# Patient Record
Sex: Male | Born: 1950
Health system: Southern US, Community
[De-identification: ages and names within clinical notes are randomized; demographics above are authoritative.]

## PROBLEM LIST (undated history)

## (undated) DIAGNOSIS — E7800 Pure hypercholesterolemia, unspecified: Secondary | ICD-10-CM

## (undated) DIAGNOSIS — Z8739 Personal history of other diseases of the musculoskeletal system and connective tissue: Secondary | ICD-10-CM

## (undated) DIAGNOSIS — B019 Varicella without complication: Secondary | ICD-10-CM

## (undated) DIAGNOSIS — K219 Gastro-esophageal reflux disease without esophagitis: Secondary | ICD-10-CM

## (undated) DIAGNOSIS — Z872 Personal history of diseases of the skin and subcutaneous tissue: Secondary | ICD-10-CM

## (undated) DIAGNOSIS — M1990 Unspecified osteoarthritis, unspecified site: Secondary | ICD-10-CM

## (undated) DIAGNOSIS — C4490 Unspecified malignant neoplasm of skin, unspecified: Secondary | ICD-10-CM

## (undated) DIAGNOSIS — I2510 Atherosclerotic heart disease of native coronary artery without angina pectoris: Secondary | ICD-10-CM

## (undated) DIAGNOSIS — L4050 Arthropathic psoriasis, unspecified: Secondary | ICD-10-CM

## (undated) DIAGNOSIS — G4733 Obstructive sleep apnea (adult) (pediatric): Secondary | ICD-10-CM

## (undated) DIAGNOSIS — I10 Essential (primary) hypertension: Secondary | ICD-10-CM

## (undated) HISTORY — DX: Gastro-esophageal reflux disease without esophagitis: K21.9

## (undated) HISTORY — DX: Obstructive sleep apnea (adult) (pediatric): G47.33

## (undated) HISTORY — DX: Unspecified malignant neoplasm of skin, unspecified: C44.90

## (undated) HISTORY — PX: OTHER SURGICAL HISTORY: SHX169

## (undated) HISTORY — DX: Unspecified osteoarthritis, unspecified site: M19.90

## (undated) HISTORY — DX: Pure hypercholesterolemia, unspecified: E78.00

## (undated) HISTORY — DX: Varicella without complication: B01.9

## (undated) HISTORY — DX: Personal history of other diseases of the musculoskeletal system and connective tissue: Z87.39

## (undated) HISTORY — DX: Atherosclerotic heart disease of native coronary artery without angina pectoris: I25.10

## (undated) HISTORY — DX: Personal history of diseases of the skin and subcutaneous tissue: Z87.2

## (undated) HISTORY — PX: COLONOSCOPY: SHX174

---
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
## 2003-09-21 HISTORY — PX: CORONARY ARTERY BYPASS GRAFT: SHX141

## 2003-09-21 HISTORY — PX: CARDIAC CATHETERIZATION: SHX172

## 2003-10-05 ENCOUNTER — Ambulatory Visit (HOSPITAL_COMMUNITY): Admission: RE | Admit: 2003-10-05 | Discharge: 2003-10-05 | Payer: Self-pay | Admitting: Cardiology

## 2003-10-12 ENCOUNTER — Ambulatory Visit (HOSPITAL_COMMUNITY): Admission: RE | Admit: 2003-10-12 | Discharge: 2003-10-12 | Payer: Self-pay | Admitting: Surgery

## 2003-10-21 ENCOUNTER — Inpatient Hospital Stay (HOSPITAL_COMMUNITY): Admission: RE | Admit: 2003-10-21 | Discharge: 2003-10-25 | Payer: Self-pay | Admitting: Surgery

## 2003-11-15 ENCOUNTER — Encounter (HOSPITAL_COMMUNITY): Admission: RE | Admit: 2003-11-15 | Discharge: 2004-02-13 | Payer: Self-pay | Admitting: Cardiology

## 2004-02-14 ENCOUNTER — Encounter (HOSPITAL_COMMUNITY): Admission: RE | Admit: 2004-02-14 | Discharge: 2004-05-14 | Payer: Self-pay | Admitting: Cardiology

## 2005-01-25 ENCOUNTER — Encounter: Payer: Self-pay | Admitting: Cardiology

## 2009-04-12 ENCOUNTER — Encounter: Payer: Self-pay | Admitting: Cardiology

## 2009-06-12 ENCOUNTER — Emergency Department (HOSPITAL_COMMUNITY): Admission: EM | Admit: 2009-06-12 | Discharge: 2009-06-12 | Payer: Self-pay | Admitting: Emergency Medicine

## 2010-04-10 ENCOUNTER — Ambulatory Visit: Payer: Self-pay | Admitting: Cardiology

## 2010-10-25 LAB — COMPREHENSIVE METABOLIC PANEL
ALT: 23 U/L (ref 0–53)
Albumin: 3.9 g/dL (ref 3.5–5.2)
Calcium: 9.4 mg/dL (ref 8.4–10.5)
GFR calc Af Amer: >60 mL/min (ref >60)
Glucose, Bld: 114 mg/dL — ABNORMAL HIGH (ref 70–99)
Sodium: 139 mEq/L (ref 135–145)
Total Protein: 6.9 g/dL (ref 6.0–8.3)

## 2010-10-25 LAB — CBC
Hemoglobin: 14.3 g/dL (ref 13.0–17.0)
MCHC: 34.4 g/dL (ref 30.0–36.0)
Platelets: 134 10*3/uL — ABNORMAL LOW (ref 150–400)
RDW: 13.2 % (ref 11.5–15.5)

## 2010-10-25 LAB — POCT CARDIAC MARKERS: Myoglobin, poc: 72.4 ng/mL (ref 12–200)

## 2010-10-25 LAB — APTT: aPTT: 26 seconds (ref 24–37)

## 2010-10-25 LAB — DIFFERENTIAL
Eosinophils Absolute: 0 10*3/uL (ref 0.0–0.7)
Lymphs Abs: 0.4 10*3/uL — ABNORMAL LOW (ref 0.7–4.0)
Monocytes Relative: 6 % (ref 3–12)
Neutrophils Relative %: 88 % — ABNORMAL HIGH (ref 43–77)

## 2010-10-25 LAB — PROTIME-INR: INR: 1.08 (ref 0.00–1.49)

## 2010-12-06 ENCOUNTER — Other Ambulatory Visit: Payer: Self-pay | Admitting: Cardiology

## 2010-12-06 NOTE — Telephone Encounter (Signed)
escribe medication per fax request  

## 2010-12-08 NOTE — Cardiovascular Report (Signed)
NAMEZAVIER, [name] [name] NO.:  [id number]   MEDICAL RECORD NO.:  [id number]                   PATIENT TYPE:  OIB   LOCATION:  2899                                 FACILITY:  [facility]   PHYSICIAN:  Peter M. Swaziland, M.D.               DATE OF BIRTH:  [birth year]   DATE OF PROCEDURE:  10/06/2003  DATE OF DISCHARGE:  10/05/2003                              CARDIAC CATHETERIZATION   PROCEDURE:  Left heart catheterization coronary and left ventricular  angiography.   INDICATION FOR PROCEDURE:  A 60 year old white male with recent onset of  exertional angina.  Stress Cardiolite study is significantly abnormal with  anterior lateral and inferior lateral ischemia.   ACCESS:  Via the right femoral artery using standard Seldinger technique.   EQUIPMENT:  6 French 4 cm right and left Judkins catheter, 6 French pigtail  catheter, 6 French arterial sheath.   MEDICATIONS:  Local anesthesia with 1% Xylocaine.   CONTRAST:  130 mL of Omnipaque.   HEMODYNAMIC DATA:  Aortic pressure 144/86 with mean of 113.  Left  ventricular pressure was 145 with EDP of 12 mmHg.   ANGIOGRAPHIC DATA:  The left coronary artery arises and distributes  normally.  The left main coronary artery is normal.   The left anterior descending artery is a large vessel which has a complex  90% stenosis in the proximal vessel involving the bifurcation with large  first diagonal branch.  The remainder of the LAD is without significant  disease.  The large diagonal branch has 90% ostial stenosis at the  bifurcation of the LAD.   The left circumflex coronary artery gives rise to two marginal vessels.  The  first marginal vessel has a 95% stenosis proximally, also involving the  ostium.  In the ongoing left circumflex, there is a 70% stenosis followed by  an 80-90% stenosis before the last obtuse marginal vessel.   The right coronary artery arises and distributes normally as a dominant  vessel.  The  proximal and mid vessel without significant disease.  The  posterior descending artery has 50% stenosis proximally.  The posterior  lateral branch has segmental 60-70% disease.   LEFT VENTRICULAR ANGIOGRAPHY:  Performed in the RAO view demonstrates normal  left ventricular size and contractility with normal systolic function.  Ejection fraction was estimated at 65%.  There was no mitral regurgitation  or prolapse.  The aortic valve appears normal.   FINAL INTERPRETATION:  1. Severe three-vessel obstructive atherosclerotic coronary artery disease.  2. Normal left ventricular function.   PLAN:  Recommend coronary artery bypass surgery.                                               Peter M. Swaziland, M.D.    PMJ/MEDQ  D:  10/06/2003  T:  10/07/2003  Job:  [id number]   cc:   Gloriajean Dell. Andrey Campanile, M.D.  [address]  [place]  [place] [address]  Fax: [phone]

## 2010-12-08 NOTE — Op Note (Signed)
NAME:  [name] [name], [name] [name]                         ACCOUNT NO.:  [id number]   MEDICAL RECORD NO.:  [id number]                   PATIENT TYPE:  INP   LOCATION:  2304                                 FACILITY:  [facility]   PHYSICIAN:  Evelene Croon, M.D.                  DATE OF BIRTH:  12-21-1950   DATE OF PROCEDURE:  10/21/2003  DATE OF DISCHARGE:                                 OPERATIVE REPORT   PREOPERATIVE DIAGNOSES:  Severe three-vessel coronary artery disease.   POSTOPERATIVE DIAGNOSES:  Severe three-vessel coronary artery disease.   OPERATION PERFORMED:  Median sternotomy, extracorporeal circulation,  coronary artery bypass grafting surgery times five using a left internal  mammary artery graft to the left anterior descending coronary artery, with a  saphenous vein graft to the diagonal branch of the left anterior descending,  a sequential saphenous vein graft to the obtuse marginal and distal left  circumflex coronary arteries and a saphenous vein graft to the posterior  descending coronary artery.  Endoscopic vein harvesting from the right leg.   SURGEON:  Alleen Borne, M.D.   ASSISTANT:  Jerold Coombe, P.A.   ANESTHESIA:  General endotracheal.   INDICATIONS FOR PROCEDURE:  The patient is a 61 year old gentleman who  presented with shortness of breath as well as some exertional chest pain.  A  recent stress Cardiolite exam showed anterolateral ST depression as well as  anterolateral and inferolateral ischemia with ejection fraction of 56%.  Cardiac catheterization on March 17 by Peter M. Swaziland, M.D. showed the LAD  to be a large vessel with a complex 90% stenosis in the proximal portion  involving the bifurcation of the large diagonal branch.  The remainder of  the LAD was without significant disease.  The large diagonal branch had 90%  ostial stenosis.  Left circumflex gave rise to two marginal branches.  The  first one had about 95% proximal stenosis.  The ongoing left  circumflex had  70% stenosis followed by 80 to 90% stenosis before the distal left  circumflex or last marginal branch.  The right coronary artery had a large  posterior descending branch that had about 50% proximal stenosis.  There was  a small posterolateral branch that had 60 to 70% segmental stenosis.  Ejection fraction was about 55% with no mitral regurgitation.  I discussed  the operative procedure of coronary artery bypass surgery with the patient  and his wife in detail in the office.  We discussed alternatives to surgery,  benefits and risks  including bleeding, possible blood transfusion,  infection, stroke, myocardial infarction, graft failure and death.  They  seemed very well informed and had done a lot of research before talking to  me.  They understood and agreed to proceed with surgery.   DESCRIPTION OF PROCEDURE:  The patient was taken to the operating room and  placed on the table in supine position.  After induction of general  endotracheal anesthesia, a Foley catheter was placed in the bladder using  sterile technique.  Then the chest, abdomen and both lower extremities were  prepped and draped in the usual sterile manner.  The chest was entered  through a median sternotomy incision and the pericardium opened in the  midline.  Examination of the heart showed good ventricular contractility.  The ascending aorta had no palpable plaques in it.   Then the left internal mammary artery was harvested from the chest wall as a  pedicle graft.  This was a medium caliber vessel with excellent blood flow  through it.  At the same time, a segment of greater saphenous vein was  harvested from the right leg and this vein was of medium size and good  quality.   Then the patient was heparinized and when an adequate activated clotting  time was achieved, the distal ascending aorta was cannulated using a 20  French aortic cannula for arterial inflow.  Venous outflow was achieved   using a two-stage venous cannula for the right atrial appendage.  An  antegrade cardioplegia and vent cannula was inserted in the aortic root.   The patient was placed on cardiopulmonary bypass and the distal coronary  arteries identified.  The LAD was a large graftable vessel.  The diagonal  branch was a large graftable vessel that was diffusely diseased.  The first  marginal branch was a medium-sized graftable vessel.  The distal left  circumflex was also a medium-sized graftable vessel.  The posterior  descending was  medium-sized graftable vessel.  The posterolateral was tiny.   Then the aorta was crossclamped and 500 mL of cold blood antegrade  cardioplegia was administered in the aortic root with quick arrest of the  heart.  Systemic hypothermia to 20 degrees centigrade and topical  hypothermia with iced saline was used.  A temperature probe was placed on  the septum and insulating pad in the pericardium.   The first distal anastomosis was performed to the posterior descending  coronary artery.  The internal diameter in this vessel was about 1.6 mm.  The conduit used was a segment of greater saphenous vein.  The anastomosis  was performed in a end-to-side manner using continuous 7-0 Prolene suture.  Flow was measured through the graft and was excellent.   The second distal anastomosis was performed to the first marginal branch.  The internal diameter was about 1.6 mm.  The conduit used was a second  segment of greater saphenous vein.  The anastomosis was performed in a  sequential side-to-side manner using continuous 7-0 Prolene suture.  Flow  was measured through the graft and was excellent.   The third distal anastomosis was performed to the distal left circumflex.  The internal diameter was about 1.5 mm.  The conduit used was the same  segment of greater saphenous vein and the anastomosis performed a sequential end-to-side manner using continuous 7-0 Prolene suture.  Flow was  measured  through the graft and was excellent.  Then a dose of cardioplegia was given  down the vein graft and in the aortic root.   The fourth distal anastomosis was performed to the diagonal branch.  The  internal diameter was about 1.6 mm.  The conduit used was a third segment of  the greater saphenous vein.  The anastomosis was performed in a end-to-side  manner using continuous 7-0 Prolene suture.  Flow was measured through the  graft and was excellent.  The fifth distal anastomosis was performed to the midportion of the left  anterior descending coronary artery.  The internal diameter was about 2 mm.  The conduit used was the left internal mammary artery graft and this was  brought through an opening in the left pericardium anterior to the phrenic  nerve.  It was anastomosed to the LAD in end-to-side manner using continuous  8-0 Prolene suture.  The pedicle was tacked to the cardium with 6-0 Prolene  sutures.  The patient was rewarmed to 37 degrees centigrade and the clamp  removed from the mammary pedicle.  There was rapid warming of the  ventricular septum and return of spontaneous ventricular fibrillation.  The  crossclamp was removed with time of 69 minutes and the patient defibrillated  into sinus rhythm.   A partial occlusion clamp was placed on the aortic root and the three  proximal vein graft anastomoses were performed in end-to-side manner using  continuous 6-0 Prolene suture.  The clamp was removed, the vein grafts  deaired and the clamps removed from them.  The proximal and distal  anastomoses appeared hemostatic and the line of the graft satisfactory.  Graft markers were placed around the proximal anastomoses.  Two temporary  right ventricular and right atrial pacing wires placed and brought out  through the skin.   When the patient had rewarmed to 37 degrees centigrade, he was weaned from  cardiopulmonary bypass on no inotropic agents.  Total bypass time was 115   minutes.  Cardiac function appeared excellent with a cardiac output of 5L  per minute.  Protamine was given and the venous and aortic cannulas were  removed without difficulty.  Hemostasis was achieved.  Three chest tubes  were placed with a tube in the posterior pericardium, one in the left  pleural space and one in the anterior mediastinum.  The pericardium was  loosely approximated over the heart.  The sternum was closed with #6  stainless steel wires.  The fascia was closed with continuous #1 Vicryl  suture.  The subcutaneous tissue was closed with continuous 2-0 Vicryl and  the skin with 3-0 Vicryl subcuticular closure.  The lower extremity vein  harvest site was closed in layers in a similar manner.  Sponge, needle and  instrument counts were correct according to the scrub nurse.  Dry sterile  dressings were applied over the incisions, around the chest tubes which were hooked to Pleur-evac suction.  The patient remained hemodynamically stable  and was transported to the SICU in guarded but stable condition.                                               Evelene Croon, M.D.    BB/MEDQ  D:  10/21/2003  T:  10/22/2003  Job:  [id number]   cc:   Peter M. Swaziland, M.D.  [address]  [place], [place] [address]  Fax: [phone]   Cath lab

## 2010-12-08 NOTE — Discharge Summary (Signed)
NAME:  MAYCOL, HOYING                         ACCOUNT NO.:  [id number]   MEDICAL RECORD NO.:  [id number]                   PATIENT TYPE:  INP   LOCATION:  2039                                 FACILITY:  [facility]   PHYSICIAN:  Evelene Croon, M.D.                  DATE OF BIRTH:  [birth year]   DATE OF ADMISSION:  10/21/2003  DATE OF DISCHARGE:  10/25/2003                                 DISCHARGE SUMMARY   Patient's cardiologist is Dr. Peter M. Swaziland.  His primary care physician  is Dr. Gloriajean Dell. Wilson.   ADMISSION DIAGNOSIS:  Severe three-vessel coronary artery disease with  ejection fraction of 65%.   DISCHARGE/SECONDARY DIAGNOSES:  1. Severe three-vessel coronary artery disease with ejection fraction of     65%, status post coronary artery bypass graft times five.  2. Hyperlipidemia, on Lipitor.  3. History of psoriasis.  4. History of gout.  5. Remote history of tonsillectomy.   PROCEDURES:  On October 21, 2003, Mr. Mceachern underwent coronary artery bypass  grafting times five, using the left internal mammary artery graft to the  left anterior descending coronary artery, saphenous vein graft to the  diagonal branch to the left anterior descending, sequential saphenous vein  graft to the obtuse marginal and distal left circumflex coronary artery, and  saphenous vein graft to the posterior descending coronary artery.  Vein  harvesting was from the right leg.  Surgeon was Dr. Evelene Croon.   ALLERGIES:  He has no known drug allergies.   BRIEF HISTORY:  Mr. Crean is a 60 year old Caucasian male who was referred  to Dr. Laneta Simmers by Dr. Peter Swaziland.  Apparently he had presented to Dr.  Swaziland with a history of exertional chest pain, mild fatigue and shortness  of breath since November of 2004.  He initially developed chest pressure  while on a bicycle trip and did not think much of it.  However, he had been  going to the gym and has continued to have substernal chest pressure with  exertion.  A Cardiolite exam was then done, which showed anterior lateral ST  segment depression.  Cardiolite images showed anterior lateral and inferior  lateral ischemia with an ejection fraction of 65%.  He underwent cardiac  catheterization on October 07, 2003.  The LAD was a large vessel that had  complex 90% stenosis in the proximal portion involving the bifurcation of  the large diagonal branch.  The remainder of the LAD was without significant  disease.  The large diagonal branch had 90% ostial stenosis.  The left  circumflex gave rise to two large marginal branches.  The first one had 95%  proximal stenosis.  The on-going left circumflex had 70% stenosis followed  by an 80-90% stenosis before the last obtuse marginal branch.  The right  coronary artery had a posterior descending branch that had about 50%  proximal stenosis.  There was  a small posterior lateral branch that had 60-  70% segmental stenosis.  The left ventricular function on this exam was  normal with an ejection fraction of 65%.  There was no mitral regurgitation.  After examination of the patient and review of his cardiac catheterization  results, Dr. Laneta Simmers did feel that coronary artery bypass grafting surgery  was the best option.  After discussing risks, benefits and alternatives with  the patient, he did agree to proceed and this was tentatively scheduled at  [facility] for October 21, 2003.   HOSPITAL COURSE:  On October 21, 2003, Mr. Radney was electively admitted to  [facility] and did undergo coronary artery bypass grafting as  discussed above.  He tolerated the procedure well and was transferred in  stable condition from the operating room to the surgical intensive care  unit.   On the morning of postoperative day one, Mr. Dowis was hemodynamically  stable.  He was afebrile and in sinus rhythm.  His chest x-ray was within  normal limits.  EKG also showed normal sinus rhythm with no acute  changes.  His postoperative labs were stable.  By this time he had also been extubated  and neurologically intact.  His mediastinal chest tubes had minimal output  and were discontinued later that day without incident.  His left pleural  tube remained until the following morning.  He was also transferred on  postoperative day one out of the intensive care unit to the floor.  Diuretic  therapy was also started for mild fluid volume overload.   Over the next several days Mr. Zeitlin continued to progress.  He maintained  sinus rhythm and was afebrile.  On an ACE inhibitor and beta blocker his  systolic blood pressure ranged between 100 and 130 over 60s to 70s.  His  urine output was also adequate and he was tolerating an oral diet.  His pain  was managed with oral medication.  He was also ambulating independently.   On postoperative day three Mr. Kartes continued to progress.  He did report  a mild episode of shortness of breath during the night and also reported  mild dyspnea on exertion.  He did report that his breathing did feel  improved on supplemental oxygen and had been resumed overnight.  Continued  pulmonary toilet and ambulation was encouraged.  On exam his lungs were  clear, but slightly diminished in the bases.  His chest x-ray from two days  prior showed no pneumothorax with left base atelectasis.  Due to his slight  shortness of breath a follow up chest x-ray was ordered for the following  day.  In addition, the patient also reported problems with constipation.  He  was passing gas.  Colace and Dulcolax were given and milk of magnesia was  written as needed.   Due to Mr. Mondesir's steady progression, it was felt that he would be stable  for discharge on postoperative day four, October 25, 2003.  Final discharge  orders will be written pending the patient is saturating above 90% on room  air, his follow up chest x-ray results remain stable, and his constipation resolves.    RECENT LABORATORY DATA:  On October 23, 2003, his white blood count was 13.9,  hemoglobin 9.9, hematocrit 28.7, platelet count 170, sodium 141, potassium  3.9, BUN 14, creatinine 1.1, blood glucose 134.   DISCHARGE INSTRUCTIONS:   DISCHARGE MEDICATIONS:  1. Enteric-coated aspirin 325 mg one tablet p.o. q.d.  2. Toprol XL 25 mg one p.o. q. d.  3. Altace 2.5 mg one p.o. q.d.  4. Lipitor 10 mg one p.o. q.d.  5. Lasix 40 mg one p.o. q.d. times 4 days.  6. K-Dur 20 mEq one p.o. q.d. times 4 days.  7. Laxative of choice p.r.n. constipation.  8. Tylox one to two tablets p.o. q.4-6h p.r.n. pain.   ACTIVITY:  He is instructed to avoid driving or heavy lifting of more than  ten pounds.  He is encouraged to continue daily walking and breathing  exercises.   DIET:  He is to follow a low-fat, low-salt diet.   WOUND CARE:  Prior to discharge his sternal incision and right lower  extremity incisions were clean and dry without erythema.  After discharge he  was instructed that he may shower.  He may clean his incisions daily with  mild soap and water.  He is to notify the [facility] office if he develops fever  greater than 101 or redness or drainage from his incision sites.   FOLLOW UP:  1. He is to follow up with Dr. Laneta Simmers at the [facility] office in approximately     three weeks.  The office has been contacted and will contact him with a     specific appointment date and time.  He has been instructed to bring his     follow up chest x-ray with him to this appointment.  2. He is to follow up with Dr. Peter Swaziland in approximately two weeks.  He     is to call [phone] to schedule this appointment.  He will have a chest x-     ray done at that time.      Jerold Coombe, P.A.                  Evelene Croon, M.D.    AWZ/MEDQ  D:  10/24/2003  T:  10/25/2003  Job:  [id number]   cc:   Evelene Croon, M.D.  [address]  [place]  [place] [address]  Fax: [phone]   Peter M. Swaziland, M.D.  [address]  [place], [place] [address]  Fax: [phone]   Benedetto Goad, M.D.  [facility]  [place]

## 2010-12-08 NOTE — H&P (Signed)
NAME:  [name] [name], SEPTER NO.:  [id number]   MEDICAL RECORD NO.:  [id number]                   PATIENT TYPE:  OIB   LOCATION:                                       FACILITY:  [facility]   PHYSICIAN:  Peter M. Swaziland, M.D.               DATE OF BIRTH:  January 15, 1951   DATE OF ADMISSION:  10/05/2003  DATE OF DISCHARGE:                                HISTORY & PHYSICAL   HISTORY OF PRESENT ILLNESS:  Mr. Watkinson is a 60 year old white male with a 6-  month history of exertional chest pain, fatigue and some mild shortness of  breath.  His symptoms were relieved with rest.  He does have a history of  hypercholesterolemia.  He underwent a stress Cardiolite study on September 23, 2003.  He walked 8 minutes and 15 seconds on the Bruce protocol with typical  anginal symptoms and 2 mm of inferolateral ST segment depression.  Subsequent Cardiolite images showed anterolateral and inferolateral ischemia  with ejection fraction of 56%.  He is now admitted for coronary angiography.   PAST MEDICAL HISTORY:  1. Significant for gout.  2. Hypercholesterolemia.  3. Previous T&A.   ALLERGIES:  He has no known allergies.   CURRENT MEDICATIONS:  1. Aspirin daily.  2. Lipitor 20 mg daily.  3. Toprol XL 50 mg daily.  4. Plavix 75 mg daily.   SOCIAL HISTORY:  The patient is married, he has one child.  He is self-  employed in [employer].  He quit smoking 30 years ago.  He drinks  3 to 4 alcoholic beverages per week.   FAMILY HISTORY:  Father is age 42, in good health.  Mother is age 9 and has  had previous bypass surgery.  Has one brother who is alive and well.   REVIEW OF SYSTEMS:  He denies any claudication symptoms.  He has had no  history of transient ischemic attack or stroke.  No history of kidney  disease.  No recent bowel or bladder complaints.  Other review of systems  are negative.   PHYSICAL EXAMINATION:  GENERAL:  The patient is a well-developed white male  in no apparent distress.  VITAL SIGNS:  Blood pressure 150/100, pulse 68 and regular, respirations 20.  His weight is 212 pounds.  HEENT:  Normocephalic, atraumatic.  Pupils are equal, round and reactive to  light and accommodation.  Extraocular movements are full.  Oropharynx is  clear.  NECK:  Supple without JVD, adenopathy, thyromegaly or bruits.  LUNGS:  Clear to auscultation and percussion.  CARDIAC:  Exam reveals regular rate and rhythm, normal S1 and S2 without  gallops, murmurs, rubs or clicks.  ABDOMEN:  Soft, nontender, there is no hepatosplenomegaly, masses or bruits.  EXTREMITIES:  Femoral and pedal pulses are 2+ and symmetric.  He has no  lower extremity edema.  SKIN:  Warm and dry.  NEUROLOGICAL EXAM:  Nonfocal.   LABORATORY DATA:  Resting ECG is normal.  Coagulation studies:  CBC, CMET,  thyroid function studies are all normal.  PSA is 0.5.  Cholesterol is 234,  LDL 167, HDL 55, triglycerides 58.   IMPRESSION:  1. Exertional angina with abnormal stress Cardiolite study.  2. Hypercholesterolemia.  3. Gout.   PLAN:  Will admit for cardiac catheterization and possible intervention.                                                Peter M. Swaziland, M.D.    PMJ/MEDQ  D:  09/26/2003  T:  09/27/2003  Job:  [id number]   cc:   Gloriajean Dell. Andrey Campanile, M.D.  [address]  [place]  [place] [address]  Fax: [phone]

## 2011-01-05 ENCOUNTER — Other Ambulatory Visit: Payer: Self-pay | Admitting: Cardiology

## 2011-02-10 ENCOUNTER — Other Ambulatory Visit: Payer: Self-pay | Admitting: Cardiology

## 2011-02-12 NOTE — Telephone Encounter (Signed)
escribe medication per fax request  

## 2011-06-29 ENCOUNTER — Encounter: Payer: Self-pay | Admitting: Cardiology

## 2011-06-29 ENCOUNTER — Ambulatory Visit (INDEPENDENT_AMBULATORY_CARE_PROVIDER_SITE_OTHER): Payer: BC Managed Care – PPO | Admitting: Cardiology

## 2011-06-29 ENCOUNTER — Other Ambulatory Visit (INDEPENDENT_AMBULATORY_CARE_PROVIDER_SITE_OTHER): Payer: BC Managed Care – PPO | Admitting: *Deleted

## 2011-06-29 ENCOUNTER — Other Ambulatory Visit: Payer: Self-pay | Admitting: *Deleted

## 2011-06-29 VITALS — BP 136/82 | HR 56 | Ht 74.0 in | Wt 215.0 lb

## 2011-06-29 DIAGNOSIS — E785 Hyperlipidemia, unspecified: Secondary | ICD-10-CM

## 2011-06-29 DIAGNOSIS — I2510 Atherosclerotic heart disease of native coronary artery without angina pectoris: Secondary | ICD-10-CM

## 2011-06-29 DIAGNOSIS — Z951 Presence of aortocoronary bypass graft: Secondary | ICD-10-CM

## 2011-06-29 DIAGNOSIS — E7800 Pure hypercholesterolemia, unspecified: Secondary | ICD-10-CM

## 2011-06-29 LAB — BASIC METABOLIC PANEL
GFR: 76.7 mL/min (ref >60.00)
Potassium: 4.3 mEq/L (ref 3.5–5.1)
Sodium: 140 mEq/L (ref 135–145)

## 2011-06-29 LAB — LIPID PANEL
Cholesterol: 154 mg/dL (ref 0–200)
HDL: 66.5 mg/dL (ref >39.00)
Triglycerides: 48 mg/dL (ref 0.0–149.0)
VLDL: 9.6 mg/dL (ref 0.0–40.0)

## 2011-06-29 LAB — HEPATIC FUNCTION PANEL
ALT: 31 U/L (ref 0–53)
Total Bilirubin: 1.2 mg/dL (ref 0.3–1.2)
Total Protein: 6.6 g/dL (ref 6.0–8.3)

## 2011-06-29 NOTE — Assessment & Plan Note (Signed)
He had a stress echo in September of 2010 which was normal. We will continue with risk factor modification and followup again in one year.

## 2011-06-29 NOTE — Progress Notes (Signed)
[name] [name] Date of Birth: 1951-07-23 Medical Record #[id number]  History of Present Illness: [name] [name] is seen today for yearly followup. He reports that he has done very well this year. He remains active walking and riding bikes. He denies any chest pain or shortness of breath. He's had no palpitations. He is status post CABG in 2005 and had his last stress test in September of 2010.  Current Outpatient Prescriptions on File Prior to Visit  Medication Sig Dispense Refill  . cholecalciferol (VITAMIN D) 1000 UNITS tablet Take 1,000 Units by mouth daily.        Marland Kitchen LIPITOR 40 MG tablet TAKE 1 TABLET BY MOUTH EVERY DAY  90 tablet  3  . metoprolol succinate (TOPROL-XL) 25 MG 24 hr tablet TAKE 1 TABLET EVERY DAY  90 tablet  3  . multivitamin (THERAGRAN) per tablet Take 1 tablet by mouth daily.        . nitroGLYCERIN (NITROSTAT) 0.4 MG SL tablet PLACE 1 TABLET UNDER THE TONGUE AS NEEDED  25 tablet  6  . Omega-3 Fatty Acids (FISH OIL CONCENTRATE PO) Take 1,700 mg by mouth daily.        . ramipril (ALTACE) 2.5 MG capsule TAKE 1 CAPSULE EVERY DAY  90 capsule  3    No Known Allergies  Past Medical History  Diagnosis Date  . Coronary artery disease     Severe three-vessel coronary artery disease with ejection fraction of 65%  . Hyperlipidemia   . History of psoriasis   . History of gout   . SOB (shortness of breath)     Mild  . Hypercholesterolemia     Past Surgical History  Procedure Date  . Tonsillectomy     Remote history of tonsillectomy.  . Cardiac catheterization 09/2003    Ejection fraction was estimated at 65%.  . Coronary artery bypass graft     Lima-lad,svg-diag,svg-om/distal LCX,svg-pda    History  Smoking status  . Former Smoker  Smokeless tobacco  . Not on file    History  Alcohol Use  . Yes    Family History  Problem Relation Age of Onset  . Heart disease Mother     cabg    Review of Systems: As noted in history of present illness.  All other  systems were reviewed and are negative.  Physical Exam: BP 136/82  Pulse 56  Ht 6\' 2"  (1.88 m)  Wt 215 lb (97.523 kg)  BMI 27.60 kg/m2 The patient is alert and oriented x 3.  The mood and affect are normal.  The skin is warm and dry.  Color is normal.  The HEENT exam reveals that the sclera are nonicteric.  The mucous membranes are moist.  The carotids are 2+ without bruits.  There is no thyromegaly.  There is no JVD.  The lungs are clear.  The chest wall is non tender.  The heart exam reveals a regular rate with a normal S1 and S2.  There are no murmurs, gallops, or rubs.  The PMI is not displaced.   Abdominal exam reveals good bowel sounds.  There is no guarding or rebound.  There is no hepatosplenomegaly or tenderness.  There are no masses.  Exam of the legs reveal no clubbing, cyanosis, or edema.  The legs are without rashes.  The distal pulses are intact.  Cranial nerves II - XII are intact.  Motor and sensory functions are intact.  The gait is normal.   LABORATORY DATA: ECG  today demonstrates sinus bradycardia with a rate of 56 beats per minute. It is otherwise normal.  Assessment / Plan:

## 2011-06-29 NOTE — Patient Instructions (Signed)
Continue your current therapy.   We will call with the results of your lab work today.  I will see you again in 1 year.  

## 2011-06-29 NOTE — Assessment & Plan Note (Signed)
He remains on therapy with Lipitor and fish oil. We will followup fasting lab work today including chemistries and a lipid panel.

## 2011-07-02 ENCOUNTER — Encounter: Payer: Self-pay | Admitting: *Deleted

## 2011-07-04 ENCOUNTER — Telehealth: Payer: Self-pay | Admitting: *Deleted

## 2011-07-04 NOTE — Telephone Encounter (Signed)
Message copied by Lorayne Bender on Wed Jul 04, 2011  5:01 PM ------      Message from: [place], PETER M      Created: Fri Jun 29, 2011  3:40 PM       Chemistries are all normal. Lipids look very good.      Theron Arista [place]

## 2011-07-04 NOTE — Telephone Encounter (Signed)
Notified of lab results. Will send copy to Dr. Benedetto Goad

## 2011-09-14 ENCOUNTER — Telehealth: Payer: Self-pay | Admitting: Cardiology

## 2011-09-14 NOTE — Telephone Encounter (Signed)
New Problem   Patient request call from nurse concerning stress test, he can be reached at [phone] mobile #

## 2011-09-14 NOTE — Telephone Encounter (Signed)
Patient called stating his insurance is going to change 12/22/11 and would like to set up stress test before it changes.

## 2011-09-14 NOTE — Telephone Encounter (Signed)
Patient called no answer.LMTC. 

## 2011-09-18 ENCOUNTER — Other Ambulatory Visit: Payer: Self-pay

## 2011-09-18 DIAGNOSIS — I2510 Atherosclerotic heart disease of native coronary artery without angina pectoris: Secondary | ICD-10-CM

## 2011-09-18 NOTE — Telephone Encounter (Signed)
Patient called was told okay with Dr.Jordan to schedule stress echo first of 5/13.Schedulers will be calling him to schedule.

## 2011-09-19 ENCOUNTER — Telehealth: Payer: Self-pay | Admitting: *Deleted

## 2011-09-19 NOTE — Telephone Encounter (Signed)
Left message for patient to call the office to scheduled an appt.Per Dr. Swaz[place] 09/19/11.

## 2011-10-01 ENCOUNTER — Encounter: Payer: Self-pay | Admitting: *Deleted

## 2011-11-21 ENCOUNTER — Ambulatory Visit (HOSPITAL_COMMUNITY): Payer: BC Managed Care – PPO | Attending: Cardiology

## 2011-11-21 ENCOUNTER — Encounter: Payer: Self-pay | Admitting: Cardiology

## 2011-11-21 ENCOUNTER — Other Ambulatory Visit (HOSPITAL_COMMUNITY): Payer: BC Managed Care – PPO

## 2011-11-21 ENCOUNTER — Ambulatory Visit (HOSPITAL_BASED_OUTPATIENT_CLINIC_OR_DEPARTMENT_OTHER): Payer: BC Managed Care – PPO

## 2011-11-21 DIAGNOSIS — Z87891 Personal history of nicotine dependence: Secondary | ICD-10-CM | POA: Insufficient documentation

## 2011-11-21 DIAGNOSIS — R0989 Other specified symptoms and signs involving the circulatory and respiratory systems: Secondary | ICD-10-CM

## 2011-11-21 DIAGNOSIS — E785 Hyperlipidemia, unspecified: Secondary | ICD-10-CM | POA: Insufficient documentation

## 2011-11-21 DIAGNOSIS — I2510 Atherosclerotic heart disease of native coronary artery without angina pectoris: Secondary | ICD-10-CM

## 2011-11-26 LAB — HM COLONOSCOPY

## 2011-12-31 ENCOUNTER — Telehealth: Payer: Self-pay | Admitting: *Deleted

## 2011-12-31 MED ORDER — ATORVASTATIN CALCIUM 40 MG PO TABS: 40.0000 mg | ORAL_TABLET | Freq: Every day | ORAL | Status: DC

## 2012-01-01 NOTE — Telephone Encounter (Signed)
Filled rx atorvastatin (LIPITOR) 40 MG tablet

## 2012-05-29 ENCOUNTER — Encounter: Payer: Self-pay | Admitting: Cardiology

## 2012-06-10 ENCOUNTER — Other Ambulatory Visit: Payer: Self-pay | Admitting: *Deleted

## 2012-06-10 MED ORDER — NITROGLYCERIN 0.4 MG SL SUBL: 0.4000 mg | SUBLINGUAL_TABLET | SUBLINGUAL | Status: DC | PRN

## 2012-07-08 ENCOUNTER — Other Ambulatory Visit: Payer: Self-pay | Admitting: Cardiology

## 2012-07-08 ENCOUNTER — Other Ambulatory Visit: Payer: Self-pay | Admitting: *Deleted

## 2012-07-08 MED ORDER — METOPROLOL SUCCINATE ER 25 MG PO TB24: 25.0000 mg | ORAL_TABLET | Freq: Every day | ORAL | Status: DC

## 2012-07-08 MED ORDER — RAMIPRIL 2.5 MG PO CAPS: 2.5000 mg | ORAL_CAPSULE | Freq: Every day | ORAL | Status: DC

## 2012-08-19 ENCOUNTER — Telehealth: Payer: Self-pay | Admitting: Cardiology

## 2012-08-19 NOTE — Telephone Encounter (Signed)
Pt  needs auth for lipitor called to [phone],  pls call pt when done [phone]

## 2012-08-21 MED ORDER — ATORVASTATIN CALCIUM 40 MG PO TABS: 40.0000 mg | ORAL_TABLET | Freq: Every day | ORAL | Status: DC

## 2012-08-21 NOTE — Telephone Encounter (Signed)
Returned call to patient was told [facility] called lipitor not covered under your plan.Plan does cover atorvastatin.[facility] [address].

## 2012-09-01 ENCOUNTER — Ambulatory Visit (INDEPENDENT_AMBULATORY_CARE_PROVIDER_SITE_OTHER): Payer: BC Managed Care – PPO | Admitting: Emergency Medicine

## 2012-09-01 VITALS — BP 120/70 | HR 81 | Temp 98.3°F | Resp 18 | Wt 209.0 lb

## 2012-09-01 DIAGNOSIS — J209 Acute bronchitis, unspecified: Secondary | ICD-10-CM

## 2012-09-01 DIAGNOSIS — J018 Other acute sinusitis: Secondary | ICD-10-CM

## 2012-09-01 MED ORDER — ALBUTEROL SULFATE HFA 108 (90 BASE) MCG/ACT IN AERS: 2.0000 | INHALATION_SPRAY | RESPIRATORY_TRACT | Status: DC | PRN

## 2012-09-01 MED ORDER — HYDROCOD POLST-CHLORPHEN POLST 10-8 MG/5ML PO LQCR: 5.0000 mL | Freq: Two times a day (BID) | ORAL | Status: DC | PRN

## 2012-09-01 MED ORDER — AMOXICILLIN-POT CLAVULANATE 875-125 MG PO TABS: 1.0000 | ORAL_TABLET | Freq: Two times a day (BID) | ORAL | Status: DC

## 2012-09-01 NOTE — Progress Notes (Signed)
[facility] [address], [place] [address] [phone]  Date:  09/01/2012   Name:  [name] [name]   DOB:  06/08/1951   MRN:  [id number]  PCP:  Pamelia Hoit, MD    Chief Complaint: URI   History of Present Illness:  [name] [name] is a 62 y.o. very pleasant male patient who presents with the following:  Ill since Friday.  Has a purulent productive cough and nasal congestion and nasal drainage purulent in nature.  Sore throat.  Myalgias and arthralgias and fatigue.  No nausea or vomiting.  No wheezing or shortness of breath.  No improvement with OTC medication.  No flu shot.  Wife ill last week   Patient Active Problem List  Diagnosis  . Coronary artery disease  . Hypercholesterolemia  . S/P CABG (coronary artery bypass graft)    Past Medical History  Diagnosis Date  . Coronary artery disease     Severe three-vessel coronary artery disease with ejection fraction of 65%  . History of psoriasis   . History of gout   . SOB (shortness of breath)     Mild  . Hypercholesterolemia     Past Surgical History  Procedure Laterality Date  . Tonsillectomy      Remote history of tonsillectomy.  . Cardiac catheterization  09/2003    Ejection fraction was estimated at 65%.  . Coronary artery bypass graft      Lima-lad,svg-diag,svg-om/distal LCX,svg-pda    History  Substance Use Topics  . Smoking status: Former Games developer  . Smokeless tobacco: Not on file  . Alcohol Use: Yes    Family History  Problem Relation Age of Onset  . Heart disease Mother     cabg  . Heart disease Father     No Known Allergies  Medication list has been reviewed and updated.  Current Outpatient Prescriptions on File Prior to Visit  Medication Sig Dispense Refill  . atorvastatin (LIPITOR) 40 MG tablet Take 1 tablet (40 mg total) by mouth daily.  90 tablet  3  . cholecalciferol (VITAMIN D) 1000 UNITS tablet Take 1,000 Units by mouth daily.        . metoprolol  succinate (TOPROL-XL) 25 MG 24 hr tablet Take 1 tablet (25 mg total) by mouth daily.  90 tablet  0  . multivitamin (THERAGRAN) per tablet Take 1 tablet by mouth daily.        . nitroGLYCERIN (NITROSTAT) 0.4 MG SL tablet Place 1 tablet (0.4 mg total) under the tongue every 5 (five) minutes as needed for chest pain.  25 tablet  6  . Omega-3 Fatty Acids (FISH OIL CONCENTRATE PO) Take 1,700 mg by mouth daily.        . ramipril (ALTACE) 2.5 MG capsule Take 1 capsule (2.5 mg total) by mouth daily.  30 capsule  1   No current facility-administered medications on file prior to visit.    Review of Systems:  As per HPI, otherwise negative.    Physical Examination: Filed Vitals:   09/01/12 1306  BP: 120/70  Pulse: 81  Temp: 98.3 F (36.8 C)  Resp: 18   Filed Vitals:   09/01/12 1306  Weight: 209 lb (94.802 kg)   Body mass index is 26.82 kg/(m^2). Ideal Body Weight:    GEN: WDWN, NAD, Non-toxic, A & O x 3 HEENT: Atraumatic, Normocephalic. Neck supple. No masses, No LAD. Ears and Nose: No external deformity. CV: RRR, No M/G/R. No JVD. No  thrill. No extra heart sounds. PULM: CTA B, diffuse bilateral wheezes, no crackles, rhonchi. No retractions. No resp. distress. No accessory muscle use. ABD: S, NT, ND, +BS. No rebound. No HSM. EXTR: No c/c/e NEURO Normal gait.  PSYCH: Normally interactive. Conversant. Not depressed or anxious appearing.  Calm demeanor.    Assessment and Plan: Bronchitis with bronchospasm Sinusitis augmentin mucinex d tussionex Albuterol MDI  Carmelina Dane, MD

## 2012-09-01 NOTE — Patient Instructions (Addendum)

## 2012-10-21 ENCOUNTER — Telehealth: Payer: Self-pay | Admitting: Cardiology

## 2012-10-21 NOTE — Telephone Encounter (Signed)
New Prob   Pt has some questions regarding his medications. Would like to speak to nurse.

## 2012-10-21 NOTE — Telephone Encounter (Signed)
Spoke with patient he was wanting to know if his insurance sent a letter in 11/13 or 10/13 saying they would no longer cover lipitor.Patient was told did not receive letter,just received your phone call 08/19/12 saying insurance will only cover atorvastatin.

## 2012-10-21 NOTE — Telephone Encounter (Signed)
Patient called no answer.LMTC. 

## 2012-10-23 ENCOUNTER — Other Ambulatory Visit: Payer: Self-pay

## 2012-10-30 ENCOUNTER — Other Ambulatory Visit: Payer: Self-pay

## 2012-10-30 MED ORDER — RAMIPRIL 2.5 MG PO CAPS: 2.5000 mg | ORAL_CAPSULE | Freq: Every day | ORAL | Status: DC

## 2012-11-06 ENCOUNTER — Telehealth: Payer: Self-pay

## 2012-11-06 MED ORDER — RAMIPRIL 2.5 MG PO CAPS: 2.5000 mg | ORAL_CAPSULE | Freq: Every day | ORAL | Status: DC

## 2012-11-06 NOTE — Telephone Encounter (Signed)
Patient called no answer.Left message to call office to schedule appointment with Dr.Jordan

## 2012-11-06 NOTE — Telephone Encounter (Signed)
Received call from patient appointment scheduled with Dr.Jordan 01/07/13.Patient requested 90 day supply of Ramipril.Prescription sent to pharmacy.

## 2012-11-12 ENCOUNTER — Other Ambulatory Visit: Payer: Self-pay

## 2012-11-12 MED ORDER — RAMIPRIL 2.5 MG PO CAPS: 2.5000 mg | ORAL_CAPSULE | Freq: Every day | ORAL | Status: DC

## 2012-11-12 NOTE — Telephone Encounter (Signed)
Patient Instructions    Continue your current therapy.    We will call with the results of your lab work today.   I will see you again in 1 year.   Chart Reviewed By    Lorayne Bender, RN  on 07/02/2011  5:38 PM        Previous Visit      Provider Department Encounter #    06/26/2011 10:54 AM Peter [place], MD [facility] [id number]

## 2012-12-16 ENCOUNTER — Ambulatory Visit (INDEPENDENT_AMBULATORY_CARE_PROVIDER_SITE_OTHER): Payer: BC Managed Care – PPO | Admitting: Cardiology

## 2012-12-16 ENCOUNTER — Encounter: Payer: Self-pay | Admitting: Cardiology

## 2012-12-16 VITALS — BP 129/69 | HR 60 | Ht 73.0 in | Wt 205.0 lb

## 2012-12-16 DIAGNOSIS — Z951 Presence of aortocoronary bypass graft: Secondary | ICD-10-CM

## 2012-12-16 DIAGNOSIS — I2510 Atherosclerotic heart disease of native coronary artery without angina pectoris: Secondary | ICD-10-CM

## 2012-12-16 DIAGNOSIS — E7800 Pure hypercholesterolemia, unspecified: Secondary | ICD-10-CM

## 2012-12-16 MED ORDER — METOPROLOL SUCCINATE ER 25 MG PO TB24: 25.0000 mg | ORAL_TABLET | Freq: Every day | ORAL | Status: DC

## 2012-12-16 NOTE — Progress Notes (Signed)
Judie Bonus Duchene Date of Birth: 19-Aug-1950 Medical Record #[id number]  History of Present Illness: Mr. Dombkowski is seen today for yearly followup. He is status post CABG in 2005. He had a normal stress echo in May of 2013. He continues to do very well and remains very active. He exercises regularly. He has lost 10 pounds this year. He reports that he had complete lab work at his physical in January. He denies any chest pain, dyspnea, or palpitations.  Current Outpatient Prescriptions on File Prior to Visit  Medication Sig Dispense Refill  . atorvastatin (LIPITOR) 40 MG tablet Take 1 tablet (40 mg total) by mouth daily.  90 tablet  3  . cholecalciferol (VITAMIN D) 1000 UNITS tablet Take 1,000 Units by mouth daily.        . multivitamin (THERAGRAN) per tablet Take 1 tablet by mouth daily.        . nitroGLYCERIN (NITROSTAT) 0.4 MG SL tablet Place 1 tablet (0.4 mg total) under the tongue every 5 (five) minutes as needed for chest pain.  25 tablet  6  . Omega-3 Fatty Acids (FISH OIL CONCENTRATE PO) Take 1,700 mg by mouth daily.        . ramipril (ALTACE) 2.5 MG capsule Take 1 capsule (2.5 mg total) by mouth daily.  90 capsule  0   No current facility-administered medications on file prior to visit.    No Known Allergies  Past Medical History  Diagnosis Date  . Coronary artery disease     Severe three-vessel coronary artery disease with ejection fraction of 65%  . History of psoriasis   . History of gout   . SOB (shortness of breath)     Mild  . Hypercholesterolemia     Past Surgical History  Procedure Laterality Date  . Tonsillectomy      Remote history of tonsillectomy.  . Cardiac catheterization  09/2003    Ejection fraction was estimated at 65%.  . Coronary artery bypass graft      Lima-lad,svg-diag,svg-om/distal LCX,svg-pda    History  Smoking status  . Former Smoker  Smokeless tobacco  . Not on file    History  Alcohol Use  . Yes    Family History  Problem  Relation Age of Onset  . Heart disease Mother     cabg  . Heart disease Father     Review of Systems: As noted in history of present illness.  All other systems were reviewed and are negative.  Physical Exam: BP 129/69  Pulse 60  Ht 6\' 1"  (1.854 m)  Wt 205 lb (92.987 kg)  BMI 27.05 kg/m2 He is a well-developed white male in no acute distress. HEENT exam is unremarkable.  The carotids are 2+ without bruits.  There is no thyromegaly.  There is no JVD.  The lungs are clear.    The heart exam reveals a regular rate with a normal S1 and S2.  There are no murmurs, gallops, or rubs.  The PMI is not displaced.   Abdominal exam reveals good bowel sounds.   There are no masses.  Exam of the legs reveal no clubbing, cyanosis, or edema.  The legs are without rashes.  The distal pulses are intact.  Cranial nerves II - XII are intact.  Motor and sensory functions are intact.  The gait is normal.   LABORATORY DATA: ECG today demonstrates normal sinus rhythm with a rate of 60 beats per minute. It is otherwise normal.  Assessment / Plan:  1. Coronary disease status post CABG in 2005. He is asymptomatic. He had a normal stress echo 1 year ago. We will continue risk factor modification. Continue metoprolol and aspirin therapy.  2. Hyperlipidemia. Have requested a copy of his most recent laboratory data.  3. Hypertension-controlled.

## 2012-12-16 NOTE — Patient Instructions (Addendum)
Continue your current therapy  I will see you in one year   

## 2013-01-07 ENCOUNTER — Ambulatory Visit: Payer: BC Managed Care – PPO | Admitting: Cardiology

## 2013-02-27 ENCOUNTER — Ambulatory Visit (INDEPENDENT_AMBULATORY_CARE_PROVIDER_SITE_OTHER): Payer: BC Managed Care – PPO | Admitting: Family Medicine

## 2013-02-27 ENCOUNTER — Encounter: Payer: Self-pay | Admitting: Family Medicine

## 2013-02-27 VITALS — BP 138/68 | HR 60 | Temp 98.1°F | Ht 73.0 in | Wt 209.0 lb

## 2013-02-27 DIAGNOSIS — E7800 Pure hypercholesterolemia, unspecified: Secondary | ICD-10-CM

## 2013-02-27 DIAGNOSIS — L408 Other psoriasis: Secondary | ICD-10-CM

## 2013-02-27 DIAGNOSIS — L409 Psoriasis, unspecified: Secondary | ICD-10-CM | POA: Insufficient documentation

## 2013-02-27 DIAGNOSIS — Z951 Presence of aortocoronary bypass graft: Secondary | ICD-10-CM

## 2013-02-27 DIAGNOSIS — M109 Gout, unspecified: Secondary | ICD-10-CM

## 2013-02-27 MED ORDER — COLCHICINE 0.6 MG PO TABS: 0.6000 mg | ORAL_TABLET | Freq: Two times a day (BID) | ORAL | Status: DC

## 2013-02-27 NOTE — Progress Notes (Signed)
  Subjective:    Patient ID: [name] [name], male    DOB: [birth year], 62 y.o.   MRN: [id number]  HPI Patient here to establish care Past no history reviewed. History of CAD with bypass 2005. He has hyperlipidemia. History of GERD controlled with over-the-counter medications. History of gout and takes colchicine only infrequently for flareups. He has psoriasis which has been followed by dermatology. Medications reviewed. He continues to see cardiologist regularly.  Exercises with walking with no difficulties. No recent chest pains. No dizziness. Patient is married. Nonsmoker. Only occasional alcohol use. Works in Airline pilot.  History of erectile dysfunction. Has used Viagra in the past. He knows he cannot use this concomitant with nitroglycerin.  Past Medical History  Diagnosis Date  . Coronary artery disease     Severe three-vessel coronary artery disease with ejection fraction of 65%  . History of psoriasis   . History of gout   . SOB (shortness of breath)     Mild  . Hypercholesterolemia   . Arthritis   . Chicken pox   . GERD (gastroesophageal reflux disease)    Past Surgical History  Procedure Laterality Date  . Tonsillectomy      Remote history of tonsillectomy.  . Cardiac catheterization  09/2003    Ejection fraction was estimated at 65%.  . Coronary artery bypass graft      Lima-lad,svg-diag,svg-om/distal LCX,svg-pda    reports that he has quit smoking. He does not have any smokeless tobacco history on file. He reports that  drinks alcohol. He reports that he does not use illicit drugs. family history includes Cancer in his mother; Heart disease in his mother; and Stroke in his mother. No Known Allergies    Review of Systems  Constitutional: Negative for appetite change, fatigue and unexpected weight change.  Eyes: Negative for visual disturbance.  Respiratory: Negative for cough, chest tightness and shortness of breath.   Cardiovascular: Negative for chest pain,  palpitations and leg swelling.  Gastrointestinal: Negative for abdominal pain.  Genitourinary: Negative for dysuria.  Neurological: Negative for dizziness, syncope, weakness, light-headedness and headaches.  Psychiatric/Behavioral: Negative for dysphoric mood.       Objective:   Physical Exam  Constitutional: He appears well-developed and well-nourished. No distress.  Neck: Neck supple. No thyromegaly present.  Cardiovascular: Normal rate and regular rhythm.  Exam reveals no gallop.   Pulmonary/Chest: Effort normal and breath sounds normal. No respiratory distress. He has no wheezes. He has no rales.  Musculoskeletal: He exhibits no edema.  Lymphadenopathy:    He has no cervical adenopathy.          Assessment & Plan:  #1 history of CAD. Patient brings in copy of labs from prior practice and these were reviewed. Lipids been well controlled. #2 gout. Refill colchicine for as needed use #3 history of psoriasis #4 erectile dysfunction. Patient cautioned to avoid concomitant use of nitroglycerin and Viagra

## 2013-02-27 NOTE — Patient Instructions (Addendum)
Schedule complete physical for next January.

## 2013-07-21 ENCOUNTER — Other Ambulatory Visit: Payer: BC Managed Care – PPO

## 2013-07-27 ENCOUNTER — Other Ambulatory Visit: Payer: BC Managed Care – PPO

## 2013-07-28 ENCOUNTER — Encounter: Payer: BC Managed Care – PPO | Admitting: Family Medicine

## 2013-07-30 ENCOUNTER — Encounter: Payer: BC Managed Care – PPO | Admitting: Family Medicine

## 2013-08-11 ENCOUNTER — Other Ambulatory Visit (INDEPENDENT_AMBULATORY_CARE_PROVIDER_SITE_OTHER): Payer: Medicare Other

## 2013-08-11 DIAGNOSIS — Z0000 Encounter for general adult medical examination without abnormal findings: Secondary | ICD-10-CM | POA: Diagnosis not present

## 2013-08-11 LAB — LIPID PANEL
Cholesterol: 124 mg/dL (ref 0–200)
HDL: 47.5 mg/dL (ref >39.00)
LDL Cholesterol: 69 mg/dL (ref 0–99)
TRIGLYCERIDES: 36 mg/dL (ref 0.0–149.0)
Total CHOL/HDL Ratio: 3
VLDL: 7.2 mg/dL (ref 0.0–40.0)

## 2013-08-11 LAB — HEPATIC FUNCTION PANEL
ALT: 19 U/L (ref 0–53)
AST: 18 U/L (ref 0–37)
Albumin: 3.7 g/dL (ref 3.5–5.2)
Alkaline Phosphatase: 60 U/L (ref 39–117)
BILIRUBIN DIRECT: 0.1 mg/dL (ref 0.0–0.3)
BILIRUBIN TOTAL: 0.8 mg/dL (ref 0.3–1.2)
Total Protein: 6.4 g/dL (ref 6.0–8.3)

## 2013-08-11 LAB — BASIC METABOLIC PANEL
BUN: 12 mg/dL (ref 6–23)
CO2: 27 mEq/L (ref 19–32)
Calcium: 9.5 mg/dL (ref 8.4–10.5)
Chloride: 105 mEq/L (ref 96–112)
Creatinine, Ser: 0.9 mg/dL (ref 0.4–1.5)
GFR: 94.86 mL/min (ref >60.00)
GLUCOSE: 91 mg/dL (ref 70–99)
POTASSIUM: 4.4 meq/L (ref 3.5–5.1)
Sodium: 139 mEq/L (ref 135–145)

## 2013-08-11 LAB — TSH: TSH: 0.89 u[IU]/mL (ref 0.35–5.50)

## 2013-08-11 LAB — CBC WITH DIFFERENTIAL/PLATELET
Basophils Absolute: 0 10*3/uL (ref 0.0–0.1)
Basophils Relative: 0.5 % (ref 0.0–3.0)
Eosinophils Absolute: 0.1 10*3/uL (ref 0.0–0.7)
Eosinophils Relative: 1.9 % (ref 0.0–5.0)
HEMATOCRIT: 40.6 % (ref 39.0–52.0)
Hemoglobin: 13.8 g/dL (ref 13.0–17.0)
Lymphocytes Relative: 25.7 % (ref 12.0–46.0)
Lymphs Abs: 1.4 10*3/uL (ref 0.7–4.0)
MCHC: 34 g/dL (ref 30.0–36.0)
MCV: 91.1 fl (ref 78.0–100.0)
MONO ABS: 0.5 10*3/uL (ref 0.1–1.0)
MONOS PCT: 10 % (ref 3.0–12.0)
NEUTROS PCT: 61.9 % (ref 43.0–77.0)
Neutro Abs: 3.3 10*3/uL (ref 1.4–7.7)
Platelets: 212 10*3/uL (ref 150.0–400.0)
RBC: 4.46 Mil/uL (ref 4.22–5.81)
RDW: 12.7 % (ref 11.5–14.6)
WBC: 5.4 10*3/uL (ref 4.5–10.5)

## 2013-08-11 LAB — PSA: PSA: 1.22 ng/mL (ref 0.10–4.00)

## 2013-08-15 ENCOUNTER — Other Ambulatory Visit: Payer: Self-pay | Admitting: Cardiology

## 2013-08-20 ENCOUNTER — Encounter: Payer: Self-pay | Admitting: Family Medicine

## 2013-08-20 ENCOUNTER — Ambulatory Visit (INDEPENDENT_AMBULATORY_CARE_PROVIDER_SITE_OTHER): Payer: Medicare Other | Admitting: Family Medicine

## 2013-08-20 VITALS — BP 134/74 | HR 60 | Temp 97.9°F | Wt 212.0 lb

## 2013-08-20 DIAGNOSIS — Z0000 Encounter for general adult medical examination without abnormal findings: Secondary | ICD-10-CM | POA: Diagnosis not present

## 2013-08-20 DIAGNOSIS — Z23 Encounter for immunization: Secondary | ICD-10-CM

## 2013-08-20 MED ORDER — COLCHICINE 0.6 MG PO TABS: 0.6000 mg | ORAL_TABLET | Freq: Two times a day (BID) | ORAL | Status: DC

## 2013-08-20 MED ORDER — SILDENAFIL CITRATE 100 MG PO TABS: 100.0000 mg | ORAL_TABLET | Freq: Every day | ORAL | Status: DC | PRN

## 2013-08-20 NOTE — Patient Instructions (Signed)

## 2013-08-20 NOTE — Progress Notes (Signed)
Pre visit review using our clinic review tool, if applicable. No additional management support is needed unless otherwise documented below in the visit note. 

## 2013-08-20 NOTE — Progress Notes (Signed)
Subjective:    Patient ID: [name] [name], male    DOB: 09/07/1950, 63 y.o.   MRN: [id number]  HPI Patient here for complete physical. Has chronic problems including history of CAD with bypass several years ago, psoriasis, hyperlipidemia, gout. He has infrequent gout flareups and takes colchicine as needed which seems to work well. Current medications reviewed. His chronic medications include altace, metoprolol, Lipitor, and aspirin. No recent chest pains. No consistent exercise. Colonoscopy 2013. Previous shingles vaccine given. No history of confirmed pneumonia vaccine. No flu vaccine yet.  Past Medical History  Diagnosis Date  . Coronary artery disease     Severe three-vessel coronary artery disease with ejection fraction of 65%  . History of psoriasis   . History of gout   . SOB (shortness of breath)     Mild  . Hypercholesterolemia   . Arthritis   . Chicken pox   . GERD (gastroesophageal reflux disease)    Past Surgical History  Procedure Laterality Date  . Tonsillectomy      Remote history of tonsillectomy.  . Cardiac catheterization  09/2003    Ejection fraction was estimated at 65%.  . Coronary artery bypass graft      Lima-lad,svg-diag,svg-om/distal LCX,svg-pda    reports that he has quit smoking. He does not have any smokeless tobacco history on file. He reports that he drinks alcohol. He reports that he does not use illicit drugs. family history includes Cancer in his mother; Heart disease in his mother; Stroke in his mother. No Known Allergies    Review of Systems  Constitutional: Negative for fever, chills, activity change, appetite change, fatigue and unexpected weight change.  HENT: Negative for congestion, ear pain and trouble swallowing.   Eyes: Negative for pain and visual disturbance.  Respiratory: Negative for cough, shortness of breath and wheezing.   Cardiovascular: Negative for chest pain and palpitations.  Gastrointestinal: Negative for nausea,  vomiting, abdominal pain, diarrhea, constipation, blood in stool, abdominal distention and rectal pain.  Endocrine: Negative for polydipsia and polyuria.  Genitourinary: Negative for dysuria, hematuria and testicular pain.  Musculoskeletal: Negative for arthralgias and joint swelling.  Skin: Negative for rash.  Neurological: Negative for dizziness, syncope and headaches.  Hematological: Negative for adenopathy.  Psychiatric/Behavioral: Negative for confusion and dysphoric mood.       Objective:   Physical Exam  Constitutional: He is oriented to person, place, and time. He appears well-developed and well-nourished. No distress.  HENT:  Head: Normocephalic and atraumatic.  Right Ear: External ear normal.  Left Ear: External ear normal.  Mouth/Throat: Oropharynx is clear and moist.  Eyes: Conjunctivae and EOM are normal. Pupils are equal, round, and reactive to light.  Neck: Normal range of motion. Neck supple. No thyromegaly present.  Cardiovascular: Normal rate, regular rhythm and normal heart sounds.   No murmur heard. Pulmonary/Chest: No respiratory distress. He has no wheezes. He has no rales.  Abdominal: Soft. Bowel sounds are normal. He exhibits no distension and no mass. There is no tenderness. There is no rebound and no guarding.  Musculoskeletal: He exhibits no edema.  Lymphadenopathy:    He has no cervical adenopathy.  Neurological: He is alert and oriented to person, place, and time. He displays normal reflexes. No cranial nerve deficit.  Skin: No rash noted.  Psychiatric: He has a normal mood and affect.          Assessment & Plan:  Complete physical. Labs reviewed. No major concerns. Flu vaccine given. Prevnar 13  given. Refill colchicine. Refill Viagra for as needed use and he knows not to mix this with nitroglycerin.

## 2013-09-29 ENCOUNTER — Other Ambulatory Visit: Payer: Self-pay

## 2013-09-29 MED ORDER — ATORVASTATIN CALCIUM 40 MG PO TABS: 40.0000 mg | ORAL_TABLET | Freq: Every day | ORAL | Status: DC

## 2013-09-29 MED ORDER — RAMIPRIL 2.5 MG PO CAPS: ORAL_CAPSULE | ORAL | Status: DC

## 2013-09-29 MED ORDER — NITROGLYCERIN 0.4 MG SL SUBL: 0.4000 mg | SUBLINGUAL_TABLET | SUBLINGUAL | Status: DC | PRN

## 2013-10-06 DIAGNOSIS — H5210 Myopia, unspecified eye: Secondary | ICD-10-CM | POA: Diagnosis not present

## 2013-10-06 DIAGNOSIS — H524 Presbyopia: Secondary | ICD-10-CM | POA: Diagnosis not present

## 2013-12-11 ENCOUNTER — Ambulatory Visit: Payer: Medicare Other | Admitting: Cardiology

## 2014-01-04 ENCOUNTER — Encounter: Payer: Self-pay | Admitting: Cardiology

## 2014-01-04 ENCOUNTER — Ambulatory Visit (INDEPENDENT_AMBULATORY_CARE_PROVIDER_SITE_OTHER): Payer: Medicare Other | Admitting: Cardiology

## 2014-01-04 VITALS — BP 150/84 | HR 63 | Ht 73.0 in | Wt 210.8 lb

## 2014-01-04 DIAGNOSIS — E7800 Pure hypercholesterolemia, unspecified: Secondary | ICD-10-CM

## 2014-01-04 DIAGNOSIS — I2510 Atherosclerotic heart disease of native coronary artery without angina pectoris: Secondary | ICD-10-CM | POA: Diagnosis not present

## 2014-01-04 DIAGNOSIS — Z951 Presence of aortocoronary bypass graft: Secondary | ICD-10-CM

## 2014-01-04 NOTE — Patient Instructions (Signed)
Continue your current therapy  I will see you in one year   

## 2014-01-04 NOTE — Progress Notes (Signed)
[name] [name] Date of Birth: [birth year] Medical Record #[id number]  History of Present Illness: [name] [name] is seen today for yearly followup. He is status post CABG in 2005. He had a normal stress echo in May of 2013. He continues to do very well and remains very active. He exercises regularly. He denies any chest pain, dyspnea, or palpitations. His father just passed away this week at the age of 42.   Current Outpatient Prescriptions on File Prior to Visit  Medication Sig Dispense Refill  . aspirin 81 MG tablet Take 81 mg by mouth daily.      Marland Kitchen atorvastatin (LIPITOR) 40 MG tablet Take 1 tablet (40 mg total) by mouth daily.  90 tablet  0  . Borage, Borago officinalis, (BORAGE OIL) 1000 MG CAPS Take 1,000 mg by mouth daily.      . cholecalciferol (VITAMIN D) 1000 UNITS tablet Take 1,000 Units by mouth daily.        . Coenzyme Q10 (CO Q 10 PO) Take 100 mg by mouth daily.       . colchicine 0.6 MG tablet Take 1 tablet (0.6 mg total) by mouth 2 (two) times daily.  60 tablet  3  . metoprolol succinate (TOPROL-XL) 25 MG 24 hr tablet Take 1 tablet (25 mg total) by mouth daily.  90 tablet  3  . multivitamin (THERAGRAN) per tablet Take 1 tablet by mouth daily.        . nitroGLYCERIN (NITROSTAT) 0.4 MG SL tablet Place 1 tablet (0.4 mg total) under the tongue every 5 (five) minutes as needed for chest pain.  25 tablet  3  . Omega-3 Fatty Acids (FISH OIL CONCENTRATE PO) Take 1,700 mg by mouth daily.        . ramipril (ALTACE) 2.5 MG capsule TAKE ONE CAPSULE EVERY DAY  90 capsule  0  . sildenafil (VIAGRA) 100 MG tablet Take 1 tablet (100 mg total) by mouth daily as needed for erectile dysfunction.  5 tablet  11   No current facility-administered medications on file prior to visit.    No Known Allergies  Past Medical History  Diagnosis Date  . Coronary artery disease     Severe three-vessel coronary artery disease with ejection fraction of 65%  . History of psoriasis   . History of gout   .  SOB (shortness of breath)     Mild  . Hypercholesterolemia   . Arthritis   . Chicken pox   . GERD (gastroesophageal reflux disease)     Past Surgical History  Procedure Laterality Date  . Tonsillectomy      Remote history of tonsillectomy.  . Cardiac catheterization  09/2003    Ejection fraction was estimated at 65%.  . Coronary artery bypass graft      Lima-lad,svg-diag,svg-om/distal LCX,svg-pda    History  Smoking status  . Former Smoker  Smokeless tobacco  . Not on file    History  Alcohol Use  . Yes    Family History  Problem Relation Age of Onset  . Heart disease Mother     cabg  . Cancer Mother     breast  . Stroke Mother     Review of Systems: As noted in history of present illness.  All other systems were reviewed and are negative.  Physical Exam: BP 150/84  Pulse 63  Ht 6\' 1"  (1.854 m)  Wt 210 lb 12.8 oz (95.618 kg)  BMI 27.82 kg/m2 He is a well-developed white male  in no acute distress. HEENT exam is unremarkable.  The carotids are 2+ without bruits.  There is no thyromegaly.  There is no JVD.  The lungs are clear.    The heart exam reveals a regular rate with a normal S1 and S2.  There are no murmurs, gallops, or rubs.  The PMI is not displaced.   Abdominal exam reveals good bowel sounds.   There are no masses.  Exam of the legs reveal no clubbing, cyanosis, or edema.  The legs are without rashes.  The distal pulses are intact.  Cranial nerves II - XII are intact.  Motor and sensory functions are intact.  The gait is normal.   LABORATORY DATA: ECG today demonstrates normal sinus rhythm with a rate of 63 beats per minute. It is otherwise normal.  Lab Results  Component Value Date   WBC 5.4 08/11/2013   HGB 13.8 08/11/2013   HCT 40.6 08/11/2013   PLT 212.0 08/11/2013   GLUCOSE 91 08/11/2013   CHOL 124 08/11/2013   TRIG 36.0 08/11/2013   HDL 47.50 08/11/2013   LDLCALC 69 08/11/2013   ALT 19 08/11/2013   AST 18 08/11/2013   NA 139 08/11/2013   K 4.4  08/11/2013   CL 105 08/11/2013   CREATININE 0.9 08/11/2013   BUN 12 08/11/2013   CO2 27 08/11/2013   TSH 0.89 08/11/2013   PSA 1.22 08/11/2013   INR 1.08 06/12/2009    Assessment / Plan: 1. Coronary disease status post CABG in 2005. He is asymptomatic. He had a normal stress echo March 2013. We will continue risk factor modification. Continue metoprolol and aspirin therapy.  2. Hyperlipidemia. Excellent control on current therapy.  3. Hypertension-elevated today but he reports normal readings before. Recent stressors with father passing away.

## 2014-02-19 ENCOUNTER — Other Ambulatory Visit: Payer: Self-pay | Admitting: Cardiology

## 2014-03-14 ENCOUNTER — Inpatient Hospital Stay (HOSPITAL_COMMUNITY)
Admission: EM | Admit: 2014-03-14 | Discharge: 2014-03-16 | DRG: 309 | Disposition: A | Discharge status: Home / Self Care | Payer: Medicare Other | Attending: Cardiology | Admitting: Cardiology

## 2014-03-14 ENCOUNTER — Encounter (HOSPITAL_COMMUNITY): Payer: Self-pay | Admitting: Emergency Medicine

## 2014-03-14 ENCOUNTER — Emergency Department (HOSPITAL_COMMUNITY): Payer: Medicare Other

## 2014-03-14 DIAGNOSIS — Z7982 Long term (current) use of aspirin: Secondary | ICD-10-CM

## 2014-03-14 DIAGNOSIS — Q2111 Secundum atrial septal defect: Secondary | ICD-10-CM | POA: Diagnosis not present

## 2014-03-14 DIAGNOSIS — Q211 Atrial septal defect: Secondary | ICD-10-CM

## 2014-03-14 DIAGNOSIS — Z951 Presence of aortocoronary bypass graft: Secondary | ICD-10-CM

## 2014-03-14 DIAGNOSIS — I10 Essential (primary) hypertension: Secondary | ICD-10-CM | POA: Diagnosis present

## 2014-03-14 DIAGNOSIS — I248 Other forms of acute ischemic heart disease: Secondary | ICD-10-CM | POA: Diagnosis not present

## 2014-03-14 DIAGNOSIS — Z79899 Other long term (current) drug therapy: Secondary | ICD-10-CM

## 2014-03-14 DIAGNOSIS — I4892 Unspecified atrial flutter: Principal | ICD-10-CM | POA: Diagnosis present

## 2014-03-14 DIAGNOSIS — M109 Gout, unspecified: Secondary | ICD-10-CM | POA: Diagnosis present

## 2014-03-14 DIAGNOSIS — R0602 Shortness of breath: Secondary | ICD-10-CM | POA: Diagnosis not present

## 2014-03-14 DIAGNOSIS — I200 Unstable angina: Secondary | ICD-10-CM

## 2014-03-14 DIAGNOSIS — E7800 Pure hypercholesterolemia, unspecified: Secondary | ICD-10-CM | POA: Diagnosis present

## 2014-03-14 DIAGNOSIS — R7989 Other specified abnormal findings of blood chemistry: Secondary | ICD-10-CM

## 2014-03-14 DIAGNOSIS — E785 Hyperlipidemia, unspecified: Secondary | ICD-10-CM | POA: Diagnosis present

## 2014-03-14 DIAGNOSIS — R0989 Other specified symptoms and signs involving the circulatory and respiratory systems: Secondary | ICD-10-CM | POA: Diagnosis not present

## 2014-03-14 DIAGNOSIS — I2489 Other forms of acute ischemic heart disease: Secondary | ICD-10-CM | POA: Diagnosis present

## 2014-03-14 DIAGNOSIS — R0609 Other forms of dyspnea: Secondary | ICD-10-CM | POA: Diagnosis not present

## 2014-03-14 DIAGNOSIS — L408 Other psoriasis: Secondary | ICD-10-CM | POA: Diagnosis present

## 2014-03-14 DIAGNOSIS — I214 Non-ST elevation (NSTEMI) myocardial infarction: Secondary | ICD-10-CM | POA: Diagnosis not present

## 2014-03-14 DIAGNOSIS — Z87891 Personal history of nicotine dependence: Secondary | ICD-10-CM | POA: Diagnosis not present

## 2014-03-14 DIAGNOSIS — I2510 Atherosclerotic heart disease of native coronary artery without angina pectoris: Secondary | ICD-10-CM | POA: Diagnosis present

## 2014-03-14 DIAGNOSIS — I484 Atypical atrial flutter: Secondary | ICD-10-CM

## 2014-03-14 DIAGNOSIS — R002 Palpitations: Secondary | ICD-10-CM | POA: Diagnosis not present

## 2014-03-14 HISTORY — DX: Essential (primary) hypertension: I10

## 2014-03-14 LAB — CBC
HCT: 46.8 % (ref 39.0–52.0)
Hemoglobin: 16.7 g/dL (ref 13.0–17.0)
MCH: 32.4 pg (ref 26.0–34.0)
MCHC: 35.7 g/dL (ref 30.0–36.0)
MCV: 90.7 fL (ref 78.0–100.0)
PLATELETS: 192 10*3/uL (ref 150–400)
RBC: 5.16 MIL/uL (ref 4.22–5.81)
RDW: 12.8 % (ref 11.5–15.5)
WBC: 9 10*3/uL (ref 4.0–10.5)

## 2014-03-14 LAB — CBC WITH DIFFERENTIAL/PLATELET
BASOS PCT: 1 % (ref 0–1)
Basophils Absolute: 0 10*3/uL (ref 0.0–0.1)
EOS ABS: 0.4 10*3/uL (ref 0.0–0.7)
EOS PCT: 7 % — AB (ref 0–5)
HCT: 41.9 % (ref 39.0–52.0)
HEMOGLOBIN: 14.7 g/dL (ref 13.0–17.0)
LYMPHS ABS: 1.9 10*3/uL (ref 0.7–4.0)
Lymphocytes Relative: 30 % (ref 12–46)
MCH: 31.4 pg (ref 26.0–34.0)
MCHC: 35.1 g/dL (ref 30.0–36.0)
MCV: 89.5 fL (ref 78.0–100.0)
Monocytes Absolute: 0.6 10*3/uL (ref 0.1–1.0)
Monocytes Relative: 10 % (ref 3–12)
NEUTROS PCT: 52 % (ref 43–77)
Neutro Abs: 3.3 10*3/uL (ref 1.7–7.7)
Platelets: 154 10*3/uL (ref 150–400)
RBC: 4.68 MIL/uL (ref 4.22–5.81)
RDW: 12.9 % (ref 11.5–15.5)
WBC: 6.4 10*3/uL (ref 4.0–10.5)

## 2014-03-14 LAB — COMPREHENSIVE METABOLIC PANEL
ALT: 40 U/L (ref 0–53)
AST: 38 U/L — ABNORMAL HIGH (ref 0–37)
Albumin: 3.4 g/dL — ABNORMAL LOW (ref 3.5–5.2)
Alkaline Phosphatase: 67 U/L (ref 39–117)
Anion gap: 14 (ref 5–15)
BUN: 15 mg/dL (ref 6–23)
CALCIUM: 8.7 mg/dL (ref 8.4–10.5)
CO2: 19 meq/L (ref 19–32)
CREATININE: 0.82 mg/dL (ref 0.50–1.35)
Chloride: 108 mEq/L (ref 96–112)
GFR calc non Af Amer: >90 mL/min (ref >90)
GLUCOSE: 111 mg/dL — AB (ref 70–99)
Potassium: 4 mEq/L (ref 3.7–5.3)
SODIUM: 141 meq/L (ref 137–147)
TOTAL PROTEIN: 6.1 g/dL (ref 6.0–8.3)
Total Bilirubin: 0.4 mg/dL (ref 0.3–1.2)

## 2014-03-14 LAB — I-STAT TROPONIN, ED: Troponin i, poc: 0.35 ng/mL (ref 0.00–0.08)

## 2014-03-14 LAB — BASIC METABOLIC PANEL
ANION GAP: 19 — AB (ref 5–15)
BUN: 18 mg/dL (ref 6–23)
CALCIUM: 9.8 mg/dL (ref 8.4–10.5)
CO2: 20 mEq/L (ref 19–32)
Chloride: 100 mEq/L (ref 96–112)
Creatinine, Ser: 1.02 mg/dL (ref 0.50–1.35)
GFR calc Af Amer: 87 mL/min — ABNORMAL LOW (ref >90)
GFR, EST NON AFRICAN AMERICAN: 75 mL/min — AB (ref >90)
Glucose, Bld: 103 mg/dL — ABNORMAL HIGH (ref 70–99)
Potassium: 4 mEq/L (ref 3.7–5.3)
Sodium: 139 mEq/L (ref 137–147)

## 2014-03-14 LAB — TROPONIN I: Troponin I: 0.74 ng/mL (ref <0.30)

## 2014-03-14 LAB — TSH: TSH: 1.4 u[IU]/mL (ref 0.350–4.500)

## 2014-03-14 LAB — MRSA PCR SCREENING: MRSA by PCR: NEGATIVE

## 2014-03-14 LAB — PRO B NATRIURETIC PEPTIDE: Pro B Natriuretic peptide (BNP): 3523 pg/mL — ABNORMAL HIGH (ref 0–125)

## 2014-03-14 MED ORDER — NITROGLYCERIN 0.4 MG SL SUBL: 0.4000 mg | SUBLINGUAL_TABLET | SUBLINGUAL | Status: DC | PRN

## 2014-03-14 MED ORDER — DILTIAZEM HCL 100 MG IV SOLR
5.0000 mg/h | INTRAVENOUS | Status: DC
  Administered 2014-03-14: 10 mg/h via INTRAVENOUS
  Filled 2014-03-14: qty 100

## 2014-03-14 MED ORDER — MORPHINE SULFATE 2 MG/ML IJ SOLN
INTRAMUSCULAR | Status: AC
  Filled 2014-03-14: qty 1

## 2014-03-14 MED ORDER — ACETAMINOPHEN 325 MG PO TABS: 650.0000 mg | ORAL_TABLET | ORAL | Status: DC | PRN

## 2014-03-14 MED ORDER — METOPROLOL TARTRATE 1 MG/ML IV SOLN
5.0000 mg | Freq: Once | INTRAVENOUS | Status: AC
  Administered 2014-03-14: 5 mg via INTRAVENOUS
  Filled 2014-03-14: qty 5

## 2014-03-14 MED ORDER — DILTIAZEM HCL 100 MG IV SOLR
5.0000 mg/h | INTRAVENOUS | Status: DC
  Administered 2014-03-15: 5 mg/h via INTRAVENOUS
  Filled 2014-03-14 (×2): qty 100

## 2014-03-14 MED ORDER — ASPIRIN EC 81 MG PO TBEC
81.0000 mg | DELAYED_RELEASE_TABLET | Freq: Every day | ORAL | Status: DC
  Administered 2014-03-15 – 2014-03-16 (×2): 81 mg via ORAL
  Filled 2014-03-14 (×2): qty 1

## 2014-03-14 MED ORDER — METOPROLOL TARTRATE 1 MG/ML IV SOLN
INTRAVENOUS | Status: AC
  Filled 2014-03-14: qty 5

## 2014-03-14 MED ORDER — ADENOSINE 6 MG/2ML IV SOLN
6.0000 mg | Freq: Once | INTRAVENOUS | Status: AC
  Administered 2014-03-14: 6 mg via INTRAVENOUS
  Filled 2014-03-14: qty 2

## 2014-03-14 MED ORDER — SODIUM CHLORIDE 0.9 % IV BOLUS (SEPSIS)
1000.0000 mL | Freq: Once | INTRAVENOUS | Status: AC
  Administered 2014-03-14: 1000 mL via INTRAVENOUS

## 2014-03-14 MED ORDER — DILTIAZEM LOAD VIA INFUSION
15.0000 mg | Freq: Once | INTRAVENOUS | Status: AC
  Administered 2014-03-14: 15 mg via INTRAVENOUS
  Filled 2014-03-14: qty 15

## 2014-03-14 MED ORDER — RAMIPRIL 2.5 MG PO CAPS
2.5000 mg | ORAL_CAPSULE | Freq: Every day | ORAL | Status: DC
  Administered 2014-03-15 – 2014-03-16 (×2): 2.5 mg via ORAL
  Filled 2014-03-14 (×3): qty 1

## 2014-03-14 MED ORDER — ASPIRIN 300 MG RE SUPP
300.0000 mg | RECTAL | Status: AC
  Filled 2014-03-14: qty 1

## 2014-03-14 MED ORDER — ASPIRIN 81 MG PO CHEW
324.0000 mg | CHEWABLE_TABLET | Freq: Once | ORAL | Status: AC
  Administered 2014-03-14: 324 mg via ORAL

## 2014-03-14 MED ORDER — HEPARIN BOLUS VIA INFUSION
4000.0000 [IU] | Freq: Once | INTRAVENOUS | Status: AC
  Administered 2014-03-14: 4000 [IU] via INTRAVENOUS
  Filled 2014-03-14: qty 4000

## 2014-03-14 MED ORDER — CETYLPYRIDINIUM CHLORIDE 0.05 % MT LIQD
7.0000 mL | Freq: Two times a day (BID) | OROMUCOSAL | Status: DC
  Administered 2014-03-14 – 2014-03-16 (×2): 7 mL via OROMUCOSAL

## 2014-03-14 MED ORDER — ASPIRIN 81 MG PO CHEW
CHEWABLE_TABLET | ORAL | Status: AC
  Filled 2014-03-14: qty 4

## 2014-03-14 MED ORDER — ONDANSETRON HCL 4 MG/2ML IJ SOLN: 4.0000 mg | Freq: Four times a day (QID) | INTRAMUSCULAR | Status: DC | PRN

## 2014-03-14 MED ORDER — ATORVASTATIN CALCIUM 40 MG PO TABS
40.0000 mg | ORAL_TABLET | Freq: Every day | ORAL | Status: DC
  Administered 2014-03-15: 40 mg via ORAL
  Filled 2014-03-14 (×3): qty 1

## 2014-03-14 MED ORDER — ATORVASTATIN CALCIUM 40 MG PO TABS: 40.0000 mg | ORAL_TABLET | Freq: Every day | ORAL | Status: DC

## 2014-03-14 MED ORDER — ASPIRIN 81 MG PO CHEW: 324.0000 mg | CHEWABLE_TABLET | ORAL | Status: AC

## 2014-03-14 MED ORDER — METOPROLOL SUCCINATE ER 25 MG PO TB24
25.0000 mg | ORAL_TABLET | Freq: Every day | ORAL | Status: DC
  Administered 2014-03-15: 25 mg via ORAL
  Filled 2014-03-14 (×3): qty 1

## 2014-03-14 MED ORDER — HEPARIN (PORCINE) IN NACL 100-0.45 UNIT/ML-% IJ SOLN
1200.0000 [IU]/h | INTRAMUSCULAR | Status: AC
  Administered 2014-03-14: 1200 [IU]/h via INTRAVENOUS
  Filled 2014-03-14 (×4): qty 250

## 2014-03-14 MED ORDER — COLCHICINE 0.6 MG PO TABS
0.6000 mg | ORAL_TABLET | Freq: Two times a day (BID) | ORAL | Status: DC
  Administered 2014-03-16: 0.6 mg via ORAL
  Filled 2014-03-14 (×5): qty 1

## 2014-03-14 NOTE — ED Notes (Signed)
Pt reports intermittent SOB, diaphoresis, weakness, and rapid heart rate/ palpatations onset with activity. Denies pain, N/V, lightheadedness or any other sx at this time. A&O x 4. Talking in complete sentences.

## 2014-03-14 NOTE — ED Notes (Signed)
Report called to [facility]

## 2014-03-14 NOTE — H&P (Signed)
History and Physical   Admit date: 03/14/2014 Name:  [name] [name] Medical record number: [id number] DOB/Age:  [birth year]  63 y.o. male  Referring Physician:   [facility]  Primary Cardiologist:  [name]  Chief complaint/reason for admission: Palpitations and rapid heartbeat  HPI:  This 63 year old male has a history of coronary artery disease with previous bypass grafting in 2005. A stress echo was normal 2 years ago. He has had no recurrence of angina like he did prior to his bypass. He also has significant psoriasis and was recently taken off of methotrexate possibly being considered for treatment with Enbrel.  He was in his usual state of health but developed palpitations earlier today associated with some diaphoresis. He was brought to [facility] emergency room where he was found to have a rapid heart rate. In the emergency room he was given intravenous Lopressor without effect but after I saw the patient I gave him some intravenous adenosine that resulted in bringing out what was considered flutter waves. There was a consideration about whether he was having acute an acute inferior infarction however [name] and myself reviewed the EKG and felt it was more compatible with arrhythmia. He has not had any of the mid sternal symptoms that he had when he had his previous infarction and is currently feeling better. He denies angina, PND, orthopnea or edema. He has felt somewhat depressed since the death of his father recently due to old age.    Past Medical History  Diagnosis Date  . Coronary artery disease     Severe three-vessel coronary artery disease with ejection fraction of 65%  . History of psoriasis   . History of gout   . SOB (shortness of breath)     Mild  . Hypercholesterolemia   . Arthritis   . Chicken pox   . GERD (gastroesophageal reflux disease)   . Hypertension        Past Surgical History  Procedure Laterality Date  . Tonsillectomy     Remote history of tonsillectomy.  . Cardiac catheterization  09/2003    Ejection fraction was estimated at 65%.  . Coronary artery bypass graft      Lima-lad,svg-diag,svg-om/distal LCX,svg-pda  .  Allergies: has No Known Allergies.   Medications: Prior to Admission medications   Medication Sig Start Date End Date Taking? Authorizing Provider  aspirin 81 MG tablet Take 81 mg by mouth daily.    Historical Provider, MD  atorvastatin (LIPITOR) 40 MG tablet TAKE 1 TABLET DAILY. 02/19/14   Peter M Martinique, MD  Borage, Borago officinalis, (BORAGE OIL) 1000 MG CAPS Take 1,000 mg by mouth daily.    Historical Provider, MD  cholecalciferol (VITAMIN D) 1000 UNITS tablet Take 1,000 Units by mouth daily.      Historical Provider, MD  Coenzyme Q10 (CO Q 10 PO) Take 100 mg by mouth daily.     Historical Provider, MD  colchicine 0.6 MG tablet Take 1 tablet (0.6 mg total) by mouth 2 (two) times daily. 08/20/13   [name], MD  metoprolol succinate (TOPROL-XL) 25 MG 24 hr tablet Take 1 tablet (25 mg total) by mouth daily. 12/16/12   Peter M Martinique, MD  multivitamin [facility]) per tablet Take 1 tablet by mouth daily.      Historical Provider, MD  nitroGLYCERIN (NITROSTAT) 0.4 MG SL tablet Place 1 tablet (0.4 mg total) under the tongue every 5 (five) minutes as needed for chest pain. 09/29/13  Thompson Grayer, MD  Omega-3 Fatty Acids (FISH OIL CONCENTRATE PO) Take 1,700 mg by mouth daily.      Historical Provider, MD  ramipril (ALTACE) 2.5 MG capsule TAKE ONE CAPSULE EVERY DAY 09/29/13   Thompson Grayer, MD  sildenafil (VIAGRA) 100 MG tablet Take 1 tablet (100 mg total) by mouth daily as needed for erectile dysfunction. 08/20/13   [name], MD    Family History:  Family Status  Relation Status Death Age  . Mother Deceased 66    cva  . Father  deceased   33  . Brother Alive     Social History:   reports that he has quit smoking. He does not have any smokeless tobacco history on file. He reports  that he drinks alcohol. He reports that he does not use illicit drugs.   History   Social History Narrative  .  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 Mildly overweight. Does have a history of some reflux in the past.  Other than as noted above, the remainder of the review of systems is normal  Physical Exam: BP 124/80  Pulse 110  Temp(Src) 97.7 F (36.5 C) (Oral)  Resp 21  Ht 6\' 1"  (1.854 m)  Wt 98.4 kg (216 lb 14.9 oz)  BMI 28.63 kg/m2  SpO2 99% General appearance: Pleasant white male mildly overweight currently in no acute distress Head: Normocephalic, without obvious abnormality, atraumatic Eyes: conjunctivae/corneas clear. PERRL, EOM's intact. Fundi not examined  Neck: no adenopathy, no carotid bruit, no JVD and supple, symmetrical, trachea midline Lungs: clear to auscultation bilaterally Heart: Rapid heart rate, normal S1-S2, no S3 or murmur Abdomen: soft, non-tender; bowel sounds normal; no masses,  no organomegaly Rectal: deferred Extremities: extremities normal, atraumatic, no cyanosis or edema Pulses: 2+ and symmetric Skin: Diffuse psoriatic rash over trunk arms and legs Neurologic: Grossly normal  Labs: CBC  Recent Labs  03/14/14 1902  WBC 9.0  RBC 5.16  HGB 16.7  HCT 46.8  PLT 192  MCV 90.7  MCH 32.4  MCHC 35.7  RDW 12.8   CMP   Recent Labs  03/14/14 1902  NA 139  K 4.0  CL 100  CO2 20  GLUCOSE 103*  BUN 18  CREATININE 1.02  CALCIUM 9.8  GFRNONAA 75*  GFRAA 87*   BNP (last 3 results)  Recent Labs  03/14/14 1902  PROBNP 3523.0*   Cardiac Panel (last 3 results) Troponin (Point of Care Test)  Recent Labs  03/14/14 1911  TROPIPOC 0.35*   Thyroid  Lab Results  Component Value Date   TSH 0.89 08/11/2013    EKG: Atrial flutter with 2-1 block versus atrial tachycardia  with 21 block. Nonspecific ST-T wave changes  Radiology: Mild cardiomegaly, no edema   IMPRESSIONS: 1. Atrial flutter with rapid response 2. Elevated troponin likely due to demand ischemia but will need to rule out a non-STEMI 3. Coronary artery disease with previous bypass grafting 4. Psoriasis 5. Hyperlipidemia  PLAN: Troponin is mildly elevated. Continue to cycle troponins although he does not clinically have an infarct. Intravenous diltiazem to control rate. N.p.o. after midnight for possible TEE cardioversion.  Signed: Kerry Hough MD [facility]  03/14/2014, 10:07 PM

## 2014-03-14 NOTE — ED Notes (Signed)
Report given to Floor RN 

## 2014-03-14 NOTE — Progress Notes (Signed)
ANTICOAGULATION CONSULT NOTE - Initial Consult  Pharmacy Consult for IV heparin Indication: chest pain/ACS  No Known Allergies  Patient Measurements: Height: 6' 0.83" (185 cm) Weight: 210 lb 12.2 oz (95.6 kg) IBW/kg (Calculated) : 79.52 Heparin Dosing Weight: 95.6 kg  Vital Signs: Temp: 97.7 F (36.5 C) (08/23 1906) Temp src: Oral (08/23 1906) BP: 149/87 mmHg (08/23 1906)  Labs:  Recent Labs  03/14/14 1902  HGB 16.7  HCT 46.8  PLT 192    Estimated Creatinine Clearance: 99.4 ml/min (by C-G formula based on Cr of 0.9).   Medical History: Past Medical History  Diagnosis Date  . Coronary artery disease     Severe three-vessel coronary artery disease with ejection fraction of 65%  . History of psoriasis   . History of gout   . SOB (shortness of breath)     Mild  . Hypercholesterolemia   . Arthritis   . Chicken pox   . GERD (gastroesophageal reflux disease)     Medications:  Scheduled:  . metoprolol  5 mg Intravenous Once   Infusions:    Assessment: 63 yo male presented to ER with SOB with hx HTN, HLP, CAD s/p triple bypass found to have possible STEMI/ACS to start IV heparin per pharmacy dosing. Baseline CBC stable.  Goal of Therapy:  Heparin level 0.3-0.7 units/ml Monitor platelets by anticoagulation protocol: Yes   Plan:  1) 4000 unit IV heparin loading dose then 2) 1200 units/hr IV heparin infusion rate 3) Check a heparin level 6 hours after start of heparin infusion 4) Daily CBC and heparin level while on IV heparin  Adrian Saran, PharmD, BCPS Pager [phone] 03/14/2014 7:34 PM

## 2014-03-14 NOTE — ED Notes (Signed)
We met him at triage and took him directly to ED resus B, where he was met by two of our nurses and EKG performed.  He is awake and alert and oriented x 4 and tells [place] he has had a few episodes of palpitations today and yesterday.  He is mildly diaphoretic and is in no distress.  He tells [place] Dr. [name] is his cardiologist.

## 2014-03-14 NOTE — ED Provider Notes (Signed)
CSN: [id number]     Arrival date & time 03/14/14  1853 History   First MD Initiated Contact with Patient 03/14/14 1857     Chief Complaint  Patient presents with  . Shortness of Breath     (Consider location/radiation/quality/duration/timing/severity/associated sxs/prior Treatment) Patient is a 63 y.o. male presenting with shortness of breath. The history is provided by the patient and medical records.  Shortness of Breath  This is a 63 year old male with past medical history significant for hypertension, hyperlipidemia, coronary artery disease status post triple bypass, presenting to the ED for intermittent shortness of breath, diaphoresis, and palpitations beginning earlier today. Patient states symptoms occuring with exertional activity, none when lying still.  Wife notes some mild swelling of his bilateral ankles.  No recent travel, calf swelling, or leg pain.  No prior hx of DVT or PE.  Patient is followed regularly by [name]-- recent FU visit in June, has been doing well since this time.  Denies any chest pain.  Past Medical History  Diagnosis Date  . Coronary artery disease     Severe three-vessel coronary artery disease with ejection fraction of 65%  . History of psoriasis   . History of gout   . SOB (shortness of breath)     Mild  . Hypercholesterolemia   . Arthritis   . Chicken pox   . GERD (gastroesophageal reflux disease)    Past Surgical History  Procedure Laterality Date  . Tonsillectomy      Remote history of tonsillectomy.  . Cardiac catheterization  09/2003    Ejection fraction was estimated at 65%.  . Coronary artery bypass graft      Lima-lad,svg-diag,svg-om/distal LCX,svg-pda   Family History  Problem Relation Age of Onset  . Heart disease Mother     cabg  . Cancer Mother     breast  . Stroke Mother    History  Substance Use Topics  . Smoking status: Former Research scientist (life sciences)  . Smokeless tobacco: Not on file  . Alcohol Use: Yes    Review of Systems   Respiratory: Positive for shortness of breath.   Neurological: Positive for dizziness.  All other systems reviewed and are negative.     Allergies  Review of patient's allergies indicates no known allergies.  Home Medications   Prior to Admission medications   Medication Sig Start Date End Date Taking? Authorizing Provider  aspirin 81 MG tablet Take 81 mg by mouth daily.    Historical Provider, MD  atorvastatin (LIPITOR) 40 MG tablet TAKE 1 TABLET DAILY. 02/19/14   Peter M Martinique, MD  Borage, Borago officinalis, (BORAGE OIL) 1000 MG CAPS Take 1,000 mg by mouth daily.    Historical Provider, MD  cholecalciferol (VITAMIN D) 1000 UNITS tablet Take 1,000 Units by mouth daily.      Historical Provider, MD  Coenzyme Q10 (CO Q 10 PO) Take 100 mg by mouth daily.     Historical Provider, MD  colchicine 0.6 MG tablet Take 1 tablet (0.6 mg total) by mouth 2 (two) times daily. 08/20/13   [name], MD  metoprolol succinate (TOPROL-XL) 25 MG 24 hr tablet Take 1 tablet (25 mg total) by mouth daily. 12/16/12   Peter M Martinique, MD  multivitamin [facility]) per tablet Take 1 tablet by mouth daily.      Historical Provider, MD  nitroGLYCERIN (NITROSTAT) 0.4 MG SL tablet Place 1 tablet (0.4 mg total) under the tongue every 5 (five) minutes as needed for chest pain. 09/29/13  Thompson Grayer, MD  Omega-3 Fatty Acids (FISH OIL CONCENTRATE PO) Take 1,700 mg by mouth daily.      Historical Provider, MD  ramipril (ALTACE) 2.5 MG capsule TAKE ONE CAPSULE EVERY DAY 09/29/13   Thompson Grayer, MD  sildenafil (VIAGRA) 100 MG tablet Take 1 tablet (100 mg total) by mouth daily as needed for erectile dysfunction. 08/20/13   [name], MD   BP 149/87  Temp(Src) 97.7 F (36.5 C) (Oral)  Resp 32  SpO2 96%  Physical Exam  Nursing note and vitals reviewed. Constitutional: He is oriented to person, place, and time. He appears well-developed and well-nourished. No distress.  HENT:  Head: Normocephalic and  atraumatic.  Mouth/Throat: Oropharynx is clear and moist.  Eyes: Conjunctivae and EOM are normal. Pupils are equal, round, and reactive to light.  Neck: Normal range of motion. Neck supple.  Cardiovascular: Normal rate, regular rhythm and normal heart sounds.   Pulmonary/Chest: Effort normal and breath sounds normal. No respiratory distress. He has no wheezes. He has no rhonchi.  Respirations unlabored, no wheezes or rhonchi noted  Abdominal: Soft. Bowel sounds are normal. There is no tenderness. There is no guarding.  Musculoskeletal: Normal range of motion. He exhibits no edema.  Trace edema bilateral ankles No calf asymmetry, tenderness, or palpable cords; no overlying erythema or induration; DP pulses intact BLE  Neurological: He is alert and oriented to person, place, and time.  Skin: Skin is warm and dry. He is not diaphoretic.  Psoriasis rash present across trunk and BUE and BLE  Psychiatric: He has a normal mood and affect.    ED Course  Procedures (including critical care time)  CRITICAL CARE Performed by: Larene Pickett   Total critical care time: 45  Critical care time was exclusive of separately billable procedures and treating other patients.  Critical care was necessary to treat or prevent imminent or life-threatening deterioration.  Critical care was time spent personally by me on the following activities: development of treatment plan with patient and/or surrogate as well as nursing, discussions with consultants, evaluation of patient's response to treatment, examination of patient, obtaining history from patient or surrogate, ordering and performing treatments and interventions, ordering and review of laboratory studies, ordering and review of radiographic studies, pulse oximetry and re-evaluation of patient's condition.  Labs Review Labs Reviewed  BASIC METABOLIC PANEL - Abnormal; Notable for the following:    Glucose, Bld 103 (*)    GFR calc non Af Amer 75 (*)     GFR calc Af Amer 87 (*)    Anion gap 19 (*)    All other components within normal limits  PRO B NATRIURETIC PEPTIDE - Abnormal; Notable for the following:    Pro B Natriuretic peptide (BNP) 3523.0 (*)    All other components within normal limits  CBC WITH DIFFERENTIAL - Abnormal; Notable for the following:    Eosinophils Relative 7 (*)    All other components within normal limits  I-STAT TROPOININ, ED - Abnormal; Notable for the following:    Troponin i, poc 0.35 (*)    All other components within normal limits  MRSA PCR SCREENING  CBC  HEPARIN LEVEL (UNFRACTIONATED)  CBC  TROPONIN I  TROPONIN I  TROPONIN I  TSH  COMPREHENSIVE METABOLIC PANEL  LIPID PANEL    Imaging Review Dg Chest Port 1 View  03/14/2014   CLINICAL DATA:  Difficulty breathing  EXAM: PORTABLE CHEST - 1 VIEW  COMPARISON:  June 12, 2009  FINDINGS: Lungs are clear. Heart is mildly enlarged with pulmonary vascularity within normal limits. No adenopathy. Patient is status post internal mammary bypass grafting. No adenopathy. No bone lesions.  IMPRESSION: Mild cardiomegaly.  No edema or consolidation.   Electronically Signed   By: Lowella Grip M.D.   On: 03/14/2014 19:24     EKG Interpretation   Date/Time:  Sunday March 14 2014 19:33:56 EDT Ventricular Rate:  122 PR Interval:  102 QRS Duration: 104 QT Interval:  426 QTC Calculation: 607 R Axis:   15 Text Interpretation:  Ectopic atrial tachycardia, unifocal Inferior  infarct, acute (LCx) Prolonged QT interval No significant change since  last tracing Confirmed by YAO  MD, DAVID ([id number]) on 03/14/2014 7:46:00 PM      MDM   Final diagnoses:  NSTEMI (non-ST elevated myocardial infarction)  Unstable angina   63 year old male with exertional shortness of breath and lightheadedness throughout the day stay. Denies any chest pain.  EKG with inferior ST elevation, not meeting STEMI criteria.  Troponin elevated at 0.35, patient started on heparin.   Remainder of lab work is reassuring.  CXR clear.  Cardiology was consulted, has evaluated patient in the ED and feels sx due to atrial flutter, will admit for serial troponins, possible cardioversion tomorrow.  Patient transferred to [facility] via [facility].  EDP, Dr. Vanita Panda notified of transfer.  Larene Pickett, PA-C 03/14/14 2356

## 2014-03-14 NOTE — Progress Notes (Signed)
CRITICAL VALUE ALERT  Critical value received:  Trop 0.78  Date of notification:  03/14/14  Time of notification:  5170  Critical value read back:Yes.    Nurse who received alert:  Reinaldo Berber RN  MD notified (1st page):  Dr. Wynonia Lawman  Time of first page:  03/14/14  MD notified (2nd page):  Time of second page:  Responding MD:  Dr. Wynonia Lawman  Time MD responded: 2300

## 2014-03-15 ENCOUNTER — Encounter (HOSPITAL_COMMUNITY): Payer: Medicare Other | Admitting: Anesthesiology

## 2014-03-15 ENCOUNTER — Inpatient Hospital Stay (HOSPITAL_COMMUNITY): Payer: Medicare Other | Admitting: Certified Registered Nurse Anesthetist

## 2014-03-15 ENCOUNTER — Encounter (HOSPITAL_COMMUNITY)
Admission: EM | Disposition: A | Discharge status: Home / Self Care | Payer: Medicare Other | Source: Home / Self Care | Attending: Cardiology

## 2014-03-15 ENCOUNTER — Encounter (HOSPITAL_COMMUNITY): Payer: Medicare Other | Admitting: Certified Registered Nurse Anesthetist

## 2014-03-15 ENCOUNTER — Encounter (HOSPITAL_COMMUNITY)
Admission: EM | Disposition: A | Discharge status: Home / Self Care | Payer: Self-pay | Source: Home / Self Care | Attending: Cardiology

## 2014-03-15 ENCOUNTER — Inpatient Hospital Stay (HOSPITAL_COMMUNITY): Payer: Medicare Other | Admitting: Anesthesiology

## 2014-03-15 ENCOUNTER — Encounter (HOSPITAL_COMMUNITY): Payer: Self-pay | Admitting: Certified Registered Nurse Anesthetist

## 2014-03-15 DIAGNOSIS — I214 Non-ST elevation (NSTEMI) myocardial infarction: Secondary | ICD-10-CM

## 2014-03-15 DIAGNOSIS — I10 Essential (primary) hypertension: Secondary | ICD-10-CM | POA: Diagnosis present

## 2014-03-15 DIAGNOSIS — I4892 Unspecified atrial flutter: Principal | ICD-10-CM

## 2014-03-15 HISTORY — PX: TEE WITHOUT CARDIOVERSION: SHX5443

## 2014-03-15 HISTORY — PX: CARDIOVERSION: SHX1299

## 2014-03-15 LAB — LIPID PANEL
CHOLESTEROL: 156 mg/dL (ref 0–200)
HDL: 79 mg/dL (ref >39)
LDL Cholesterol: 63 mg/dL (ref 0–99)
TRIGLYCERIDES: 69 mg/dL (ref <150)
Total CHOL/HDL Ratio: 2 RATIO
VLDL: 14 mg/dL (ref 0–40)

## 2014-03-15 LAB — CBC
HCT: 42.7 % (ref 39.0–52.0)
Hemoglobin: 15.1 g/dL (ref 13.0–17.0)
MCH: 31.7 pg (ref 26.0–34.0)
MCHC: 35.4 g/dL (ref 30.0–36.0)
MCV: 89.7 fL (ref 78.0–100.0)
Platelets: 161 10*3/uL (ref 150–400)
RBC: 4.76 MIL/uL (ref 4.22–5.81)
RDW: 12.9 % (ref 11.5–15.5)
WBC: 6 10*3/uL (ref 4.0–10.5)

## 2014-03-15 LAB — HEPARIN LEVEL (UNFRACTIONATED)
HEPARIN UNFRACTIONATED: 0.43 [IU]/mL (ref 0.30–0.70)
Heparin Unfractionated: 0.44 IU/mL (ref 0.30–0.70)

## 2014-03-15 LAB — TROPONIN I
TROPONIN I: 0.59 ng/mL — AB (ref <0.30)
Troponin I: 0.34 ng/mL (ref <0.30)

## 2014-03-15 SURGERY — ECHOCARDIOGRAM, TRANSESOPHAGEAL
Anesthesia: Monitor Anesthesia Care

## 2014-03-15 SURGERY — ECHOCARDIOGRAM, TRANSESOPHAGEAL
Anesthesia: General

## 2014-03-15 MED ORDER — FENTANYL CITRATE 0.05 MG/ML IJ SOLN
INTRAMUSCULAR | Status: DC | PRN
  Administered 2014-03-15: 50 ug via INTRAVENOUS
  Administered 2014-03-15: 25 ug via INTRAVENOUS

## 2014-03-15 MED ORDER — PHENYLEPHRINE HCL 10 MG/ML IJ SOLN
INTRAMUSCULAR | Status: DC | PRN
  Administered 2014-03-15 (×4): 80 ug via INTRAVENOUS

## 2014-03-15 MED ORDER — SODIUM CHLORIDE 0.9 % IV SOLN: INTRAVENOUS | Status: DC

## 2014-03-15 MED ORDER — SODIUM CHLORIDE 0.9 % IV SOLN
INTRAVENOUS | Status: DC | PRN
  Administered 2014-03-15: 13:00:00 via INTRAVENOUS

## 2014-03-15 MED ORDER — BUTAMBEN-TETRACAINE-BENZOCAINE 2-2-14 % EX AERO
INHALATION_SPRAY | CUTANEOUS | Status: DC | PRN
  Administered 2014-03-15: 2 via TOPICAL

## 2014-03-15 MED ORDER — FENTANYL CITRATE 0.05 MG/ML IJ SOLN
INTRAMUSCULAR | Status: AC
  Filled 2014-03-15: qty 2

## 2014-03-15 MED ORDER — MIDAZOLAM HCL 5 MG/ML IJ SOLN
INTRAMUSCULAR | Status: AC
  Filled 2014-03-15: qty 1

## 2014-03-15 MED ORDER — PROPOFOL INFUSION 10 MG/ML OPTIME
INTRAVENOUS | Status: DC | PRN
  Administered 2014-03-15: 100 ug/kg/min via INTRAVENOUS

## 2014-03-15 MED ORDER — MIDAZOLAM HCL 5 MG/5ML IJ SOLN
INTRAMUSCULAR | Status: DC | PRN
  Administered 2014-03-15 (×2): 1 mg via INTRAVENOUS

## 2014-03-15 MED ORDER — SODIUM CHLORIDE 0.9 % IV SOLN
INTRAVENOUS | Status: DC
  Administered 2014-03-14: 22:00:00 via INTRAVENOUS

## 2014-03-15 NOTE — Anesthesia Postprocedure Evaluation (Signed)
Anesthesia Post Note  Patient: [name] [name]  Procedure(s) Performed: Procedure(s) (LRB): TRANSESOPHAGEAL ECHOCARDIOGRAM (TEE) (N/A) CARDIOVERSION (N/A)  Anesthesia type: MAC  Patient location: PACU  Post pain: Pain level controlled  Post assessment: Post-op Vital signs reviewed  Last Vitals: BP 110/78  Pulse 57  Temp(Src) 36.3 C (Oral)  Resp 17  Ht 6\' 1"  (1.854 m)  Wt 216 lb 14.9 oz (98.4 kg)  BMI 28.63 kg/m2  SpO2 96%  Post vital signs: Reviewed  Level of consciousness: awake  Complications: No apparent anesthesia complications

## 2014-03-15 NOTE — Consult Note (Signed)
ELECTROPHYSIOLOGY CONSULT NOTE  Patient ID: [name] [name], MRN: [id number], DOB/AGE: June 24, 1951 63 y.o. Admit date: 03/14/2014 Date of Consult: 03/15/2014  Primary Physician: [name], MD Primary Cardiologist: [name] [name]  Chief Complaint: ATrial flutter   HPI [name] [name] is a 62 y.o. male  Admitted yesterday with +  TN and Atrial flutter -atypical with 2:1 conduction  Associated with tachypalpitations and dyspnea on exertion.  He has repeated brief episodes over the last few months.  Those spells as like yesterday were perceived as irregular and rapid  TERF +HTN, AGE CAD  He is s/p CABG 2005 with normal stress echo 2 yrs ago  He has had no recurrent angina  + Tn  Past Medical History  Diagnosis Date  . Coronary artery disease     Severe three-vessel coronary artery disease with ejection fraction of 65%  . History of psoriasis   . History of gout   . SOB (shortness of breath)     Mild  . Hypercholesterolemia   . Arthritis   . Chicken pox   . GERD (gastroesophageal reflux disease)   . Hypertension       Surgical History:  Past Surgical History  Procedure Laterality Date  . Tonsillectomy      Remote history of tonsillectomy.  . Cardiac catheterization  09/2003    Ejection fraction was estimated at 65%.  . Coronary artery bypass graft      Lima-lad,svg-diag,svg-om/distal LCX,svg-pda     Home Meds: Prior to Admission medications   Medication Sig Start Date End Date Taking? Authorizing Provider  aspirin 325 MG EC tablet Take 325 mg by mouth daily.   Yes Historical Provider, MD  atorvastatin (LIPITOR) 40 MG tablet Take 40 mg by mouth daily.   Yes Historical Provider, MD  Borage, Borago officinalis, (BORAGE OIL) 1000 MG CAPS Take 1,000 mg by mouth daily.   Yes Historical Provider, MD  cholecalciferol (VITAMIN D) 1000 UNITS tablet Take 1,000 Units by mouth daily.     Yes Historical Provider, MD  Coenzyme Q10 (CO Q 10 PO) Take 100 mg by mouth daily.    Yes  Historical Provider, MD  colchicine 0.6 MG tablet Take 0.6 mg by mouth as needed (for gout).  08/20/13  Yes [name], MD  methotrexate (RHEUMATREX) 2.5 MG tablet Take 2.5 mg by mouth once a week. Caution:Chemotherapy. Protect from light.   Yes Historical Provider, MD  metoprolol succinate (TOPROL-XL) 25 MG 24 hr tablet Take 1 tablet (25 mg total) by mouth daily. 12/16/12  Yes Peter M Martinique, MD  multivitamin [facility]) per tablet Take 1 tablet by mouth daily.     Yes Historical Provider, MD  Omega-3 Fatty Acids (FISH OIL CONCENTRATE PO) Take 1,700 mg by mouth daily.     Yes Historical Provider, MD  ramipril (ALTACE) 2.5 MG capsule Take 2.5 mg by mouth daily.   Yes Historical Provider, MD  sildenafil (VIAGRA) 100 MG tablet Take 1 tablet (100 mg total) by mouth daily as needed for erectile dysfunction. 08/20/13  Yes [name], MD  nitroGLYCERIN (NITROSTAT) 0.4 MG SL tablet Place 1 tablet (0.4 mg total) under the tongue every 5 (five) minutes as needed for chest pain. 09/29/13   Thompson Grayer, MD    Inpatient Medications:  . antiseptic oral rinse  7 mL Mouth Rinse BID  . aspirin EC  81 mg Oral Daily  . atorvastatin  40 mg Oral q1800  . colchicine  0.6 mg Oral BID  .  metoprolol succinate  25 mg Oral Daily  . ramipril  2.5 mg Oral Daily     Allergies: No Known Allergies  History   Social History  . Marital Status: Married    Spouse Name: N/A    Number of Children: 1  . Years of Education: N/A   Occupational History  . financial     Social History Main Topics  . Smoking status: Former Research scientist (life sciences)  . Smokeless tobacco: Not on file  . Alcohol Use: Yes  . Drug Use: No  . Sexual Activity: Yes   Other Topics Concern  . Not on file   Social History Narrative  . No narrative on file     Family History  Problem Relation Age of Onset  . Heart disease Mother     cabg  . Cancer Mother     breast  . Stroke Mother      ROS:  Please see the history of present illness.      All other systems reviewed and negative.    Physical Exam: Blood pressure 98/50, pulse 58, temperature 97.8 F (36.6 C), temperature source Oral, resp. rate 85, height 6\' 1"  (1.854 m), weight 216 lb 14.9 oz (98.4 kg), SpO2 100.00%. General: Well developed, well nourished male in no acute distress. Head: Normocephalic, atraumatic, sclera non-icteric, no xanthomas, nares are without discharge. EENT: normal Lymph Nodes:  none Back: without scoliosis/kyphosis *, no CVA tendersness Neck: Negative for carotid bruits. JVD not elevated. Lungs: Clear bilaterally to auscultation without wheezes, rales, or rhonchi. Breathing is unlabored. Heart: RRR with S1 S2. No  murmur , rubs, or gallops appreciated. Abdomen: Soft, non-tender, non-distended with normoactive bowel sounds. No hepatomegaly. No rebound/guarding. No obvious abdominal masses. Msk:  Strength and tone appear normal for age. Extremities: No clubbing or cyanosis. No  edema.  Distal pedal pulses are 2+ and equal bilaterally. Skin: Warm and Dry Neuro: Alert and oriented X 3. CN III-XII intact Grossly normal sensory and motor function . Psych:  Responds to questions appropriately with a normal affect.      Labs: Cardiac Enzymes  Recent Labs  03/14/14 2212 03/15/14 0237 03/15/14 0859  TROPONINI 0.74* 0.59* 0.34*   CBC Lab Results  Component Value Date   WBC 6.0 03/15/2014   HGB 15.1 03/15/2014   HCT 42.7 03/15/2014   MCV 89.7 03/15/2014   PLT 161 03/15/2014   PROTIME: No results found for this basename: LABPROT, INR,  in the last 72 hours Chemistry  Recent Labs Lab 03/14/14 2212  NA 141  K 4.0  CL 108  CO2 19  BUN 15  CREATININE 0.82  CALCIUM 8.7  PROT 6.1  BILITOT 0.4  ALKPHOS 67  ALT 40  AST 38*  GLUCOSE 111*   Lipids Lab Results  Component Value Date   CHOL 156 03/15/2014   HDL 79 03/15/2014   LDLCALC 63 03/15/2014   TRIG 69 03/15/2014   BNP Pro B Natriuretic peptide (BNP)  Date/Time Value Ref Range Status   03/14/2014  7:02 PM 3523.0* 0 - 125 pg/mL Final   Miscellaneous No results found for this basename: DDIMER    Radiology/Studies:  Dg Chest Port 1 View  03/14/2014   CLINICAL DATA:  Difficulty breathing  EXAM: PORTABLE CHEST - 1 VIEW  COMPARISON:  June 12, 2009  FINDINGS: Lungs are clear. Heart is mildly enlarged with pulmonary vascularity within normal limits. No adenopathy. Patient is status post internal mammary bypass grafting. No adenopathy. No bone lesions.  IMPRESSION: Mild cardiomegaly.  No edema or consolidation.   Electronically Signed   By: Lowella Grip M.D.   On: 03/14/2014 19:24    EKG: atiral flutter with atypical pattern, upright inferior leads and neg in V1   Assessment and Plan:   Atrial flutter-atypical ? Reverse typical CHADSVASc 3 (age,HTN CAD)  Pt underwent TEEDCCV today   I would recommend EPS RFCA for Flutter substrate, and this may be reverse typical, but possibly not.  Suggest then that [facility] see him as he can use CARTO if necessary   We have discussed the possibility of atrial fibrillation the risks and benefits of the procedure and anticoagulation  I would favor a NOAC for 4 weeks and then to undergo EPS as outlined above  Spoke with Dr Shirlee More and thought outpt stress testing made sense as 2 yrs since last stress test, 63 yr old grafts and + TN  I have spoken with Dr Greggory Brandy as well  Virl Axe

## 2014-03-15 NOTE — Transfer of Care (Signed)
Immediate Anesthesia Transfer of Care Note  Patient: [name] [name]  Procedure(s) Performed: Procedure(s): TRANSESOPHAGEAL ECHOCARDIOGRAM (TEE) (N/A) CARDIOVERSION (N/A)  Patient Location: PACU  Anesthesia Type:MAC  Level of Consciousness: awake and alert   Airway & Oxygen Therapy: Patient Spontanous Breathing and Patient connected to nasal cannula oxygen  Post-op Assessment: Report given to PACU RN and Post -op Vital signs reviewed and stable  Post vital signs: Reviewed and stable  Complications: No apparent anesthesia complications

## 2014-03-15 NOTE — Progress Notes (Signed)
ANTICOAGULATION CONSULT NOTE - Follow Up Consult  Pharmacy Consult:  HEparin Indication: chest pain/ACS + Aflutter  No Known Allergies  Patient Measurements: Height: 6\' 1"  (185.4 cm) Weight: 216 lb 14.9 oz (98.4 kg) IBW/kg (Calculated) : 79.9 Heparin Dosing Weight: 96 kg  Vital Signs: Temp: 98 F (36.7 C) (08/24 0719) Temp src: Oral (08/24 0719) BP: 139/95 mmHg (08/24 0900) Pulse Rate: 119 (08/24 0930)  Labs:  Recent Labs  03/14/14 1902 03/14/14 2212 03/15/14 0237 03/15/14 0900  HGB 16.7 14.7 15.1  --   HCT 46.8 41.9 42.7  --   PLT 192 154 161  --   HEPARINUNFRC  --   --  0.43 0.44  CREATININE 1.02 0.82  --   --   TROPONINI  --  0.74* 0.59*  --     Estimated Creatinine Clearance: 110.9 ml/min (by C-G formula based on Cr of 0.82).      Assessment: 42 YOM with possible ACS and Aflutter to continue on IV heparin.  Confirmatory heparin level is therapeutic; no bleeding reported. Possible DCCV tomorrow.   Goal of Therapy:  Heparin level 0.3-0.7 units/ml Monitor platelets by anticoagulation protocol: Yes    Plan:  - Continue heparin gtt at 1200 units/hr - Daily HL / CBC    Kirsten Spearing D. [name], PharmD, BCPS Pager:  [phone] 03/15/2014, 10:27 AM

## 2014-03-15 NOTE — Anesthesia Preprocedure Evaluation (Addendum)
Anesthesia Evaluation  Patient identified by MRN, date of birth, ID band Patient awake    Reviewed: Allergy & Precautions, H&P , NPO status , Patient's Chart, lab work & pertinent test results  History of Anesthesia Complications Negative for: history of anesthetic complications  Airway Mallampati: II TM Distance: >3 FB Neck ROM: Full    Dental no notable dental hx.    Pulmonary shortness of breath and with exertion, former smoker,  breath sounds clear to auscultation  Pulmonary exam normal       Cardiovascular hypertension, Pt. on medications + CAD + dysrhythmias Atrial Fibrillation Rhythm:Regular Rate:Normal     Neuro/Psych    GI/Hepatic Neg liver ROS, GERD-  Medicated,  Endo/Other  negative endocrine ROS  Renal/GU negative Renal ROS     Musculoskeletal   Abdominal   Peds  Hematology negative hematology ROS (+)   Anesthesia Other Findings   Reproductive/Obstetrics                         Anesthesia Physical Anesthesia Plan  ASA: III  Anesthesia Plan: General   Post-op Pain Management:    Induction: Intravenous  Airway Management Planned: Nasal Cannula  Additional Equipment:   Intra-op Plan:   Post-operative Plan:   Informed Consent: I have reviewed the patients History and Physical, chart, labs and discussed the procedure including the risks, benefits and alternatives for the proposed anesthesia with the patient or authorized representative who has indicated his/her understanding and acceptance.   Dental advisory given  Plan Discussed with: CRNA  Anesthesia Plan Comments:        Anesthesia Quick Evaluation

## 2014-03-15 NOTE — Progress Notes (Signed)
Cardizem drip discontinue per protocol heart 54 SB

## 2014-03-15 NOTE — CV Procedure (Signed)
See full TEE report in [employer]; normal LV function; mild biatrial enlargement; no LAA thrombus. Patient subsequently sedated by anesthesia with versed 2 mg, fentanyl 75 micrograms and diprovan 450 mg IV. Synchronized DCCV with 120 J resulted in sinus rhythm. No immediate complications. Continue heparin. Anticoagulation per primary team [name] [name]

## 2014-03-15 NOTE — Progress Notes (Addendum)
Patient Name: [name] [name] Date of Encounter: 03/15/2014  Principal Problem:   Atrial flutter Active Problems:   Coronary artery disease   Hypercholesterolemia   Hypertension    Patient Profile: 63 yo male w/ hx CABG 2005, no cath since, last stress 2013 (OK), HLD, HTN, GERD, OA, psoriasis, who was admitted 08/23 with palpitations, elev ez.  SUBJECTIVE: No chest pain, no SOB, feels better when HR controlled.   OBJECTIVE Filed Vitals:   03/15/14 0500 03/15/14 0600 03/15/14 0700 03/15/14 0719  BP: 136/92 129/64 103/91   Pulse:  116 117   Temp:    98 F (36.7 C)  TempSrc:    Oral  Resp: 22 19 19    Height:      Weight:      SpO2: 96% 97% 97%     Intake/Output Summary (Last 24 hours) at 03/15/14 9024 Last data filed at 03/15/14 0700  Gross per 24 hour  Intake    537 ml  Output   1700 ml  Net  -1163 ml   Filed Weights   03/14/14 1907 03/14/14 2100  Weight: 210 lb 12.2 oz (95.6 kg) 216 lb 14.9 oz (98.4 kg)    PHYSICAL EXAM General: Well developed, well nourished, male in no acute distress. Head: Normocephalic, atraumatic.  Neck: Supple without bruits, JVD not elevated. Lungs:  Resp regular and unlabored, CTA. Heart: Rapid and irregular, S1, S2, no S3, S4, or murmur; no rub. Abdomen: Soft, non-tender, non-distended, BS + x 4.  Extremities: No clubbing, cyanosis, no edema.  Neuro: Alert and oriented X 3. Moves all extremities spontaneously. Psych: Normal affect.  LABS: CBC:  Recent Labs  03/14/14 2212 03/15/14 0237  WBC 6.4 6.0  NEUTROABS 3.3  --   HGB 14.7 15.1  HCT 41.9 42.7  MCV 89.5 89.7  PLT 154 161   INR:No results found for this basename: INR,  in the last 72 hours Basic Metabolic Panel:  Recent Labs  03/14/14 1902 03/14/14 2212  NA 139 141  K 4.0 4.0  CL 100 108  CO2 20 19  GLUCOSE 103* 111*  BUN 18 15  CREATININE 1.02 0.82  CALCIUM 9.8 8.7   Liver Function Tests:  Recent Labs  03/14/14 2212  AST 38*  ALT 40  ALKPHOS  67  BILITOT 0.4  PROT 6.1  ALBUMIN 3.4*   Cardiac Enzymes:  Recent Labs  03/14/14 2212 03/15/14 0237  TROPONINI 0.74* 0.59*    Recent Labs  03/14/14 1911  TROPIPOC 0.35*   BNP: Pro B Natriuretic peptide (BNP)  Date/Time Value Ref Range Status  03/14/2014  7:02 PM 3523.0* 0 - 125 pg/mL Final   Hemoglobin A1C:No results found for this basename: HGBA1C,  in the last 72 hours Fasting Lipid Panel:  Recent Labs  03/15/14 0237  CHOL 156  HDL 79  LDLCALC 63  TRIG 69  CHOLHDL 2.0   Thyroid Function Tests:  Recent Labs  03/14/14 2212  TSH 1.400    TELE:   Atrial flutter, generally RVR  Radiology/Studies: Dg Chest Port 1 View 03/14/2014   CLINICAL DATA:  Difficulty breathing  EXAM: PORTABLE CHEST - 1 VIEW  COMPARISON:  June 12, 2009  FINDINGS: Lungs are clear. Heart is mildly enlarged with pulmonary vascularity within normal limits. No adenopathy. Patient is status post internal mammary bypass grafting. No adenopathy. No bone lesions.  IMPRESSION: Mild cardiomegaly.  No edema or consolidation.   Electronically Signed   By: Lowella Grip M.D.  On: 03/14/2014 19:24     Current Medications:  . antiseptic oral rinse  7 mL Mouth Rinse BID  . aspirin EC  81 mg Oral Daily  . atorvastatin  40 mg Oral q1800  . colchicine  0.6 mg Oral BID  . metoprolol succinate  25 mg Oral Daily  . ramipril  2.5 mg Oral Daily   . sodium chloride 10 mL/hr at 03/14/14 2200  . diltiazem (CARDIZEM) infusion 10 mg/hr (03/14/14 2019)  . diltiazem (CARDIZEM) infusion 10 mg/hr (03/15/14 0600)  . heparin 1,200 Units/hr (03/14/14 2100)    ASSESSMENT AND PLAN: Principal Problem:   Atrial flutter - TEE/DCCV when schedule permits, may be tomorrow, discuss EP consult with MD.   Active Problems:   Coronary artery disease - NSTEMI by ez, no ischemic symtpoms, ?type 2; on ASA, BB, ACE, statin. MD advise on ischemic eval and its timing.    Hypercholesterolemia - see profile above, continue  statin    Hypertension - OK on current rx, follow.   Signed, Rosaria Ferries , PA-C 8:12 AM 03/15/2014  Patient seen, examined. Available data reviewed. Agree with findings, assessment, and plan as outlined by Rosaria Ferries, PA-C. Reviewed records and patient history carefully with the patient and his wife. He has been doing well until a few days ago and he denies any anginal symptoms. Exam reveals alert, oriented male in NAD. Lungs CTA, heart irregularly irregular without murmur or gallop, no peripheral edema. I agree with plans for TEE/cardioversion today. I have reviewed risks and rationale for this procedure with the patient who understands and agrees to proceed. Regarding his minimal troponin elevation, I suspect this is related to demand ischemia in the setting of AF with RVR. I recommend that we arrange and outpatient stress Myoview after his rhythm issues are stabilized. We briefly discussed anticoagulation options, and will consider transitioning him to a NOAC from IV heparin tomorrow.  Sherren Mocha, M.D. 03/15/2014 12:20 PM

## 2014-03-15 NOTE — Progress Notes (Signed)
EKG CRITICAL VALUE     12 lead EKG performed.  Long QTc noted.  Candyce Churn, RN notified.   Evalee Mutton, CCT 03/15/2014 6:50 AM

## 2014-03-15 NOTE — Progress Notes (Signed)
ANTICOAGULATION CONSULT NOTE - Follow Up Consult  Pharmacy Consult for heparin Indication: chest pain/ACS and Aflutter  Labs:  Recent Labs  03/14/14 1902 03/14/14 2212 03/15/14 0237  HGB 16.7 14.7 15.1  HCT 46.8 41.9 42.7  PLT 192 154 161  HEPARINUNFRC  --   --  0.43  CREATININE 1.02 0.82  --   TROPONINI  --  0.74*  --     Assessment/Plan:  63yo male therapeutic on heparin with initial dosing for CP w/ Aflutter. Will continue gtt at current rate and confirm stable with additional level.   Wynona Neat, PharmD, BCPS  03/15/2014,3:22 AM

## 2014-03-15 NOTE — Progress Notes (Signed)
  Echocardiogram Echocardiogram Transesophageal has been performed.  [name] [name] 03/15/2014, 2:07 PM

## 2014-03-15 NOTE — Progress Notes (Signed)
Utilization review completed. Morgen Ritacco, RN, BSN. 

## 2014-03-16 ENCOUNTER — Encounter (HOSPITAL_COMMUNITY): Payer: Self-pay | Admitting: Cardiology

## 2014-03-16 LAB — CBC
HEMATOCRIT: 42.3 % (ref 39.0–52.0)
Hemoglobin: 14.8 g/dL (ref 13.0–17.0)
MCH: 31.6 pg (ref 26.0–34.0)
MCHC: 35 g/dL (ref 30.0–36.0)
MCV: 90.2 fL (ref 78.0–100.0)
Platelets: 136 10*3/uL — ABNORMAL LOW (ref 150–400)
RBC: 4.69 MIL/uL (ref 4.22–5.81)
RDW: 12.7 % (ref 11.5–15.5)
WBC: 6.6 10*3/uL (ref 4.0–10.5)

## 2014-03-16 LAB — HEPARIN LEVEL (UNFRACTIONATED): Heparin Unfractionated: 0.33 IU/mL (ref 0.30–0.70)

## 2014-03-16 MED ORDER — APIXABAN 5 MG PO TABS: 5.0000 mg | ORAL_TABLET | Freq: Two times a day (BID) | ORAL | Status: DC

## 2014-03-16 MED ORDER — ASPIRIN 81 MG PO TBEC
81.0000 mg | DELAYED_RELEASE_TABLET | Freq: Every day | ORAL | Status: DC
Start: 2014-03-16 — End: 2017-07-03

## 2014-03-16 MED ORDER — METOPROLOL SUCCINATE ER 50 MG PO TB24
50.0000 mg | ORAL_TABLET | Freq: Every day | ORAL | Status: DC
  Administered 2014-03-16: 50 mg via ORAL
  Filled 2014-03-16: qty 1

## 2014-03-16 MED ORDER — METOPROLOL SUCCINATE ER 50 MG PO TB24: 50.0000 mg | ORAL_TABLET | Freq: Every day | ORAL | Status: DC

## 2014-03-16 MED ORDER — APIXABAN 5 MG PO TABS
5.0000 mg | ORAL_TABLET | Freq: Two times a day (BID) | ORAL | Status: DC
  Administered 2014-03-16: 5 mg via ORAL
  Filled 2014-03-16 (×2): qty 1

## 2014-03-16 NOTE — Progress Notes (Signed)
    Subjective:  No CP or dyspnea. Feels well this am. Walked yesterday without symptoms.  Objective:  Vital Signs in the last 24 hours: Temp:  [97.4 F (36.3 C)-98.1 F (36.7 C)] 98 F (36.7 C) (08/25 0710) Pulse Rate:  [49-120] 69 (08/25 0700) Resp:  [7-85] 18 (08/25 0700) BP: (92-158)/(44-95) 147/62 mmHg (08/25 0700) SpO2:  [95 %-100 %] 97 % (08/25 0700)  Intake/Output from previous day: 08/24 0701 - 08/25 0700 In: 828 [P.O.:240; I.V.:588] Out: 2200 [Urine:2200]  Physical Exam: Pt is alert and oriented, NAD HEENT: normal Neck: JVP - normal, carotids 2+= without bruits Lungs: CTA bilaterally CV: RRR without murmur or gallop Abd: soft, NT, Positive BS, no hepatomegaly Ext: no C/C/E, distal pulses intact and equal Skin: warm/dry no rash   Lab Results:  Recent Labs  03/15/14 0237 03/16/14 0423  WBC 6.0 6.6  HGB 15.1 14.8  PLT 161 136*    Recent Labs  03/14/14 1902 03/14/14 2212  NA 139 141  K 4.0 4.0  CL 100 108  CO2 20 19  GLUCOSE 103* 111*  BUN 18 15  CREATININE 1.02 0.82    Recent Labs  03/15/14 0237 03/15/14 0859  TROPONINI 0.59* 0.34*    Cardiac Studies: TEE: no atrial thrombus  Tele: Sinus brady, now sinus rhythm  Assessment/Plan:  1. Atrial flutter, atypical with RVR, now in NSR after DCCV 2. Known CAD with elevated troponin in setting AF with RVR. No anginal symptoms 3. Hypercholesterolemia 4. HTN, controlled  Appreciate EP consult per Dr Renaldo Reel. Spoke with Dr [name] this am who will see the patient in his office for consultation in about 2 weeks. Anticipate d/c home today on Eliquis. Will increase Toprol XL to 50 mg. Will schedule exercise Myoview as outpatient to eval mild troponin elevation. Anticipate flutter ablation in about 4 weeks.  Appointment scheduled with Dr [name] 9/2 at 12:00  Sherren Mocha, M.D. 03/16/2014, 8:13 AM

## 2014-03-16 NOTE — Discharge Summary (Signed)
CARDIOLOGY DISCHARGE SUMMARY   Patient ID: [name] [name] MRN: [id number] DOB/AGE: 1951/06/17 63 y.o.  Admit date: 03/14/2014 Discharge date: 03/16/2014  PCP: [name], MD Primary Cardiologist: [name]  Primary Discharge Diagnosis:   Atrial flutter - TEE/DCCV this admit  Secondary Discharge Diagnosis:    Coronary artery disease   Hypercholesterolemia   Hypertension  Consults: Electrophysiology  Procedures: TEE, DCCV  Hospital Course: CHERRY WITTWER is a 63 y.o. male with a history of CAD who came to the hospital with palpitations and was admitted with atrial flutter, rapid ventricular response.  The atrial fibrillation was of unknown duration, so he was rate-controlled with IV Cardizem and a TEE-cardioversion was scheduled.   His cardiac enzymes were cycled and were elevated. This was felt to be most likely a type II non-STEMI from the rapid atrial fibrillation. He did not have chest pain or shortness of breath. His ECG had no acute ischemic changes.   An EP consult was called to help guide the sequence of therapies. Because of his history of coronary artery disease and remote bypass surgery, he will get an ischemic evaluation, but this can be performed as an outpatient. He will be systemically anticoagulated. He will be evaluated by EP after discharge and scheduled for ablation after being anticoagulated for 4 weeks.  The TEE cardioversion was performed on 08/24. He tolerated the procedure well and was successfully converted to sinus rhythm. TEE results are below. He has a probable patent PFO, and his EF is preserved.  On 08/25, Mr. Borawski was seen by Dr. Burt Knack and all data were reviewed. He was maintaining sinus rhythm and otherwise had no concerns. He was transitioned from heparin to Eliquis for anticoagulation. No further inpatient workup is indicated and he is considered stable for discharge, to follow up as an outpatient.  Labs:   Lab Results  Component  Value Date   WBC 6.6 03/16/2014   HGB 14.8 03/16/2014   HCT 42.3 03/16/2014   MCV 90.2 03/16/2014   PLT 136* 03/16/2014     Recent Labs Lab 03/14/14 2212  NA 141  K 4.0  CL 108  CO2 19  BUN 15  CREATININE 0.82  CALCIUM 8.7  PROT 6.1  BILITOT 0.4  ALKPHOS 67  ALT 40  AST 38*  GLUCOSE 111*    Recent Labs  03/14/14 2212 03/15/14 0237 03/15/14 0859  TROPONINI 0.74* 0.59* 0.34*   Lipid Panel     Component Value Date/Time   CHOL 156 03/15/2014 0237   TRIG 69 03/15/2014 0237   HDL 79 03/15/2014 0237   CHOLHDL 2.0 03/15/2014 0237   VLDL 14 03/15/2014 0237   LDLCALC 63 03/15/2014 0237    Pro B Natriuretic peptide (BNP)  Date/Time Value Ref Range Status  03/14/2014  7:02 PM 3523.0* 0 - 125 pg/mL Final     Radiology: Dg Chest Port 1 View 03/14/2014   CLINICAL DATA:  Difficulty breathing  EXAM: PORTABLE CHEST - 1 VIEW  COMPARISON:  June 12, 2009  FINDINGS: Lungs are clear. Heart is mildly enlarged with pulmonary vascularity within normal limits. No adenopathy. Patient is status post internal mammary bypass grafting. No adenopathy. No bone lesions.  IMPRESSION: Mild cardiomegaly.  No edema or consolidation.   Electronically Signed   By: Lowella Grip M.D.   On: 03/14/2014 19:24   EKG: A 24 2015 Sinus bradycardia, no acute ischemic changes Vent. rate 51 BPM PR interval 126 ms QRS duration 96 ms QT/QTc  480/442 ms P-R-T axes 48 1 72  Echo: 03/15/2014 Conclusions - Left ventricle: Systolic function was normal. The estimated ejection fraction was in the range of 50% to 55%. Wall motion was normal; there were no regional wall motion abnormalities. No evidence of thrombus. - Aortic valve: No evidence of vegetation. There was mild regurgitation. - Mitral valve: No evidence of vegetation. There was mild regurgitation. - Left atrium: The atrium was mildly dilated. No evidence of thrombus in the atrial cavity or appendage. - Right atrium: The atrium was mildly dilated. -  Atrial septum: There was a patent foramen ovale. There was an atrial septal aneurysm. - Tricuspid valve: No evidence of vegetation. - Pulmonic valve: No evidence of vegetation. Impressions: - Low normal LV function; mild biatrial enlargement; mild MR, AI and TR; probable patent foramen ovale by color doppler. Patient subsequently underwent successful DCCV of atrial flutter.   FOLLOW UP PLANS AND APPOINTMENTS No Known Allergies   Medication List         apixaban 5 MG Tabs tablet  Commonly known as:  ELIQUIS  Take 1 tablet (5 mg total) by mouth 2 (two) times daily.     aspirin 81 MG EC tablet  Take 1 tablet (81 mg total) by mouth daily.     atorvastatin 40 MG tablet  Commonly known as:  LIPITOR  Take 40 mg by mouth daily.     Borage Oil 1000 MG Caps  Take 1,000 mg by mouth daily.     cholecalciferol 1000 UNITS tablet  Commonly known as:  VITAMIN D  Take 1,000 Units by mouth daily.     CO Q 10 PO  Take 100 mg by mouth daily.     colchicine 0.6 MG tablet  Take 0.6 mg by mouth as needed (for gout).     FISH OIL CONCENTRATE PO  Take 1,700 mg by mouth daily.     methotrexate 2.5 MG tablet  Commonly known as:  RHEUMATREX  Take 2.5 mg by mouth once a week. Caution:Chemotherapy. Protect from light.     metoprolol succinate 50 MG 24 hr tablet  Commonly known as:  TOPROL-XL  Take 1 tablet (50 mg total) by mouth daily.     multivitamin per tablet  Take 1 tablet by mouth daily.     nitroGLYCERIN 0.4 MG SL tablet  Commonly known as:  NITROSTAT  Place 1 tablet (0.4 mg total) under the tongue every 5 (five) minutes as needed for chest pain.     ramipril 2.5 MG capsule  Commonly known as:  ALTACE  Take 2.5 mg by mouth daily.     sildenafil 100 MG tablet  Commonly known as:  VIAGRA  Take 1 tablet (100 mg total) by mouth daily as needed for erectile dysfunction.        Discharge Instructions   Diet - low sodium heart healthy    Complete by:  As directed       Increase activity slowly    Complete by:  As directed           Follow-up Information   Follow up with [facility]. (Stress test and M.D. appointment, the office will call.)    Contact information:   [place] [phone]       BRING ALL MEDICATIONS WITH YOU TO FOLLOW UP APPOINTMENTS  Time spent with patient to include physician time: 42 min Signed: Rosaria Ferries, PA-C 03/16/2014, 11:43 AM Co-Sign MD

## 2014-03-16 NOTE — Progress Notes (Signed)
ANTICOAGULATION CONSULT NOTE - Follow Up Consult  Pharmacy Consult:  Heparin >> Eliquis Indication:  Non-valvular Afib  No Known Allergies  Patient Measurements: Height: 6\' 1"  (185.4 cm) Weight: 216 lb 14.9 oz (98.4 kg) IBW/kg (Calculated) : 79.9  Vital Signs: Temp: 98 F (36.7 C) (08/25 0710) Temp src: Oral (08/25 0710) BP: 147/62 mmHg (08/25 0700) Pulse Rate: 69 (08/25 0700)  Labs:  Recent Labs  03/14/14 1902 03/14/14 2212 03/15/14 0237 03/15/14 0859 03/15/14 0900 03/16/14 0423  HGB 16.7 14.7 15.1  --   --  14.8  HCT 46.8 41.9 42.7  --   --  42.3  PLT 192 154 161  --   --  136*  HEPARINUNFRC  --   --  0.43  --  0.44 0.33  CREATININE 1.02 0.82  --   --   --   --   TROPONINI  --  0.74* 0.59* 0.34*  --   --     Estimated Creatinine Clearance: 110.9 ml/min (by C-G formula based on Cr of 0.82).      Assessment: 42 YOM with non-valvular AFib to transition from IV heparin to Eliquis.  S/p DCCV yesterday and currently in NSR.  Patient's specific parameter warrants full dosing of Eliquis.  No bleeding reported.   Goal of Therapy:  Full anticoagulation    Plan:  - Eliquis 5mg  PO BID, d/c IV heparin when first dose is administered (RN aware) - Consider checking PRN CBC - Pharmacy will sign off and monitor patient peripherally.  Thank you for the consult! - Consider resuming home meds    Moua Rasmusson D. [name], PharmD, BCPS Pager:  [phone] 03/16/2014, 8:50 AM

## 2014-03-16 NOTE — Care Management Note (Signed)
    Page 1 of 1   03/16/2014     2:42:59 PM CARE MANAGEMENT NOTE 03/16/2014  Patient:  [name] [name], [name] [name]   Account Number:  [id number]  Date Initiated:  03/16/2014  Documentation initiated by:  Coreen Shippee  Subjective/Objective Assessment:   dx AFlutter w/RVR; lives with spouse    PCP  [place]  CM consult  Medication Assistance      Status of service:  Completed, signed off  Medicare Important Message given?  NA - LOS <3 / Initial given by admissions  Discharge Disposition:  HOME/SELF CARE  Per UR Regulation:  Reviewed for med. necessity/level of care/duration of stay  Comments:  03/16/14 [place] Received referral to determine copay for Eliquis 5 mg BID. Obtained information for pt's medicare D plan, CMA will call. 1400 Per [employer], pt's medicare D plan was termed 2/2 to non-payment of premium.  Pt states he had mailed form with voided check so his premium could be deducted from his bank account - per [employer] rep, form was never received and pt will need to call medicare and explain situation to be reinstated.  Discussed alternatives to Eliquis but pt adamant that he prefers Eliquis and is not interested in changing even though a call to his pharmacy reveals a 30-day supply will be over $400.  Provided card for free 30-day supply and advised pt to f/u immediately in getting reinstated in part D plan and determining copay so that he can discuss with cardiologist on his hospital f/u visit - pt agrees.

## 2014-03-17 NOTE — ED Provider Notes (Signed)
Medical screening examination/treatment/procedure(s) were conducted as a shared visit with non-physician practitioner(s) and myself.  I personally evaluated the patient during the encounter.   EKG Interpretation   Date/Time:  Sunday March 14 2014 19:33:56 EDT Ventricular Rate:  122 PR Interval:  102 QRS Duration: 104 QT Interval:  426 QTC Calculation: 607 R Axis:   15 Text Interpretation:  Ectopic atrial tachycardia, unifocal Inferior  infarct, acute (LCx) Prolonged QT interval No significant change since  last tracing Confirmed by Berkley Cronkright  MD, Cagney Degrace ([id number]) on 03/14/2014 7:46:00 PM      [name] [name] is a 63 y.o. male hx of CAD s/p CABg here with shortness of breath, palpitations started earlier in the day. Denies chest pain. He is tachycardic on exam. Lungs clear, no extremity edema. Abdomen nontender. I was called into the room for abnormal EKG. EKG showed undetermined rhythm in 120s with minimal ST elevation inferior leads. I called [name], interventional cardiologist on call, who reviewed the EKGs and felt that he doesn't qualify for STEMI. Patient's troponin came back positive. I started him on heparin drip for presumed NSTEMI. He was also given aspirin. I attempted to slow him down with lopressor with no success. Dr. Wynonia Lawman, cardiologist, came and saw patient. He attempted to slow down his HR with adenosine and saw aflutter. Patient was on cardizem drip and admitted to stepdown.   CRITICAL CARE Performed by: Darl Householder, Iyad Deroo   Total critical care time: 30 min   Critical care time was exclusive of separately billable procedures and treating other patients.  Critical care was necessary to treat or prevent imminent or life-threatening deterioration.  Critical care was time spent personally by me on the following activities: development of treatment plan with patient and/or surrogate as well as nursing, discussions with consultants, evaluation of patient's response to treatment,  examination of patient, obtaining history from patient or surrogate, ordering and performing treatments and interventions, ordering and review of laboratory studies, ordering and review of radiographic studies, pulse oximetry and re-evaluation of patient's condition.    Wandra Arthurs, MD 03/17/14 [phone]

## 2014-03-19 ENCOUNTER — Ambulatory Visit (INDEPENDENT_AMBULATORY_CARE_PROVIDER_SITE_OTHER): Payer: Medicare Other | Admitting: Family Medicine

## 2014-03-19 ENCOUNTER — Encounter: Payer: Self-pay | Admitting: Family Medicine

## 2014-03-19 VITALS — BP 124/80 | HR 78 | Temp 97.9°F | Wt 208.0 lb

## 2014-03-19 DIAGNOSIS — R059 Cough, unspecified: Secondary | ICD-10-CM

## 2014-03-19 DIAGNOSIS — J309 Allergic rhinitis, unspecified: Secondary | ICD-10-CM

## 2014-03-19 DIAGNOSIS — I200 Unstable angina: Secondary | ICD-10-CM

## 2014-03-19 DIAGNOSIS — I4892 Unspecified atrial flutter: Secondary | ICD-10-CM

## 2014-03-19 DIAGNOSIS — R0982 Postnasal drip: Secondary | ICD-10-CM | POA: Diagnosis not present

## 2014-03-19 DIAGNOSIS — R05 Cough: Secondary | ICD-10-CM | POA: Diagnosis not present

## 2014-03-19 DIAGNOSIS — R053 Chronic cough: Secondary | ICD-10-CM

## 2014-03-19 NOTE — Patient Instructions (Signed)
Consider over the counter anti-histamine such as Claritan or Allegra and consider nasal steroid such as Flonase or Nasacort. If cough persists, discuss possible change from Ramipril to another medication.

## 2014-03-19 NOTE — Progress Notes (Signed)
Subjective:    Patient ID: [name] [name], male    DOB: 1950-10-25, 63 y.o.   MRN: [id number]  Cough Associated symptoms include postnasal drip. Pertinent negatives include no chest pain, chills, fever, sore throat, shortness of breath or wheezing.   Patient seen with cough off and on for several months. He describes a dry cough. No hay fever. No dyspnea. No hemoptysis. He has been on ACE inhibitor (Altace) for bowel 10 years now. He smoked only briefly during his college years. He does have frequent postnasal drip symptoms. No GERD symptoms. No appetite or weight changes. Cough is more of a nuisance. He has not taken any antihistamines or nasal steroids recently.  Patient had recent admission for atrial flutter with rapid ventricular response. He underwent TEE cardioversion on 8/24. He was started on Eliquis and metoprolol increased from 25-50 mg daily. He had chest x-ray portable which did not show any acute findings.  Past Medical History  Diagnosis Date  . Coronary artery disease     Severe three-vessel coronary artery disease with ejection fraction of 65%  . History of psoriasis   . History of gout   . SOB (shortness of breath)     Mild  . Hypercholesterolemia   . Arthritis   . Chicken pox   . GERD (gastroesophageal reflux disease)   . Hypertension    Past Surgical History  Procedure Laterality Date  . Tonsillectomy      Remote history of tonsillectomy.  . Cardiac catheterization  09/2003    Ejection fraction was estimated at 65%.  . Coronary artery bypass graft      Lima-lad,svg-diag,svg-om/distal LCX,svg-pda  . Tee without cardioversion N/A 03/15/2014    Procedure: TRANSESOPHAGEAL ECHOCARDIOGRAM (TEE);  Surgeon: Lelon Perla, MD;  Location: [facility] ENDOSCOPY;  Service: Cardiovascular;  Laterality: N/A;  . Cardioversion N/A 03/15/2014    Procedure: CARDIOVERSION;  Surgeon: Lelon Perla, MD;  Location: [facility] ENDOSCOPY;  Service: Cardiovascular;  Laterality: N/A;    reports  that he has quit smoking. He does not have any smokeless tobacco history on file. He reports that he drinks alcohol. He reports that he does not use illicit drugs. family history includes Cancer in his mother; Heart disease in his mother; Stroke in his mother. No Known Allergies    Review of Systems  Constitutional: Negative for fever, chills, appetite change, fatigue and unexpected weight change.  HENT: Positive for congestion and postnasal drip. Negative for sinus pressure, sore throat, trouble swallowing and voice change.   Respiratory: Positive for cough. Negative for shortness of breath and wheezing.   Cardiovascular: Negative for chest pain, palpitations and leg swelling.  Neurological: Negative for dizziness.       Objective:   Physical Exam  Constitutional: He appears well-developed and well-nourished.  HENT:  Right Ear: External ear normal.  Left Ear: External ear normal.  Mouth/Throat: Oropharynx is clear and moist.  Neck: Neck supple. No thyromegaly present.  Cardiovascular: Normal rate and regular rhythm.  Exam reveals no gallop.   Pulmonary/Chest: Effort normal and breath sounds normal. No respiratory distress. He has no wheezes. He has no rales.  Musculoskeletal: He exhibits no edema.  Lymphadenopathy:    He has no cervical adenopathy.          Assessment & Plan:  Chronic mild cough. Suspect probably related to ACE inhibitor use. He does have some postnasal drip symptoms suggestive of seasonal and perennial allergies. Try over-the-counter Claritin. Recommend over-the-counter Flonase or Nasacort.  He  will discuss with cardiologist possible change off ACE inhibitor if cough persist  Recent atrial flutter with rapid ventricular response. Clinically, in sinus rhythm today. Followup next 2 weeks with cardiology

## 2014-03-19 NOTE — Progress Notes (Signed)
Pre visit review using our clinic review tool, if applicable. No additional management support is needed unless otherwise documented below in the visit note. 

## 2014-03-24 ENCOUNTER — Encounter: Payer: Self-pay | Admitting: Internal Medicine

## 2014-03-24 ENCOUNTER — Ambulatory Visit (INDEPENDENT_AMBULATORY_CARE_PROVIDER_SITE_OTHER): Payer: Medicare Other | Admitting: Internal Medicine

## 2014-03-24 VITALS — BP 120/80 | HR 59 | Ht 73.0 in | Wt 208.1 lb

## 2014-03-24 DIAGNOSIS — I200 Unstable angina: Secondary | ICD-10-CM

## 2014-03-24 DIAGNOSIS — I4892 Unspecified atrial flutter: Secondary | ICD-10-CM | POA: Diagnosis not present

## 2014-03-24 DIAGNOSIS — Z951 Presence of aortocoronary bypass graft: Secondary | ICD-10-CM

## 2014-03-24 DIAGNOSIS — I483 Typical atrial flutter: Secondary | ICD-10-CM

## 2014-03-24 DIAGNOSIS — I10 Essential (primary) hypertension: Secondary | ICD-10-CM

## 2014-03-24 NOTE — Progress Notes (Signed)
Primary Care Physician: [name] Post, MD Referring Physician: Dr. Caryl [name] PCP: [name]   [name] [name] is a 63 y.o. male with a h/o CAD s/p bypass in 2005, with hospitalization 8/23 for aflutter with RVR,, with 2:1 conduction. Day of admission, he noted increased heart beat with shortness of breath. He underwent  TEE DCCV with return of sinus rhythm which he has maintained. No further chest pain or dyspnea. He is on NOAC since 8/23 and is here today for possible  aflutter ablation. Chadsvasc score is 2( AGE and VASC).  He was seen by Dr [name] [name] (his consult note is reviewed) and is referred to follow-up with me today.  Today, he denies symptoms of palpitations, chest pain, shortness of breath, orthopnea, PND, lower extremity edema, dizziness, presyncope, syncope, or neurologic sequela. The patient is tolerating medications without difficulties and is otherwise without complaint today.   Past Medical History  Diagnosis Date  . Coronary artery disease     Severe three-vessel coronary artery disease with ejection fraction of 65%  . History of psoriasis   . History of gout   . SOB (shortness of breath)     Mild  . Hypercholesterolemia   . Arthritis   . Chicken pox   . GERD (gastroesophageal reflux disease)   . Hypertension    Past Surgical History  Procedure Laterality Date  . Tonsillectomy      Remote history of tonsillectomy.  . Cardiac catheterization  09/2003    Ejection fraction was estimated at 65%.  . Coronary artery bypass graft      Lima-lad,svg-diag,svg-om/distal LCX,svg-pda  . Tee without cardioversion N/A 03/15/2014    Procedure: TRANSESOPHAGEAL ECHOCARDIOGRAM (TEE);  Surgeon: [name] Perla, MD;  Location: [facility] ENDOSCOPY;  Service: Cardiovascular;  Laterality: N/A;  . Cardioversion N/A 03/15/2014    Procedure: CARDIOVERSION;  Surgeon: [name] Perla, MD;  Location: [facility] ENDOSCOPY;  Service: Cardiovascular;  Laterality: N/A;    Current Outpatient Prescriptions    Medication Sig Dispense Refill  . apixaban (ELIQUIS) 5 MG TABS tablet Take 1 tablet (5 mg total) by mouth 2 (two) times daily.  60 tablet  11  . aspirin 81 MG EC tablet Take 1 tablet (81 mg total) by mouth daily.      Marland Kitchen atorvastatin (LIPITOR) 40 MG tablet Take 40 mg by mouth daily.      . cholecalciferol (VITAMIN D) 1000 UNITS tablet Take 1,000 Units by mouth daily.        . Coenzyme Q10 (CO Q 10 PO) Take 100 mg by mouth daily.       . colchicine 0.6 MG tablet Take 0.6 mg by mouth as needed (for gout).       . metoprolol succinate (TOPROL-XL) 50 MG 24 hr tablet Take 1 tablet (50 mg total) by mouth daily.  90 tablet  3  . multivitamin (THERAGRAN) per tablet Take 1 tablet by mouth daily.        . nitroGLYCERIN (NITROSTAT) 0.4 MG SL tablet Place 1 tablet (0.4 mg total) under the tongue every 5 (five) minutes as needed for chest pain.  25 tablet  3  . Omega-3 Fatty Acids (FISH OIL CONCENTRATE PO) Take 1,700 mg by mouth daily.        . ramipril (ALTACE) 2.5 MG capsule Take 2.5 mg by mouth daily.      . sildenafil (VIAGRA) 100 MG tablet Take 1 tablet (100 mg total) by mouth daily as needed for erectile dysfunction.  5 tablet  11   No current facility-administered medications for this visit.    No Known Allergies  History   Social History  . Marital Status: Married    Spouse Name: N/A    Number of Children: 1  . Years of Education: N/A   Occupational History  . financial     Social History Main Topics  . Smoking status: Former Research scientist (life sciences)  . Smokeless tobacco: Not on file  . Alcohol Use: Yes  . Drug Use: No  . Sexual Activity: Yes   Other Topics Concern  . Not on file   Social History Narrative  . No narrative on file    Family History  Problem Relation Age of Onset  . Heart disease Mother     cabg  . Cancer Mother     breast  . Stroke Mother     ROS- All systems are reviewed and negative except as per the HPI above  Physical Exam: Filed Vitals:   03/24/14 1215  BP:  120/80  Pulse: 59  Height: 6\' 1"  (1.854 m)  Weight: 94.403 kg (208 lb 1.9 oz)    GEN- The patient is well appearing, alert and oriented x 3 today.   Head- normocephalic, atraumatic Eyes-  Sclera clear, conjunctiva pink Ears- hearing intact Oropharynx- clear Neck- supple, no JVP Lymph- no cervical lymphadenopathy Lungs- Clear to ausculation bilaterally, normal work of breathing Heart- Regular rate and rhythm, no murmurs, rubs or gallops, PMI not laterally displaced GI- soft, NT, ND, + BS Extremities- no clubbing, cyanosis, or edema MS- no significant deformity or atrophy Skin- no rash or lesion Psych- euthymic mood, full affect Neuro- strength and sensation are intact  EKG- Sinus brady at 57 bpm. QT int 426 ms.  Echo- Low normal LV function; mild biatrial enlargement; mild MR, AI and TR; probable patent foramen ovale by color doppler. Patient subsequently underwent successful DCCV of atrial flutter.   Assessment and Plan:  1. Atrial flutter  Therapeutic strategies for aflutter including medicine and ablation were discussed in detail with the patient today. Risk, benefits, and alternatives to EP study and radiofrequency ablation were also discussed in detail today. These risks include but are not limited to stroke, bleeding, vascular damage, tamponade, perforation, damage to the heart and other structures, AV block requiring pacemaker, worsening renal function, and death. The patient understands these risk and wishes to proceed.  We will therefore proceed with catheter ablation at the next available time.    I have seen, examined the patient, and reviewed the above assessment and plan with [name] Palau NP.  Changes to above are made where necessary.  The patient has documented atria flutter.  Though not typical appearing by EKG, I suspect that his atrial flutter is isthmus dependant.  He is appropriately anticoagulated with eliquis.  He would like to proceed with ablation.  I will  therefore schedule atrial flutter ablation with [facility]o and Anesthesia for the next available time.  Co Sign: [name] Grayer, MD 03/24/2014 8:03 PM

## 2014-03-24 NOTE — Patient Instructions (Signed)
Your physician has recommended that you have an ablation. Catheter ablation is a medical procedure used to treat some cardiac arrhythmias (irregular heartbeats). During catheter ablation, a long, thin, flexible tube is put into a blood vessel in your groin (upper thigh), or neck. This tube is called an ablation catheter. It is then guided to your heart through the blood vessel. Radio frequency waves destroy small areas of heart tissue where abnormal heartbeats may cause an arrhythmia to start. Please see the instruction sheet given to you today. Janan Halter, RN will call you with schedule and instructions)

## 2014-03-25 ENCOUNTER — Telehealth: Payer: Self-pay | Admitting: Cardiology

## 2014-03-25 DIAGNOSIS — I483 Typical atrial flutter: Secondary | ICD-10-CM

## 2014-03-25 DIAGNOSIS — I10 Essential (primary) hypertension: Secondary | ICD-10-CM

## 2014-03-25 NOTE — Telephone Encounter (Signed)
Spoke to  Patient Informed patient  Dr Martinique, not in office today. It will be okay to have procedure , prior to appointment. Patient states when be being discharge from hospital - Dr Burt Knack mention having stress test. RN reviewed patient's discharge summary -  Stress test and follow up with doctor. Will defer Dr Martinique Whether to do stress test prior to ablation 03/30/14 and office appointment 04/02/14

## 2014-03-25 NOTE — Telephone Encounter (Signed)
RN informed patient of what Dr [name] comments. Patient is aware RN is still awaiting from Dr Martinique comment

## 2014-03-25 NOTE — Telephone Encounter (Signed)
ORDER ENTRY DONE FOR EXERCISE MYOVIEW POSSIBILITY TEST CAN BE DONE 03/26/14

## 2014-03-25 NOTE — Telephone Encounter (Signed)
Ok to go ahead and do stress test. It can be done before ablation.  Alroy Portela Martinique MD, [facility]

## 2014-03-25 NOTE — Telephone Encounter (Signed)
[name] [name] is calling because he was in the hospital for AFib and Dr. [name] is recommending that he do an Ablation and wanted know should he wait until he sees [name] to have the Ablation done .Marland Kitchen  Please call

## 2014-03-25 NOTE — Telephone Encounter (Signed)
Per dr [name], he questioned if pt could have a stress test tomorrow. Will call and discuss with the patient

## 2014-03-25 NOTE — Telephone Encounter (Signed)
Per Dr [name]  HE WAS AWARE PATIENT AN ORDER FOR STRESS TEST. STATES PATIENT COULD HAVE STRESS TEST AFTER ABLATION.

## 2014-03-26 ENCOUNTER — Telehealth: Payer: Self-pay | Admitting: *Deleted

## 2014-03-26 ENCOUNTER — Encounter (HOSPITAL_COMMUNITY): Payer: Self-pay | Admitting: Pharmacy Technician

## 2014-03-26 NOTE — Telephone Encounter (Signed)
Patient called back,  RN informed patient had left message there was opening this morning for MYOVIEW. But now not available. RN informed patient Dr Martinique was on board with ablation.  PATIENT states he will go had with ablation and schedule myoview and office appointment with Dr Martinique after results Patient is aware CHeryl will have to give him a call about reschedule appointment

## 2014-03-26 NOTE — Telephone Encounter (Signed)
Spoke with patient he is aware to be at the hospital at 8:30am 9/8  Check in at the [facility], NPO after midnight.  Aware to take his Eliquis the pm prior and no medications the morning of procedure.

## 2014-03-30 ENCOUNTER — Encounter (HOSPITAL_COMMUNITY)
Admission: RE | Disposition: A | Discharge status: Home / Self Care | Payer: Self-pay | Source: Ambulatory Visit | Attending: Internal Medicine

## 2014-03-30 ENCOUNTER — Encounter (HOSPITAL_COMMUNITY): Payer: Medicare Other | Admitting: Anesthesiology

## 2014-03-30 ENCOUNTER — Encounter (HOSPITAL_COMMUNITY): Payer: Self-pay | Admitting: Anesthesiology

## 2014-03-30 ENCOUNTER — Ambulatory Visit (HOSPITAL_COMMUNITY): Payer: Medicare Other | Admitting: Anesthesiology

## 2014-03-30 ENCOUNTER — Ambulatory Visit (HOSPITAL_COMMUNITY)
Admission: RE | Admit: 2014-03-30 | Discharge: 2014-03-31 | Disposition: A | Discharge status: Home / Self Care | Payer: Medicare Other | Source: Ambulatory Visit | Attending: Internal Medicine | Admitting: Internal Medicine

## 2014-03-30 DIAGNOSIS — M129 Arthropathy, unspecified: Secondary | ICD-10-CM | POA: Diagnosis not present

## 2014-03-30 DIAGNOSIS — E7800 Pure hypercholesterolemia, unspecified: Secondary | ICD-10-CM | POA: Diagnosis not present

## 2014-03-30 DIAGNOSIS — K219 Gastro-esophageal reflux disease without esophagitis: Secondary | ICD-10-CM | POA: Diagnosis not present

## 2014-03-30 DIAGNOSIS — Z951 Presence of aortocoronary bypass graft: Secondary | ICD-10-CM | POA: Diagnosis not present

## 2014-03-30 DIAGNOSIS — Z7982 Long term (current) use of aspirin: Secondary | ICD-10-CM | POA: Insufficient documentation

## 2014-03-30 DIAGNOSIS — I498 Other specified cardiac arrhythmias: Secondary | ICD-10-CM | POA: Diagnosis not present

## 2014-03-30 DIAGNOSIS — I2510 Atherosclerotic heart disease of native coronary artery without angina pectoris: Secondary | ICD-10-CM | POA: Diagnosis not present

## 2014-03-30 DIAGNOSIS — Z87891 Personal history of nicotine dependence: Secondary | ICD-10-CM | POA: Insufficient documentation

## 2014-03-30 DIAGNOSIS — Z23 Encounter for immunization: Secondary | ICD-10-CM | POA: Insufficient documentation

## 2014-03-30 DIAGNOSIS — I10 Essential (primary) hypertension: Secondary | ICD-10-CM | POA: Insufficient documentation

## 2014-03-30 DIAGNOSIS — I4892 Unspecified atrial flutter: Secondary | ICD-10-CM | POA: Diagnosis not present

## 2014-03-30 DIAGNOSIS — E785 Hyperlipidemia, unspecified: Secondary | ICD-10-CM | POA: Diagnosis not present

## 2014-03-30 DIAGNOSIS — I471 Supraventricular tachycardia, unspecified: Secondary | ICD-10-CM

## 2014-03-30 DIAGNOSIS — Z7901 Long term (current) use of anticoagulants: Secondary | ICD-10-CM | POA: Insufficient documentation

## 2014-03-30 DIAGNOSIS — I483 Typical atrial flutter: Secondary | ICD-10-CM

## 2014-03-30 HISTORY — DX: Arthropathic psoriasis, unspecified: L40.50

## 2014-03-30 HISTORY — PX: ATRIAL FLUTTER ABLATION: SHX5733

## 2014-03-30 LAB — BASIC METABOLIC PANEL
ANION GAP: 10 (ref 5–15)
BUN: 9 mg/dL (ref 6–23)
CO2: 27 mEq/L (ref 19–32)
Calcium: 9.4 mg/dL (ref 8.4–10.5)
Chloride: 104 mEq/L (ref 96–112)
Creatinine, Ser: 0.87 mg/dL (ref 0.50–1.35)
GFR calc Af Amer: >90 mL/min (ref >90)
GFR, EST NON AFRICAN AMERICAN: 89 mL/min — AB (ref >90)
Glucose, Bld: 101 mg/dL — ABNORMAL HIGH (ref 70–99)
Potassium: 4.5 mEq/L (ref 3.7–5.3)
Sodium: 141 mEq/L (ref 137–147)

## 2014-03-30 LAB — CBC
HEMATOCRIT: 41.3 % (ref 39.0–52.0)
Hemoglobin: 14.7 g/dL (ref 13.0–17.0)
MCH: 31.9 pg (ref 26.0–34.0)
MCHC: 35.6 g/dL (ref 30.0–36.0)
MCV: 89.6 fL (ref 78.0–100.0)
Platelets: 171 10*3/uL (ref 150–400)
RBC: 4.61 MIL/uL (ref 4.22–5.81)
RDW: 12.2 % (ref 11.5–15.5)
WBC: 5.2 10*3/uL (ref 4.0–10.5)

## 2014-03-30 SURGERY — ATRIAL FLUTTER ABLATION
Anesthesia: Monitor Anesthesia Care

## 2014-03-30 MED ORDER — SODIUM CHLORIDE 0.9 % IJ SOLN
3.0000 mL | Freq: Two times a day (BID) | INTRAMUSCULAR | Status: DC
  Administered 2014-03-30: 16:00:00 3 mL via INTRAVENOUS

## 2014-03-30 MED ORDER — OFF THE BEAT BOOK
Freq: Once | Status: AC
  Administered 2014-03-30: 21:00:00
  Filled 2014-03-30: qty 1

## 2014-03-30 MED ORDER — SODIUM CHLORIDE 0.9 % IV SOLN: 250.0000 mL | INTRAVENOUS | Status: DC | PRN

## 2014-03-30 MED ORDER — HYDROCODONE-ACETAMINOPHEN 5-325 MG PO TABS: 1.0000 | ORAL_TABLET | ORAL | Status: DC | PRN

## 2014-03-30 MED ORDER — HEPARIN SODIUM (PORCINE) 1000 UNIT/ML IJ SOLN
INTRAMUSCULAR | Status: AC
  Filled 2014-03-30: qty 1

## 2014-03-30 MED ORDER — ACETAMINOPHEN 325 MG PO TABS: 650.0000 mg | ORAL_TABLET | ORAL | Status: DC | PRN

## 2014-03-30 MED ORDER — SODIUM CHLORIDE 0.9 % IV SOLN: INTRAVENOUS | Status: DC

## 2014-03-30 MED ORDER — INFLUENZA VAC SPLIT QUAD 0.5 ML IM SUSY
0.5000 mL | PREFILLED_SYRINGE | INTRAMUSCULAR | Status: AC
Start: 2014-03-31 — End: 2014-03-31
  Administered 2014-03-31: 0.5 mL via INTRAMUSCULAR
  Filled 2014-03-30: qty 0.5

## 2014-03-30 MED ORDER — BUPIVACAINE HCL (PF) 0.25 % IJ SOLN
INTRAMUSCULAR | Status: AC
  Filled 2014-03-30: qty 30

## 2014-03-30 MED ORDER — FENTANYL CITRATE 0.05 MG/ML IJ SOLN
INTRAMUSCULAR | Status: DC | PRN
  Administered 2014-03-30: 25 ug via INTRAVENOUS
  Administered 2014-03-30: 50 ug via INTRAVENOUS
  Administered 2014-03-30: 25 ug via INTRAVENOUS
  Administered 2014-03-30: 50 ug via INTRAVENOUS

## 2014-03-30 MED ORDER — SODIUM CHLORIDE 0.9 % IJ SOLN: 3.0000 mL | INTRAMUSCULAR | Status: DC | PRN

## 2014-03-30 MED ORDER — LIDOCAINE HCL (CARDIAC) 20 MG/ML IV SOLN
INTRAVENOUS | Status: DC | PRN
  Administered 2014-03-30: 20 mg via INTRAVENOUS

## 2014-03-30 MED ORDER — ONDANSETRON HCL 4 MG/2ML IJ SOLN
INTRAMUSCULAR | Status: DC | PRN
  Administered 2014-03-30: 4 mg via INTRAVENOUS

## 2014-03-30 MED ORDER — ISOPROTERENOL HCL 0.2 MG/ML IJ SOLN
INTRAVENOUS | Status: AC
  Administered 2014-03-30: 30 mL/h via INTRAVENOUS
  Filled 2014-03-30: qty 125

## 2014-03-30 MED ORDER — SODIUM CHLORIDE 0.9 % IV SOLN
INTRAVENOUS | Status: DC | PRN
  Administered 2014-03-30: 11:00:00 via INTRAVENOUS

## 2014-03-30 MED ORDER — PROPOFOL 10 MG/ML IV BOLUS
INTRAVENOUS | Status: DC | PRN
  Administered 2014-03-30: 160 mg via INTRAVENOUS

## 2014-03-30 MED ORDER — ONDANSETRON HCL 4 MG/2ML IJ SOLN: 4.0000 mg | Freq: Four times a day (QID) | INTRAMUSCULAR | Status: DC | PRN

## 2014-03-30 MED ORDER — APIXABAN 5 MG PO TABS
5.0000 mg | ORAL_TABLET | Freq: Two times a day (BID) | ORAL | Status: DC
  Administered 2014-03-30 – 2014-03-31 (×2): 5 mg via ORAL
  Filled 2014-03-30 (×3): qty 1

## 2014-03-30 MED ORDER — MIDAZOLAM HCL 5 MG/5ML IJ SOLN
INTRAMUSCULAR | Status: DC | PRN
  Administered 2014-03-30: 2 mg via INTRAVENOUS

## 2014-03-30 NOTE — Anesthesia Procedure Notes (Signed)
Procedure Name: LMA Insertion Date/Time: 03/30/2014 11:52 AM Performed by: Rush Farmer E Pre-anesthesia Checklist: Patient identified, Emergency Drugs available, Suction available, Patient being monitored and Timeout performed Patient Re-evaluated:Patient Re-evaluated prior to inductionOxygen Delivery Method: Circle system utilized Preoxygenation: Pre-oxygenation with 100% oxygen Intubation Type: IV induction LMA: LMA inserted LMA Size: 4.0 Number of attempts: 1 Placement Confirmation: positive ETCO2 and breath sounds checked- equal and bilateral Tube secured with: Tape Dental Injury: Teeth and Oropharynx as per pre-operative assessment

## 2014-03-30 NOTE — Anesthesia Preprocedure Evaluation (Addendum)
Anesthesia Evaluation  Patient identified by MRN, date of birth, ID band Patient awake    Reviewed: Allergy & Precautions, H&P , NPO status , Patient's Chart, lab work & pertinent test results  Airway Mallampati: II      Dental  (+) Teeth Intact   Pulmonary former smoker,          Cardiovascular hypertension, Pt. on home beta blockers + CAD and + CABG + dysrhythmias Atrial Fibrillation     Neuro/Psych    GI/Hepatic GERD-  ,  Endo/Other    Renal/GU      Musculoskeletal  (+) Arthritis -,   Abdominal   Peds  Hematology   Anesthesia Other Findings   Reproductive/Obstetrics                          Anesthesia Physical Anesthesia Plan  ASA: III  Anesthesia Plan: MAC   Post-op Pain Management:    Induction: Intravenous  Airway Management Planned: LMA and Mask  Additional Equipment:   Intra-op Plan:   Post-operative Plan:   Informed Consent: I have reviewed the patients History and Physical, chart, labs and discussed the procedure including the risks, benefits and alternatives for the proposed anesthesia with the patient or authorized representative who has indicated his/her understanding and acceptance.     Plan Discussed with: CRNA, Anesthesiologist and Surgeon  Anesthesia Plan Comments:         Anesthesia Quick Evaluation

## 2014-03-30 NOTE — Transfer of Care (Signed)
Immediate Anesthesia Transfer of Care Note  Patient: [name] [name]  Procedure(s) Performed: Procedure(s): ATRIAL FLUTTER ABLATION (N/A)  Patient Location: Cath Lab  Anesthesia Type:General  Level of Consciousness: awake, alert  and oriented  Airway & Oxygen Therapy: Patient Spontanous Breathing  Post-op Assessment: Report given to PACU RN and Post -op Vital signs reviewed and stable  Post vital signs: Reviewed and stable  Complications: No apparent anesthesia complications

## 2014-03-30 NOTE — Anesthesia Postprocedure Evaluation (Signed)
  Anesthesia Post-op Note  Patient: [name] [name]  Procedure(s) Performed: Procedure(s): ATRIAL FLUTTER ABLATION (N/A)  Patient Location: PACU  Anesthesia Type:MAC  Level of Consciousness: awake, alert , oriented and patient cooperative  Airway and Oxygen Therapy: Patient Spontanous Breathing  Post-op Pain: none  Post-op Assessment: Post-op Vital signs reviewed, Patient's Cardiovascular Status Stable, Respiratory Function Stable, Patent Airway and No signs of Nausea or vomiting  Post-op Vital Signs: stable  Last Vitals:  Filed Vitals:   03/30/14 1355  BP: 130/55  Pulse:   Temp:   Resp:     Complications: No apparent anesthesia complications

## 2014-03-30 NOTE — Brief Op Note (Signed)
03/30/2014  1:31 PM  PATIENT:  [name] [name]  63 y.o. male  PRE-OPERATIVE DIAGNOSIS:  aflutter  POST-OPERATIVE DIAGNOSIS:  Atrial flutter, atrial tachycardia  PROCEDURE:  Procedure(s): ATRIAL FLUTTER ABLATION (N/A)  SURGEON:  Surgeon(s) and Role:    * Thompson Grayer, MD - Primary  PHYSICIAN ASSISTANT:   ASSISTANTS: none   ANESTHESIA:   IV sedation  EBL:     BLOOD ADMINISTERED:none  DRAINS: none   LOCAL MEDICATIONS USED:  LIDOCAINE   SPECIMEN:  No Specimen  DISPOSITION OF SPECIMEN:  N/A  COUNTS:  YES  TOURNIQUET:  * No tourniquets in log *  DICTATION: .Other Dictation: Dictation Number see dictation  PLAN OF CARE: Admit for overnight observation  PATIENT DISPOSITION:  PACU - hemodynamically stable.   Delay start of Pharmacological VTE agent (>24hrs) due to surgical blood loss or risk of bleeding: yes

## 2014-03-30 NOTE — Discharge Summary (Signed)
ELECTROPHYSIOLOGY PROCEDURE DISCHARGE SUMMARY    Patient ID: [name] [name],  MRN: [id number], DOB/AGE: [birth year] 63 y.o.  Admit date: 03/30/2014 Discharge date: 03/31/2014  Primary Care Physician: [name], MD Primary Cardiologist: Martinique Electrophysiologist: Upham  Primary Discharge Diagnosis:  Atrial flutter and atrial tachycardia status post ablation this admission  Secondary Discharge Diagnosis:  1.  CAD s/p CABG 2005 2.  Hyperlipidemia 3.  Arthritis 4.  Hypertension  No Known Allergies   Procedures This Admission:  1.  Electrophysiology study and radiofrequency catheter ablation of atrial flutter and atrial tachycardia this admission.  This study demonstrated typical atrial flutter that was successfully ablated along the cavotricuspid isthmus.  There was also an atrial tachycardia arising from the right atrial free wall that was successfully ablated.  There were no early apparent complications.   Brief HPI: [name] [name] is a 63 y.o. male with a past medical history as outlined above.  He was admitted in August of this year with 2:1 atrial flutter and underwent cardioversion after being initiated on Eliquis. He was referred to Dr [name] for consideration of catheter ablation.  Risks, benefits, and alternatives to ablation were reviewed with the patient who wished to proceed.   Hospital Course:  The patient was admitted and underwent EPS and ablation with details as outlined above.  He was monitored on telemetry overnight which demonstrated sinus rhythm.  His groin was without complication.  He was evaluated by Dr [name] who considered him stable for discharge to home.  He will be maintained on Eliquis for 2 more weeks.  He will followup with Dr [name] in 4 weeks.   Discharge Vitals: Blood pressure 145/68, pulse 64, temperature 98.6 F (37 C), temperature source Oral, resp. rate 18, height 6\' 1"  (1.854 m), weight 206 lb 9.1 oz (93.7 kg), SpO2 96.00%.  Physical  Exam: Filed Vitals:   03/30/14 2045 03/31/14 0031 03/31/14 0537 03/31/14 0757  BP: 115/53 127/97 123/60 145/68  Pulse: 57 58 66 64  Temp: 98.6 F (37 C) 98.2 F (36.8 C) 98.6 F (37 C) 98.6 F (37 C)  TempSrc: Oral Oral Oral Oral  Resp: 18 18 18 18   Height:      Weight:   206 lb 9.1 oz (93.7 kg)   SpO2: 97% 96% 96% 96%    GEN- The patient is well appearing, alert and oriented x 3 today.   Head- normocephalic, atraumatic Eyes-  Sclera clear, conjunctiva pink Ears- hearing intact Oropharynx- clear Neck- supple, Lungs- Clear to ausculation bilaterally, normal work of breathing Heart- Regular rate and rhythm, no murmurs, rubs or gallops, PMI not laterally displaced GI- soft, NT, ND, + BS Extremities- no clubbing, cyanosis, or edema, groin is without hematoma/ bruit he did have moderate ecchymosis of the R groin MS- no significant deformity or atrophy Skin- no rash or lesion Psych- euthymic mood, full affect Neuro- strength and sensation are intact   Labs:   Lab Results  Component Value Date   WBC 5.2 03/30/2014   HGB 14.7 03/30/2014   HCT 41.3 03/30/2014   MCV 89.6 03/30/2014   PLT 171 03/30/2014     Recent Labs Lab 03/30/14 0956  NA 141  K 4.5  CL 104  CO2 27  BUN 9  CREATININE 0.87  CALCIUM 9.4  GLUCOSE 101*    Discharge Medications:    Medication List         apixaban 5 MG Tabs tablet  Commonly known as:  ELIQUIS  Take 1 tablet (5 mg total) by mouth 2 (two) times daily.     aspirin 81 MG EC tablet  Take 1 tablet (81 mg total) by mouth daily.     atorvastatin 40 MG tablet  Commonly known as:  LIPITOR  Take 40 mg by mouth daily.     cholecalciferol 1000 UNITS tablet  Commonly known as:  VITAMIN D  Take 1,000 Units by mouth daily.     CO Q 10 PO  Take 100 mg by mouth daily.     colchicine 0.6 MG tablet  Take 0.6 mg by mouth as needed (for gout).     FISH OIL CONCENTRATE PO  Take 1,700 mg by mouth daily.     metoprolol succinate 25 MG 24 hr  tablet  Commonly known as:  TOPROL-XL  Take 1 tablet (25 mg total) by mouth daily.     multivitamin per tablet  Take 1 tablet by mouth daily.     nitroGLYCERIN 0.4 MG SL tablet  Commonly known as:  NITROSTAT  Place 1 tablet (0.4 mg total) under the tongue every 5 (five) minutes as needed for chest pain.     ramipril 2.5 MG capsule  Commonly known as:  ALTACE  Take 2.5 mg by mouth daily.     sildenafil 100 MG tablet  Commonly known as:  VIAGRA  Take 1 tablet (100 mg total) by mouth daily as needed for erectile dysfunction.        Disposition:   Follow-up Information   Follow up with [name], MD On 04/02/2014. (10:30 am)    Specialty:  Cardiology   Contact information:   [address] [place] [address] [phone]       Follow up with Thompson Grayer, MD On 05/03/2014. (10:45)    Specialty:  Cardiology   Contact information:   [address] [place] [address] [phone]       Duration of Discharge Encounter: Greater than 30 minutes including physician time.  Signed, Thompson Grayer MD

## 2014-03-30 NOTE — Progress Notes (Signed)
Patient arrived from cath holding approx. 1500.  Right groin cath site with 6 cm hematoma.  Margins marked. Pressure held from 1520 -1530.  Hematoma soft post hold.  Frequent checks throughout shift.  Pressure held again from 1650 - 1700.  Hematoma remains stable.  Dr. [name] and Benjaman Pott, RN saw patient approx. 1800.  Patient to stay overnight for observation, for DC in AM.

## 2014-03-30 NOTE — H&P (View-Only) (Signed)
Primary Care Physician: [name] Post, MD Referring Physician: Dr. Caryl [name] PCP: [name]   [name] [name] is a 63 y.o. male with a h/o CAD s/p bypass in 2005, with hospitalization 8/23 for aflutter with RVR,, with 2:1 conduction. Day of admission, he noted increased heart beat with shortness of breath. He underwent  TEE DCCV with return of sinus rhythm which he has maintained. No further chest pain or dyspnea. He is on NOAC since 8/23 and is here today for possible  aflutter ablation. Chadsvasc score is 2( AGE and VASC).  He was seen by Dr [name] [name] (his consult note is reviewed) and is referred to follow-up with me today.  Today, he denies symptoms of palpitations, chest pain, shortness of breath, orthopnea, PND, lower extremity edema, dizziness, presyncope, syncope, or neurologic sequela. The patient is tolerating medications without difficulties and is otherwise without complaint today.   Past Medical History  Diagnosis Date  . Coronary artery disease     Severe three-vessel coronary artery disease with ejection fraction of 65%  . History of psoriasis   . History of gout   . SOB (shortness of breath)     Mild  . Hypercholesterolemia   . Arthritis   . Chicken pox   . GERD (gastroesophageal reflux disease)   . Hypertension    Past Surgical History  Procedure Laterality Date  . Tonsillectomy      Remote history of tonsillectomy.  . Cardiac catheterization  09/2003    Ejection fraction was estimated at 65%.  . Coronary artery bypass graft      Lima-lad,svg-diag,svg-om/distal LCX,svg-pda  . Tee without cardioversion N/A 03/15/2014    Procedure: TRANSESOPHAGEAL ECHOCARDIOGRAM (TEE);  Surgeon: Lelon Perla, MD;  Location: [facility] ENDOSCOPY;  Service: Cardiovascular;  Laterality: N/A;  . Cardioversion N/A 03/15/2014    Procedure: CARDIOVERSION;  Surgeon: Lelon Perla, MD;  Location[facility] ENDOSCOPY;  Service: Cardiovascular;  Laterality: N/A;    Current Outpatient Prescriptions    Medication Sig Dispense Refill  . apixaban (ELIQUIS) 5 MG TABS tablet Take 1 tablet (5 mg total) by mouth 2 (two) times daily.  60 tablet  11  . aspirin 81 MG EC tablet Take 1 tablet (81 mg total) by mouth daily.      Marland Kitchen atorvastatin (LIPITOR) 40 MG tablet Take 40 mg by mouth daily.      . cholecalciferol (VITAMIN D) 1000 UNITS tablet Take 1,000 Units by mouth daily.        . Coenzyme Q10 (CO Q 10 PO) Take 100 mg by mouth daily.       . colchicine 0.6 MG tablet Take 0.6 mg by mouth as needed (for gout).       . metoprolol succinate (TOPROL-XL) 50 MG 24 hr tablet Take 1 tablet (50 mg total) by mouth daily.  90 tablet  3  . multivitamin (THERAGRAN) per tablet Take 1 tablet by mouth daily.        . nitroGLYCERIN (NITROSTAT) 0.4 MG SL tablet Place 1 tablet (0.4 mg total) under the tongue every 5 (five) minutes as needed for chest pain.  25 tablet  3  . Omega-3 Fatty Acids (FISH OIL CONCENTRATE PO) Take 1,700 mg by mouth daily.        . ramipril (ALTACE) 2.5 MG capsule Take 2.5 mg by mouth daily.      . sildenafil (VIAGRA) 100 MG tablet Take 1 tablet (100 mg total) by mouth daily as needed for erectile dysfunction.  5 tablet  11   No current facility-administered medications for this visit.    No Known Allergies  History   Social History  . Marital Status: Married    Spouse Name: N/A    Number of Children: 1  . Years of Education: N/A   Occupational History  . financial     Social History Main Topics  . Smoking status: Former Research scientist (life sciences)  . Smokeless tobacco: Not on file  . Alcohol Use: Yes  . Drug Use: No  . Sexual Activity: Yes   Other Topics Concern  . Not on file   Social History Narrative  . No narrative on file    Family History  Problem Relation Age of Onset  . Heart disease Mother     cabg  . Cancer Mother     breast  . Stroke Mother     ROS- All systems are reviewed and negative except as per the HPI above  Physical Exam: Filed Vitals:   03/24/14 1215  BP:  120/80  Pulse: 59  Height: 6\' 1"  (1.854 m)  Weight: 94.403 kg (208 lb 1.9 oz)    GEN- The patient is well appearing, alert and oriented x 3 today.   Head- normocephalic, atraumatic Eyes-  Sclera clear, conjunctiva pink Ears- hearing intact Oropharynx- clear Neck- supple, no JVP Lymph- no cervical lymphadenopathy Lungs- Clear to ausculation bilaterally, normal work of breathing Heart- Regular rate and rhythm, no murmurs, rubs or gallops, PMI not laterally displaced GI- soft, NT, ND, + BS Extremities- no clubbing, cyanosis, or edema MS- no significant deformity or atrophy Skin- no rash or lesion Psych- euthymic mood, full affect Neuro- strength and sensation are intact  EKG- Sinus brady at 57 bpm. QT int 426 ms.  Echo- Low normal LV function; mild biatrial enlargement; mild MR, AI and TR; probable patent foramen ovale by color doppler. Patient subsequently underwent successful DCCV of atrial flutter.   Assessment and Plan:  1. Atrial flutter  Therapeutic strategies for aflutter including medicine and ablation were discussed in detail with the patient today. Risk, benefits, and alternatives to EP study and radiofrequency ablation were also discussed in detail today. These risks include but are not limited to stroke, bleeding, vascular damage, tamponade, perforation, damage to the heart and other structures, AV block requiring pacemaker, worsening renal function, and death. The patient understands these risk and wishes to proceed.  We will therefore proceed with catheter ablation at the next available time.    I have seen, examined the patient, and reviewed the above assessment and plan with [name] NP.  Changes to above are made where necessary.  The patient has documented atria flutter.  Though not typical appearing by EKG, I suspect that his atrial flutter is isthmus dependant.  He is appropriately anticoagulated with eliquis.  He would like to proceed with ablation.  I will  therefore schedule atrial flutter ablation with Carto and Anesthesia for the next available time.  Co Sign: Thompson Grayer, MD 03/24/2014 8:03 PM

## 2014-03-30 NOTE — Progress Notes (Signed)
Site area: right groin Site Prior to Removal:  Level 0 Pressure Applied For:45 minutes Manual:   yes Patient Status During Pull:  stable Post Pull Site:  Level 1 Post Pull Instructions Given:  yes Post Pull Pulses Present: yes and remained present with sheath pull Dressing Applied:  Tegaderm Bedrest begins @ 7741 Comments: Dr. [name] aware of site status.

## 2014-03-30 NOTE — Progress Notes (Signed)
Dr. [name] notified of the following:  Continues in Sinus Rhythm with frequent PAC's.  Prolonged groin hold with <2cm hematoma noted proximal to stick site.

## 2014-03-30 NOTE — Interval H&P Note (Signed)
History and Physical Interval Note:  03/30/2014 11:10 AM  [name]  has presented today for surgery, with the diagnosis of aflutter  The various methods of treatment have been discussed with the patient and family. After consideration of risks, benefits and other options for treatment, the patient has consented to  Procedure(s): ATRIAL FLUTTER ABLATION (N/A) as a surgical intervention .  The patient's history has been reviewed, patient examined, no change in status, stable for surgery.  I have reviewed the patient's chart and labs.  Questions were answered to the patient's satisfaction.    Therapeutic strategies for atrial flutter including medicine and ablation were discussed in detail with the patient today. Risk, benefits, and alternatives to EP study and radiofrequency ablation were also discussed in detail today. These risks include but are not limited to stroke, bleeding, vascular damage, tamponade, perforation, damage to the heart and other structures, AV block requiring pacemaker, worsening renal function, and death. The patient understands these risk and wishes to proceed.   He did have mildly elevated cardiac markers during his atrial flutter with RVR in August.  He has no ischemic symptoms however.  He likely had "demand" ischemia in setting of Aflutter with RVR.   I think that it is reasonable to proceed with ablation prior to any further CV risk stratification.  He will follow-up with Dr Martinique as scheduled later this week. He reports compliance with eliquis twice daily without interruption.    [name] [name]

## 2014-03-31 ENCOUNTER — Other Ambulatory Visit: Payer: Self-pay | Admitting: Physician Assistant

## 2014-03-31 DIAGNOSIS — Z951 Presence of aortocoronary bypass graft: Secondary | ICD-10-CM | POA: Diagnosis not present

## 2014-03-31 DIAGNOSIS — I498 Other specified cardiac arrhythmias: Secondary | ICD-10-CM | POA: Diagnosis not present

## 2014-03-31 DIAGNOSIS — I4892 Unspecified atrial flutter: Secondary | ICD-10-CM | POA: Diagnosis not present

## 2014-03-31 DIAGNOSIS — I2510 Atherosclerotic heart disease of native coronary artery without angina pectoris: Secondary | ICD-10-CM | POA: Diagnosis not present

## 2014-03-31 MED ORDER — METOPROLOL SUCCINATE ER 25 MG PO TB24: 25.0000 mg | ORAL_TABLET | Freq: Every day | ORAL | Status: DC

## 2014-03-31 NOTE — Discharge Instructions (Signed)
No driving for 5 days. No lifting over 5 lbs for 1 week. No sexual activity for 1 week. You may return to work in 1 week. Keep procedure site clean & dry. If you notice increased pain, swelling, bleeding or pus, call/return!  You may shower, but no soaking baths/hot tubs/pools for 1 week.   Stop eliquis in 2 weeks

## 2014-03-31 NOTE — Op Note (Signed)
NAMEMarland Kitchen  KHYLE, GOODELL NO.:  [id number]  MEDICAL RECORD NO.:  [id number]  LOCATION:  6C08C                        FACILITY:  [facility]  PHYSICIAN:  Thompson Grayer, MD       DATE OF BIRTH:  [birth year]  DATE OF PROCEDURE: DATE OF DISCHARGE:                              OPERATIVE REPORT   SURGEON:  Thompson Grayer, MD  PREPROCEDURE DIAGNOSIS:  Supraventricular tachycardia.  POSTPROCEDURE DIAGNOSIS: 1. Counterclockwise isthmus-dependent right atrial flutter. 2. Ectopic atrial tachycardia arising from the free wall of the right     atrium.  PROCEDURES: 1. Comprehensive EP study. 2. Coronary sinus pacing and recording. 3. Mapping of supraventricular tachycardia. 4. Ablation of supraventricular tachycardia. 5. Arrhythmia induction with isoproterenol infused. 6. Cardioversion.  INTRODUCTION:  Mr. Odonohue is a pleasant 63 year old gentleman who recently presented with symptomatic atrial flutter.  His EKG was felt to not be completely consistent with typical atrial flutter.  He was cardioverted.  He now presents for EP study and radiofrequency ablation.  DESCRIPTION OF PROCEDURE:  Informed written consent was obtained and the patient was brought to the Electrophysiology Lab in the fasting state. He was adequately sedated with intravenous medications as outlined in the anesthesia report.  The patient's right groin was prepped and draped in usual sterile fashion by the EP lab staff.  Using a percutaneous Seldinger technique, one 6, one 7, and one 8-French hemostasis sheaths were placed in the right common femoral vein.  A 7-French Biosense Webster decapolar catheter was introduced through the right common femoral vein and advanced into the coronary sinus for recording and pacing from this location.  A 6-French quadripolar Josephson catheter was introduced through the right common femoral vein and advanced into the right ventricle for recording and pacing.  This catheter was  then pulled back to the His bundle location.  The patient presented to the Electrophysiology Lab in normal sinus rhythm.  His PR interval measured 179 milliseconds with a QRS duration of 115 milliseconds and a QT interval of 495 milliseconds.  His AH interval measured 95 milliseconds with an HV interval of 49 milliseconds.  Ventricular pacing was performed, which revealed VA dissociation at baseline.  Rapid atrial pacing was performed, which revealed an AV Wenckebach cycle length of 380 milliseconds.  With rapid atrial pacing down to a cycle length of 370 milliseconds.  The patient was observed to have atrial flutter.  The atrial flutter cycle length measured 320 milliseconds.  Proximal to distal coronary sinus activation was observed suggesting right atrial flutter.  Entrainment was performed from the left atrium, which revealed a long post pacing interval when compared to the tachycardia cycle length.  Pacing was then performed from the cavotricuspid isthmus which revealed a post pacing interval equal to the tachycardia cycle length. A 7-French dual decapolar Halo catheter was introduced through the right common femoral vein and advanced into the right atrium.  This catheter was positioned around the tricuspid valve anulus.  This demonstrated counter clockwise annular rotation around the tricuspid valve.  The patient was therefore felt to have counterclockwise isthmus-dependent right atrial flutter.  I, therefore elected to perform cavotricuspid isthmus ablation today.  A 7-French BlueLinx 8  mm ablation catheter was introduced through the right common femoral vein and advanced into the right atrium.  With catheter manipulation, the patient's atrial flutter terminated.  So, with catheter manipulation, the patient's atrial flutter degenerated into atrial fibrillation.  The patient was successfully cardioverted to sinus rhythm with a single synchronized 200 joule shock delivered  with cardioversion electrodes in the anterior-posterior thoracic configuration.  He remained in sinus rhythm thereafter.  Additional atrial pacing was performed, however atrial flutter was nonsustained.  I elected to perform cavotricuspid isthmus ablation today.  Mapping of the cavotricuspid isthmus revealed a rather standard isthmus.  A series of radiofrequency applications were delivered between the tricuspid valve anulus and the inferior vena cava along the usual cavotricuspid isthmus.  Each lesion was given with a target temperature of 60 degrees at 70 watts each.  A series of 3 radiofrequency applications were required to achieve complete bidirectional isthmus block.  Cavotricuspid isthmus block was achieved as evident by differential atrial pacing from the low lateral right atrium with a stimulus earliest to activation recorded bidirectional across the isthmus that measured 170 milliseconds.  The Halo catheter was repositioned along the tricuspid valve anulus and complete bidirectional isthmus block was again confirmed.  Following ablation, isoproterenol was infused at 2 mcg/minute with an adequate acceleration in heart rate.  In nursing home, the patient was observed to have ectopic junctional beat (proceeded by a local His electrogram) in a trigeminal fashion.  During isoproterenol infusion, ventricular pacing was performed, which confirms a VA dissociation.  Rapid atrial pacing was continued down to a cycle length of 250 milliseconds and SVT was observed.  This was initially a one-to-one tachycardia with a cycle length of 300 milliseconds.  Occasionally, 2:1 AV conduction was observed.  At times, a right bundle-branch aberrancy was also observed with ventricular conduction.  Ventricular pacing was performed during tachycardia, which revealed a VAAV response suggesting atrial tachycardia.  The Halo catheter was repositioned within the right atrium.  Mapping using the Halo catheter  was performed and this revealed that the tachycardia appeared to arise from the lateral wall of the right atrium along the 7 o'clock position.  The ablation catheter was therefore positioned in this location.  A series of radiofrequency applications were delivered with a target temperature of 60 degrees at 50 watts each.  A linear lesion was performed from the posterolateral portion of the right atrium and directed linearly across the free wall towards the tricuspid valve anulus.  The tachycardia slowed and then terminated during ablation.  Following ablation, isoproterenol was again infused at 2 mcg/minute.  Additional rapid atrial pacing was performed down to a cycle length of 220 milliseconds.  Tachycardia was no longer inducible.  After ablation, the AH interval measured 83 milliseconds with an HV interval of 47 milliseconds.  Following ablation, the AV Wenckebach cycle length was 350 milliseconds.  The patient was again confirmed to have complete bidirectional cavotricuspid isthmus block. The procedure was therefore considered completed.  All catheters were removed and the sheaths were aspirated and flushed.  The sheaths were removed and hemostasis was assured.  There were no early apparent complications.  CONCLUSIONS: 1. Sinus rhythm upon presentation. 2. No evidence of accessory pathways or dual AV nodal physiology. 3. Inducible counterclockwise isthmus-dependent right atrial flutter     successfully ablated along the usual cavotricuspid isthmus. 4. Ectopic atrial tachycardia arising from the free wall of the right     atrium, successfully ablated in this location. 5. No inducible arrhythmias  following ablation both on and off of     isoproterenol. 6. No early apparent complications.     Thompson Grayer, MD     JA/MEDQ  D:  03/30/2014  T:  03/30/2014  Job:  [id number]  cc:   Peter M. Martinique, M.D. Deboraha Sprang, MD, [facility]

## 2014-04-01 ENCOUNTER — Telehealth: Payer: Self-pay | Admitting: Physician Assistant

## 2014-04-01 ENCOUNTER — Telehealth: Payer: Self-pay | Admitting: Internal Medicine

## 2014-04-01 NOTE — Telephone Encounter (Signed)
New problem:   Pt called into report his hematoma has gotten bigger and was told to call when this happens.  Please give pt a call.

## 2014-04-01 NOTE — Telephone Encounter (Signed)
Patient called spoke to Dr.Jordan he advised ok to cancel appointment 04/02/14 since just discharged from hospital 04/01/14.Advised reschedule appointment after 04/21/14 myoview.Appointment rescheduled with Dr.Jordan 05/05/14 at 11:45 am.

## 2014-04-01 NOTE — Telephone Encounter (Signed)
    The patient paged the after hours provider about a hematoma in his groin s/p catheter ablation on 03/30/14. He is not in pain but the spot is tender. It appears to be growing larger and becoming harder. He called the office about this today but received no call back. He denies lightheadedness, weakness or syncope. He feels well otherwise. He just wanted to make sure he should not be alarmed. I advised him to place ice on the area and rest. He will call the office tomorrow if it worsens.    Angelena Form PA-C  [facility]

## 2014-04-02 ENCOUNTER — Encounter: Payer: Self-pay | Admitting: Physician Assistant

## 2014-04-02 ENCOUNTER — Ambulatory Visit: Payer: Medicare Other | Admitting: Cardiology

## 2014-04-02 ENCOUNTER — Ambulatory Visit (HOSPITAL_COMMUNITY)
Admission: RE | Admit: 2014-04-02 | Discharge: 2014-04-02 | Disposition: A | Discharge status: Home / Self Care | Payer: Medicare Other | Source: Ambulatory Visit | Attending: Cardiology | Admitting: Cardiology

## 2014-04-02 ENCOUNTER — Encounter: Payer: Self-pay | Admitting: Internal Medicine

## 2014-04-02 ENCOUNTER — Telehealth: Payer: Self-pay | Admitting: Internal Medicine

## 2014-04-02 DIAGNOSIS — M79609 Pain in unspecified limb: Secondary | ICD-10-CM

## 2014-04-02 DIAGNOSIS — R609 Edema, unspecified: Secondary | ICD-10-CM | POA: Insufficient documentation

## 2014-04-02 DIAGNOSIS — R52 Pain, unspecified: Secondary | ICD-10-CM

## 2014-04-02 NOTE — Telephone Encounter (Signed)
New message      Pt had to call the oncall doctor last night regarding his hematoma.  Please call

## 2014-04-02 NOTE — Progress Notes (Signed)
Patient ID: [name] [name], male   DOB: Aug 21, 1950, 63 y.o.   MRN: [id number] The patient was seen briefly to evaluate his right groin after having pseudoaneurysm Doppler. Doppler was negative she aneurysm. The patient's right groin a large area of ecchymosis and large hematoma. There is no pulsatile mass or bruit noted.  Was mildly tender.  I reassured the patient that this was absorbed slowly over time if he has worsening pain to call back.  Macaria Bias, PAC

## 2014-04-02 NOTE — Telephone Encounter (Signed)
This encounter was created in error - please disregard.

## 2014-04-02 NOTE — Telephone Encounter (Signed)
Spoke with patient yesterday afternoon and asked him to call me this morning if no better.  He called the on call to discuss his groin hematoma last night as was advised to ice and rest.  He is calling back this morning stating swelling is better but knot in groin is the size of an egg.    I have scheduled patient for a groin ultrasound and Byran Hager,PA will look at the site as

## 2014-04-02 NOTE — Progress Notes (Signed)
Right Lower Ext Limited Duplex Completed. Negative for pseudoaneurysm. There is a small hematoma in the right groin measuring 2.5x3.3x1.7 cm. Oda Cogan, BS, RDMS, RVT

## 2014-04-02 NOTE — Telephone Encounter (Signed)
Patient has questions regarding the ablation. Please call and advise.

## 2014-04-06 ENCOUNTER — Other Ambulatory Visit: Payer: Self-pay | Admitting: Internal Medicine

## 2014-04-06 NOTE — Telephone Encounter (Signed)
Returned call to patient.Patient is aware of Myoview scheduled 04/21/14 at 7:30 am.Follow up appointment scheduled with Dr.Jordan 05/05/14 at 11:45 am.

## 2014-04-21 ENCOUNTER — Ambulatory Visit (HOSPITAL_COMMUNITY)
Admission: RE | Admit: 2014-04-21 | Discharge: 2014-04-21 | Disposition: A | Discharge status: Home / Self Care | Payer: Medicare Other | Source: Ambulatory Visit | Attending: Cardiovascular Disease | Admitting: Cardiovascular Disease

## 2014-04-21 DIAGNOSIS — E669 Obesity, unspecified: Secondary | ICD-10-CM | POA: Insufficient documentation

## 2014-04-21 DIAGNOSIS — I10 Essential (primary) hypertension: Secondary | ICD-10-CM | POA: Diagnosis not present

## 2014-04-21 DIAGNOSIS — E785 Hyperlipidemia, unspecified: Secondary | ICD-10-CM | POA: Insufficient documentation

## 2014-04-21 DIAGNOSIS — I4892 Unspecified atrial flutter: Secondary | ICD-10-CM | POA: Diagnosis not present

## 2014-04-21 DIAGNOSIS — I483 Typical atrial flutter: Secondary | ICD-10-CM

## 2014-04-21 DIAGNOSIS — Z8249 Family history of ischemic heart disease and other diseases of the circulatory system: Secondary | ICD-10-CM | POA: Diagnosis not present

## 2014-04-21 DIAGNOSIS — R0602 Shortness of breath: Secondary | ICD-10-CM | POA: Insufficient documentation

## 2014-04-21 DIAGNOSIS — R002 Palpitations: Secondary | ICD-10-CM | POA: Insufficient documentation

## 2014-04-21 DIAGNOSIS — Z87891 Personal history of nicotine dependence: Secondary | ICD-10-CM | POA: Insufficient documentation

## 2014-04-21 MED ORDER — TECHNETIUM TC 99M SESTAMIBI GENERIC - CARDIOLITE
30.0000 | Freq: Once | INTRAVENOUS | Status: AC | PRN
  Administered 2014-04-21: 30 via INTRAVENOUS

## 2014-04-21 MED ORDER — TECHNETIUM TC 99M SESTAMIBI GENERIC - CARDIOLITE
10.0000 | Freq: Once | INTRAVENOUS | Status: AC | PRN
  Administered 2014-04-21: 10 via INTRAVENOUS

## 2014-04-21 NOTE — Procedures (Addendum)
[place] [address] [place] [address] [phone]  Cardiology Nuclear Med Study  [name] [name] is a 63 y.o. male     MRN : [id number]     DOB: April 18, 1951  Procedure Date: 04/21/2014  Nuclear Med Background Indication for Stress Test:  Evaluation for Ischemia, Graft Patency and [facility] History:  CAD;CABG X3-2005;TEE cardioversion-03/15/2014;Last NUC MPI on 01/25/2005-nonischemic;EF=60%. Cardiac Risk Factors: Family History - CAD, History of Smoking, Hypertension, Lipids, Obesity and Atrial Flutter  Symptoms:  Palpitations and SOB   Nuclear Pre-Procedure Caffeine/Decaff Intake:  7:00pm NPO After: 5:00am   IV Site: R Forearm  IV 0.9% NS with Angio Cath:  22g  Chest Size (in):  44"  IV Started by: Rolene Course, RN  Height: 6\' 1"  (1.854 m)  Cup Size: n/a  BMI:  Body mass index is 27.45 kg/(m^2). Weight:  208 lb (94.348 kg)   Tech Comments:  n/a    Nuclear Med Study 1 or 2 day study: 1 day  Stress Test Type:  Stress  Order Authorizing Provider:  [name], MD   Resting Radionuclide: Technetium 75m Sestamibi  Resting Radionuclide Dose: 10.1 mCi   Stress Radionuclide:  Technetium 67m Sestamibi  Stress Radionuclide Dose: 30.9 mCi           Stress Protocol Rest HR: 69 Stress HR: 150  Rest BP: 153/82 Stress BP: 210/86  Exercise Time (min): 10 METS: 11.7   Predicted Max HR: 155 bpm % Max HR: 96.77 bpm Rate Pressure Product: 31500  Dose of Adenosine (mg):  n/a Dose of Lexiscan:  n/a mg  Dose of Atropine (mg): n/a Dose of Dobutamine: n/a mcg/kg/min (at max HR)  Stress Test Technologist: Leane Para, CCT Nuclear Technologist: Otho Perl, CNMT   Rest Procedure:  Myocardial perfusion imaging was performed at rest 45 minutes following the intravenous administration of Technetium 44m Sestamibi. Stress Procedure:  The patient performed treadmill exercise using a Bruce  Protocol for 10 minutes . The  patient stopped due to SOB and denied any chest pain.  There were no significant ST-T wave changes.  Technetium 102m Sestamibi was injected IV at peak exercise and myocardial perfusion imaging was performed after a brief delay.  Transient Ischemic Dilatation (Normal <1.22):  1.13  LV Ejection Fraction: Study not gated     Rest ECG: NSR - Normal EKG  Stress ECG: No significant change from baseline ECG  QPS Raw Data Images:  Normal; no motion artifact; normal heart/lung ratio. Stress Images:  Normal homogeneous uptake in all areas of the myocardium. Rest Images:  Normal homogeneous uptake in all areas of the myocardium. Subtraction (SDS):  No evidence of ischemia.  Impression Exercise Capacity:  Good exercise capacity. BP Response:  Normal blood pressure response. Clinical Symptoms:  No significant symptoms noted. ECG Impression:  No significant ST segment change suggestive of ischemia. Comparison with Prior Nuclear Study: No significant change from previous study  Overall Impression:  Normal stress nuclear study.  LV Wall Motion:  Would not gate secondary to sinus arrythmia   Lorretta Harp, MD  04/21/2014 12:25 PM

## 2014-05-03 ENCOUNTER — Encounter: Payer: Medicare Other | Admitting: Internal Medicine

## 2014-05-05 ENCOUNTER — Ambulatory Visit (INDEPENDENT_AMBULATORY_CARE_PROVIDER_SITE_OTHER): Payer: Medicare Other | Admitting: Cardiology

## 2014-05-05 ENCOUNTER — Encounter: Payer: Self-pay | Admitting: Cardiology

## 2014-05-05 VITALS — BP 128/62 | HR 64 | Ht 73.0 in | Wt 208.3 lb

## 2014-05-05 DIAGNOSIS — E7800 Pure hypercholesterolemia, unspecified: Secondary | ICD-10-CM

## 2014-05-05 DIAGNOSIS — I483 Typical atrial flutter: Secondary | ICD-10-CM | POA: Diagnosis not present

## 2014-05-05 DIAGNOSIS — I200 Unstable angina: Secondary | ICD-10-CM | POA: Diagnosis not present

## 2014-05-05 DIAGNOSIS — I25810 Atherosclerosis of coronary artery bypass graft(s) without angina pectoris: Secondary | ICD-10-CM

## 2014-05-05 DIAGNOSIS — Z951 Presence of aortocoronary bypass graft: Secondary | ICD-10-CM | POA: Diagnosis not present

## 2014-05-05 DIAGNOSIS — I10 Essential (primary) hypertension: Secondary | ICD-10-CM

## 2014-05-05 NOTE — Progress Notes (Signed)
Trinna Balloon Molinelli Date of Birth: 1950-10-27 Medical Record #[id number]  History of Present Illness: [name] is seen today for  followup. He is status post CABG in 2005. He had a normal stress echo in May of 2013. In August he presented with atrial flutter with RVR. He had mildly elevated troponins. He had successful TEE guided DCCV. He then underwent ablation of atrial flutter and ectopic atrial tachycardia on 03/30/14 by Dr. [name].  Since then he denies any tachycardia. Pulse is a little irregular. No chest pain or SOB. Stress Myoview 2 weeks ago demonstrated excellent exercise tolerance and normal perfusion.   Current Outpatient Prescriptions on File Prior to Visit  Medication Sig Dispense Refill  . aspirin 81 MG EC tablet Take 1 tablet (81 mg total) by mouth daily.      Marland Kitchen atorvastatin (LIPITOR) 40 MG tablet Take 40 mg by mouth daily.      . cholecalciferol (VITAMIN D) 1000 UNITS tablet Take 1,000 Units by mouth daily.        . Coenzyme Q10 (CO Q 10 PO) Take 100 mg by mouth daily.       . colchicine 0.6 MG tablet Take 0.6 mg by mouth as needed (for gout).       . metoprolol succinate (TOPROL-XL) 25 MG 24 hr tablet Take 1 tablet (25 mg total) by mouth daily.  90 tablet  3  . multivitamin (THERAGRAN) per tablet Take 1 tablet by mouth daily.        . nitroGLYCERIN (NITROSTAT) 0.4 MG SL tablet Place 1 tablet (0.4 mg total) under the tongue every 5 (five) minutes as needed for chest pain.  25 tablet  3  . Omega-3 Fatty Acids (FISH OIL CONCENTRATE PO) Take 1,700 mg by mouth daily.        . ramipril (ALTACE) 2.5 MG capsule TAKE 1 CAPSULE DAILY.  30 capsule  1  . sildenafil (VIAGRA) 100 MG tablet Take 1 tablet (100 mg total) by mouth daily as needed for erectile dysfunction.  5 tablet  11   No current facility-administered medications on file prior to visit.    No Known Allergies  Past Medical History  Diagnosis Date  . Coronary artery disease     Severe three-vessel coronary artery disease  with ejection fraction of 65%  . History of psoriasis   . History of gout   . Hypercholesterolemia   . Chicken pox   . GERD (gastroesophageal reflux disease)   . Hypertension   . Psoriatic arthritis     Past Surgical History  Procedure Laterality Date  . Tee without cardioversion N/A 03/15/2014    Procedure: TRANSESOPHAGEAL ECHOCARDIOGRAM (TEE);  Surgeon: Lelon Perla, MD;  Location: [facility] ENDOSCOPY;  Service: Cardiovascular;  Laterality: N/A;  . Cardioversion N/A 03/15/2014    Procedure: CARDIOVERSION;  Surgeon: Lelon Perla, MD;  Location: [facility] ENDOSCOPY;  Service: Cardiovascular;  Laterality: N/A;  . Atrial flutter ablation  03/30/2014  . Tonsillectomy  1950's  . Cardiac catheterization  09/2003    Ejection fraction was estimated at 65%.  . Coronary artery bypass graft  09/2003    Lima-lad,svg-diag,svg-om/distal LCX,svg-pda    History  Smoking status  . Former Smoker -- 0.50 packs/day for 5 years  . Types: Cigarettes  Smokeless tobacco  . Never Used    Comment: "quit smoking cigarettes in the late 1970's"    History  Alcohol Use  . 2.4 oz/week  . 2 Cans of beer, 2 Shots of liquor  per week    Family History  Problem Relation Age of Onset  . Heart disease Mother     cabg  . Cancer Mother     breast  . Stroke Mother     Review of Systems: As noted in history of present illness.  All other systems were reviewed and are negative.  Physical Exam: BP 128/62  Pulse 64  Ht 6\' 1"  (1.854 m)  Wt 208 lb 4.8 oz (94.484 kg)  BMI 27.49 kg/m2 He is a well-developed white male in no acute distress. HEENT exam is unremarkable.  The carotids are 2+ without bruits.  There is no thyromegaly.  There is no JVD.  The lungs are clear.    The heart exam reveals an irregular rate with a normal S1 and S2.  There are no murmurs, gallops, or rubs.  The PMI is not displaced.   Abdominal exam reveals good bowel sounds.   There are no masses.  Exam of the legs reveal no clubbing, cyanosis, or  edema.  The legs are without rashes.  The distal pulses are intact.  Cranial nerves II - XII are intact.  Motor and sensory functions are intact.  The gait is normal.   LABORATORY DATA:  Lab Results  Component Value Date   WBC 5.2 03/30/2014   HGB 14.7 03/30/2014   HCT 41.3 03/30/2014   PLT 171 03/30/2014   GLUCOSE 101* 03/30/2014   CHOL 156 03/15/2014   TRIG 69 03/15/2014   HDL 79 03/15/2014   LDLCALC 63 03/15/2014   ALT 40 03/14/2014   AST 38* 03/14/2014   NA 141 03/30/2014   K 4.5 03/30/2014   CL 104 03/30/2014   CREATININE 0.87 03/30/2014   BUN 9 03/30/2014   CO2 27 03/30/2014   TSH 1.400 03/14/2014   PSA 1.22 08/11/2013   INR 1.08 06/12/2009  Cardiology Nuclear Med Study  JAMERIUS BOECKMAN is a 63 y.o. male MRN : [id number] DOB: 1950-08-03  Procedure Date: 04/21/2014  Nuclear Med Background  Indication for Stress Test: Evaluation for Ischemia, Graft Patency and [facility]  History: CAD;CABG X3-2005;TEE cardioversion-03/15/2014;Last NUC MPI on 01/25/2005-nonischemic;EF=60%.  Cardiac Risk Factors: Family History - CAD, History of Smoking, Hypertension, Lipids, Obesity and Atrial Flutter  Symptoms: Palpitations and SOB  Nuclear Pre-Procedure  Caffeine/Decaff Intake: 7:00pm  NPO After: 5:00am   IV Site: R Forearm  IV 0.9% NS with Angio Cath: 22g   Chest Size (in): 44"  IV Started by: Rolene Course, RN   Height: 6\' 1"  (1.854 m)  Cup Size: n/a   BMI: Body mass index is 27.45 kg/(m^2).  Weight: 208 lb (94.348 kg)    Tech Comments: n/a   Nuclear Med Study  1 or 2 day study: 1 day  Stress Test Type: Stress   Order Authorizing Provider: Bronda Alfred Martinique, MD    Resting Radionuclide: Technetium 33m Sestamibi  Resting Radionuclide Dose: 10.1 mCi   Stress Radionuclide: Technetium 26m Sestamibi  Stress Radionuclide Dose: 30.9 mCi   Stress Protocol  Rest HR: 69  Stress HR: 150   Rest BP: 153/82  Stress BP: 210/86   Exercise Time (min): 10  METS: 11.7   Predicted Max HR: 155 bpm  % Max HR: 96.77 bpm  Rate  Pressure Product: 31500  Dose of Adenosine (mg): n/a  Dose of Lexiscan: n/a mg   Dose of Atropine (mg): n/a  Dose of Dobutamine: n/a mcg/kg/min (at max HR)   Stress Test Technologist: Leane Para, CCT  Nuclear Technologist: Otho Perl, CNMT   Rest Procedure: Myocardial perfusion imaging was performed at rest 45 minutes following the intravenous administration of Technetium 39m Sestamibi.  Stress Procedure: The patient performed treadmill exercise using a Bruce Protocol for 10 minutes . The patient stopped due to SOB and denied any chest pain. There were no significant ST-T wave changes. Technetium 80m Sestamibi was injected IV at peak exercise and myocardial perfusion imaging was performed after a brief delay.  Transient Ischemic Dilatation (Normal <1.22): 1.13  LV Ejection Fraction: Study not gated  Rest ECG: NSR - Normal EKG  Stress ECG: No significant change from baseline ECG  QPS  Raw Data Images: Normal; no motion artifact; normal heart/lung ratio.  Stress Images: Normal homogeneous uptake in all areas of the myocardium.  Rest Images: Normal homogeneous uptake in all areas of the myocardium.  Subtraction (SDS): No evidence of ischemia.  Impression  Exercise Capacity: Good exercise capacity.  BP Response: Normal blood pressure response.  Clinical Symptoms: No significant symptoms noted.  ECG Impression: No significant ST segment change suggestive of ischemia.  Comparison with Prior Nuclear Study: No significant change from previous study  Overall Impression: Normal stress nuclear study.  LV Wall Motion: Would not gate secondary to sinus arrythmia  Lorretta Harp, MD  04/21/2014 12:25 PM      Assessment / Plan: 1. Coronary disease status post CABG in 2005. He is asymptomatic. He had a normal Myoview study 2 weeks ago. We will continue risk factor modification. Continue metoprolol and aspirin therapy.  2. Hyperlipidemia. Excellent control on current therapy.  3.  Hypertension-well controlled.  4. Atrial flutter s/p ablation. Maintaining NSR. Follow up with Dr. [name] on 05/21/14.  I will follow up in 6 months.

## 2014-05-05 NOTE — Patient Instructions (Signed)
Continue your current therapy  I will see you in 6 months.   

## 2014-05-18 ENCOUNTER — Telehealth: Payer: Self-pay | Admitting: Family Medicine

## 2014-05-18 NOTE — Telephone Encounter (Signed)
Pt called to ask for a referral to see physical therapist.   Reason; pt said he has had a few falls,stiffness in knees and legs. He looking to see if they have some exercises he can do

## 2014-05-19 ENCOUNTER — Other Ambulatory Visit: Payer: Self-pay | Admitting: Family Medicine

## 2014-05-19 DIAGNOSIS — M25661 Stiffness of right knee, not elsewhere classified: Secondary | ICD-10-CM

## 2014-05-19 DIAGNOSIS — M25662 Stiffness of left knee, not elsewhere classified: Principal | ICD-10-CM

## 2014-05-19 NOTE — Telephone Encounter (Signed)
Referral is ordered

## 2014-05-19 NOTE — Telephone Encounter (Signed)
Okay to setup

## 2014-05-21 ENCOUNTER — Ambulatory Visit (INDEPENDENT_AMBULATORY_CARE_PROVIDER_SITE_OTHER): Payer: Medicare Other | Admitting: Internal Medicine

## 2014-05-21 ENCOUNTER — Encounter: Payer: Self-pay | Admitting: Internal Medicine

## 2014-05-21 VITALS — BP 136/82 | HR 72 | Ht 73.0 in | Wt 210.1 lb

## 2014-05-21 DIAGNOSIS — I200 Unstable angina: Secondary | ICD-10-CM

## 2014-05-21 DIAGNOSIS — G4710 Hypersomnia, unspecified: Secondary | ICD-10-CM

## 2014-05-21 DIAGNOSIS — I483 Typical atrial flutter: Secondary | ICD-10-CM

## 2014-05-21 DIAGNOSIS — R400 Somnolence: Secondary | ICD-10-CM

## 2014-05-21 DIAGNOSIS — R0683 Snoring: Secondary | ICD-10-CM

## 2014-05-21 NOTE — Patient Instructions (Addendum)
Your physician wants you to follow-up in: 6 months with Dr Martinique You will receive a reminder letter in the mail two months in advance. If you don't receive a letter, please call our office to schedule the follow-up appointment.  Your physician has recommended that you have a sleep study. This test records several body functions during sleep, including: brain activity, eye movement, oxygen and carbon dioxide blood levels, heart rate and rhythm, breathing rate and rhythm, the flow of air through your mouth and nose, snoring, body muscle movements, and chest and belly movement.

## 2014-05-22 DIAGNOSIS — R0683 Snoring: Secondary | ICD-10-CM | POA: Insufficient documentation

## 2014-05-22 NOTE — Progress Notes (Signed)
PCP: [name], MD Primary Cardiologist:  Dr Martinique  [name] [name] is a 63 y.o. male who presents today for routine electrophysiology followup.  Since his recent atach/ atrial flutter ablation, the patient reports doing very well. He has had no further arrhythmia.  He denies procedure related complications. Today, he denies symptoms of palpitations, chest pain, shortness of breath,  lower extremity edema, dizziness, presyncope, or syncope.  The patient is otherwise without complaint today.   Past Medical History  Diagnosis Date  . Coronary artery disease     Severe three-vessel coronary artery disease with ejection fraction of 65%  . History of psoriasis   . History of gout   . Hypercholesterolemia   . Chicken pox   . GERD (gastroesophageal reflux disease)   . Hypertension   . Psoriatic arthritis    Past Surgical History  Procedure Laterality Date  . Tee without cardioversion N/A 03/15/2014    Procedure: TRANSESOPHAGEAL ECHOCARDIOGRAM (TEE);  Surgeon: Lelon Perla, MD;  Location: [facility] ENDOSCOPY;  Service: Cardiovascular;  Laterality: N/A;  . Cardioversion N/A 03/15/2014    Procedure: CARDIOVERSION;  Surgeon: Lelon Perla, MD;  Location: [facility] ENDOSCOPY;  Service: Cardiovascular;  Laterality: N/A;  . Atrial flutter ablation  03/30/2014  . Tonsillectomy  1950's  . Cardiac catheterization  09/2003    Ejection fraction was estimated at 65%.  . Coronary artery bypass graft  09/2003    Lima-lad,svg-diag,svg-om/distal LCX,svg-pda    ROS- all systems are reviewed and negatives except as per HPI above  Current Outpatient Prescriptions  Medication Sig Dispense Refill  . aspirin 81 MG EC tablet Take 1 tablet (81 mg total) by mouth daily.      Marland Kitchen atorvastatin (LIPITOR) 40 MG tablet Take 40 mg by mouth daily.      . cholecalciferol (VITAMIN D) 1000 UNITS tablet Take 1,000 Units by mouth daily.        . Coenzyme Q10 (CO Q 10 PO) Take 100 mg by mouth daily.       . colchicine 0.6 MG  tablet Take 0.6 mg by mouth daily as needed (for gout).       . metoprolol succinate (TOPROL-XL) 25 MG 24 hr tablet Take 1 tablet (25 mg total) by mouth daily.  90 tablet  3  . multivitamin (THERAGRAN) per tablet Take 1 tablet by mouth daily.        . nitroGLYCERIN (NITROSTAT) 0.4 MG SL tablet Place 0.4 mg under the tongue every 5 (five) minutes as needed for chest pain (MAX 3 TABLETS).      . NON FORMULARY Borage Oil as directed      . Omega-3 Fatty Acids (FISH OIL CONCENTRATE PO) Take 1,700 mg by mouth daily.        . ramipril (ALTACE) 2.5 MG capsule TAKE 1 CAPSULE BY MOUTH DAILY.      . sildenafil (VIAGRA) 100 MG tablet Take 1 tablet (100 mg total) by mouth daily as needed for erectile dysfunction.  5 tablet  11   No current facility-administered medications for this visit.    Physical Exam: Filed Vitals:   05/21/14 1609  BP: 136/82  Pulse: 72  Height: 6\' 1"  (1.854 m)  Weight: 210 lb 1.9 oz (95.31 kg)    GEN- The patient is well appearing, alert and oriented x 3 today.   Head- normocephalic, atraumatic Eyes-  Sclera clear, conjunctiva pink Ears- hearing intact Oropharynx- clear Lungs- Clear to ausculation bilaterally, normal work of breathing Heart- Regular rate  and rhythm, no murmurs, rubs or gallops, PMI not laterally displaced GI- soft, NT, ND, + BS Extremities- no clubbing, cyanosis, or edema  ekg today reveals sinus rhythm with PACs  Assessment and Plan:  1. Atrial flutter/ atach Doing well post ablation without recurrence No further EP workup planned at this time.  2. Snoring/ fatigue Given prior atrial arrhythmias and symptoms suggestive of sleep apnea, will order a sleep study  He will follow-up with Dr Martinique and I will see as needed going forward.

## 2014-05-24 ENCOUNTER — Ambulatory Visit: Payer: Medicare Other | Attending: Family Medicine

## 2014-05-24 DIAGNOSIS — M25662 Stiffness of left knee, not elsewhere classified: Secondary | ICD-10-CM | POA: Insufficient documentation

## 2014-05-24 DIAGNOSIS — M109 Gout, unspecified: Secondary | ICD-10-CM | POA: Insufficient documentation

## 2014-05-24 DIAGNOSIS — I10 Essential (primary) hypertension: Secondary | ICD-10-CM | POA: Insufficient documentation

## 2014-05-24 DIAGNOSIS — R262 Difficulty in walking, not elsewhere classified: Secondary | ICD-10-CM | POA: Diagnosis not present

## 2014-05-24 DIAGNOSIS — Z5189 Encounter for other specified aftercare: Secondary | ICD-10-CM | POA: Insufficient documentation

## 2014-05-24 DIAGNOSIS — Z951 Presence of aortocoronary bypass graft: Secondary | ICD-10-CM | POA: Diagnosis not present

## 2014-05-24 DIAGNOSIS — M25661 Stiffness of right knee, not elsewhere classified: Secondary | ICD-10-CM | POA: Diagnosis not present

## 2014-05-31 ENCOUNTER — Ambulatory Visit: Payer: Medicare Other

## 2014-05-31 DIAGNOSIS — Z5189 Encounter for other specified aftercare: Secondary | ICD-10-CM | POA: Diagnosis not present

## 2014-06-14 ENCOUNTER — Ambulatory Visit: Payer: Medicare Other

## 2014-06-14 DIAGNOSIS — Z5189 Encounter for other specified aftercare: Secondary | ICD-10-CM | POA: Diagnosis not present

## 2014-06-21 ENCOUNTER — Ambulatory Visit: Payer: Medicare Other

## 2014-06-21 DIAGNOSIS — Z5189 Encounter for other specified aftercare: Secondary | ICD-10-CM | POA: Diagnosis not present

## 2014-07-01 ENCOUNTER — Encounter (HOSPITAL_COMMUNITY): Payer: Self-pay | Admitting: Internal Medicine

## 2014-08-05 ENCOUNTER — Encounter (HOSPITAL_COMMUNITY): Payer: Self-pay | Admitting: Cardiology

## 2014-08-05 ENCOUNTER — Ambulatory Visit (HOSPITAL_BASED_OUTPATIENT_CLINIC_OR_DEPARTMENT_OTHER): Payer: PPO | Attending: Internal Medicine

## 2014-08-05 DIAGNOSIS — G4733 Obstructive sleep apnea (adult) (pediatric): Secondary | ICD-10-CM | POA: Insufficient documentation

## 2014-08-05 DIAGNOSIS — G4710 Hypersomnia, unspecified: Secondary | ICD-10-CM | POA: Diagnosis not present

## 2014-08-05 DIAGNOSIS — R0609 Other forms of dyspnea: Secondary | ICD-10-CM

## 2014-08-05 DIAGNOSIS — R0683 Snoring: Secondary | ICD-10-CM | POA: Diagnosis present

## 2014-08-05 DIAGNOSIS — R400 Somnolence: Secondary | ICD-10-CM

## 2014-08-05 DIAGNOSIS — R0989 Other specified symptoms and signs involving the circulatory and respiratory systems: Secondary | ICD-10-CM

## 2014-08-06 ENCOUNTER — Telehealth: Payer: Self-pay | Admitting: Cardiology

## 2014-08-06 DIAGNOSIS — G4733 Obstructive sleep apnea (adult) (pediatric): Secondary | ICD-10-CM | POA: Insufficient documentation

## 2014-08-06 NOTE — Addendum Note (Signed)
Addended by: Sueanne Margarita on: 08/06/2014 09:41 PM   Modules accepted: Orders

## 2014-08-06 NOTE — Sleep Study (Addendum)
   NAME: [name] [name] DATE OF BIRTH:  [birth year] MEDICAL RECORD NUMBER [id number]  LOCATION: [facility]  PHYSICIAN: Flavia Bruss R  DATE OF STUDY: 08/05/2014  SLEEP STUDY TYPE: Nocturnal Polysomnogram with CPAP titraion               REFERRING PHYSICIAN: Thompson Grayer, MD  INDICATION FOR STUDY: Excessive daytime sleepiness and snoring  EPWORTH SLEEPINESS SCORE: 2 HEIGHT: 6'1" WEIGHT: 205lbs    NECK SIZE: 17 in.  MEDICATIONS: Reviewed in the chart  SLEEP ARCHITECTURE: During the diagnostic portion the patient slept for a total of 127 minutes with no slow wave or REM sleep.  The onset to sleep latency was 23 minutes and sleep efficiency was reduced at 69%.  During the CPAP titration the patient slept for a total of 134 minutes with no slow wave sleep and 42 minutes of REM sleep.  The onset to sleep latency was 0 and the onset to REM sleep latency 113 minutes.  The sleep efficiency was 63%.    RESPIRATORY DATA: The patient had a total of 21 apneas , all of which were obstructive apneas.  There were 15 hypopneas.  Most events occurred in the nonsupine position.  The overall AHI was 16.9 events per hour consistent with moderate obstructive sleep apnea/hypopnea syndrome.  There was moderate snoring.  The patient was started on CPAP at 4cm H20 and titrated for respiratory events and snoring to 5cm H20.  The AHI was 0 at 5cm H20.  The patient was able to achieve REM sleep and maintain for a prolonged period of time without any further respiratory events but unable to maintain the supine position.    OXYGEN DATA: The lowest O2 saturation during the diagnostic study was 87% and during CPAP titration was 88%.  There were no O2 desaturations <88% during the CPAP titration.    CARDIAC DATA: The patient maintained NSR with PVC's and 2 episodes of nonsustained atrial tachycardia up to 14 beats.  MOVEMENT/PARASOMNIA: There were increased periodic limbe movements during the  diagnostic portion with an index of 72 movements per hour which resolved with CPAP titration.  There were no REM sleep behavior disorders.  IMPRESSION/ RECOMMENDATION:   1.  Moderate Obstructive Sleep Apnea/Hypopnea syndrome with an AHI of 16.9 events per hour.  Most events occurred in the nonsupine position and all occurred during NREM sleep.   2.  Moderate snoring was noted during the diagnostic study. 3.  Reduced sleep efficiency with increased frequency of arousals due to respiratory events.  4.  Reduced REM and slow wave sleep. 5.  Significant periodic limb movements during diagnostic study which resolved with CPAP therapy. 6.  Occasional PVC's with 2 episodes of nonsustained atrial tachycardia up to 14 beats were noted during sleep. 7.  Recommend ResMed CPAP with heated humidifier, C flex of 3 and medium FIsher&Paykel Eson Nasal mask with chin strap set at 5cm H20.  Signed: Sueanne Margarita Diplomate, American Board of Sleep Medicine  ELECTRONICALLY SIGNED ON:  08/06/2014, 9:16 PM [place] PH: [phone]   FX: [phone] [place]

## 2014-08-06 NOTE — Addendum Note (Signed)
Addended by: Sueanne Margarita on: 08/06/2014 09:33 PM   Modules accepted: Orders

## 2014-08-06 NOTE — Telephone Encounter (Signed)
Please let patient know that he has moderate OSA and had successful CPAP titration and will be set up with CPAP.  Please let [facility] know that order for CPAP is in EPIC>  Please set up patient to see me in 10 weeks

## 2014-08-10 ENCOUNTER — Encounter: Payer: Self-pay | Admitting: Internal Medicine

## 2014-08-10 NOTE — Telephone Encounter (Signed)
Patient informed of CPAP titration results and verbal understanding expressed.  F/U OV scheduled for April 6. [place] notified orders are in EPIC.

## 2014-08-10 NOTE — Telephone Encounter (Signed)
New Msg          Pt returning call from today.    Please return call.

## 2014-08-10 NOTE — Telephone Encounter (Signed)
Left message to call back  

## 2014-08-10 NOTE — Telephone Encounter (Signed)
This encounter was created in error - please disregard.

## 2014-09-10 ENCOUNTER — Telehealth: Payer: Self-pay | Admitting: Internal Medicine

## 2014-09-10 NOTE — Telephone Encounter (Signed)
New message     Patient calling wants to know can he take mucinex  D for a cold.

## 2014-09-10 NOTE — Telephone Encounter (Signed)
Pt made aware okay to take Mucinex but not the Mucinex D.  Pt also advised that Coricidin medications are okay to take that should not affect blood pressures.  Pt agreed and no other questions at this time.

## 2014-09-29 ENCOUNTER — Ambulatory Visit (INDEPENDENT_AMBULATORY_CARE_PROVIDER_SITE_OTHER): Payer: PPO | Admitting: Cardiology

## 2014-09-29 ENCOUNTER — Encounter: Payer: Self-pay | Admitting: Cardiology

## 2014-09-29 VITALS — BP 140/70 | HR 64 | Ht 73.0 in | Wt 211.8 lb

## 2014-09-29 DIAGNOSIS — I25810 Atherosclerosis of coronary artery bypass graft(s) without angina pectoris: Secondary | ICD-10-CM

## 2014-09-29 DIAGNOSIS — E7800 Pure hypercholesterolemia, unspecified: Secondary | ICD-10-CM

## 2014-09-29 DIAGNOSIS — Z951 Presence of aortocoronary bypass graft: Secondary | ICD-10-CM

## 2014-09-29 DIAGNOSIS — I483 Typical atrial flutter: Secondary | ICD-10-CM

## 2014-09-29 NOTE — Patient Instructions (Signed)
Continue your current therapy  I will see you in 6 months.   

## 2014-09-29 NOTE — Progress Notes (Signed)
Cardiology Office Note   Date:  09/29/2014   ID:  [name], [name] [birth year], MRN [id number]  PCP:  [name] Post, MD  Cardiologist:   [name] Kawecki Martinique, MD   Chief Complaint  Patient presents with  . Coronary Artery Disease    no complaints.  . Atrial Flutter      History of Present Illness: [name] [name] is a 64 y.o. male who presents for follow up of atrial flutter and CAD. He is status [name] CABG in 2005. He had a normal stress echo in May of 2013. In August 2015 he presented with atrial flutter with RVR. He had mildly elevated troponins. He had successful TEE guided DCCV. He then underwent ablation of atrial flutter and ectopic atrial tachycardia on 03/30/14 by Dr. Rayann [name]. Since then he denies any tachycardia.  No chest pain or SOB. Stress Myoview September 2015 demonstrated excellent exercise tolerance and normal perfusion. TEE showed normal LV function, mild biatrial enlargement, mild MR,TR,AI.  Today he reports he is doing well. No chest pain or SOB. He has been diagnosed with OSA by split night sleep study. He is scheduled to see Dr. Radford [name] for management. He is now on CPAP.      Past Medical History  Diagnosis Date  . Coronary artery disease     Severe three-vessel coronary artery disease with ejection fraction of 65%  . History of psoriasis   . History of gout   . Hypercholesterolemia   . Chicken pox   . GERD (gastroesophageal reflux disease)   . Hypertension   . Psoriatic arthritis     Past Surgical History  Procedure Laterality Date  . Tee without cardioversion N/A 03/15/2014    Procedure: TRANSESOPHAGEAL ECHOCARDIOGRAM (TEE);  Surgeon: [name] Perla, MD;  Location: [facility] ENDOSCOPY;  Service: Cardiovascular;  Laterality: N/A;  . Cardioversion N/A 03/15/2014    Procedure: CARDIOVERSION;  Surgeon: [name] Perla, MD;  Location: [facility] ENDOSCOPY;  Service: Cardiovascular;  Laterality: N/A;  . Atrial flutter ablation  03/30/2014  . Tonsillectomy  1950's  .  Cardiac catheterization  09/2003    Ejection fraction was estimated at 65%.  . Coronary artery bypass graft  09/2003    Lima-lad,svg-diag,svg-om/distal LCX,svg-pda  . Atrial flutter ablation N/A 03/30/2014    Procedure: ATRIAL FLUTTER ABLATION;  Surgeon: [name] Mark, MD;  Location: [place] CATH LAB;  Service: Cardiovascular;  Laterality: N/A;     Current Outpatient Prescriptions  Medication Sig Dispense Refill  . aspirin 81 MG EC tablet Take 1 tablet (81 mg total) by mouth daily.    Marland Kitchen atorvastatin (LIPITOR) 40 MG tablet Take 40 mg by mouth daily.    . cholecalciferol (VITAMIN D) 1000 UNITS tablet Take 1,000 Units by mouth daily.      . Coenzyme Q10 (CO Q 10 PO) Take 100 mg by mouth daily.     . colchicine 0.6 MG tablet Take 0.6 mg by mouth daily as needed (for gout).     . metoprolol succinate (TOPROL-XL) 25 MG 24 hr tablet Take 1 tablet (25 mg total) by mouth daily. 90 tablet 3  . multivitamin (THERAGRAN) per tablet Take 1 tablet by mouth daily.      . nitroGLYCERIN (NITROSTAT) 0.4 MG SL tablet Place 0.4 mg under the tongue every 5 (five) minutes as needed for chest pain (MAX 3 TABLETS).    . NON FORMULARY Borage Oil as directed    . Omega-3 Fatty Acids (FISH OIL CONCENTRATE PO) Take 1,700 mg  by mouth daily.      . ramipril (ALTACE) 2.5 MG capsule TAKE 1 CAPSULE BY MOUTH DAILY.    . sildenafil (VIAGRA) 100 MG tablet Take 1 tablet (100 mg total) by mouth daily as needed for erectile dysfunction. 5 tablet 11   No current facility-administered medications for this visit.    Allergies:   Review of patient's allergies indicates no known allergies.    Social History:  The patient  reports that he has quit smoking. His smoking use included Cigarettes. He has a 2.5 pack-year smoking history. He has never used smokeless tobacco. He reports that he drinks about 2.4 oz of alcohol per week. He reports that he does not use illicit drugs.   Family History:  The patient's family history includes Cancer  in his mother; Heart disease in his mother; Stroke in his mother.    ROS:  Please see the history of present illness.   Otherwise, review of systems are positive for none.   All other systems are reviewed and negative.    PHYSICAL EXAM: VS:  BP 140/70 mmHg  Pulse 64  Ht 6\' 1"  (1.854 m)  Wt 211 lb 12.8 oz (96.072 kg)  BMI 27.95 kg/m2 , BMI Body mass index is 27.95 kg/(m^2). GEN: Well nourished, well developed, in no acute distress HEENT: normal Neck: no JVD, carotid bruits, or masses Cardiac: RRR; no murmurs, rubs, or gallops,no edema  Respiratory:  clear to auscultation bilaterally, normal work of breathing GI: soft, nontender, nondistended, + BS MS: no deformity or atrophy Skin: warm and dry, no rash Neuro:  Strength and sensation are intact Psych: euthymic mood, full affect   EKG:  EKG is not ordered today.    Recent Labs: 03/14/2014: ALT 40; Pro B Natriuretic peptide (BNP) 3523.0*; TSH 1.400 03/30/2014: BUN 9; Creatinine 0.87; Hemoglobin 14.7; Platelets 171; Potassium 4.5; Sodium 141    Lipid Panel    Component Value Date/Time   CHOL 156 03/15/2014 0237   TRIG 69 03/15/2014 0237   HDL 79 03/15/2014 0237   CHOLHDL 2.0 03/15/2014 0237   VLDL 14 03/15/2014 0237   LDLCALC 63 03/15/2014 0237      Wt Readings from Last 3 Encounters:  09/29/14 211 lb 12.8 oz (96.072 kg)  05/21/14 210 lb 1.9 oz (95.31 kg)  05/05/14 208 lb 4.8 oz (94.484 kg)      Other studies Reviewed: Additional studies/ records that were reviewed today include: none.    ASSESSMENT AND PLAN:  1. Coronary disease status [name] CABG in 2005. He is asymptomatic. He had a normal Myoview study September 2015. We will continue risk factor modification. Continue metoprolol and aspirin therapy.  2. Hyperlipidemia. Excellent control on current therapy.  3. Hypertension-well controlled.  4. Atrial flutter s/p ablation. Maintaining NSR.   5. OSA- now on CPAP. Keep appt. With Dr. Radford [name].    Current  medicines are reviewed at length with the patient today.  The patient does not have concerns regarding medicines.  The following changes have been made:  no change  Labs/ tests ordered today include: none  No orders of the defined types were placed in this encounter.     Disposition:   FU with me in 6 months with fasting lab work including CMET and lipid panel   Signed, Alison Kubicki Martinique, MD  09/29/2014 5:05 PM    [facility] [address], [place], [place]  [address] Phone: [phone]; Fax: [phone]

## 2014-10-03 ENCOUNTER — Other Ambulatory Visit: Payer: Self-pay | Admitting: Internal Medicine

## 2014-10-12 ENCOUNTER — Encounter: Payer: Self-pay | Admitting: Family Medicine

## 2014-10-12 ENCOUNTER — Ambulatory Visit (INDEPENDENT_AMBULATORY_CARE_PROVIDER_SITE_OTHER): Payer: PPO | Admitting: Family Medicine

## 2014-10-12 VITALS — BP 140/80 | HR 61 | Temp 98.0°F | Wt 215.0 lb

## 2014-10-12 DIAGNOSIS — R05 Cough: Secondary | ICD-10-CM

## 2014-10-12 DIAGNOSIS — R059 Cough, unspecified: Secondary | ICD-10-CM

## 2014-10-12 MED ORDER — HYDROCODONE-HOMATROPINE 5-1.5 MG/5ML PO SYRP: 5.0000 mL | ORAL_SOLUTION | Freq: Four times a day (QID) | ORAL | Status: AC | PRN

## 2014-10-12 NOTE — Progress Notes (Signed)
Subjective:    Patient ID: [name] [name], male    DOB: [birth year], 64 y.o.   MRN: [id number]  HPI Acute visit for cough. Patient had onset of cold about one month ago. His cough is mostly dry but occasionally productive of clear sputum. No fever. Cough worse at night and severe at times. Not relieved with over-the-counter medications. Some mild postnasal drainage symptoms. No hemoptysis. No fevers or chills. No GERD symptoms. No wheezing. Nonsmoker.  Chronic problems include history of CAD, obstructive sleep apnea, hypertension, atrial flutter. He does take ramipril 2.5 mg daily. He does still and has background he has some low-grade coughing which is not 3 bothersome but especially worse over the past month.  Past Medical History  Diagnosis Date  . Coronary artery disease     Severe three-vessel coronary artery disease with ejection fraction of 65%  . History of psoriasis   . History of gout   . Hypercholesterolemia   . Chicken pox   . GERD (gastroesophageal reflux disease)   . Hypertension   . Psoriatic arthritis    Past Surgical History  Procedure Laterality Date  . Tee without cardioversion N/A 03/15/2014    Procedure: TRANSESOPHAGEAL ECHOCARDIOGRAM (TEE);  Surgeon: Lelon Perla, MD;  Location: [facility] ENDOSCOPY;  Service: Cardiovascular;  Laterality: N/A;  . Cardioversion N/A 03/15/2014    Procedure: CARDIOVERSION;  Surgeon: Lelon Perla, MD;  Location: [facility] ENDOSCOPY;  Service: Cardiovascular;  Laterality: N/A;  . Atrial flutter ablation  03/30/2014  . Tonsillectomy  1950's  . Cardiac catheterization  09/2003    Ejection fraction was estimated at 65%.  . Coronary artery bypass graft  09/2003    Lima-lad,svg-diag,svg-om/distal LCX,svg-pda  . Atrial flutter ablation N/A 03/30/2014    Procedure: ATRIAL FLUTTER ABLATION;  Surgeon: Coralyn Mark, MD;  Location: [facility] CATH LAB;  Service: Cardiovascular;  Laterality: N/A;    reports that he has quit smoking. His smoking use included  Cigarettes. He has a 2.5 pack-year smoking history. He has never used smokeless tobacco. He reports that he drinks about 2.4 oz of alcohol per week. He reports that he does not use illicit drugs. family history includes Cancer in his mother; Heart disease in his mother; Stroke in his mother. No Known Allergies    Review of Systems  Constitutional: Negative for fever, chills, appetite change and unexpected weight change.  HENT: Positive for postnasal drip.   Respiratory: Positive for cough. Negative for shortness of breath and wheezing.   Cardiovascular: Negative for chest pain.       Objective:   Physical Exam  Constitutional: He appears well-developed and well-nourished.  HENT:  Mouth/Throat: Oropharynx is clear and moist.  Left ear drum normal. Right eardrum partially obscured with cerumen  Neck: Neck supple. No thyromegaly present.  Cardiovascular: Normal rate and regular rhythm.   Pulmonary/Chest: Effort normal and breath sounds normal. No respiratory distress. He has no wheezes. He has no rales.          Assessment & Plan:  Cough. Suspect post viral. Nonfocal exam. He does take ACE inhibitor and may have ACE inhibitor related background cough but current cough started about a month ago worsening after cold. Hycodan cough syrup 1 teaspoon daily at bedtime for severe cough. Consider chest x-ray in 2 weeks if not resolving and would also consider possible change from Ace to angiotensin receptor blocker in 2 weeks if cough not improving. He does take methotrexate but does not have any rales, hypoxemia or  other indicators of fibrosis and had chest x-ray last August which did not show any interstitial changes

## 2014-10-12 NOTE — Progress Notes (Signed)
Pre visit review using our clinic review tool, if applicable. No additional management support is needed unless otherwise documented below in the visit note. 

## 2014-10-12 NOTE — Patient Instructions (Signed)
Cough, Adult  A cough is a reflex that helps clear your throat and airways. It can help heal the body or may be a reaction to an irritated airway. A cough may only last 2 or 3 weeks (acute) or may last more than 8 weeks (chronic).  CAUSES Acute cough:  Viral or bacterial infections. Chronic cough:  Infections.  Allergies.  Asthma.  Post-nasal drip.  Smoking.  Heartburn or acid reflux.  Some medicines.  Chronic lung problems (COPD).  Cancer. SYMPTOMS   Cough.  Fever.  Chest pain.  Increased breathing rate.  High-pitched whistling sound when breathing (wheezing).  Colored mucus that you cough up (sputum). TREATMENT   A bacterial cough may be treated with antibiotic medicine.  A viral cough must run its course and will not respond to antibiotics.  Your caregiver may recommend other treatments if you have a chronic cough. HOME CARE INSTRUCTIONS   Only take over-the-counter or prescription medicines for pain, discomfort, or fever as directed by your caregiver. Use cough suppressants only as directed by your caregiver.  Use a cold steam vaporizer or humidifier in your bedroom or home to help loosen secretions.  Sleep in a semi-upright position if your cough is worse at night.  Rest as needed.  Stop smoking if you smoke. SEEK IMMEDIATE MEDICAL CARE IF:   You have pus in your sputum.  Your cough starts to worsen.  You cannot control your cough with suppressants and are losing sleep.  You begin coughing up blood.  You have difficulty breathing.  You develop pain which is getting worse or is uncontrolled with medicine.  You have a fever. MAKE SURE YOU:   Understand these instructions.  Will watch your condition.  Will get help right away if you are not doing well or get worse. Document Released: 01/05/2011 Document Revised: 10/01/2011 Document Reviewed: 01/05/2011 Exit[employer] Patient Information 2015 [employer]. This information is not intended  to replace advice given to you by your health care provider. Make sure you discuss any questions you have with your health care provider.  

## 2014-10-20 ENCOUNTER — Encounter: Payer: Self-pay | Admitting: Cardiology

## 2014-10-22 ENCOUNTER — Telehealth: Payer: Self-pay | Admitting: *Deleted

## 2014-10-22 DIAGNOSIS — G4733 Obstructive sleep apnea (adult) (pediatric): Secondary | ICD-10-CM

## 2014-10-22 DIAGNOSIS — Z9989 Dependence on other enabling machines and devices: Principal | ICD-10-CM

## 2014-10-22 NOTE — Telephone Encounter (Signed)
Message sent to [facility] for auto titration

## 2014-10-27 ENCOUNTER — Ambulatory Visit: Payer: Medicare Other | Admitting: Cardiology

## 2014-11-17 ENCOUNTER — Encounter: Payer: Self-pay | Admitting: Cardiology

## 2014-11-23 ENCOUNTER — Ambulatory Visit: Payer: PPO | Admitting: Nurse Practitioner

## 2014-11-26 ENCOUNTER — Telehealth: Payer: Self-pay

## 2014-11-26 DIAGNOSIS — G4733 Obstructive sleep apnea (adult) (pediatric): Secondary | ICD-10-CM

## 2014-11-26 NOTE — Telephone Encounter (Signed)
-----   Message from Sueanne Margarita, MD sent at 11/25/2014  7:24 PM EDT ----- Please set CPAP at 10cm H2O and get a d/l in 2 weeks

## 2014-11-26 NOTE — Telephone Encounter (Signed)
Left message for patient that settings are being changed.  Message sent to [facility]. Instructed patient to call if he has any questions or concerns.

## 2014-12-06 ENCOUNTER — Other Ambulatory Visit: Payer: Self-pay | Admitting: Family Medicine

## 2014-12-08 ENCOUNTER — Other Ambulatory Visit: Payer: Self-pay | Admitting: Cardiology

## 2015-01-19 ENCOUNTER — Encounter: Payer: Self-pay | Admitting: Cardiology

## 2015-01-19 ENCOUNTER — Ambulatory Visit (INDEPENDENT_AMBULATORY_CARE_PROVIDER_SITE_OTHER): Payer: PPO | Admitting: Cardiology

## 2015-01-19 VITALS — BP 122/64 | HR 56 | Ht 73.0 in | Wt 213.0 lb

## 2015-01-19 DIAGNOSIS — E669 Obesity, unspecified: Secondary | ICD-10-CM

## 2015-01-19 DIAGNOSIS — G4733 Obstructive sleep apnea (adult) (pediatric): Secondary | ICD-10-CM

## 2015-01-19 DIAGNOSIS — I10 Essential (primary) hypertension: Secondary | ICD-10-CM | POA: Diagnosis not present

## 2015-01-19 NOTE — Progress Notes (Signed)
Cardiology Office Note   Date:  01/19/2015   ID:  [name] [name], [name] [name] [birth year], MRN [id number]  PCP:  [name], MD    Chief Complaint  Patient presents with  . Follow-up    OSA      History of Present Illness: [name] [name] is a 64 y.o. male who presents for evaluation of OSA.  He recently complained to Dr. [name] that he was having problems with snoring per his wife and atrial flutter although his Epworth sleepiness scale was only 2.  He underwent PSG showing moderate OSA with an AHI of 16.9 events per hour mainly occurring in the non supine position and during NREM sleep.  He had oxygen desaturations as low as 87%.  He underwent CPAP titration to 5cm H2O.  He now presents back for followup.  He is doing well with his CPAP device.  He tolerates the nasasl mask but is not using a chin strap and feels the pressure is adequate.  He has no mouth dryness or head congestion on the CPAP.  Since starting the CPAP his wife has not complained that he snores.  he feels more rested in the am and has no daytime sleepiness.    Past Medical History  Diagnosis Date  . Coronary artery disease     Severe three-vessel coronary artery disease with ejection fraction of 65%  . History of psoriasis   . History of gout   . Hypercholesterolemia   . Chicken pox   . GERD (gastroesophageal reflux disease)   . Hypertension   . Psoriatic arthritis     Past Surgical History  Procedure Laterality Date  . Tee without cardioversion N/A 03/15/2014    Procedure: TRANSESOPHAGEAL ECHOCARDIOGRAM (TEE);  Surgeon: Lelon Perla, MD;  Location: [facility] ENDOSCOPY;  Service: Cardiovascular;  Laterality: N/A;  . Cardioversion N/A 03/15/2014    Procedure: CARDIOVERSION;  Surgeon: Lelon Perla, MD;  Location: [facility] ENDOSCOPY;  Service: Cardiovascular;  Laterality: N/A;  . Atrial flutter ablation  03/30/2014  . Tonsillectomy  1950's  . Cardiac catheterization  09/2003    Ejection fraction  was estimated at 65%.  . Coronary artery bypass graft  09/2003    Lima-lad,svg-diag,svg-om/distal LCX,svg-pda  . Atrial flutter ablation N/A 03/30/2014    Procedure: ATRIAL FLUTTER ABLATION;  Surgeon: Coralyn Mark, MD;  Location: [facility] CATH LAB;  Service: Cardiovascular;  Laterality: N/A;     Current Outpatient Prescriptions  Medication Sig Dispense Refill  . aspirin 81 MG EC tablet Take 1 tablet (81 mg total) by mouth daily.    Marland Kitchen atorvastatin (LIPITOR) 40 MG tablet Take 40 mg by mouth daily.    . cholecalciferol (VITAMIN D) 1000 UNITS tablet Take 1,000 Units by mouth daily.      . Coenzyme Q10 (CO Q 10 PO) Take 100 mg by mouth daily.     . colchicine 0.6 MG tablet Take 0.6 mg by mouth daily as needed (for gout).     . metoprolol succinate (TOPROL-XL) 25 MG 24 hr tablet Take 1 tablet (25 mg total) by mouth daily. 90 tablet 3  . multivitamin (THERAGRAN) per tablet Take 1 tablet by mouth daily.      Marland Kitchen NITROSTAT 0.4 MG SL tablet PLACE 1 TABLET UNDER THE TONGUE EVERY 5 MINUTES AS NEEDED FOR CHEST PAIN. 25 tablet 4  . NON FORMULARY Borage Oil as directed    .  Omega-3 Fatty Acids (FISH OIL CONCENTRATE PO) Take 1,700 mg by mouth daily.      . ramipril (ALTACE) 2.5 MG capsule TAKE 1 CAPSULE DAILY. 30 capsule 6  . VIAGRA 100 MG tablet TAKE 1 TABLET EVERY DAY AS NEEDED FOR ERECTILE DYSFUNCTION. 3 tablet 3   No current facility-administered medications for this visit.    Allergies:   Review of patient's allergies indicates no known allergies.    Social History:  The patient  reports that he has quit smoking. His smoking use included Cigarettes. He has a 2.5 pack-year smoking history. He has never used smokeless tobacco. He reports that he drinks about 2.4 oz of alcohol per week. He reports that he does not use illicit drugs.   Family History:  The patient's family history includes Cancer in his mother; Heart disease in his mother; Stroke in his mother.    ROS:  Please see the history of present  illness.   Otherwise, review of systems are positive for none.   All other systems are reviewed and negative.    PHYSICAL EXAM: VS:  BP 122/64 mmHg  Pulse 56  Ht 6\' 1"  (1.854 m)  Wt 213 lb (96.616 kg)  BMI 28.11 kg/m2  SpO2 96% , BMI Body mass index is 28.11 kg/(m^2). GEN: Well nourished, well developed, in no acute distress HEENT: normal Neck: no JVD, carotid bruits, or masses Cardiac: RRR; no murmurs, rubs, or gallops,no edema  Respiratory:  clear to auscultation bilaterally, normal work of breathing GI: soft, nontender, nondistended, + BS MS: no deformity or atrophy Skin: warm and dry, no rash Neuro:  Strength and sensation are intact Psych: euthymic mood, full affect   EKG:  EKG is not ordered today.    Recent Labs: 03/14/2014: ALT 40; Pro B Natriuretic peptide (BNP) 3523.0*; TSH 1.400 03/30/2014: BUN 9; Creatinine, Ser 0.87; Hemoglobin 14.7; Platelets 171; Potassium 4.5; Sodium 141    Lipid Panel    Component Value Date/Time   CHOL 156 03/15/2014 0237   TRIG 69 03/15/2014 0237   HDL 79 03/15/2014 0237   CHOLHDL 2.0 03/15/2014 0237   VLDL 14 03/15/2014 0237   LDLCALC 63 03/15/2014 0237      Wt Readings from Last 3 Encounters:  01/19/15 213 lb (96.616 kg)  10/12/14 215 lb (97.523 kg)  09/29/14 211 lb 12.8 oz (96.072 kg)       ASSESSMENT AND PLAN:  1.  Moderate OSA with an AHI of 16.9 events per hour now on CPAP at 5cm H2O.   He tolerates the device well.  I have also instructed the patient on proper sleep hygiene, avoidance of sleeping in the supine position and avoidance of alcohol within 4 hours of bedtime.  The patient was also instructed to avoid driving if sleepy.  His d/l today showed an AHI of 8.6 on 5cm H2O. I have reviewed his 2 week autotitration and will increase his CPAP to 10cm H2O and add a chin strap and get a d/l in 2 weeks.   2.  HTN - controlled on BB and ACE I 3.  Obesity    Current medicines are reviewed at length with the patient today.   The patient does not have concerns regarding medicines.  The following changes have been made:  no change  Labs/ tests ordered today: See above Assessment and Plan No orders of the defined types were placed in this encounter.     Disposition:   FU with me in 6 months  Lurena Nida, MD  01/19/2015 9:09 AM    [facility] [place], [place],   [address] Phone: [phone]; Fax: [phone]

## 2015-01-19 NOTE — Patient Instructions (Signed)
Medication Instructions:  Your physician recommends that you continue on your current medications as directed. Please refer to the Current Medication list given to you today.   Labwork: None  Testing/Procedures: None  Follow-Up: Your physician wants you to follow-up in: 6 months with Dr. Radford Pax. You will receive a reminder letter in the mail two months in advance. If you don't receive a letter, please call our office to schedule the follow-up appointment.   Any Other Special Instructions Will Be Listed Below (If Applicable). [facility] will be in touch with you soon to increase your CPAP pressure. Please start using your chin strap. Thank you!

## 2015-02-16 ENCOUNTER — Encounter: Payer: Self-pay | Admitting: Cardiology

## 2015-02-24 ENCOUNTER — Other Ambulatory Visit: Payer: Self-pay | Admitting: Cardiology

## 2015-04-11 ENCOUNTER — Other Ambulatory Visit: Payer: Self-pay | Admitting: Internal Medicine

## 2015-05-09 ENCOUNTER — Other Ambulatory Visit: Payer: Self-pay | Admitting: Family Medicine

## 2015-07-28 ENCOUNTER — Encounter: Payer: Self-pay | Admitting: Cardiology

## 2015-07-28 NOTE — Progress Notes (Signed)
Cardiology Office Note   Date:  07/29/2015   ID:  YASMANI GADWAY, DOB [birth year], MRN [id number]  PCP:  [name], MD    Chief Complaint  Patient presents with  . Sleep Apnea  . Hypertension      History of Present Illness: [name] [name] is a 65 y.o. male who presents for followup of OSA. He has  moderate OSA with an AHI of 16.9 events per hour mainly occurring in the non supine position and during NREM sleep. He is on CPAP at 10cm H2O.  He is doing well with his CPAP device. He tolerates the nasasl mask but is not using a chin strap and feels the pressure is adequate. He has no mouth dryness or head congestion on the CPAP. Since starting the CPAP his wife has not complained that he snores. He feels more rested in the am and has no daytime sleepiness but occasionally takes a nap in the evening watching TV.Marland Kitchen     Past Medical History  Diagnosis Date  . Coronary artery disease     Severe three-vessel coronary artery disease with ejection fraction of 65%  . History of psoriasis   . History of gout   . Hypercholesterolemia   . Chicken pox   . GERD (gastroesophageal reflux disease)   . Hypertension   . Psoriatic arthritis ([place])   . OSA (obstructive sleep apnea)     Moderate with AHI of 16.9/hr now on CPAP at 10cm H2O    Past Surgical History  Procedure Laterality Date  . Tee without cardioversion N/A 03/15/2014    Procedure: TRANSESOPHAGEAL ECHOCARDIOGRAM (TEE);  Surgeon: Lelon Perla, MD;  Location: [facility] ENDOSCOPY;  Service: Cardiovascular;  Laterality: N/A;  . Cardioversion N/A 03/15/2014    Procedure: CARDIOVERSION;  Surgeon: Lelon Perla, MD;  Location: [facility] ENDOSCOPY;  Service: Cardiovascular;  Laterality: N/A;  . Atrial flutter ablation  03/30/2014  . Tonsillectomy  1950's  . Cardiac catheterization  09/2003    Ejection fraction was estimated at 65%.  . Coronary artery bypass graft  09/2003    Lima-lad,svg-diag,svg-om/distal  LCX,svg-pda  . Atrial flutter ablation N/A 03/30/2014    Procedure: ATRIAL FLUTTER ABLATION;  Surgeon: Coralyn Mark, MD;  Location: [facility] CATH LAB;  Service: Cardiovascular;  Laterality: N/A;     Current Outpatient Prescriptions  Medication Sig Dispense Refill  . aspirin 81 MG EC tablet Take 1 tablet (81 mg total) by mouth daily.    Marland Kitchen atorvastatin (LIPITOR) 40 MG tablet TAKE 1 TABLET DAILY. 30 tablet 6  . cholecalciferol (VITAMIN D) 1000 UNITS tablet Take 1,000 Units by mouth daily.      . Coenzyme Q10 (CO Q 10 PO) Take 100 mg by mouth daily.     . colchicine 0.6 MG tablet TAKE 1 TABLET TWICE DAILY. 10 tablet 0  . metoprolol succinate (TOPROL-XL) 25 MG 24 hr tablet TAKE 1 TABLET ONCE DAILY. 30 tablet 10  . multivitamin (THERAGRAN) per tablet Take 1 tablet by mouth daily.      Marland Kitchen NITROSTAT 0.4 MG SL tablet PLACE 1 TABLET UNDER THE TONGUE EVERY 5 MINUTES AS NEEDED FOR CHEST PAIN. 25 tablet 4  . NON FORMULARY Borage Oil as directed    . Omega-3 Fatty Acids (FISH OIL CONCENTRATE PO) Take 1,700 mg by mouth daily.      . ramipril (ALTACE) 2.5 MG capsule TAKE 1 CAPSULE DAILY.  30 capsule 6  . VIAGRA 100 MG tablet TAKE 1 TABLET EVERY DAY AS NEEDED FOR ERECTILE DYSFUNCTION. 3 tablet 3   No current facility-administered medications for this visit.    Allergies:   Review of patient's allergies indicates no known allergies.    Social History:  The patient  reports that he has quit smoking. His smoking use included Cigarettes. He has a 2.5 pack-year smoking history. He has never used smokeless tobacco. He reports that he drinks about 2.4 oz of alcohol per week. He reports that he does not use illicit drugs.   Family History:  The patient's family history includes Cancer in his mother; Heart disease in his mother; Stroke in his mother.    ROS:  Please see the history of present illness.   Otherwise, review of systems are positive for none.   All other systems are reviewed and negative.    PHYSICAL  EXAM: VS:  BP 150/88 mmHg  Pulse 64  Ht 6\' 1"  (1.854 m)  Wt 217 lb 9.6 oz (98.703 kg)  BMI 28.72 kg/m2  SpO2 97% , BMI Body mass index is 28.72 kg/(m^2). GEN: Well nourished, well developed, in no acute distress HEENT: normal Neck: no JVD, carotid bruits, or masses Cardiac: RRR; no murmurs, rubs, or gallops,no edema  Respiratory:  clear to auscultation bilaterally, normal work of breathing GI: soft, nontender, nondistended, + BS MS: no deformity or atrophy Skin: warm and dry, no rash Neuro:  Strength and sensation are intact Psych: euthymic mood, full affect   EKG:  EKG is not ordered today.    Recent Labs: No results found for requested labs within last 365 days.    Lipid Panel    Component Value Date/Time   CHOL 156 03/15/2014 0237   TRIG 69 03/15/2014 0237   HDL 79 03/15/2014 0237   CHOLHDL 2.0 03/15/2014 0237   VLDL 14 03/15/2014 0237   LDLCALC 63 03/15/2014 0237      Wt Readings from Last 3 Encounters:  07/29/15 217 lb 9.6 oz (98.703 kg)  01/19/15 213 lb (96.616 kg)  10/12/14 215 lb (97.523 kg)     ASSESSMENT AND PLAN:  1. Moderate OSA with an AHI of 16.9 events per hour now on CPAP at 10cm H2O. He tolerates the device well.  His d/l today showed an AHI of 1.6/hr on 10cm H2O and 100% compliance in using more than 4 hours nightly.   2. HTN - borderline controlled on BB and ACE I.  He just took his BP meds.  3. Obesity - encouraged him to get into a routine exercise and diet program   Current medicines are reviewed at length with the patient today.  The patient has no concerns regarding medicines.  The following changes have been made:  no change  Labs/ tests ordered today: See above Assessment and Plan No orders of the defined types were placed in this encounter.     Disposition:   FU with me in 1 year  Signed, Sueanne Margarita, MD  07/29/2015 8:33 AM    [facility] [place], [place], [place]  [address] Phone: [phone]; Fax: [phone]

## 2015-07-29 ENCOUNTER — Ambulatory Visit (INDEPENDENT_AMBULATORY_CARE_PROVIDER_SITE_OTHER): Payer: PPO | Admitting: Cardiology

## 2015-07-29 ENCOUNTER — Encounter: Payer: Self-pay | Admitting: Cardiology

## 2015-07-29 VITALS — BP 150/88 | HR 64 | Ht 73.0 in | Wt 217.6 lb

## 2015-07-29 DIAGNOSIS — E669 Obesity, unspecified: Secondary | ICD-10-CM | POA: Diagnosis not present

## 2015-07-29 DIAGNOSIS — G4733 Obstructive sleep apnea (adult) (pediatric): Secondary | ICD-10-CM

## 2015-07-29 DIAGNOSIS — I10 Essential (primary) hypertension: Secondary | ICD-10-CM | POA: Diagnosis not present

## 2015-07-29 NOTE — Patient Instructions (Signed)

## 2015-08-04 ENCOUNTER — Encounter: Payer: Self-pay | Admitting: Cardiology

## 2015-08-04 DIAGNOSIS — F341 Dysthymic disorder: Secondary | ICD-10-CM | POA: Diagnosis not present

## 2015-08-31 DIAGNOSIS — F341 Dysthymic disorder: Secondary | ICD-10-CM | POA: Diagnosis not present

## 2015-09-01 ENCOUNTER — Other Ambulatory Visit: Payer: Self-pay | Admitting: Family Medicine

## 2015-09-20 DIAGNOSIS — F341 Dysthymic disorder: Secondary | ICD-10-CM | POA: Diagnosis not present

## 2015-09-29 DIAGNOSIS — F341 Dysthymic disorder: Secondary | ICD-10-CM | POA: Diagnosis not present

## 2015-09-30 ENCOUNTER — Other Ambulatory Visit: Payer: Self-pay | Admitting: Family Medicine

## 2015-11-14 ENCOUNTER — Other Ambulatory Visit: Payer: Self-pay | Admitting: Cardiology

## 2015-11-15 NOTE — Telephone Encounter (Signed)
Patient called no answer.Left message on personal voice mail to call me back to schedule follow up appointment with Dr.Jordan.

## 2015-11-16 ENCOUNTER — Telehealth: Payer: Self-pay | Admitting: Cardiology

## 2015-11-16 NOTE — Telephone Encounter (Signed)
Spoke to patient follow up appointment scheduled with Dr.Jordan 02/21/16 at 8:45 am.Altace refill sent to pharmacy.

## 2015-11-16 NOTE — Telephone Encounter (Signed)
Pt is returning your call from yesterday. Please f/u with him  Thanks

## 2015-11-16 NOTE — Telephone Encounter (Signed)
Returned call to patient.Follow up appointment scheduled with Dr.Jordan 02/21/16 at 8:45 am.Altace refill sent to pharmacy.

## 2015-11-23 ENCOUNTER — Other Ambulatory Visit (INDEPENDENT_AMBULATORY_CARE_PROVIDER_SITE_OTHER): Payer: PPO

## 2015-11-23 DIAGNOSIS — Z0000 Encounter for general adult medical examination without abnormal findings: Secondary | ICD-10-CM | POA: Diagnosis not present

## 2015-11-23 LAB — LIPID PANEL
Cholesterol: 162 mg/dL (ref 0–200)
HDL: 61.2 mg/dL (ref >39.00)
LDL CALC: 85 mg/dL (ref 0–99)
NonHDL: 101.27
Total CHOL/HDL Ratio: 3
Triglycerides: 82 mg/dL (ref 0.0–149.0)
VLDL: 16.4 mg/dL (ref 0.0–40.0)

## 2015-11-23 LAB — CBC WITH DIFFERENTIAL/PLATELET
BASOS PCT: 0.6 % (ref 0.0–3.0)
Basophils Absolute: 0 10*3/uL (ref 0.0–0.1)
EOS ABS: 0.1 10*3/uL (ref 0.0–0.7)
Eosinophils Relative: 2.1 % (ref 0.0–5.0)
HCT: 43.5 % (ref 39.0–52.0)
Hemoglobin: 15 g/dL (ref 13.0–17.0)
Lymphocytes Relative: 27.7 % (ref 12.0–46.0)
Lymphs Abs: 1.6 10*3/uL (ref 0.7–4.0)
MCHC: 34.5 g/dL (ref 30.0–36.0)
MCV: 91.4 fl (ref 78.0–100.0)
MONO ABS: 0.7 10*3/uL (ref 0.1–1.0)
Monocytes Relative: 12 % (ref 3.0–12.0)
NEUTROS PCT: 57.6 % (ref 43.0–77.0)
Neutro Abs: 3.2 10*3/uL (ref 1.4–7.7)
PLATELETS: 147 10*3/uL — AB (ref 150.0–400.0)
RBC: 4.76 Mil/uL (ref 4.22–5.81)
RDW: 13.2 % (ref 11.5–15.5)
WBC: 5.6 10*3/uL (ref 4.0–10.5)

## 2015-11-23 LAB — BASIC METABOLIC PANEL
BUN: 12 mg/dL (ref 6–23)
CHLORIDE: 106 meq/L (ref 96–112)
CO2: 27 mEq/L (ref 19–32)
CREATININE: 0.88 mg/dL (ref 0.40–1.50)
Calcium: 9.7 mg/dL (ref 8.4–10.5)
GFR: 91.74 mL/min (ref >60.00)
Glucose, Bld: 100 mg/dL — ABNORMAL HIGH (ref 70–99)
Potassium: 4.1 mEq/L (ref 3.5–5.1)
Sodium: 141 mEq/L (ref 135–145)

## 2015-11-23 LAB — HEPATIC FUNCTION PANEL
ALT: 24 U/L (ref 0–53)
AST: 20 U/L (ref 0–37)
Albumin: 4.3 g/dL (ref 3.5–5.2)
Alkaline Phosphatase: 64 U/L (ref 39–117)
BILIRUBIN DIRECT: 0.2 mg/dL (ref 0.0–0.3)
BILIRUBIN TOTAL: 0.9 mg/dL (ref 0.2–1.2)
Total Protein: 6.4 g/dL (ref 6.0–8.3)

## 2015-11-23 LAB — TSH: TSH: 1.08 u[IU]/mL (ref 0.35–4.50)

## 2015-11-23 LAB — PSA: PSA: 0.84 ng/mL (ref 0.10–4.00)

## 2015-11-28 ENCOUNTER — Ambulatory Visit (INDEPENDENT_AMBULATORY_CARE_PROVIDER_SITE_OTHER): Payer: PPO | Admitting: Family Medicine

## 2015-11-28 VITALS — BP 130/74 | HR 55 | Temp 97.9°F | Ht 73.0 in | Wt 216.3 lb

## 2015-11-28 DIAGNOSIS — Z0000 Encounter for general adult medical examination without abnormal findings: Secondary | ICD-10-CM | POA: Diagnosis not present

## 2015-11-28 MED ORDER — COLCHICINE 0.6 MG PO TABS: 0.6000 mg | ORAL_TABLET | Freq: Two times a day (BID) | ORAL | Status: DC

## 2015-11-28 NOTE — Progress Notes (Signed)
Pre visit review using our clinic review tool, if applicable. No additional management support is needed unless otherwise documented below in the visit note. 

## 2015-11-28 NOTE — Patient Instructions (Signed)
Make sure you set up follow up with Dermatology to get mole checked on your abdomen. Recommend Pneumovax when you return from your trip.

## 2015-11-28 NOTE — Progress Notes (Signed)
Subjective:    Patient ID: [name] [name], male    DOB: 01/10/1951, 65 y.o.   MRN: [id number]  HPI Patient seen for physical exam. Past medical history significant for history of CAD, atrial flutter, hypertension, obstructive sleep apnea, psoriasis, hyperlipidemia. Also history of gout. He had bypass 2005. Recent atrial ablation procedure for atrial fibrillation. No recent chest pains.  Patient had Prevnar 13 is still needs Pneumovax. He has upcoming trip next week and prefers to defer until after then. He is using CPAP regularly. Last colonoscopy reported about 4-5 years ago. Followed by dermatology regarding his psoriasis. He has decided against any immunosuppressants at this time.  Past Medical History  Diagnosis Date  . Coronary artery disease     Severe three-vessel coronary artery disease with ejection fraction of 65%  . History of psoriasis   . History of gout   . Hypercholesterolemia   . Chicken pox   . GERD (gastroesophageal reflux disease)   . Hypertension   . Psoriatic arthritis ([place])   . OSA (obstructive sleep apnea)     Moderate with AHI of 16.9/hr now on CPAP at 10cm H2O   Past Surgical History  Procedure Laterality Date  . Tee without cardioversion N/A 03/15/2014    Procedure: TRANSESOPHAGEAL ECHOCARDIOGRAM (TEE);  Surgeon: Lelon Perla, MD;  Location: [facility] ENDOSCOPY;  Service: Cardiovascular;  Laterality: N/A;  . Cardioversion N/A 03/15/2014    Procedure: CARDIOVERSION;  Surgeon: Lelon Perla, MD;  Location: [facility] ENDOSCOPY;  Service: Cardiovascular;  Laterality: N/A;  . Atrial flutter ablation  03/30/2014  . Tonsillectomy  1950's  . Cardiac catheterization  09/2003    Ejection fraction was estimated at 65%.  . Coronary artery bypass graft  09/2003    Lima-lad,svg-diag,svg-om/distal LCX,svg-pda  . Atrial flutter ablation N/A 03/30/2014    Procedure: ATRIAL FLUTTER ABLATION;  Surgeon: Coralyn Mark, MD;  Location: [place] CATH LAB;  Service: Cardiovascular;   Laterality: N/A;    reports that he has quit smoking. His smoking use included Cigarettes. He has a 2.5 pack-year smoking history. He has never used smokeless tobacco. He reports that he drinks about 2.4 oz of alcohol per week. He reports that he does not use illicit drugs. family history includes Cancer in his mother; Heart disease in his mother; Stroke in his mother. No Known Allergies    Review of Systems  Constitutional: Negative for fever, activity change, appetite change, fatigue and unexpected weight change.  HENT: Negative for congestion, ear pain and trouble swallowing.   Eyes: Negative for pain and visual disturbance.  Respiratory: Negative for cough, shortness of breath and wheezing.   Cardiovascular: Negative for chest pain and palpitations.  Gastrointestinal: Negative for nausea, vomiting, abdominal pain, diarrhea, constipation, blood in stool, abdominal distention and rectal pain.  Genitourinary: Negative for dysuria, hematuria and testicular pain.  Musculoskeletal: Negative for joint swelling and arthralgias.  Skin: Positive for rash.  Neurological: Negative for dizziness, syncope and headaches.  Hematological: Negative for adenopathy.  Psychiatric/Behavioral: Negative for confusion and dysphoric mood.       Objective:   Physical Exam  Constitutional: He is oriented to person, place, and time. He appears well-developed and well-nourished. No distress.  HENT:  Head: Normocephalic and atraumatic.  Right Ear: External ear normal.  Left Ear: External ear normal.  Mouth/Throat: Oropharynx is clear and moist.  Eyes: Conjunctivae and EOM are normal. Pupils are equal, round, and reactive to light.  Neck: Normal range of motion. Neck supple. No thyromegaly  present.  Cardiovascular: Normal rate, regular rhythm and normal heart sounds.   No murmur heard. Pulmonary/Chest: No respiratory distress. He has no wheezes. He has no rales.  Abdominal: Soft. Bowel sounds are normal. He  exhibits no distension and no mass. There is no tenderness. There is no rebound and no guarding.  Musculoskeletal: He exhibits no edema.  Lymphadenopathy:    He has no cervical adenopathy.  Neurological: He is alert and oriented to person, place, and time. He displays normal reflexes. No cranial nerve deficit.  Skin: Rash noted.  Patient has several areas of psoriasiform rash with erythematous base and thick well-demarcated scale  Right lower abdomen 18 x 14 mm pigmented lesion with minimal asymmetry but fairly distinct border. He states this has been followed closely by dermatology in the past  Psychiatric: He has a normal mood and affect.          Assessment & Plan:  Physical exam. Multiple medical problems as above. Patient needs Pneumovax and will return after his upcoming trip for that per his preference. Other immunizations up-to-date. Continue yearly flu vaccine. Labs reviewed with no major concerns. We have strongly advocated that he follow-up with dermatology regarding pigmented abdominal lesion and he states he will set this up promptly. He's been followed yearly by dermatology  [name] MD [facility]

## 2015-11-29 DIAGNOSIS — F341 Dysthymic disorder: Secondary | ICD-10-CM | POA: Diagnosis not present

## 2015-12-06 DIAGNOSIS — F341 Dysthymic disorder: Secondary | ICD-10-CM | POA: Diagnosis not present

## 2015-12-15 DIAGNOSIS — D485 Neoplasm of uncertain behavior of skin: Secondary | ICD-10-CM | POA: Diagnosis not present

## 2015-12-15 DIAGNOSIS — G4733 Obstructive sleep apnea (adult) (pediatric): Secondary | ICD-10-CM | POA: Diagnosis not present

## 2015-12-15 DIAGNOSIS — D0359 Melanoma in situ of other part of trunk: Secondary | ICD-10-CM | POA: Diagnosis not present

## 2016-01-05 ENCOUNTER — Other Ambulatory Visit: Payer: Self-pay | Admitting: Cardiology

## 2016-01-05 NOTE — Telephone Encounter (Signed)
Rx(s) sent to pharmacy electronically.  

## 2016-01-13 DIAGNOSIS — D0359 Melanoma in situ of other part of trunk: Secondary | ICD-10-CM | POA: Diagnosis not present

## 2016-01-29 ENCOUNTER — Encounter (HOSPITAL_COMMUNITY): Payer: Self-pay | Admitting: *Deleted

## 2016-01-29 ENCOUNTER — Ambulatory Visit (HOSPITAL_COMMUNITY)
Admission: EM | Admit: 2016-01-29 | Discharge: 2016-01-29 | Disposition: A | Discharge status: Home / Self Care | Payer: PPO | Attending: Family Medicine | Admitting: Family Medicine

## 2016-01-29 ENCOUNTER — Ambulatory Visit (HOSPITAL_COMMUNITY): Payer: PPO

## 2016-01-29 DIAGNOSIS — E7800 Pure hypercholesterolemia, unspecified: Secondary | ICD-10-CM | POA: Insufficient documentation

## 2016-01-29 DIAGNOSIS — I2510 Atherosclerotic heart disease of native coronary artery without angina pectoris: Secondary | ICD-10-CM | POA: Insufficient documentation

## 2016-01-29 DIAGNOSIS — W19XXXA Unspecified fall, initial encounter: Secondary | ICD-10-CM | POA: Diagnosis not present

## 2016-01-29 DIAGNOSIS — G4733 Obstructive sleep apnea (adult) (pediatric): Secondary | ICD-10-CM | POA: Insufficient documentation

## 2016-01-29 DIAGNOSIS — Z7982 Long term (current) use of aspirin: Secondary | ICD-10-CM | POA: Diagnosis not present

## 2016-01-29 DIAGNOSIS — Z87891 Personal history of nicotine dependence: Secondary | ICD-10-CM | POA: Insufficient documentation

## 2016-01-29 DIAGNOSIS — I10 Essential (primary) hypertension: Secondary | ICD-10-CM | POA: Diagnosis not present

## 2016-01-29 DIAGNOSIS — K219 Gastro-esophageal reflux disease without esophagitis: Secondary | ICD-10-CM | POA: Insufficient documentation

## 2016-01-29 DIAGNOSIS — Z79899 Other long term (current) drug therapy: Secondary | ICD-10-CM | POA: Insufficient documentation

## 2016-01-29 DIAGNOSIS — S63502A Unspecified sprain of left wrist, initial encounter: Secondary | ICD-10-CM | POA: Insufficient documentation

## 2016-01-29 DIAGNOSIS — Y9301 Activity, walking, marching and hiking: Secondary | ICD-10-CM | POA: Insufficient documentation

## 2016-01-29 DIAGNOSIS — M7989 Other specified soft tissue disorders: Secondary | ICD-10-CM | POA: Diagnosis not present

## 2016-01-29 DIAGNOSIS — L4050 Arthropathic psoriasis, unspecified: Secondary | ICD-10-CM | POA: Diagnosis not present

## 2016-01-29 DIAGNOSIS — M25532 Pain in left wrist: Secondary | ICD-10-CM | POA: Diagnosis not present

## 2016-01-29 NOTE — Discharge Instructions (Signed)
Wear splint for comfort and swelling, also ice and advil as needed, see orthopedist if further problems.

## 2016-01-29 NOTE — ED Notes (Signed)
Pt reports   He  [address]   And     Injured   His  l    Wrist   This  Am         While  Walking   On  A  Trail      He   Has  Abrasions  To  Both knees    As   Well  As   Some  Bruising   To  r  Hand

## 2016-01-29 NOTE — ED Provider Notes (Signed)
CSN: [id number]     Arrival date & time 01/29/16  1603 History   First MD Initiated Contact with Patient 01/29/16 1638     Chief Complaint  Patient presents with  . Wrist Pain   (Consider location/radiation/quality/duration/timing/severity/associated sxs/prior Treatment) Patient is a 65 y.o. male presenting with wrist pain. The history is provided by the patient and the spouse.  Wrist Pain This is a new problem. The current episode started 6 to 12 hours ago (fell while hiking this am, landing on both wrists, left is swollen and sore.). The problem has been gradually worsening.    Past Medical History  Diagnosis Date  . Coronary artery disease     Severe three-vessel coronary artery disease with ejection fraction of 65%  . History of psoriasis   . History of gout   . Hypercholesterolemia   . Chicken pox   . GERD (gastroesophageal reflux disease)   . Hypertension   . Psoriatic arthritis ([place])   . OSA (obstructive sleep apnea)     Moderate with AHI of 16.9/hr now on CPAP at 10cm H2O   Past Surgical History  Procedure Laterality Date  . Tee without cardioversion N/A 03/15/2014    Procedure: TRANSESOPHAGEAL ECHOCARDIOGRAM (TEE);  Surgeon: Lelon Perla, MD;  Location: [facility] ENDOSCOPY;  Service: Cardiovascular;  Laterality: N/A;  . Cardioversion N/A 03/15/2014    Procedure: CARDIOVERSION;  Surgeon: Lelon Perla, MD;  Location: [facility] ENDOSCOPY;  Service: Cardiovascular;  Laterality: N/A;  . Atrial flutter ablation  03/30/2014  . Tonsillectomy  1950's  . Cardiac catheterization  09/2003    Ejection fraction was estimated at 65%.  . Coronary artery bypass graft  09/2003    Lima-lad,svg-diag,svg-om/distal LCX,svg-pda  . Atrial flutter ablation N/A 03/30/2014    Procedure: ATRIAL FLUTTER ABLATION;  Surgeon: Coralyn Mark, MD;  Location: [place] CATH LAB;  Service: Cardiovascular;  Laterality: N/A;   Family History  Problem Relation Age of Onset  . Heart disease Mother     cabg  . Cancer  Mother     breast  . Stroke Mother    Social History  Substance Use Topics  . Smoking status: Former Smoker -- 0.50 packs/day for 5 years    Types: Cigarettes  . Smokeless tobacco: Never Used     Comment: "quit smoking cigarettes in the late 1970's"  . Alcohol Use: 2.4 oz/week    2 Cans of beer, 2 Shots of liquor per week    Review of Systems  Constitutional: Negative.   Musculoskeletal: Positive for joint swelling. Negative for myalgias and gait problem.  Skin: Negative.   All other systems reviewed and are negative.   Allergies  Review of patient's allergies indicates no known allergies.  Home Medications   Prior to Admission medications   Medication Sig Start Date End Date Taking? Authorizing Provider  aspirin 81 MG EC tablet Take 1 tablet (81 mg total) by mouth daily. 03/16/14   Rhonda G Barrett, PA-C  atorvastatin (LIPITOR) 40 MG tablet TAKE 1 TABLET DAILY. 02/24/15   Peter M Martinique, MD  cholecalciferol (VITAMIN D) 1000 UNITS tablet Take 1,000 Units by mouth daily.      Historical Provider, MD  Coenzyme Q10 (CO Q 10 PO) Take 100 mg by mouth daily.     Historical Provider, MD  colchicine 0.6 MG tablet Take 1 tablet (0.6 mg total) by mouth 2 (two) times daily. 11/28/15   [name], MD  metoprolol succinate (TOPROL-XL) 25 MG 24 hr tablet  TAKE 1 TABLET ONCE DAILY. 04/12/15   Thompson Grayer, MD  multivitamin [facility]) per tablet Take 1 tablet by mouth daily.      Historical Provider, MD  NITROSTAT 0.4 MG SL tablet PLACE 1 TABLET UNDER THE TONGUE EVERY 5 MINUTES AS NEEDED FOR CHEST PAIN. 01/05/16   Peter M Martinique, MD  NON FORMULARY Borage Oil as directed    Historical Provider, MD  Omega-3 Fatty Acids (FISH OIL CONCENTRATE PO) Take 1,700 mg by mouth daily.      Historical Provider, MD  ramipril (ALTACE) 2.5 MG capsule TAKE 1 CAPSULE DAILY. 11/16/15   Peter M Martinique, MD  VIAGRA 100 MG tablet TAKE 1 TABLET EVERY DAY AS NEEDED FOR ERECTILE DYSFUNCTION. 12/06/14   [name],  MD   Meds Ordered and Administered this Visit  Medications - No data to display  BP 182/76 mmHg  Pulse 78  Temp(Src) 98.6 F (37 C) (Oral)  Resp 18  SpO2 100% No data found.   Physical Exam  Constitutional: He is oriented to person, place, and time. He appears well-developed and well-nourished.  Musculoskeletal: He exhibits tenderness.       Left wrist: He exhibits decreased range of motion, tenderness, swelling, effusion and deformity. He exhibits no crepitus and no laceration.  Neurological: He is alert and oriented to person, place, and time.  Skin: Skin is warm and dry.  Nursing note and vitals reviewed.   ED Course  Procedures (including critical care time)  Labs Review Labs Reviewed - No data to display  Imaging Review Dg Wrist Complete Left  01/29/2016  CLINICAL DATA:  Status post fall onto outstretched hand, with pain and swelling about the left wrist. Initial encounter. EXAM: LEFT WRIST - COMPLETE 3+ VIEW COMPARISON:  None. FINDINGS: There is no evidence of fracture or dislocation. The carpal rows are intact, and demonstrate normal alignment. Subcortical cystic change is noted at the distal scaphoid. The joint spaces are preserved. Palmar soft tissue swelling is noted about the hand and wrist. IMPRESSION: No evidence of fracture or dislocation. Electronically Signed   By: Garald Balding M.D.   On: 01/29/2016 17:23   X-rays reviewed and report per radiologist.   Visual Acuity Review  Right Eye Distance:   Left Eye Distance:   Bilateral Distance:    Right Eye Near:   Left Eye Near:    Bilateral Near:         MDM   1. Wrist sprain, left, initial encounter        Billy Fischer, MD 01/29/16 1737

## 2016-01-31 DIAGNOSIS — D0359 Melanoma in situ of other part of trunk: Secondary | ICD-10-CM | POA: Diagnosis not present

## 2016-01-31 DIAGNOSIS — D485 Neoplasm of uncertain behavior of skin: Secondary | ICD-10-CM | POA: Diagnosis not present

## 2016-02-07 DIAGNOSIS — F341 Dysthymic disorder: Secondary | ICD-10-CM | POA: Diagnosis not present

## 2016-02-20 NOTE — Progress Notes (Signed)
Cardiology Office Note   Date:  02/21/2016   ID:  [name] [name], [name] [name] 08-18-1950, MRN [id number]  PCP:  [name] Post, MD d Cardiologist:   [name] Martinique, MD   Chief Complaint  Patient presents with  . Follow-up    pt states no Sx.  . Coronary Artery Disease      History of Present Illness: [name] [name] is a 65 y.o. male who presents for follow up of atrial flutter and CAD. He is status [name] CABG in 2005. He had a normal stress echo in May of 2013. In August 2015 he presented with atrial flutter with RVR. He had mildly elevated troponins. He had successful TEE guided DCCV. He then underwent ablation of atrial flutter and ectopic atrial tachycardia on 03/30/14 by Dr. [name].  Stress Myoview September 2015 demonstrated excellent exercise tolerance and normal perfusion. TEE showed normal LV function, mild biatrial enlargement, mild MR,TR,AI. He does have moderate obstructive sleep apnea followed by Dr. Radford Pax and is on CPAP therapy. Today he reports he is doing well. No chest pain or SOB. He is very active going to a [employer] exercise program 3/week at [facility] and also hiking a lot in preparation for a trip to [place]. No palpitations.    Past Medical History:  Diagnosis Date  . Chicken pox   . Coronary artery disease    Severe three-vessel coronary artery disease with ejection fraction of 65%  . GERD (gastroesophageal reflux disease)   . History of gout   . History of psoriasis   . Hypercholesterolemia   . Hypertension   . OSA (obstructive sleep apnea)    Moderate with AHI of 16.9/hr now on CPAP at 10cm H2O  . Psoriatic arthritis [facility])     Past Surgical History:  Procedure Laterality Date  . ATRIAL FLUTTER ABLATION  03/30/2014  . ATRIAL FLUTTER ABLATION N/A 03/30/2014   Procedure: ATRIAL FLUTTER ABLATION;  Surgeon: Coralyn Mark, MD;  Location: [place] CATH LAB;  Service: Cardiovascular;  Laterality: N/A;  . CARDIAC CATHETERIZATION  09/2003   Ejection fraction was estimated at  65%.  . CARDIOVERSION N/A 03/15/2014   Procedure: CARDIOVERSION;  Surgeon: Lelon Perla, MD;  Location: [place];  Service: Cardiovascular;  Laterality: N/A;  . CORONARY ARTERY BYPASS GRAFT  09/2003   Lima-lad,svg-diag,svg-om/distal LCX,svg-pda  . TEE WITHOUT CARDIOVERSION N/A 03/15/2014   Procedure: TRANSESOPHAGEAL ECHOCARDIOGRAM (TEE);  Surgeon: Lelon Perla, MD;  Location: [facility] ENDOSCOPY;  Service: Cardiovascular;  Laterality: N/A;  . TONSILLECTOMY  1950's     Current Outpatient Prescriptions  Medication Sig Dispense Refill  . aspirin 81 MG EC tablet Take 1 tablet (81 mg total) by mouth daily.    Marland Kitchen atorvastatin (LIPITOR) 40 MG tablet TAKE 1 TABLET DAILY. 30 tablet 6  . cholecalciferol (VITAMIN D) 1000 UNITS tablet Take 1,000 Units by mouth daily.      . Coenzyme Q10 (CO Q 10 PO) Take 100 mg by mouth daily.     . colchicine 0.6 MG tablet Take 1 tablet (0.6 mg total) by mouth 2 (two) times daily. 30 tablet 5  . metoprolol succinate (TOPROL-XL) 25 MG 24 hr tablet TAKE 1 TABLET ONCE DAILY. 30 tablet 10  . multivitamin (THERAGRAN) per tablet Take 1 tablet by mouth daily.      Marland Kitchen NITROSTAT 0.4 MG SL tablet PLACE 1 TABLET UNDER THE TONGUE EVERY 5 MINUTES AS NEEDED FOR CHEST PAIN. 25 tablet 2  . NON FORMULARY Borage Oil as directed    .  Omega-3 Fatty Acids (FISH OIL CONCENTRATE PO) Take 1,700 mg by mouth daily.      . ramipril (ALTACE) 2.5 MG capsule TAKE 1 CAPSULE DAILY. 30 capsule 6  . VIAGRA 100 MG tablet TAKE 1 TABLET EVERY DAY AS NEEDED FOR ERECTILE DYSFUNCTION. 3 tablet 3   No current facility-administered medications for this visit.     Allergies:   Review of patient's allergies indicates no known allergies.    Social History:  The patient  reports that he has quit smoking. His smoking use included Cigarettes. He has a 2.50 pack-year smoking history. He has never used smokeless tobacco. He reports that he drinks about 2.4 oz of alcohol per week . He reports that he does not  use drugs.   Family History:  The patient's family history includes Cancer in his mother; Heart disease in his mother; Stroke in his mother.    ROS:  Please see the history of present illness.   Otherwise, review of systems are positive for none.   All other systems are reviewed and negative.    PHYSICAL EXAM: VS:  BP 126/64 (BP Location: Left Arm, Patient Position: Sitting, Cuff Size: Normal)   Pulse (!) 53   Ht 6\' 2"  (1.88 m)   Wt 214 lb 9.6 oz (97.3 kg)   BMI 27.55 kg/m  , BMI Body mass index is 27.55 kg/m. GEN: Well nourished, well developed, in no acute distress  HEENT: normal  Neck: no JVD, carotid bruits, or masses Cardiac: RRR; no murmurs, rubs, or gallops,no edema  Respiratory:  clear to auscultation bilaterally, normal work of breathing GI: soft, nontender, nondistended, + BS MS: no deformity or atrophy  Skin: warm and dry, no rash Neuro:  Strength and sensation are intact Psych: euthymic mood, full affect   EKG:  EKG  ordered today. Sinus brady with rate 53. Normal Ecg. I have personally reviewed and interpreted this study.    Recent Labs: 11/23/2015: ALT 24; BUN 12; Creatinine, Ser 0.88; Hemoglobin 15.0; Platelets 147.0; Potassium 4.1; Sodium 141; TSH 1.08    Lipid Panel    Component Value Date/Time   CHOL 162 11/23/2015 0837   TRIG 82.0 11/23/2015 0837   HDL 61.20 11/23/2015 0837   CHOLHDL 3 11/23/2015 0837   VLDL 16.4 11/23/2015 0837   LDLCALC 85 11/23/2015 0837      Wt Readings from Last 3 Encounters:  02/21/16 214 lb 9.6 oz (97.3 kg)  11/28/15 216 lb 4.8 oz (98.1 kg)  07/29/15 217 lb 9.6 oz (98.7 kg)      Other studies Reviewed: Additional studies/ records that were reviewed today include: none.    ASSESSMENT AND PLAN:  1. Coronary disease status [name] CABG in 2005. He is asymptomatic. He had a normal Myoview study September 2015. We will continue risk factor modification. Continue metoprolol and aspirin therapy.   2. Hyperlipidemia. Good  control on statin therapy.  3. Hypertension-well controlled.  4. Atrial flutter s/p ablation. Maintaining NSR.   5. OSA- now on CPAP. Followed by Dr. Radford Pax.    Current medicines are reviewed at length with the patient today.  The patient does not have concerns regarding medicines.  The following changes have been made:  no change  Labs/ tests ordered today include: none  No orders of the defined types were placed in this encounter.    Disposition:   FU with me in 6 months   Signed, [name] Martinique, MD  02/21/2016 9:03 AM    [facility]

## 2016-02-21 ENCOUNTER — Encounter: Payer: Self-pay | Admitting: Cardiology

## 2016-02-21 ENCOUNTER — Ambulatory Visit (INDEPENDENT_AMBULATORY_CARE_PROVIDER_SITE_OTHER): Payer: PPO | Admitting: Cardiology

## 2016-02-21 VITALS — BP 126/64 | HR 53 | Ht 74.0 in | Wt 214.6 lb

## 2016-02-21 DIAGNOSIS — I10 Essential (primary) hypertension: Secondary | ICD-10-CM | POA: Diagnosis not present

## 2016-02-21 DIAGNOSIS — E7800 Pure hypercholesterolemia, unspecified: Secondary | ICD-10-CM | POA: Diagnosis not present

## 2016-02-21 DIAGNOSIS — I483 Typical atrial flutter: Secondary | ICD-10-CM | POA: Diagnosis not present

## 2016-02-21 DIAGNOSIS — I25810 Atherosclerosis of coronary artery bypass graft(s) without angina pectoris: Secondary | ICD-10-CM

## 2016-02-21 DIAGNOSIS — Z951 Presence of aortocoronary bypass graft: Secondary | ICD-10-CM

## 2016-02-21 NOTE — Patient Instructions (Signed)
Continue your current therapy  I will see you in 6 months.   

## 2016-02-26 ENCOUNTER — Other Ambulatory Visit: Payer: Self-pay | Admitting: Cardiology

## 2016-02-27 NOTE — Telephone Encounter (Signed)
Rx(s) sent to pharmacy electronically.  

## 2016-03-19 ENCOUNTER — Other Ambulatory Visit: Payer: Self-pay | Admitting: Family Medicine

## 2016-03-19 NOTE — Telephone Encounter (Signed)
Rx refill sent to pharmacy. 

## 2016-04-10 DIAGNOSIS — F341 Dysthymic disorder: Secondary | ICD-10-CM | POA: Diagnosis not present

## 2016-04-16 DIAGNOSIS — L400 Psoriasis vulgaris: Secondary | ICD-10-CM | POA: Diagnosis not present

## 2016-04-16 DIAGNOSIS — Z85820 Personal history of malignant melanoma of skin: Secondary | ICD-10-CM | POA: Diagnosis not present

## 2016-04-23 DIAGNOSIS — Z79899 Other long term (current) drug therapy: Secondary | ICD-10-CM | POA: Diagnosis not present

## 2016-04-23 DIAGNOSIS — L409 Psoriasis, unspecified: Secondary | ICD-10-CM | POA: Diagnosis not present

## 2016-05-24 DIAGNOSIS — F341 Dysthymic disorder: Secondary | ICD-10-CM | POA: Diagnosis not present

## 2016-05-29 ENCOUNTER — Other Ambulatory Visit: Payer: Self-pay | Admitting: Internal Medicine

## 2016-05-29 DIAGNOSIS — F341 Dysthymic disorder: Secondary | ICD-10-CM | POA: Diagnosis not present

## 2016-05-30 DIAGNOSIS — L400 Psoriasis vulgaris: Secondary | ICD-10-CM | POA: Diagnosis not present

## 2016-07-09 ENCOUNTER — Other Ambulatory Visit: Payer: Self-pay | Admitting: Family Medicine

## 2016-07-17 ENCOUNTER — Encounter: Payer: Self-pay | Admitting: Cardiology

## 2016-07-31 ENCOUNTER — Ambulatory Visit: Payer: PPO | Admitting: Cardiology

## 2016-07-31 NOTE — Progress Notes (Signed)
Cardiology Office Note   Date:  08/02/2016   ID:  [name] [name] [birth year], MRN [id number]  PCP:  [name], MD d Cardiologist:   Cannon Quinton Martinique, MD   Chief Complaint  Patient presents with  . Coronary Artery Disease  . Atrial Flutter      History of Present Illness: [name] [name] is a 66 y.o. male who presents for follow up of atrial flutter and CAD. He is status post CABG in 2005. He had a normal stress echo in May of 2013. In August 2015 he presented with atrial flutter with RVR. He had mildly elevated troponins. He had successful TEE guided DCCV. He then underwent ablation of atrial flutter and ectopic atrial tachycardia on 03/30/14 by Dr. [name].  Stress Myoview September 2015 demonstrated excellent exercise tolerance and normal perfusion. TEE showed normal LV function, mild biatrial enlargement, mild MR,TR,AI. He does have moderate obstructive sleep apnea followed by Dr. Radford Pax and is on CPAP therapy. Today he reports he is doing well. No chest pain or SOB. He is very active going to a [facility] exercise program 3/week at [facility] and also hiking. He feels very well without chest pain, dyspnea, palpitations.   Past Medical History:  Diagnosis Date  . Chicken pox   . Coronary artery disease    Severe three-vessel coronary artery disease with ejection fraction of 65%  . GERD (gastroesophageal reflux disease)   . History of gout   . History of psoriasis   . Hypercholesterolemia   . Hypertension   . OSA (obstructive sleep apnea)    Moderate with AHI of 16.9/hr now on CPAP at 10cm H2O  . Psoriatic arthritis [facility])     Past Surgical History:  Procedure Laterality Date  . ATRIAL FLUTTER ABLATION  03/30/2014  . ATRIAL FLUTTER ABLATION N/A 03/30/2014   Procedure: ATRIAL FLUTTER ABLATION;  Surgeon: Coralyn Mark, MD;  Location: [place] CATH LAB;  Service: Cardiovascular;  Laterality: N/A;  . CARDIAC CATHETERIZATION  09/2003   Ejection fraction was estimated at 65%.  .  CARDIOVERSION N/A 03/15/2014   Procedure: CARDIOVERSION;  Surgeon: Lelon Perla, MD;  Location: [place];  Service: Cardiovascular;  Laterality: N/A;  . CORONARY ARTERY BYPASS GRAFT  09/2003   Lima-lad,svg-diag,svg-om/distal LCX,svg-pda  . TEE WITHOUT CARDIOVERSION N/A 03/15/2014   Procedure: TRANSESOPHAGEAL ECHOCARDIOGRAM (TEE);  Surgeon: Lelon Perla, MD;  Location: [facility] ENDOSCOPY;  Service: Cardiovascular;  Laterality: N/A;  . TONSILLECTOMY  1950's     Current Outpatient Prescriptions  Medication Sig Dispense Refill  . aspirin 81 MG EC tablet Take 1 tablet (81 mg total) by mouth daily.    Marland Kitchen atorvastatin (LIPITOR) 40 MG tablet TAKE 1 TABLET DAILY. 30 tablet 5  . cholecalciferol (VITAMIN D) 1000 UNITS tablet Take 1,000 Units by mouth daily.      . Coenzyme Q10 (CO Q 10 PO) Take 100 mg by mouth daily.     . colchicine 0.6 MG tablet Take 1 tablet (0.6 mg total) by mouth 2 (two) times daily. 30 tablet 5  . metoprolol succinate (TOPROL-XL) 25 MG 24 hr tablet Take 1 tablet (25 mg total) by mouth daily. 90 tablet 3  . multivitamin (THERAGRAN) per tablet Take 1 tablet by mouth daily.      Marland Kitchen NITROSTAT 0.4 MG SL tablet PLACE 1 TABLET UNDER THE TONGUE EVERY 5 MINUTES AS NEEDED FOR CHEST PAIN. 25 tablet 2  . NON FORMULARY Borage Oil as directed    . Omega-3 Fatty  Acids (FISH OIL CONCENTRATE PO) Take 1,700 mg by mouth daily.      . ramipril (ALTACE) 2.5 MG capsule TAKE 1 CAPSULE DAILY. 30 capsule 6  . VIAGRA 100 MG tablet TAKE 1 TABLET EVERY DAY AS NEEDED FOR ERECTILE DYSFUNCTION. 3 tablet 3   No current facility-administered medications for this visit.     Allergies:   Patient has no known allergies.    Social History:  The patient  reports that he has quit smoking. His smoking use included Cigarettes. He has a 2.50 pack-year smoking history. He has never used smokeless tobacco. He reports that he drinks about 2.4 oz of alcohol per week . He reports that he does not use drugs.   Family  History:  The patient's family history includes Cancer in his mother; Heart disease in his mother; Stroke in his mother.    ROS:  Please see the history of present illness.   Otherwise, review of systems are positive for none.   All other systems are reviewed and negative.    PHYSICAL EXAM: VS:  BP 118/64   Pulse 66   Ht 6\' 1"  (1.854 m)   Wt 212 lb (96.2 kg)   SpO2 97%   BMI 27.97 kg/m  , BMI Body mass index is 27.97 kg/m. GEN: Well nourished, well developed, in no acute distress  HEENT: normal  Neck: no JVD, carotid bruits, or masses Cardiac: RRR- few extrasystoles; no murmurs, rubs, or gallops,no edema  Respiratory:  clear to auscultation bilaterally, normal work of breathing GI: soft, nontender, nondistended, + BS MS: no deformity or atrophy  Skin: warm and dry, no rash Neuro:  Strength and sensation are intact Psych: euthymic mood, full affect   EKG:  EKG  Not ordered today.     Recent Labs: 11/23/2015: ALT 24; BUN 12; Creatinine, Ser 0.88; Hemoglobin 15.0; Platelets 147.0; Potassium 4.1; Sodium 141; TSH 1.08    Lipid Panel    Component Value Date/Time   CHOL 162 11/23/2015 0837   TRIG 82.0 11/23/2015 0837   HDL 61.20 11/23/2015 0837   CHOLHDL 3 11/23/2015 0837   VLDL 16.4 11/23/2015 0837   LDLCALC 85 11/23/2015 0837      Wt Readings from Last 3 Encounters:  08/02/16 212 lb (96.2 kg)  02/21/16 214 lb 9.6 oz (97.3 kg)  11/28/15 216 lb 4.8 oz (98.1 kg)      Other studies Reviewed: Additional studies/ records that were reviewed today include: none.    ASSESSMENT AND PLAN:  1. Coronary disease status post CABG in 2005. He is asymptomatic. He had a normal Myoview study September 2015. We will continue risk factor modification. Continue metoprolol and aspirin therapy. Consider follow up stress test in the fall.   2. Hyperlipidemia. Good control on statin therapy.  3. Hypertension-well controlled.  4. Atrial flutter s/p ablation. Maintaining NSR.   5.  OSA- now on CPAP. Followed by Dr. Radford Pax.    Current medicines are reviewed at length with the patient today.  The patient does not have concerns regarding medicines.  The following changes have been made:  no change  Labs/ tests ordered today include: none  No orders of the defined types were placed in this encounter.    Disposition:   FU with me in 6 months   Signed, Kailer Heindel Martinique, MD  08/02/2016 10:20 AM    [place]

## 2016-08-02 ENCOUNTER — Encounter: Payer: Self-pay | Admitting: Cardiology

## 2016-08-02 ENCOUNTER — Ambulatory Visit (INDEPENDENT_AMBULATORY_CARE_PROVIDER_SITE_OTHER): Payer: PPO | Admitting: Cardiology

## 2016-08-02 VITALS — BP 118/64 | HR 66 | Ht 73.0 in | Wt 212.0 lb

## 2016-08-02 DIAGNOSIS — G4733 Obstructive sleep apnea (adult) (pediatric): Secondary | ICD-10-CM

## 2016-08-02 DIAGNOSIS — I483 Typical atrial flutter: Secondary | ICD-10-CM

## 2016-08-02 DIAGNOSIS — Z951 Presence of aortocoronary bypass graft: Secondary | ICD-10-CM

## 2016-08-02 DIAGNOSIS — Z9989 Dependence on other enabling machines and devices: Secondary | ICD-10-CM

## 2016-08-02 DIAGNOSIS — I25810 Atherosclerosis of coronary artery bypass graft(s) without angina pectoris: Secondary | ICD-10-CM

## 2016-08-02 NOTE — Patient Instructions (Addendum)
Continue your current therapy  I will see you in 6 months.   

## 2016-08-06 ENCOUNTER — Ambulatory Visit (INDEPENDENT_AMBULATORY_CARE_PROVIDER_SITE_OTHER): Payer: PPO | Admitting: Cardiology

## 2016-08-06 ENCOUNTER — Encounter: Payer: Self-pay | Admitting: Cardiology

## 2016-08-06 VITALS — BP 122/62 | HR 55 | Ht 73.0 in | Wt 213.0 lb

## 2016-08-06 DIAGNOSIS — G4733 Obstructive sleep apnea (adult) (pediatric): Secondary | ICD-10-CM | POA: Diagnosis not present

## 2016-08-06 DIAGNOSIS — E669 Obesity, unspecified: Secondary | ICD-10-CM | POA: Diagnosis not present

## 2016-08-06 DIAGNOSIS — I10 Essential (primary) hypertension: Secondary | ICD-10-CM | POA: Diagnosis not present

## 2016-08-06 DIAGNOSIS — E663 Overweight: Secondary | ICD-10-CM | POA: Diagnosis not present

## 2016-08-06 NOTE — Patient Instructions (Signed)

## 2016-08-06 NOTE — Progress Notes (Signed)
Cardiology Office Note    Date:  08/06/2016   ID:  [name] [name], DOB Aug 07, 1950, MRN [id number]  PCP:  [name], MD  Cardiologist:  Fransico Him, MD   Chief Complaint  Patient presents with  . Sleep Apnea  . Hypertension    History of Present Illness:  [name] [name] is a 66 y.o. male who presents for followup of OSA. He has  moderate OSA with an AHI of 16.9 events per hour mainly occurring in the non supine position and during NREM sleep. He is on CPAP at 10cm H2O.  He is doing well with his CPAP device. He tolerates the nasasl mask but is not using a chin strap and feels the pressure is adequate. He has no mouth dryness or head congestion on the CPAP. He feels more rested in the am and has no daytime sleepiness.  He does not snore according to his wife.  He works out at [employer] at [employer] 3 times weekly.   Past Medical History:  Diagnosis Date  . Chicken pox   . Coronary artery disease    Severe three-vessel coronary artery disease with ejection fraction of 65%  . GERD (gastroesophageal reflux disease)   . History of gout   . History of psoriasis   . Hypercholesterolemia   . Hypertension   . OSA (obstructive sleep apnea)    Moderate with AHI of 16.9/hr now on CPAP at 10cm H2O  . Psoriatic arthritis [facility])     Past Surgical History:  Procedure Laterality Date  . ATRIAL FLUTTER ABLATION  03/30/2014  . ATRIAL FLUTTER ABLATION N/A 03/30/2014   Procedure: ATRIAL FLUTTER ABLATION;  Surgeon: Coralyn Mark, MD;  Location: [place] CATH LAB;  Service: Cardiovascular;  Laterality: N/A;  . CARDIAC CATHETERIZATION  09/2003   Ejection fraction was estimated at 65%.  . CARDIOVERSION N/A 03/15/2014   Procedure: CARDIOVERSION;  Surgeon: Lelon Perla, MD;  Location: [employer];  Service: Cardiovascular;  Laterality: N/A;  . CORONARY ARTERY BYPASS GRAFT  09/2003   Lima-lad,svg-diag,svg-om/distal LCX,svg-pda  . TEE WITHOUT CARDIOVERSION N/A 03/15/2014   Procedure:  TRANSESOPHAGEAL ECHOCARDIOGRAM (TEE);  Surgeon: Lelon Perla, MD;  Location: [facility] ENDOSCOPY;  Service: Cardiovascular;  Laterality: N/A;  . TONSILLECTOMY  1950's    Current Medications: Outpatient Medications Prior to Visit  Medication Sig Dispense Refill  . aspirin 81 MG EC tablet Take 1 tablet (81 mg total) by mouth daily.    Marland Kitchen atorvastatin (LIPITOR) 40 MG tablet TAKE 1 TABLET DAILY. 30 tablet 5  . cholecalciferol (VITAMIN D) 1000 UNITS tablet Take 1,000 Units by mouth daily.      . Coenzyme Q10 (CO Q 10 PO) Take 100 mg by mouth daily.     . colchicine 0.6 MG tablet Take 1 tablet (0.6 mg total) by mouth 2 (two) times daily. 30 tablet 5  . metoprolol succinate (TOPROL-XL) 25 MG 24 hr tablet Take 1 tablet (25 mg total) by mouth daily. 90 tablet 3  . multivitamin (THERAGRAN) per tablet Take 1 tablet by mouth daily.      Marland Kitchen NITROSTAT 0.4 MG SL tablet PLACE 1 TABLET UNDER THE TONGUE EVERY 5 MINUTES AS NEEDED FOR CHEST PAIN. 25 tablet 2  . NON FORMULARY Borage Oil as directed    . Omega-3 Fatty Acids (FISH OIL CONCENTRATE PO) Take 1,700 mg by mouth daily.      . ramipril (ALTACE) 2.5 MG capsule TAKE 1 CAPSULE DAILY. 30 capsule 6  . VIAGRA  100 MG tablet TAKE 1 TABLET EVERY DAY AS NEEDED FOR ERECTILE DYSFUNCTION. 3 tablet 3   No facility-administered medications prior to visit.      Allergies:   Patient has no known allergies.   Social History   Social History  . Marital status: Married    Spouse name: N/A  . Number of children: 1  . Years of education: N/A   Occupational History  . financial  [employer]   Social History Main Topics  . Smoking status: Former Smoker    Packs/day: 0.50    Years: 5.00    Types: Cigarettes  . Smokeless tobacco: Never Used     Comment: "quit smoking cigarettes in the late 1970's"  . Alcohol use 2.4 oz/week    2 Cans of beer, 2 Shots of liquor per week  . Drug use: No  . Sexual activity: Yes   Other Topics Concern  . None    Social History Narrative  . None     Family History:  The patient's family history includes Cancer in his mother; Heart disease in his mother; Stroke in his mother.   ROS:   Please see the history of present illness.    ROS All other systems reviewed and are negative.  No flowsheet data found.     PHYSICAL EXAM:   VS:  BP 122/62   Pulse (!) 55   Ht 6\' 1"  (1.854 m)   Wt 213 lb (96.6 kg)   SpO2 98%   BMI 28.10 kg/m    GEN: Well nourished, well developed, in no acute distress  HEENT: normal  Neck: no JVD, carotid bruits, or masses Cardiac: RRR; no murmurs, rubs, or gallops,no edema.  Intact distal pulses bilaterally.  Respiratory:  clear to auscultation bilaterally, normal work of breathing GI: soft, nontender, nondistended, + BS MS: no deformity or atrophy  Skin: warm and dry, no rash Neuro:  Alert and Oriented x 3, Strength and sensation are intact Psych: euthymic mood, full affect  Wt Readings from Last 3 Encounters:  08/06/16 213 lb (96.6 kg)  08/02/16 212 lb (96.2 kg)  02/21/16 214 lb 9.6 oz (97.3 kg)      Studies/Labs Reviewed:   EKG:  EKG is not ordered today.    Recent Labs: 11/23/2015: ALT 24; BUN 12; Creatinine, Ser 0.88; Hemoglobin 15.0; Platelets 147.0; Potassium 4.1; Sodium 141; TSH 1.08   Lipid Panel    Component Value Date/Time   CHOL 162 11/23/2015 0837   TRIG 82.0 11/23/2015 0837   HDL 61.20 11/23/2015 0837   CHOLHDL 3 11/23/2015 0837   VLDL 16.4 11/23/2015 0837   LDLCALC 85 11/23/2015 0837    Additional studies/ records that were reviewed today include:  CPAP download    ASSESSMENT:    1. OSA (obstructive sleep apnea)   2. Essential hypertension   3. Obesity (BMI 30-39.9)   4. Overweight (BMI 25.0-29.9)      PLAN:  In order of problems listed above:  OSA - the patient is tolerating PAP therapy well without any problems. The PAP download was reviewed today and showed an AHI of 1.8/hr on 10 cm H2O with 100% compliance in using  more than 4 hours nightly.  The patient has been using and benefiting from CPAP use and will continue to benefit from therapy.  HTN - BP controlled on current meds.  Continue ACE I. Obesity - I have encouraged him to continue on his current exercise program and cut back on  carbs and portions.    Medication Adjustments/Labs and Tests Ordered: Current medicines are reviewed at length with the patient today.  Concerns regarding medicines are outlined above.  Medication changes, Labs and Tests ordered today are listed in the Patient Instructions below.  There are no Patient Instructions on file for this visit.   Signed, Fransico Him, MD  08/06/2016 9:21 AM    [address], [place], [place]  [address] Phone: [phone]; Fax: [phone]

## 2016-08-07 ENCOUNTER — Telehealth: Payer: Self-pay | Admitting: *Deleted

## 2016-08-07 NOTE — Telephone Encounter (Signed)
Called the patient and told him the result of his download had good compliance, he verbalized understanding

## 2016-08-08 ENCOUNTER — Other Ambulatory Visit: Payer: Self-pay | Admitting: Cardiology

## 2016-08-10 DIAGNOSIS — F341 Dysthymic disorder: Secondary | ICD-10-CM | POA: Diagnosis not present

## 2016-08-10 NOTE — Telephone Encounter (Signed)
Rx has been sent to the pharmacy electronically. ° °

## 2016-08-31 ENCOUNTER — Encounter: Payer: Self-pay | Admitting: Family Medicine

## 2016-08-31 ENCOUNTER — Ambulatory Visit (INDEPENDENT_AMBULATORY_CARE_PROVIDER_SITE_OTHER): Payer: PPO | Admitting: Family Medicine

## 2016-08-31 VITALS — BP 150/70 | HR 69 | Temp 98.1°F | Ht 73.0 in | Wt 214.9 lb

## 2016-08-31 DIAGNOSIS — R05 Cough: Secondary | ICD-10-CM

## 2016-08-31 DIAGNOSIS — N529 Male erectile dysfunction, unspecified: Secondary | ICD-10-CM

## 2016-08-31 DIAGNOSIS — R059 Cough, unspecified: Secondary | ICD-10-CM

## 2016-08-31 MED ORDER — SILDENAFIL CITRATE 20 MG PO TABS: ORAL_TABLET | ORAL | 3 refills | Status: DC

## 2016-08-31 MED ORDER — BENZONATATE 200 MG PO CAPS: 200.0000 mg | ORAL_CAPSULE | Freq: Three times a day (TID) | ORAL | 0 refills | Status: DC | PRN

## 2016-08-31 NOTE — Patient Instructions (Signed)
Consider night-time use of OTC Chlorpheniramine.

## 2016-08-31 NOTE — Progress Notes (Signed)
Pre visit review using our clinic review tool, if applicable. No additional management support is needed unless otherwise documented below in the visit note. 

## 2016-08-31 NOTE — Progress Notes (Signed)
Subjective:     Patient ID: [name] [name], male   DOB: 1951-06-01, 66 y.o.   MRN: [id number]  HPI Patient seen for the following issues  Approximately 3 week history of cough. Patient is nonsmoker. Cough is mostly dry. Able to sleep at night. No fevers or chills. No dyspnea. No GERD symptoms. He does have some frequent postnasal drip symptoms. No facial pain. Denies any exertional dyspnea. Still exercising okay.  Patient has history of erectile dysfunction. He does have history of CAD but is not taking nitroglycerin. He has taken Viagra 100 mg in the past and is requesting generic Viagra. No recent chest pains.  Past Medical History:  Diagnosis Date  . Chicken pox   . Coronary artery disease    Severe three-vessel coronary artery disease with ejection fraction of 65%  . GERD (gastroesophageal reflux disease)   . History of gout   . History of psoriasis   . Hypercholesterolemia   . Hypertension   . OSA (obstructive sleep apnea)    Moderate with AHI of 16.9/hr now on CPAP at 10cm H2O  . Psoriatic arthritis [facility])    Past Surgical History:  Procedure Laterality Date  . ATRIAL FLUTTER ABLATION  03/30/2014  . ATRIAL FLUTTER ABLATION N/A 03/30/2014   Procedure: ATRIAL FLUTTER ABLATION;  Surgeon: Coralyn Mark, MD;  Location: [place] CATH LAB;  Service: Cardiovascular;  Laterality: N/A;  . CARDIAC CATHETERIZATION  09/2003   Ejection fraction was estimated at 65%.  . CARDIOVERSION N/A 03/15/2014   Procedure: CARDIOVERSION;  Surgeon: Lelon Perla, MD;  Location: [place];  Service: Cardiovascular;  Laterality: N/A;  . CORONARY ARTERY BYPASS GRAFT  09/2003   Lima-lad,svg-diag,svg-om/distal LCX,svg-pda  . TEE WITHOUT CARDIOVERSION N/A 03/15/2014   Procedure: TRANSESOPHAGEAL ECHOCARDIOGRAM (TEE);  Surgeon: Lelon Perla, MD;  Location: [facility] ENDOSCOPY;  Service: Cardiovascular;  Laterality: N/A;  . TONSILLECTOMY  1950's    reports that he has quit smoking. His smoking use included Cigarettes.  He has a 2.50 pack-year smoking history. He has never used smokeless tobacco. He reports that he drinks about 2.4 oz of alcohol per week . He reports that he does not use drugs. family history includes Cancer in his mother; Heart disease in his mother; Stroke in his mother. No Known Allergies   Review of Systems  Constitutional: Negative for fatigue and unexpected weight change.  HENT: Positive for postnasal drip. Negative for congestion, sinus pain and sinus pressure.   Eyes: Negative for visual disturbance.  Respiratory: Positive for cough. Negative for chest tightness and shortness of breath.   Cardiovascular: Negative for chest pain, palpitations and leg swelling.  Endocrine: Negative for polydipsia and polyuria.  Neurological: Negative for dizziness, syncope, weakness, light-headedness and headaches.       Objective:   Physical Exam  Constitutional: He is oriented to person, place, and time. He appears well-developed and well-nourished.  HENT:  Right Ear: External ear normal.  Left Ear: External ear normal.  Mouth/Throat: Oropharynx is clear and moist.  Eyes: Pupils are equal, round, and reactive to light.  Neck: Neck supple. No thyromegaly present.  Cardiovascular: Normal rate and regular rhythm.   Pulmonary/Chest: Effort normal and breath sounds normal. No respiratory distress. He has no wheezes. He has no rales.  Musculoskeletal: He exhibits no edema.  Neurological: He is alert and oriented to person, place, and time.       Assessment:     #1 dry cough. Suspect post viral bronchitis related. Nonfocal exam. Possible  postnasal drip component  #2 erectile dysfunction    Plan:     -Try over-the-counter chlorpheniramine 4 mg daily at bedtime -Tessalon Perles 200 mg every 8 hours as needed for cough -Follow-up immediately for any fever, shortness of breath, hemoptysis, or other changes. Also follow-up if cough not resolving the next 2 weeks -Sildenafil 20 mg 2-5 tablets 1  hour prior to sexual activity. He knows not to mix this with nitroglycerin  [name] MD [facility] [place]

## 2016-09-05 ENCOUNTER — Ambulatory Visit: Payer: PPO | Admitting: Cardiology

## 2016-09-25 ENCOUNTER — Telehealth: Payer: Self-pay | Admitting: Family Medicine

## 2016-09-25 NOTE — Telephone Encounter (Signed)
Error

## 2016-10-04 DIAGNOSIS — F341 Dysthymic disorder: Secondary | ICD-10-CM | POA: Diagnosis not present

## 2016-11-12 ENCOUNTER — Other Ambulatory Visit: Payer: Self-pay | Admitting: Family Medicine

## 2016-11-22 DIAGNOSIS — Z85820 Personal history of malignant melanoma of skin: Secondary | ICD-10-CM | POA: Diagnosis not present

## 2016-11-22 DIAGNOSIS — L821 Other seborrheic keratosis: Secondary | ICD-10-CM | POA: Diagnosis not present

## 2016-11-22 DIAGNOSIS — D225 Melanocytic nevi of trunk: Secondary | ICD-10-CM | POA: Diagnosis not present

## 2016-11-22 DIAGNOSIS — L814 Other melanin hyperpigmentation: Secondary | ICD-10-CM | POA: Diagnosis not present

## 2016-11-22 DIAGNOSIS — D1800 Hemangioma unspecified site: Secondary | ICD-10-CM | POA: Diagnosis not present

## 2016-11-22 DIAGNOSIS — L400 Psoriasis vulgaris: Secondary | ICD-10-CM | POA: Diagnosis not present

## 2016-12-27 DIAGNOSIS — L400 Psoriasis vulgaris: Secondary | ICD-10-CM | POA: Diagnosis not present

## 2016-12-27 DIAGNOSIS — H5203 Hypermetropia, bilateral: Secondary | ICD-10-CM | POA: Diagnosis not present

## 2016-12-27 DIAGNOSIS — Z79899 Other long term (current) drug therapy: Secondary | ICD-10-CM | POA: Diagnosis not present

## 2016-12-31 ENCOUNTER — Other Ambulatory Visit: Payer: Self-pay | Admitting: Cardiology

## 2017-01-24 ENCOUNTER — Other Ambulatory Visit: Payer: Self-pay | Admitting: Cardiology

## 2017-02-07 NOTE — Progress Notes (Signed)
Cardiology Office Note   Date:  02/14/2017   ID:  [name] [name], [name] [name] 13-Jun-1951, MRN [id number]  PCP:  [name], MD  Cardiologist:   [name], MD   Chief Complaint  Patient presents with  . Coronary Artery Disease      History of Present Illness: [name] [name] is a 66 y.o. male who presents for follow up of atrial flutter and CAD. He is status post CABG in 2005. He had a normal stress echo in May of 2013. In August 2015 he presented with atrial flutter with RVR. He had mildly elevated troponins. He had successful TEE guided DCCV. He then underwent ablation of atrial flutter and ectopic atrial tachycardia on 03/30/14 by Dr. [name].  Stress Myoview September 2015 demonstrated excellent exercise tolerance and normal perfusion. TEE showed normal LV function, mild biatrial enlargement, mild MR,TR,AI. He does have moderate obstructive sleep apnea followed by Dr. Radford Pax and is on CPAP therapy.  Today he reports he is doing well. No chest pain or SOB. He exercises vigorously 3x/week.  He does note some pitting edema in his ankles at the end of the day that are down in the morning. He has an intermittent cough.    Past Medical History:  Diagnosis Date  . Chicken pox   . Coronary artery disease    Severe three-vessel coronary artery disease with ejection fraction of 65%  . GERD (gastroesophageal reflux disease)   . History of gout   . History of psoriasis   . Hypercholesterolemia   . Hypertension   . OSA (obstructive sleep apnea)    Moderate with AHI of 16.9/hr now on CPAP at 10cm H2O  . Psoriatic arthritis [facility])     Past Surgical History:  Procedure Laterality Date  . ATRIAL FLUTTER ABLATION  03/30/2014  . ATRIAL FLUTTER ABLATION N/A 03/30/2014   Procedure: ATRIAL FLUTTER ABLATION;  Surgeon: Coralyn Mark, MD;  Location: [place] CATH LAB;  Service: Cardiovascular;  Laterality: N/A;  . CARDIAC CATHETERIZATION  09/2003   Ejection fraction was estimated at 65%.  .  CARDIOVERSION N/A 03/15/2014   Procedure: CARDIOVERSION;  Surgeon: Lelon Perla, MD;  Location: [place];  Service: Cardiovascular;  Laterality: N/A;  . CORONARY ARTERY BYPASS GRAFT  09/2003   Lima-lad,svg-diag,svg-om/distal LCX,svg-pda  . TEE WITHOUT CARDIOVERSION N/A 03/15/2014   Procedure: TRANSESOPHAGEAL ECHOCARDIOGRAM (TEE);  Surgeon: Lelon Perla, MD;  Location: [facility] ENDOSCOPY;  Service: Cardiovascular;  Laterality: N/A;  . TONSILLECTOMY  1950's     Current Outpatient Prescriptions  Medication Sig Dispense Refill  . aspirin 81 MG EC tablet Take 1 tablet (81 mg total) by mouth daily.    Marland Kitchen atorvastatin (LIPITOR) 40 MG tablet TAKE 1 TABLET DAILY. 30 tablet 0  . cholecalciferol (VITAMIN D) 1000 UNITS tablet Take 1,000 Units by mouth daily.      . Coenzyme Q10 (CO Q 10 PO) Take 100 mg by mouth daily.     . colchicine 0.6 MG tablet TAKE 1 TABLET TWICE DAILY. 90 tablet 1  . metoprolol succinate (TOPROL-XL) 25 MG 24 hr tablet Take 1 tablet (25 mg total) by mouth daily. 90 tablet 3  . multivitamin (THERAGRAN) per tablet Take 1 tablet by mouth daily.      . nitroGLYCERIN (NITROSTAT) 0.4 MG SL tablet PLACE 1 TABLET UNDER THE TONGUE EVERY 5 MINUTES AS NEEDED FOR CHEST PAIN. 25 tablet 1  . NON FORMULARY Borage Oil as directed    . Omega-3 Fatty Acids (  FISH OIL CONCENTRATE PO) Take 1,700 mg by mouth daily.      . sildenafil (REVATIO) 20 MG tablet Take 2 to 5 tablets one hour prior to sexual activity 50 tablet 3  . losartan (COZAAR) 50 MG tablet Take 1 tablet (50 mg total) by mouth daily. 90 tablet 3   No current facility-administered medications for this visit.     Allergies:   Patient has no known allergies.    Social History:  The patient  reports that he has quit smoking. His smoking use included Cigarettes. He has a 2.50 pack-year smoking history. He has never used smokeless tobacco. He reports that he drinks about 2.4 oz of alcohol per week . He reports that he does not use drugs.     Family History:  The patient's family history includes Cancer in his mother; Heart disease in his mother; Stroke in his mother.    ROS:  Please see the history of present illness.   Otherwise, review of systems are positive for none.   All other systems are reviewed and negative.    PHYSICAL EXAM: VS:  BP (!) 154/80   Pulse 64   Ht 6\' 1"  (1.854 m)   Wt 209 lb 6.4 oz (95 kg)   BMI 27.63 kg/m  , BMI Body mass index is 27.63 kg/m. GEN: Well nourished, well developed, in no acute distress  HEENT: normal  Neck: no JVD, carotid bruits, or masses Cardiac: RRR- few extrasystoles; no murmurs, rubs, or gallops,no edema  Respiratory:  clear to auscultation bilaterally, normal work of breathing GI: soft, nontender, nondistended, + BS MS: no deformity or atrophy  LE: no edema. Varicosities. Pulses 2+ Neuro:  Strength and sensation are intact Psych: euthymic mood, full affect   EKG:  EKG  Today shows NSR with PACs. Otherwise normal. I have personally reviewed and interpreted this study.   Recent Labs: No results found for requested labs within last 8760 hours.    Lipid Panel    Component Value Date/Time   CHOL 162 11/23/2015 0837   TRIG 82.0 11/23/2015 0837   HDL 61.20 11/23/2015 0837   CHOLHDL 3 11/23/2015 0837   VLDL 16.4 11/23/2015 0837   LDLCALC 85 11/23/2015 0837      Wt Readings from Last 3 Encounters:  02/14/17 209 lb 6.4 oz (95 kg)  02/11/17 212 lb 4.8 oz (96.3 kg)  08/31/16 214 lb 14.4 oz (97.5 kg)      Other studies Reviewed: Additional studies/ records that were reviewed today include: none.    ASSESSMENT AND PLAN:  1. Coronary disease status post CABG in 2005. He is asymptomatic. He had a normal Myoview study September 2015. We will continue risk factor modification. Continue metoprolol and aspirin therapy. I have recommended a follow up ETT.   2. Hyperlipidemia. On statin therapy- will check fasting lab work today.   3. Hypertension-BP elevated today  but normal last week. I am concerned that his cough may be related to ramipril. Will discontinue this and switch to losartan 50 mg daily.  4. Atrial flutter s/p ablation. Maintaining NSR.   5. OSA- now on CPAP. Followed by Dr. Radford Pax.  6. Edema. I suspect this is related to venous insufficiency with exam showing varicosities. Recommend conservative measures with sodium restriction and elevation when possible. If no improvement will move to support hose or consider a diuretic. Will check a BNP level. Prior cardiac function has been normal.      Disposition:   FU with  me in 6 months   Signed, [name], MD  02/14/2017 8:39 AM    [facility]

## 2017-02-10 ENCOUNTER — Other Ambulatory Visit: Payer: Self-pay | Admitting: Cardiology

## 2017-02-11 ENCOUNTER — Encounter: Payer: Self-pay | Admitting: Family Medicine

## 2017-02-11 ENCOUNTER — Ambulatory Visit (INDEPENDENT_AMBULATORY_CARE_PROVIDER_SITE_OTHER): Payer: PPO | Admitting: Family Medicine

## 2017-02-11 VITALS — BP 120/80 | HR 64 | Temp 98.3°F | Wt 212.3 lb

## 2017-02-11 DIAGNOSIS — R0982 Postnasal drip: Secondary | ICD-10-CM

## 2017-02-11 DIAGNOSIS — M1A9XX0 Chronic gout, unspecified, without tophus (tophi): Secondary | ICD-10-CM | POA: Diagnosis not present

## 2017-02-11 DIAGNOSIS — R05 Cough: Secondary | ICD-10-CM | POA: Diagnosis not present

## 2017-02-11 DIAGNOSIS — M25531 Pain in right wrist: Secondary | ICD-10-CM | POA: Diagnosis not present

## 2017-02-11 DIAGNOSIS — R059 Cough, unspecified: Secondary | ICD-10-CM

## 2017-02-11 NOTE — Patient Instructions (Signed)
Try icing right wrist 20 minutes 2-3 times per day. Try the wrist splint for the next couple of weeks.

## 2017-02-11 NOTE — Progress Notes (Signed)
Subjective:     Patient ID: [name] [name], male   DOB: [birth year], 66 y.o.   MRN: [id number]  HPI Patient is seen to discuss several issues as follows  Right wrist pain for about 2 months. Right-hand dominant. Denies injury. Pain is along the ulnar aspect of the wrist. No visible swelling. No ecchymosis. No warmth or erythema. He tried some ibuprofen which has helped. Had difficulty recently with things like pushups. Starting to limit weight lifting.  Intermittent postnasal drip related symptoms. Associated frequently with cough. Not aware of any obvious reflux symptoms. Cough is very intermittent. Possibly worse at night. No hemoptysis. No dyspnea. No appetite or weight changes.  Patient has history of psoriasis. Followed by dermatology. They're looking at starting methotrexate soon. He has frequent arthralgias and also probable history of gout. He states his knee was tapped once before and showed "crystals ". Intermittent pain involving some digits of the hand. He felt this may been gout related. Tries to avoid regular nonsteroidals with history of CAD. Takes Colchicine intermittently for flareups.  Past Medical History:  Diagnosis Date  . Chicken pox   . Coronary artery disease    Severe three-vessel coronary artery disease with ejection fraction of 65%  . GERD (gastroesophageal reflux disease)   . History of gout   . History of psoriasis   . Hypercholesterolemia   . Hypertension   . OSA (obstructive sleep apnea)    Moderate with AHI of 16.9/hr now on CPAP at 10cm H2O  . Psoriatic arthritis [facility])    Past Surgical History:  Procedure Laterality Date  . ATRIAL FLUTTER ABLATION  03/30/2014  . ATRIAL FLUTTER ABLATION N/A 03/30/2014   Procedure: ATRIAL FLUTTER ABLATION;  Surgeon: Coralyn Mark, MD;  Location: [place] CATH LAB;  Service: Cardiovascular;  Laterality: N/A;  . CARDIAC CATHETERIZATION  09/2003   Ejection fraction was estimated at 65%.  . CARDIOVERSION N/A 03/15/2014   Procedure:  CARDIOVERSION;  Surgeon: Lelon Perla, MD;  Location: [place];  Service: Cardiovascular;  Laterality: N/A;  . CORONARY ARTERY BYPASS GRAFT  09/2003   Lima-lad,svg-diag,svg-om/distal LCX,svg-pda  . TEE WITHOUT CARDIOVERSION N/A 03/15/2014   Procedure: TRANSESOPHAGEAL ECHOCARDIOGRAM (TEE);  Surgeon: Lelon Perla, MD;  Location: [facility] ENDOSCOPY;  Service: Cardiovascular;  Laterality: N/A;  . TONSILLECTOMY  1950's    reports that he has quit smoking. His smoking use included Cigarettes. He has a 2.50 pack-year smoking history. He has never used smokeless tobacco. He reports that he drinks about 2.4 oz of alcohol per week . He reports that he does not use drugs. family history includes Cancer in his mother; Heart disease in his mother; Stroke in his mother. No Known Allergies   Review of Systems  Constitutional: Negative for appetite change, fatigue and unexpected weight change.  Eyes: Negative for visual disturbance.  Respiratory: Positive for cough. Negative for chest tightness and shortness of breath.   Cardiovascular: Negative for chest pain, palpitations and leg swelling.  Musculoskeletal: Positive for arthralgias.  Skin: Positive for rash.  Neurological: Negative for dizziness, syncope, weakness, light-headedness and headaches.       Objective:   Physical Exam  Constitutional: He appears well-developed and well-nourished.  Cardiovascular: Normal rate and regular rhythm.   Pulmonary/Chest: Effort normal and breath sounds normal. No respiratory distress. He has no wheezes. He has no rales.  Musculoskeletal:  Right wrist full range of motion. No bony tenderness. Minimal pain with deviation radially of the wrist over the distal ulnar region. No  warmth. No erythema. No wrist effusion  Skin: Rash noted.       Assessment:     #1 right wrist pain. Suspect either ligament strain or tendinitis  #2 intermittent cough possibly postnasal drip related.  ? Component of GERD.  #3  gout    Plan:     -Short arm wrist brace supplied to use intermittently -Try icing 15-20 minutes 2-3 times daily -Limited use of ibuprofen with cardiac history -Touch base if wrist not improving in 3-4 weeks -Discussed possible prophylaxis for gout but at this point is not interested. -Consider over-the-counter chlorpheniramine at night and Flonase for postnasal drip symptoms -consider PPI if cough not relieved with the above.  [name] MD [facility]

## 2017-02-13 ENCOUNTER — Telehealth: Payer: Self-pay | Admitting: Family Medicine

## 2017-02-13 DIAGNOSIS — L4050 Arthropathic psoriasis, unspecified: Secondary | ICD-10-CM

## 2017-02-13 NOTE — Telephone Encounter (Signed)
Referral placed.

## 2017-02-13 NOTE — Telephone Encounter (Signed)
Dr Charlestine Night is actually retiring.  Let's set up to see Dr Amil Amen or Trudie Reed.

## 2017-02-13 NOTE — Telephone Encounter (Signed)
Pt would like to proceed with referral to rheumatology. Pt had seen Dr Charlestine Night years ago and does NOT want to go back there.

## 2017-02-14 ENCOUNTER — Ambulatory Visit (INDEPENDENT_AMBULATORY_CARE_PROVIDER_SITE_OTHER): Payer: PPO | Admitting: Cardiology

## 2017-02-14 ENCOUNTER — Encounter: Payer: Self-pay | Admitting: Cardiology

## 2017-02-14 VITALS — BP 154/80 | HR 64 | Ht 73.0 in | Wt 209.4 lb

## 2017-02-14 DIAGNOSIS — Z951 Presence of aortocoronary bypass graft: Secondary | ICD-10-CM | POA: Diagnosis not present

## 2017-02-14 DIAGNOSIS — G4733 Obstructive sleep apnea (adult) (pediatric): Secondary | ICD-10-CM | POA: Diagnosis not present

## 2017-02-14 DIAGNOSIS — I25810 Atherosclerosis of coronary artery bypass graft(s) without angina pectoris: Secondary | ICD-10-CM

## 2017-02-14 DIAGNOSIS — I10 Essential (primary) hypertension: Secondary | ICD-10-CM

## 2017-02-14 DIAGNOSIS — Z9989 Dependence on other enabling machines and devices: Secondary | ICD-10-CM | POA: Diagnosis not present

## 2017-02-14 DIAGNOSIS — I483 Typical atrial flutter: Secondary | ICD-10-CM

## 2017-02-14 MED ORDER — LOSARTAN POTASSIUM 50 MG PO TABS: 50.0000 mg | ORAL_TABLET | Freq: Every day | ORAL | 3 refills | Status: DC

## 2017-02-14 NOTE — Patient Instructions (Addendum)
Stop ramipril   Start losartan 50 mg daily  Avoid salt  We will schedule you for a stress test ( POET )  We will check blood work today

## 2017-02-15 LAB — CBC WITH DIFFERENTIAL/PLATELET
BASOS ABS: 0 10*3/uL (ref 0.0–0.2)
Basos: 0 %
EOS (ABSOLUTE): 0.2 10*3/uL (ref 0.0–0.4)
Eos: 3 %
HEMOGLOBIN: 16.4 g/dL (ref 13.0–17.7)
Hematocrit: 47.3 % (ref 37.5–51.0)
IMMATURE GRANS (ABS): 0 10*3/uL (ref 0.0–0.1)
Immature Granulocytes: 0 %
LYMPHS: 19 %
Lymphocytes Absolute: 1.1 10*3/uL (ref 0.7–3.1)
MCH: 31.8 pg (ref 26.6–33.0)
MCHC: 34.7 g/dL (ref 31.5–35.7)
MCV: 92 fL (ref 79–97)
MONOCYTES: 14 %
Monocytes Absolute: 0.8 10*3/uL (ref 0.1–0.9)
Neutrophils Absolute: 3.8 10*3/uL (ref 1.4–7.0)
Neutrophils: 64 %
Platelets: 169 10*3/uL (ref 150–379)
RBC: 5.16 x10E6/uL (ref 4.14–5.80)
RDW: 12.9 % (ref 12.3–15.4)
WBC: 5.9 10*3/uL (ref 3.4–10.8)

## 2017-02-15 LAB — BASIC METABOLIC PANEL
BUN/Creatinine Ratio: 11 (ref 10–24)
BUN: 10 mg/dL (ref 8–27)
CO2: 23 mmol/L (ref 20–29)
CREATININE: 0.94 mg/dL (ref 0.76–1.27)
Calcium: 10.2 mg/dL (ref 8.6–10.2)
Chloride: 101 mmol/L (ref 96–106)
GFR, EST AFRICAN AMERICAN: 96 mL/min/{1.73_m2} (ref >59)
GFR, EST NON AFRICAN AMERICAN: 83 mL/min/{1.73_m2} (ref >59)
Glucose: 96 mg/dL (ref 65–99)
Potassium: 4.3 mmol/L (ref 3.5–5.2)
SODIUM: 139 mmol/L (ref 134–144)

## 2017-02-15 LAB — HEPATIC FUNCTION PANEL
ALT: 27 IU/L (ref 0–44)
AST: 26 IU/L (ref 0–40)
Albumin: 4.6 g/dL (ref 3.6–4.8)
Alkaline Phosphatase: 85 IU/L (ref 39–117)
BILIRUBIN TOTAL: 1 mg/dL (ref 0.0–1.2)
Bilirubin, Direct: 0.24 mg/dL (ref 0.00–0.40)
Total Protein: 6.8 g/dL (ref 6.0–8.5)

## 2017-02-15 LAB — BRAIN NATRIURETIC PEPTIDE: BNP: 151.3 pg/mL — AB (ref 0.0–100.0)

## 2017-02-15 LAB — LIPID PANEL
CHOL/HDL RATIO: 2.2 ratio (ref 0.0–5.0)
Cholesterol, Total: 153 mg/dL (ref 100–199)
HDL: 71 mg/dL (ref >39)
LDL CALC: 69 mg/dL (ref 0–99)
TRIGLYCERIDES: 65 mg/dL (ref 0–149)
VLDL Cholesterol Cal: 13 mg/dL (ref 5–40)

## 2017-02-21 ENCOUNTER — Telehealth: Payer: Self-pay | Admitting: Cardiology

## 2017-02-21 NOTE — Telephone Encounter (Signed)
Returned call to patient.Stated he wanted to know which medication he was suppose to stop taking.Advised stop taking ramipril and start losartan 50 mg daily.

## 2017-02-21 NOTE — Telephone Encounter (Signed)
New Message  Pt call requesting to speak with RN about some of his medications. Please call back to discuss

## 2017-03-01 ENCOUNTER — Telehealth (HOSPITAL_COMMUNITY): Payer: Self-pay

## 2017-03-01 NOTE — Telephone Encounter (Signed)
Encounter complete. 

## 2017-03-06 ENCOUNTER — Ambulatory Visit (HOSPITAL_COMMUNITY)
Admission: RE | Admit: 2017-03-06 | Discharge: 2017-03-06 | Disposition: A | Discharge status: Home / Self Care | Payer: PPO | Source: Ambulatory Visit | Attending: Cardiovascular Disease | Admitting: Cardiovascular Disease

## 2017-03-06 DIAGNOSIS — I25810 Atherosclerosis of coronary artery bypass graft(s) without angina pectoris: Secondary | ICD-10-CM | POA: Insufficient documentation

## 2017-03-06 DIAGNOSIS — Z9989 Dependence on other enabling machines and devices: Secondary | ICD-10-CM

## 2017-03-06 DIAGNOSIS — I10 Essential (primary) hypertension: Secondary | ICD-10-CM | POA: Insufficient documentation

## 2017-03-06 DIAGNOSIS — Z951 Presence of aortocoronary bypass graft: Secondary | ICD-10-CM

## 2017-03-06 DIAGNOSIS — I483 Typical atrial flutter: Secondary | ICD-10-CM | POA: Insufficient documentation

## 2017-03-06 DIAGNOSIS — G4733 Obstructive sleep apnea (adult) (pediatric): Secondary | ICD-10-CM | POA: Insufficient documentation

## 2017-03-06 LAB — EXERCISE TOLERANCE TEST
CHL CUP MPHR: 152 {beats}/min
CHL CUP RESTING HR STRESS: 59 {beats}/min
CSEPHR: 99 %
Estimated workload: 11.2 METS
Exercise duration (min): 9 min
Exercise duration (sec): 42 s
Peak HR: 151 {beats}/min
RPE: 17

## 2017-03-14 ENCOUNTER — Ambulatory Visit (INDEPENDENT_AMBULATORY_CARE_PROVIDER_SITE_OTHER): Payer: PPO

## 2017-03-14 VITALS — BP 120/80 | HR 96 | Ht 73.0 in | Wt 215.0 lb

## 2017-03-14 DIAGNOSIS — Z1159 Encounter for screening for other viral diseases: Secondary | ICD-10-CM | POA: Diagnosis not present

## 2017-03-14 DIAGNOSIS — Z0000 Encounter for general adult medical examination without abnormal findings: Secondary | ICD-10-CM | POA: Diagnosis not present

## 2017-03-14 NOTE — Patient Instructions (Addendum)
[name] [name] , Thank you for taking time to come for your Medicare Wellness Visit. I appreciate your ongoing commitment to your health goals. Please review the following plan we discussed and let me know if I can assist you in the future.    The Centers for Disease Control are now recommending 2 pneumonia vaccinations after 63. The first is the Prevnar 13. This helps to boost your immunity to community acquired pneumonia as well as some protection from bacterial pneumonia  The 2nd is the pneumovax 23, which offers more broad protection!  Please consider taking these as this is your best protection against pneumonia.  Your PSV 23 Is due  Keep in mind the flu shot is an inactivated vaccine and takes at least 2 weeks to build immunity. The flu virus can be dormant for 4 days prior to symptoms Taking the flu shot at the beginning of the season can reduce the risk for the entire community.   Shingrix is a vaccine for the prevention of Shingles in Adults 50 and older.  If you are on Medicare, you can request a prescription from your doctor to be filled at a pharmacy.  Please check with your benefits regarding applicable copays or out of pocket expenses.  The Shingrix is given in 2 vaccines approx 8 weeks apart. You must receive the 2nd dose prior to 6 months from receipt of the first.     These are the goals we discussed: Goals    . Weight (lb) < 200 lb (90.7 kg)          Plans to change eating habits Slow down on eating out at lunch  One larger and one smaller a day  Can use http://vang.com/       This is a list of the screening recommended for you and due dates:  Health Maintenance  Topic Date Due  .  Hepatitis C: One time screening is recommended by Center for Disease Control  (CDC) for  adults born from [birth year] through 1967.   06/28/1951  . Pneumonia vaccines (2 of 2 - PPSV23) 08/20/2014  . Flu Shot  02/20/2017  . Tetanus Vaccine  07/23/2018  . Colon Cancer Screening  11/06/2021    Prevention of falls: Remove rugs or any tripping hazards in the home Use Non slip mats in bathtubs and showers Placing grab bars next to the toilet and or shower Placing handrails on both sides of the stair way Adding extra lighting in the home.   Personal safety issues reviewed:  1. Consider starting a community watch program per [facility] 2.  Changes batteries is smoke detector and/or carbon monoxide detector  3.  If you have firearms; keep them in a safe place 4.  Wear protection when in the sun; Always wear sunscreen or a hat; It is good to have your doctor check your skin annually or review any new areas of concern 5. Driving safety; Keep in the right lane; stay 3 car lengths behind the car in front of you on the highway; look 3 times prior to pulling out; carry your cell phone everywhere you go!    Learn about the Yellow Dot program:  The program allows first responders at your emergency to have access to who your physician is, as well as your medications and medical conditions.  Citizens requesting the Yellow Dot Packages should contact Master Corporal Nunzio Cobbs at the [facility] [phone] for the first week of the program and beginning  the week after Easter citizens should contact their Scientist, physiological.   Health Maintenance, Male A healthy lifestyle and preventive care is important for your health and wellness. Ask your health care provider about what schedule of regular examinations is right for you. What should I know about weight and diet? Eat a Healthy Diet  Eat plenty of vegetables, fruits, whole grains, low-fat dairy products, and lean protein.  Do not eat a lot of foods high in solid fats, added sugars, or salt.  Maintain a Healthy Weight Regular exercise can help you achieve or maintain a healthy weight. You should:  Do at least 150 minutes of exercise each week. The exercise should increase your heart rate  and make you sweat (moderate-intensity exercise).  Do strength-training exercises at least twice a week.  Watch Your Levels of Cholesterol and Blood Lipids  Have your blood tested for lipids and cholesterol every 5 years starting at 68 years of age. If you are at high risk for heart disease, you should start having your blood tested when you are 68 years old. You may need to have your cholesterol levels checked more often if: ? Your lipid or cholesterol levels are high. ? You are older than 68 years of age. ? You are at high risk for heart disease.  What should I know about cancer screening? Many types of cancers can be detected early and may often be prevented. Lung Cancer  You should be screened every year for lung cancer if: ? You are a current smoker who has smoked for at least 30 years. ? You are a former smoker who has quit within the past 15 years.  Talk to your health care provider about your screening options, when you should start screening, and how often you should be screened.  Colorectal Cancer  Routine colorectal cancer screening usually begins at 68 years of age and should be repeated every 5-10 years until you are 68 years old. You may need to be screened more often if early forms of precancerous polyps or small growths are found. Your health care provider may recommend screening at an earlier age if you have risk factors for colon cancer.  Your health care provider may recommend using home test kits to check for hidden blood in the stool.  A small camera at the end of a tube can be used to examine your colon (sigmoidoscopy or colonoscopy). This checks for the earliest forms of colorectal cancer.  Prostate and Testicular Cancer  Depending on your age and overall health, your health care provider may do certain tests to screen for prostate and testicular cancer.  Talk to your health care provider about any symptoms or concerns you have about testicular or prostate  cancer.  Skin Cancer  Check your skin from head to toe regularly.  Tell your health care provider about any new moles or changes in moles, especially if: ? There is a change in a mole's size, shape, or color. ? You have a mole that is larger than a pencil eraser.  Always use sunscreen. Apply sunscreen liberally and repeat throughout the day.  Protect yourself by wearing long sleeves, pants, a wide-brimmed hat, and sunglasses when outside.  What should I know about heart disease, diabetes, and high blood pressure?  If you are 58-32 years of age, have your blood pressure checked every 3-5 years. If you are 40 years of age or older, have your blood pressure checked every year. You should have your  blood pressure measured twice-once when you are at a hospital or clinic, and once when you are not at a hospital or clinic. Record the average of the two measurements. To check your blood pressure when you are not at a hospital or clinic, you can use: ? An automated blood pressure machine at a pharmacy. ? A home blood pressure monitor.  Talk to your health care provider about your target blood pressure.  If you are between 54-65 years old, ask your health care provider if you should take aspirin to prevent heart disease.  Have regular diabetes screenings by checking your fasting blood sugar level. ? If you are at a normal weight and have a low risk for diabetes, have this test once every three years after the age of 70. ? If you are overweight and have a high risk for diabetes, consider being tested at a younger age or more often.  A one-time screening for abdominal aortic aneurysm (AAA) by ultrasound is recommended for men aged 72-75 years who are current or former smokers. What should I know about preventing infection? Hepatitis B If you have a higher risk for hepatitis B, you should be screened for this virus. Talk with your health care provider to find out if you are at risk for hepatitis B  infection. Hepatitis C Blood testing is recommended for:  Everyone born from [birth year] through 1965.  Anyone with known risk factors for hepatitis C.  Sexually Transmitted Diseases (STDs)  You should be screened each year for STDs including gonorrhea and chlamydia if: ? You are sexually active and are younger than 68 years of age. ? You are older than 68 years of age and your health care provider tells you that you are at risk for this type of infection. ? Your sexual activity has changed since you were last screened and you are at an increased risk for chlamydia or gonorrhea. Ask your health care provider if you are at risk.  Talk with your health care provider about whether you are at high risk of being infected with HIV. Your health care provider may recommend a prescription medicine to help prevent HIV infection.  What else can I do?  Schedule regular health, dental, and eye exams.  Stay current with your vaccines (immunizations).  Do not use any tobacco products, such as cigarettes, chewing tobacco, and e-cigarettes. If you need help quitting, ask your health care provider.  Limit alcohol intake to no more than 2 drinks per day. One drink equals 12 ounces of beer, 5 ounces of wine, or 1 ounces of hard liquor.  Do not use street drugs.  Do not share needles.  Ask your health care provider for help if you need support or information about quitting drugs.  Tell your health care provider if you often feel depressed.  Tell your health care provider if you have ever been abused or do not feel safe at home. This information is not intended to replace advice given to you by your health care provider. Make sure you discuss any questions you have with your health care provider. Document Released: 01/05/2008 Document Revised: 03/07/2016 Document Reviewed: 04/12/2015 Elsevier Interactive Patient Education  2018 [place] in the Home Falls can cause injuries and  can affect people from all age groups. There are many simple things that you can do to make your home safe and to help prevent falls. What can I do on the outside of my home?  Regularly repair the edges of walkways and driveways and fix any cracks.  Remove high doorway thresholds.  Trim any shrubbery on the main path into your home.  Use bright outdoor lighting.  Clear walkways of debris and clutter, including tools and rocks.  Regularly check that handrails are securely fastened and in good repair. Both sides of any steps should have handrails.  Install guardrails along the edges of any raised decks or porches.  Have leaves, snow, and ice cleared regularly.  Use sand or salt on walkways during winter months.  In the garage, clean up any spills right away, including grease or oil spills. What can I do in the bathroom?  Use night lights.  Install grab bars by the toilet and in the tub and shower. Do not use towel bars as grab bars.  Use non-skid mats or decals on the floor of the tub or shower.  If you need to sit down while you are in the shower, use a plastic, non-slip stool.  Keep the floor dry. Immediately clean up any water that spills on the floor.  Remove soap buildup in the tub or shower on a regular basis.  Attach bath mats securely with double-sided non-slip rug tape.  Remove throw rugs and other tripping hazards from the floor. What can I do in the bedroom?  Use night lights.  Make sure that a bedside light is easy to reach.  Do not use oversized bedding that drapes onto the floor.  Have a firm chair that has side arms to use for getting dressed.  Remove throw rugs and other tripping hazards from the floor. What can I do in the kitchen?  Clean up any spills right away.  Avoid walking on wet floors.  Place frequently used items in easy-to-reach places.  If you need to reach for something above you, use a sturdy step stool that has a grab bar.  Keep  electrical cables out of the way.  Do not use floor polish or wax that makes floors slippery. If you have to use wax, make sure that it is non-skid floor wax.  Remove throw rugs and other tripping hazards from the floor. What can I do in the stairways?  Do not leave any items on the stairs.  Make sure that there are handrails on both sides of the stairs. Fix handrails that are broken or loose. Make sure that handrails are as long as the stairways.  Check any carpeting to make sure that it is firmly attached to the stairs. Fix any carpet that is loose or worn.  Avoid having throw rugs at the top or bottom of stairways, or secure the rugs with carpet tape to prevent them from moving.  Make sure that you have a light switch at the top of the stairs and the bottom of the stairs. If you do not have them, have them installed. What are some other fall prevention tips?  Wear closed-toe shoes that fit well and support your feet. Wear shoes that have rubber soles or low heels.  When you use a stepladder, make sure that it is completely opened and that the sides are firmly locked. Have someone hold the ladder while you are using it. Do not climb a closed stepladder.  Add color or contrast paint or tape to grab bars and handrails in your home. Place contrasting color strips on the first and last steps.  Use mobility aids as needed, such as canes, walkers, scooters, and crutches.  Turn on lights if it is dark. Replace any light bulbs that burn out.  Set up furniture so that there are clear paths. Keep the furniture in the same spot.  Fix any uneven floor surfaces.  Choose a carpet design that does not hide the edge of steps of a stairway.  Be aware of any and all pets.  Review your medicines with your healthcare provider. Some medicines can cause dizziness or changes in blood pressure, which increase your risk of falling. Talk with your health care provider about other ways that you can  decrease your risk of falls. This may include working with a physical therapist or trainer to improve your strength, balance, and endurance. This information is not intended to replace advice given to you by your health care provider. Make sure you discuss any questions you have with your health care provider. Document Released: 06/29/2002 Document Revised: 12/06/2015 Document Reviewed: 08/13/2014 Elsevier Interactive Patient Education  2017 [employer].

## 2017-03-14 NOTE — Progress Notes (Signed)
Subjective:   [name] [name] is a 66 y.o. male who presents for Medicare Annual/Subsequent preventive examination.  The Patient was informed that the wellness visit is to identify future health risk and educate and initiate measures that can reduce risk for increased disease through the lifespan.    Annual Wellness Assessment  Reports health as good   Preventive Screening -Counseling & Management  Medicare Annual Preventive Care Visit - Subsequent S/p cabg in 2005  Last OV 01/2017 w [name]  States he did not have typical chest pain Stress test 8/15  Had been on ramipril; just changed to Cozaar   PSA 11/2015 neg Hep C -agreed to future draw  PVS 23 - declines today Flu - declines today Colonoscopy 10/2011 due 10/2021  Describes Health as poor, fair, good or great?  Good   VS reviewed;   Diet  Mostly lean meats Fruits and vegetable   BMI 28   Exercise HDL 71 In a program at [facility]: Lambert Rhonda W called the HOPE program 3 days a week; 6am and 8:30; organized class During the school year and have students train you He and wife are doing this x 1.5 years   Dental - see a dentist 4 times a year Periodontal disease  q 3 years  Stressors: no  Dad passed away x 3 yo  - 89   Sleep patterns:  Yes  Pain?  No     Cardiac Risk Factors Addressed Hyperlipidemia - chol/hdl 2.2 Diabetes - neg  Obesity neg  Advanced Directives - completed  Patient Care Team: [name], MD as PCP - General (Family Medicine) Assessed for additional providers  Immunization History  Administered Date(s) Administered  . Influenza,inj,Quad PF,6+ Mos 08/20/2013, 03/31/2014  . Pneumococcal Conjugate-13 08/20/2013  . Td 07/23/2008   Required Immunizations needed today  Screening test up to date or reviewed for plan of completion Health Maintenance Due  Topic Date Due  . Hepatitis C Screening  [birth year]  . INFLUENZA VACCINE  02/20/2017    Agreed to future draw of hep c Declined  PPSV 23 Declined Flu at this time; but may take it later     Cardiac Risk Factors include: advanced age (>35men, >78 women);dyslipidemia;family history of premature cardiovascular disease;male gender;hypertension     Objective:    Vitals: BP 120/80   Pulse 96   Ht 6\' 1"  (1.854 m)   Wt 215 lb (97.5 kg)   SpO2 (!) 68%   BMI 28.37 kg/m   Body mass index is 28.37 kg/m.  Tobacco History  Smoking Status  . Former Smoker  . Packs/day: 0.50  . Years: 5.00  . Types: Cigarettes  Smokeless Tobacco  . Never Used    Comment: "quit smoking cigarettes in the late 1970's"     Counseling given: Yes   Past Medical History:  Diagnosis Date  . Chicken pox   . Coronary artery disease    Severe three-vessel coronary artery disease with ejection fraction of 65%  . GERD (gastroesophageal reflux disease)   . History of gout   . History of psoriasis   . Hypercholesterolemia   . Hypertension   . OSA (obstructive sleep apnea)    Moderate with AHI of 16.9/hr now on CPAP at 10cm H2O  . Psoriatic arthritis [facility])    Past Surgical History:  Procedure Laterality Date  . ATRIAL FLUTTER ABLATION  03/30/2014  . ATRIAL FLUTTER ABLATION N/A 03/30/2014   Procedure: ATRIAL FLUTTER ABLATION;  Surgeon: Coralyn Mark, MD;  Location: [facility] CATH LAB;  Service: Cardiovascular;  Laterality: N/A;  . CARDIAC CATHETERIZATION  09/2003   Ejection fraction was estimated at 65%.  . CARDIOVERSION N/A 03/15/2014   Procedure: CARDIOVERSION;  Surgeon: Lelon Perla, MD;  Location: [facility];  Service: Cardiovascular;  Laterality: N/A;  . CORONARY ARTERY BYPASS GRAFT  09/2003   [place]-lad,svg-diag,svg-om/distal LCX,svg-pda  . TEE WITHOUT CARDIOVERSION N/A 03/15/2014   Procedure: TRANSESOPHAGEAL ECHOCARDIOGRAM (TEE);  Surgeon: Lelon Perla, MD;  Location: [facility] ENDOSCOPY;  Service: Cardiovascular;  Laterality: N/A;  . TONSILLECTOMY  1950's   Family History  Problem Relation Age of Onset  . Heart disease Mother         cabg  . Cancer Mother        breast  . Stroke Mother    History  Sexual Activity  . Sexual activity: Yes    Outpatient Encounter Prescriptions as of 03/14/2017  Medication Sig  . aspirin 81 MG EC tablet Take 1 tablet (81 mg total) by mouth daily.  Marland Kitchen atorvastatin (LIPITOR) 40 MG tablet TAKE 1 TABLET DAILY.  . cholecalciferol (VITAMIN D) 1000 UNITS tablet Take 1,000 Units by mouth daily.    . Coenzyme Q10 (CO Q 10 PO) Take 100 mg by mouth daily.   . colchicine 0.6 MG tablet TAKE 1 TABLET TWICE DAILY.  Marland Kitchen losartan (COZAAR) 50 MG tablet Take 1 tablet (50 mg total) by mouth daily.  . metoprolol succinate (TOPROL-XL) 25 MG 24 hr tablet Take 1 tablet (25 mg total) by mouth daily.  . multivitamin (THERAGRAN) per tablet Take 1 tablet by mouth daily.    . nitroGLYCERIN (NITROSTAT) 0.4 MG SL tablet PLACE 1 TABLET UNDER THE TONGUE EVERY 5 MINUTES AS NEEDED FOR CHEST PAIN.  . NON FORMULARY Borage Oil as directed  . Omega-3 Fatty Acids (FISH OIL CONCENTRATE PO) Take 1,700 mg by mouth daily.    . sildenafil (REVATIO) 20 MG tablet Take 2 to 5 tablets one hour prior to sexual activity   No facility-administered encounter medications on file as of 03/14/2017.     Activities of Daily Living In your present state of health, do you have any difficulty performing the following activities: 03/14/2017  Hearing? N  Vision? N  Difficulty concentrating or making decisions? N  Walking or climbing stairs? N  Dressing or bathing? N  Doing errands, shopping? N  Preparing Food and eating ? N  Using the Toilet? N  In the past six months, have you accidently leaked urine? N  Do you have problems with loss of bowel control? N  Managing your Medications? N  Managing your Finances? N  Housekeeping or managing your Housekeeping? N  Some recent data might be hidden    Patient Care Team: [name], MD as PCP - General (Family Medicine)   Assessment:     Exercise Activities and Dietary  recommendations Current Exercise Habits: Structured exercise class, Type of exercise: walking, Time (Minutes): 60, Frequency (Times/Week): 3, Weekly Exercise (Minutes/Week): 180, Intensity: Moderate  Goals    . Weight (lb) < 200 lb (90.7 kg)          Plans to change eating habits Slow down on eating out at lunch  One larger and one smaller a day  Can use http://vang.com/      Fall Risk Fall Risk  03/14/2017 10/12/2014  Falls in the past year? Yes No  Number falls in past yr: 1 -  Comment fell going hiking  -  Follow up  Education provided -   Depression Screen PHQ 2/9 Scores 03/14/2017 10/12/2014  PHQ - 2 Score 0 0    Cognitive Function MMSE - Mini Mental State Exam 03/14/2017  Not completed: (No Data)   Ad8 score reviewed for issues:  Issues making decisions:  Less interest in hobbies / activities:  Repeats questions, stories (family complaining):  Trouble using ordinary gadgets (microwave, computer, phone):  Forgets the month or year:   Mismanaging finances:   Remembering appts:  Daily problems with thinking and/or memory: Ad8 score is=0          Immunization History  Administered Date(s) Administered  . Influenza,inj,Quad PF,6+ Mos 08/20/2013, 03/31/2014  . Pneumococcal Conjugate-13 08/20/2013  . Td 07/23/2008   Screening Tests Health Maintenance  Topic Date Due  . Hepatitis C Screening  Mar 22, 1951  . INFLUENZA VACCINE  02/20/2017  . PNA vac Low Risk Adult (2 of 2 - PPSV23) 07/22/2017 (Originally 08/20/2014)  . TETANUS/TDAP  07/23/2018  . COLONOSCOPY  11/06/2021      Plan:     PCP Notes   Health Maintenance Agreed to future draw of hep c Declined PPSV 23 Declined Flu at this time; but may take it later   Educated regarding the shingrix   Abnormal Screens  no Referrals  no  Patient concerns; None; good report from cardiology   Nurse Concerns; no  Next PCP apt TBS      I have personally reviewed and noted the following in the  patient's chart:   . Medical and social history . Use of alcohol, tobacco or illicit drugs  . Current medications and supplements . Functional ability and status . Nutritional status . Physical activity . Advanced directives . List of other physicians . Hospitalizations, surgeries, and ER visits in previous 12 months . Vitals . Screenings to include cognitive, depression, and falls . Referrals and appointments  In addition, I have reviewed and discussed with patient certain preventive protocols, quality metrics, and best practice recommendations. A written personalized care plan for preventive services as well as general preventive health recommendations were provided to patient.     Wynetta Fines, RN  03/14/2017

## 2017-03-15 NOTE — Progress Notes (Signed)
KIM, HANNAH R., DO  

## 2017-04-02 ENCOUNTER — Other Ambulatory Visit: Payer: Self-pay | Admitting: Cardiology

## 2017-04-02 DIAGNOSIS — L400 Psoriasis vulgaris: Secondary | ICD-10-CM | POA: Diagnosis not present

## 2017-04-02 DIAGNOSIS — D225 Melanocytic nevi of trunk: Secondary | ICD-10-CM | POA: Diagnosis not present

## 2017-04-02 DIAGNOSIS — L814 Other melanin hyperpigmentation: Secondary | ICD-10-CM | POA: Diagnosis not present

## 2017-04-02 DIAGNOSIS — Z85820 Personal history of malignant melanoma of skin: Secondary | ICD-10-CM | POA: Diagnosis not present

## 2017-04-02 DIAGNOSIS — L821 Other seborrheic keratosis: Secondary | ICD-10-CM | POA: Diagnosis not present

## 2017-04-02 NOTE — Telephone Encounter (Signed)
REFILL 

## 2017-04-23 ENCOUNTER — Telehealth: Payer: Self-pay | Admitting: *Deleted

## 2017-04-23 ENCOUNTER — Other Ambulatory Visit: Payer: Self-pay | Admitting: Cardiology

## 2017-04-23 NOTE — Telephone Encounter (Signed)
Patient called with a question about getting some of his medications changed, patient said he would like to go over it with the nurse or Dr Elease Hashimoto. [phone]

## 2017-04-24 NOTE — Telephone Encounter (Signed)
Left message on machine for patient to return our call 

## 2017-04-24 NOTE — Telephone Encounter (Signed)
Only other option would be Cialis- but I'm reluctant b/o longer half life and potential need for nitroglycerin in heart patient.  Offer follow up to discuss.

## 2017-04-24 NOTE — Telephone Encounter (Signed)
Spoke with patient.  He no longer takes Viagra due to cost.  The sildenafil is causing heartburn and GI upset.  Is there anything else he can try?  He is aware that insurance may not cover the cost.  [place]

## 2017-04-24 NOTE — Telephone Encounter (Signed)
Pt is returning rachel call °

## 2017-04-26 NOTE — Telephone Encounter (Signed)
Left message on machine for patient to return our call 

## 2017-04-26 NOTE — Telephone Encounter (Signed)
Patient is aware and an appointment made 

## 2017-05-06 ENCOUNTER — Encounter: Payer: Self-pay | Admitting: Family Medicine

## 2017-05-06 ENCOUNTER — Ambulatory Visit (INDEPENDENT_AMBULATORY_CARE_PROVIDER_SITE_OTHER): Payer: PPO | Admitting: Family Medicine

## 2017-05-06 VITALS — BP 120/80 | HR 64 | Temp 98.3°F | Wt 211.7 lb

## 2017-05-06 DIAGNOSIS — N529 Male erectile dysfunction, unspecified: Secondary | ICD-10-CM

## 2017-05-06 DIAGNOSIS — I10 Essential (primary) hypertension: Secondary | ICD-10-CM | POA: Diagnosis not present

## 2017-05-06 DIAGNOSIS — M1A9XX0 Chronic gout, unspecified, without tophus (tophi): Secondary | ICD-10-CM

## 2017-05-06 DIAGNOSIS — Z23 Encounter for immunization: Secondary | ICD-10-CM

## 2017-05-06 DIAGNOSIS — I25810 Atherosclerosis of coronary artery bypass graft(s) without angina pectoris: Secondary | ICD-10-CM | POA: Diagnosis not present

## 2017-05-06 MED ORDER — TADALAFIL 20 MG PO TABS: ORAL_TABLET | ORAL | 11 refills | Status: DC

## 2017-05-06 NOTE — Progress Notes (Signed)
Subjective:     Patient ID: [name] [name], male   DOB: Aug 29, 1950, 66 y.o.   MRN: [id number]  HPI Patient has medical problems including history of CAD, hypertension, obstructive sleep apnea, psoriasis, hyperlipidemia, gout  Here today to discuss erectile dysfunction. He is using Viagra in the past which works for his ED but he has had problems with GI side effects especially with GERD symptoms at higher dose. He had specifically called in with questions about using Cialis. He does have nitroglycerin on his med list but basically has never taken this. He is aware of contraindication with taking drugs like Viagra and Cialis with nitroglycerin. He exercises regularly had no chest pains at all recently. Recent exercise stress test which checked out very well.  He has history of gout and takes colchicine intermittently. Generally well-controlled. Blood pressures have been very well controlled with losartan and metoprolol. Had lipids checked recently through cardiology and these were stable.  Patient needs flu vaccine. He also has not had Pneumovax but he declines getting both the same visit.  Past Medical History:  Diagnosis Date  . Chicken pox   . Coronary artery disease    Severe three-vessel coronary artery disease with ejection fraction of 65%  . GERD (gastroesophageal reflux disease)   . History of gout   . History of psoriasis   . Hypercholesterolemia   . Hypertension   . OSA (obstructive sleep apnea)    Moderate with AHI of 16.9/hr now on CPAP at 10cm H2O  . Psoriatic arthritis [facility])    Past Surgical History:  Procedure Laterality Date  . ATRIAL FLUTTER ABLATION  03/30/2014  . ATRIAL FLUTTER ABLATION N/A 03/30/2014   Procedure: ATRIAL FLUTTER ABLATION;  Surgeon: Coralyn Mark, MD;  Location: [place] CATH LAB;  Service: Cardiovascular;  Laterality: N/A;  . CARDIAC CATHETERIZATION  09/2003   Ejection fraction was estimated at 65%.  . CARDIOVERSION N/A 03/15/2014   Procedure: CARDIOVERSION;   Surgeon: Lelon Perla, MD;  Location: [place];  Service: Cardiovascular;  Laterality: N/A;  . CORONARY ARTERY BYPASS GRAFT  09/2003   Lima-lad,svg-diag,svg-om/distal LCX,svg-pda  . TEE WITHOUT CARDIOVERSION N/A 03/15/2014   Procedure: TRANSESOPHAGEAL ECHOCARDIOGRAM (TEE);  Surgeon: Lelon Perla, MD;  Location: [facility] ENDOSCOPY;  Service: Cardiovascular;  Laterality: N/A;  . TONSILLECTOMY  1950's    reports that he has quit smoking. His smoking use included Cigarettes. He has a 2.50 pack-year smoking history. He has never used smokeless tobacco. He reports that he drinks about 2.4 oz of alcohol per week . He reports that he does not use drugs. family history includes Cancer in his mother; Heart disease in his mother; Stroke in his mother. No Known Allergies   Review of Systems  Constitutional: Negative for fatigue.  Eyes: Negative for visual disturbance.  Respiratory: Negative for cough, chest tightness and shortness of breath.   Cardiovascular: Negative for chest pain, palpitations and leg swelling.  Neurological: Negative for dizziness, syncope, weakness, light-headedness and headaches.       Objective:   Physical Exam  Constitutional: He is oriented to person, place, and time. He appears well-developed and well-nourished.  HENT:  Right Ear: External ear normal.  Left Ear: External ear normal.  Mouth/Throat: Oropharynx is clear and moist.  Eyes: Pupils are equal, round, and reactive to light.  Neck: Neck supple. No thyromegaly present.  Cardiovascular: Normal rate and regular rhythm.   Pulmonary/Chest: Effort normal and breath sounds normal. No respiratory distress. He has no wheezes. He  has no rales.  Musculoskeletal: He exhibits no edema.  Neurological: He is alert and oriented to person, place, and time.       Assessment:     #1 erectile dysfunction-past history of GI side effects with Viagra  #2 history of CAD  #3 history of gout stable with infrequent use of  colchicine  #4 hypertension stable and at goal      Plan:     -Flu vaccine given. Patient also needs Pneumovax and he wishes to return later for that -We discussed issues regarding medications for ED. We made him aware of absolute contraindication of using nitroglycerin with drugs like Cialis. Also he is aware of 36 hour duration of Cialis. Fortunately, his CAD has been very stable and he has never had to use nitroglycerin. We did agree to prescribe for Cialis and he really was not to overlap nitroglycerin within 2 days of use of Cialis -Routine follow-up in 6 months and sooner as needed  [name] MD [place] [facility] [facility] at [facility]

## 2017-05-06 NOTE — Patient Instructions (Signed)
Make sure you don't take any nitroglycerin within 2 days of taking the Cialis. Remember to return for pneumovax soon.

## 2017-05-08 DIAGNOSIS — R5383 Other fatigue: Secondary | ICD-10-CM | POA: Diagnosis not present

## 2017-05-08 DIAGNOSIS — L409 Psoriasis, unspecified: Secondary | ICD-10-CM | POA: Diagnosis not present

## 2017-05-08 DIAGNOSIS — Z6827 Body mass index (BMI) 27.0-27.9, adult: Secondary | ICD-10-CM | POA: Diagnosis not present

## 2017-05-08 DIAGNOSIS — E663 Overweight: Secondary | ICD-10-CM | POA: Diagnosis not present

## 2017-05-08 DIAGNOSIS — M2550 Pain in unspecified joint: Secondary | ICD-10-CM | POA: Diagnosis not present

## 2017-05-17 DIAGNOSIS — L409 Psoriasis, unspecified: Secondary | ICD-10-CM | POA: Diagnosis not present

## 2017-05-17 DIAGNOSIS — E663 Overweight: Secondary | ICD-10-CM | POA: Diagnosis not present

## 2017-05-17 DIAGNOSIS — Z6828 Body mass index (BMI) 28.0-28.9, adult: Secondary | ICD-10-CM | POA: Diagnosis not present

## 2017-05-17 DIAGNOSIS — L4059 Other psoriatic arthropathy: Secondary | ICD-10-CM | POA: Diagnosis not present

## 2017-05-17 DIAGNOSIS — E790 Hyperuricemia without signs of inflammatory arthritis and tophaceous disease: Secondary | ICD-10-CM | POA: Diagnosis not present

## 2017-05-30 ENCOUNTER — Telehealth: Payer: Self-pay | Admitting: Cardiology

## 2017-05-30 NOTE — Telephone Encounter (Signed)
Received call back from patient, patient states he is going to be participating in a study at [facility] regarding exercise and memory.   States he will go exercise and get his HR up to 115-130 and then they will call him the next day to ask him memory questions.   Patient states he will be dropping off a form that they need [name] to sign for him to participate.     Advised [name] is OOO tomorrow but back on Monday-patient will bring the form by today.  Advised I will make primary nurse aware.

## 2017-05-30 NOTE — Telephone Encounter (Signed)
Left message to call back  

## 2017-05-30 NOTE — Telephone Encounter (Signed)
New message  Pt verbalized that he is calling for the rn  He want to leave a form today for Dr.Jordan to fillout

## 2017-06-03 NOTE — Telephone Encounter (Signed)
Returned call to patient.Left message on personal voice mail Dr.Jordan signed form you brought by office from [facility].I faxed form back to Kathlyn [place] at fax # [phone].I also mailed you a copy.

## 2017-07-03 ENCOUNTER — Encounter: Payer: Self-pay | Admitting: Cardiology

## 2017-07-03 ENCOUNTER — Ambulatory Visit: Payer: PPO | Admitting: Cardiology

## 2017-07-03 ENCOUNTER — Ambulatory Visit: Payer: PPO | Admitting: Family Medicine

## 2017-07-03 VITALS — BP 140/86 | HR 142 | Ht 73.0 in | Wt 215.2 lb

## 2017-07-03 DIAGNOSIS — I481 Persistent atrial fibrillation: Secondary | ICD-10-CM | POA: Diagnosis not present

## 2017-07-03 DIAGNOSIS — I4819 Other persistent atrial fibrillation: Secondary | ICD-10-CM

## 2017-07-03 MED ORDER — APIXABAN 5 MG PO TABS: 5.0000 mg | ORAL_TABLET | Freq: Two times a day (BID) | ORAL | 11 refills | Status: DC

## 2017-07-03 MED ORDER — METOPROLOL SUCCINATE ER 50 MG PO TB24: 50.0000 mg | ORAL_TABLET | Freq: Two times a day (BID) | ORAL | 3 refills | Status: DC

## 2017-07-03 NOTE — Progress Notes (Signed)
Cardiology Office Note   Date:  07/03/2017   ID:  [name], [name] 09/21/1950, MRN [id number]  PCP:  [name], MD  Cardiologist:   Aayliah Rotenberry Martinique, MD   Chief Complaint  Patient presents with  . Atrial Fibrillation      History of Present Illness: [name] [name] is a 66 y.o. male who is seen for evaluation of palpitations. He has a history of  atrial flutter and CAD. He is status post CABG in 2005. He had a normal stress echo in May of 2013. In August 2015 he presented with atrial flutter with RVR. He had mildly elevated troponins. He had successful TEE guided DCCV. He then underwent ablation of atrial flutter and ectopic atrial tachycardia on 03/30/14 by Dr. [name].  Stress Myoview September 2015 demonstrated excellent exercise tolerance and normal perfusion. TEE showed normal LV function, mild biatrial enlargement, mild MR,TR,AI. He does have moderate obstructive sleep apnea followed by Dr. Radford Pax and is on CPAP therapy.  On presentation today he notes an irregular and fast pulse for the past week. He otherwise has felt fine without  chest pain or SOB. He exercises  3x/week. He denies any dizziness, syncope, PND or orthopnea. does note some swelling in his legs.     Past Medical History:  Diagnosis Date  . Chicken pox   . Coronary artery disease    Severe three-vessel coronary artery disease with ejection fraction of 65%  . GERD (gastroesophageal reflux disease)   . History of gout   . History of psoriasis   . Hypercholesterolemia   . Hypertension   . OSA (obstructive sleep apnea)    Moderate with AHI of 16.9/hr now on CPAP at 10cm H2O  . Psoriatic arthritis [facility])     Past Surgical History:  Procedure Laterality Date  . ATRIAL FLUTTER ABLATION  03/30/2014  . ATRIAL FLUTTER ABLATION N/A 03/30/2014   Procedure: ATRIAL FLUTTER ABLATION;  Surgeon: Coralyn Mark, MD;  Location: [place] CATH LAB;  Service: Cardiovascular;  Laterality: N/A;  . CARDIAC CATHETERIZATION   09/2003   Ejection fraction was estimated at 65%.  . CARDIOVERSION N/A 03/15/2014   Procedure: CARDIOVERSION;  Surgeon: Lelon Perla, MD;  Location: [place];  Service: Cardiovascular;  Laterality: N/A;  . CORONARY ARTERY BYPASS GRAFT  09/2003   Lima-lad,svg-diag,svg-om/distal LCX,svg-pda  . TEE WITHOUT CARDIOVERSION N/A 03/15/2014   Procedure: TRANSESOPHAGEAL ECHOCARDIOGRAM (TEE);  Surgeon: Lelon Perla, MD;  Location: [facility] ENDOSCOPY;  Service: Cardiovascular;  Laterality: N/A;  . TONSILLECTOMY  1950's     Current Outpatient Medications  Medication Sig Dispense Refill  . atorvastatin (LIPITOR) 40 MG tablet TAKE 1 TABLET DAILY. 30 tablet 6  . cholecalciferol (VITAMIN D) 1000 UNITS tablet Take 1,000 Units by mouth daily.      . Coenzyme Q10 (CO Q 10 PO) Take 100 mg by mouth daily.     . Colchicine 0.6 MG CAPS Take by mouth daily as needed.    Marland Kitchen losartan (COZAAR) 50 MG tablet Take 1 tablet (50 mg total) by mouth daily. 90 tablet 3  . multivitamin (THERAGRAN) per tablet Take 1 tablet by mouth daily.      . nitroGLYCERIN (NITROSTAT) 0.4 MG SL tablet PLACE 1 TABLET UNDER THE TONGUE EVERY 5 MINUTES AS NEEDED FOR CHEST PAIN. 25 tablet 0  . NON FORMULARY Borage Oil as directed    . Omega-3 Fatty Acids (FISH OIL CONCENTRATE PO) Take 1,700 mg by mouth daily.      Marland Kitchen  tadalafil (CIALIS) 20 MG tablet Take one every other day as needed. 6 tablet 11  . apixaban (ELIQUIS) 5 MG TABS tablet Take 1 tablet (5 mg total) by mouth 2 (two) times daily. 60 tablet 11  . metoprolol succinate (TOPROL-XL) 50 MG 24 hr tablet Take 1 tablet (50 mg total) by mouth 2 (two) times daily. Take with or immediately following a meal. 180 tablet 3   No current facility-administered medications for this visit.     Allergies:   Patient has no known allergies.    Social History:  The patient  reports that he has quit smoking. His smoking use included cigarettes. He has a 2.50 pack-year smoking history. he has never used  smokeless tobacco. He reports that he drinks about 2.4 oz of alcohol per week. He reports that he does not use drugs.   Family History:  The patient's family history includes Cancer in his mother; Heart disease in his mother; Stroke in his mother.    ROS:  Please see the history of present illness.   Otherwise, review of systems are positive for none.   All other systems are reviewed and negative.    PHYSICAL EXAM: VS:  BP 140/86   Pulse (!) 142   Ht 6\' 1"  (1.854 m)   Wt 215 lb 4 oz (97.6 kg)   BMI 28.40 kg/m  , BMI Body mass index is 28.4 kg/m. GENERAL:  Well appearing WM in NAD HEENT:  PERRL, EOMI, sclera are clear. Oropharynx is clear. NECK:  No jugular venous distention, carotid upstroke brisk and symmetric, no bruits, no thyromegaly or adenopathy LUNGS:  Clear to auscultation bilaterally CHEST:  Unremarkable HEART:  IRRR, fast.  PMI not displaced or sustained,S1 and S2 within normal limits, no S3, no S4: no clicks, no rubs, no murmurs ABD:  Soft, nontender. BS +, no masses or bruits. No hepatomegaly, no splenomegaly EXT:  2 + pulses throughout, no edema, no cyanosis no clubbing SKIN:  Warm and dry.  No rashes NEURO:  Alert and oriented x 3. Cranial nerves II through XII intact. PSYCH:  Cognitively intact     EKG:  EKG  Today shows Afib with RVR rate 142. Nonspecific ST- T abnormality. I have personally reviewed and interpreted this study.   Recent Labs: 02/14/2017: ALT 27; BNP 151.3; BUN 10; Creatinine, Ser 0.94; Hemoglobin 16.4; Platelets 169; Potassium 4.3; Sodium 139  Dated 05/08/17: normal CMET and CBC  Lipid Panel    Component Value Date/Time   CHOL 153 02/14/2017 0000   TRIG 65 02/14/2017 0000   HDL 71 02/14/2017 0000   CHOLHDL 2.2 02/14/2017 0000   CHOLHDL 3 11/23/2015 0837   VLDL 16.4 11/23/2015 0837   LDLCALC 69 02/14/2017 0000      Wt Readings from Last 3 Encounters:  07/03/17 215 lb 4 oz (97.6 kg)  05/06/17 211 lb 11.2 oz (96 kg)  03/14/17 215 lb  (97.5 kg)      Other studies Reviewed: Additional studies/ records that were reviewed today include: none.  ETT 03/06/17: Study Highlights     Blood pressure demonstrated a hypertensive response to exercise.  Upsloping ST segment depression ST segment depression of 1 mm was noted during stress in the II, III, aVF, V4 and V5 leads, and returning to baseline after less than 1 minute of recovery.   1. Good exercise tolerance.  2. Upsloping ST depression in inferior leads and V4/V5.  This is nonspecific.   No evidence for ischemia.  ASSESSMENT AND PLAN:  1. Atrial fibriillation with RVR. New onset. Prior history of atrial flutter and atrial tachycardia ablation in 2015. Recommend initiation of anticoagulation with Eliquis 5 mg bid. [place] vasc score of 3. Will increase Toprol XL to 50 mg bid for better rate control. Will check baseline labs and schedule for an Echocardiogram. Follow up in 3 weeks and if still in Afib at that time schedule for DCCV. He is to call if symptoms worsen. If Afib recurs post DCCV would consider for AAD therapy or ablation.   2. Coronary disease status post CABG in 2005. He is asymptomatic. He had a normal Myoview study September 2015. Low risk ETT in August 2018. We will continue risk factor modification. Will stop ASA on Eliquis.     3. Hyperlipidemia. On statin therapy with good control.  4. Hypertension-BP is well controlled.  5. Atrial flutter s/p ablation.   6. OSA- now on CPAP. Followed by Dr. Radford Pax.  7. Edema. Chronic. Mild. Hold off on diuretic for now.      Disposition:   FU with me in 6 months   Signed, [name] Pauley Martinique, MD  07/03/2017 4:57 PM    [place]

## 2017-07-03 NOTE — Patient Instructions (Signed)
Increase Toprol XL to 50 mg twice a day  We will start you on Eliquis 5 mg twice a day  Stop ASA  We will check blood work  We will schedule you for an Echocardiogram  Follow up in 3 weeks.

## 2017-07-04 DIAGNOSIS — I481 Persistent atrial fibrillation: Secondary | ICD-10-CM | POA: Diagnosis not present

## 2017-07-05 ENCOUNTER — Encounter: Payer: Self-pay | Admitting: Family Medicine

## 2017-07-05 ENCOUNTER — Ambulatory Visit: Payer: PPO | Admitting: Family Medicine

## 2017-07-05 ENCOUNTER — Telehealth: Payer: Self-pay | Admitting: Cardiology

## 2017-07-05 VITALS — BP 100/64 | HR 80 | Temp 97.7°F | Wt 217.3 lb

## 2017-07-05 DIAGNOSIS — R05 Cough: Secondary | ICD-10-CM

## 2017-07-05 DIAGNOSIS — R059 Cough, unspecified: Secondary | ICD-10-CM

## 2017-07-05 LAB — CBC WITH DIFFERENTIAL/PLATELET
BASOS ABS: 0 10*3/uL (ref 0.0–0.2)
BASOS: 0 %
EOS (ABSOLUTE): 0.1 10*3/uL (ref 0.0–0.4)
Eos: 1 %
HEMATOCRIT: 43.2 % (ref 37.5–51.0)
HEMOGLOBIN: 14.8 g/dL (ref 13.0–17.7)
IMMATURE GRANS (ABS): 0 10*3/uL (ref 0.0–0.1)
Immature Granulocytes: 0 %
LYMPHS ABS: 1.3 10*3/uL (ref 0.7–3.1)
LYMPHS: 13 %
MCH: 31.8 pg (ref 26.6–33.0)
MCHC: 34.3 g/dL (ref 31.5–35.7)
MCV: 93 fL (ref 79–97)
Monocytes Absolute: 1.5 10*3/uL — ABNORMAL HIGH (ref 0.1–0.9)
Monocytes: 15 %
NEUTROS ABS: 6.9 10*3/uL (ref 1.4–7.0)
Neutrophils: 71 %
Platelets: 204 10*3/uL (ref 150–379)
RBC: 4.65 x10E6/uL (ref 4.14–5.80)
RDW: 13.3 % (ref 12.3–15.4)
WBC: 9.8 10*3/uL (ref 3.4–10.8)

## 2017-07-05 LAB — COMPREHENSIVE METABOLIC PANEL
ALBUMIN: 4.6 g/dL (ref 3.6–4.8)
ALT: 36 IU/L (ref 0–44)
AST: 31 IU/L (ref 0–40)
Albumin/Globulin Ratio: 2.1 (ref 1.2–2.2)
Alkaline Phosphatase: 81 IU/L (ref 39–117)
BUN / CREAT RATIO: 9 — AB (ref 10–24)
BUN: 9 mg/dL (ref 8–27)
Bilirubin Total: 0.9 mg/dL (ref 0.0–1.2)
CO2: 21 mmol/L (ref 20–29)
CREATININE: 1.01 mg/dL (ref 0.76–1.27)
Calcium: 9.9 mg/dL (ref 8.6–10.2)
Chloride: 103 mmol/L (ref 96–106)
GFR calc non Af Amer: 76 mL/min/{1.73_m2} (ref >59)
GFR, EST AFRICAN AMERICAN: 88 mL/min/{1.73_m2} (ref >59)
GLOBULIN, TOTAL: 2.2 g/dL (ref 1.5–4.5)
GLUCOSE: 121 mg/dL — AB (ref 65–99)
Potassium: 4.1 mmol/L (ref 3.5–5.2)
SODIUM: 140 mmol/L (ref 134–144)
TOTAL PROTEIN: 6.8 g/dL (ref 6.0–8.5)

## 2017-07-05 LAB — TSH: TSH: 0.956 u[IU]/mL (ref 0.450–4.500)

## 2017-07-05 LAB — BRAIN NATRIURETIC PEPTIDE: BNP: 421.5 pg/mL — ABNORMAL HIGH (ref 0.0–100.0)

## 2017-07-05 MED ORDER — HYDROCODONE-HOMATROPINE 5-1.5 MG/5ML PO SYRP: 5.0000 mL | ORAL_SOLUTION | Freq: Four times a day (QID) | ORAL | 0 refills | Status: AC | PRN

## 2017-07-05 NOTE — Telephone Encounter (Signed)
Returned call to patient lab results given. 

## 2017-07-05 NOTE — Patient Instructions (Signed)
Cough, Adult Coughing is a reflex that clears your throat and your airways. Coughing helps to heal and protect your lungs. It is normal to cough occasionally, but a cough that happens with other symptoms or lasts a long time may be a sign of a condition that needs treatment. A cough may last only 2-3 weeks (acute), or it may last longer than 8 weeks (chronic). What are the causes? Coughing is commonly caused by:  Breathing in substances that irritate your lungs.  A viral or bacterial respiratory infection.  Allergies.  Asthma.  Postnasal drip.  Smoking.  Acid backing up from the stomach into the esophagus (gastroesophageal reflux).  Certain medicines.  Chronic lung problems, including COPD (or rarely, lung cancer).  Other medical conditions such as heart failure.  Follow these instructions at home: Pay attention to any changes in your symptoms. Take these actions to help with your discomfort:  Take medicines only as told by your health care provider. ? If you were prescribed an antibiotic medicine, take it as told by your health care provider. Do not stop taking the antibiotic even if you start to feel better. ? Talk with your health care provider before you take a cough suppressant medicine.  Drink enough fluid to keep your urine clear or pale yellow.  If the air is dry, use a cold steam vaporizer or humidifier in your bedroom or your home to help loosen secretions.  Avoid anything that causes you to cough at work or at home.  If your cough is worse at night, try sleeping in a semi-upright position.  Avoid cigarette smoke. If you smoke, quit smoking. If you need help quitting, ask your health care provider.  Avoid caffeine.  Avoid alcohol.  Rest as needed.  Contact a health care provider if:  You have new symptoms.  You cough up pus.  Your cough does not get better after 2-3 weeks, or your cough gets worse.  You cannot control your cough with suppressant  medicines and you are losing sleep.  You develop pain that is getting worse or pain that is not controlled with pain medicines.  You have a fever.  You have unexplained weight loss.  You have night sweats. Get help right away if:  You cough up blood.  You have difficulty breathing.  Your heartbeat is very fast. This information is not intended to replace advice given to you by your health care provider. Make sure you discuss any questions you have with your health care provider. Document Released: 01/05/2011 Document Revised: 12/15/2015 Document Reviewed: 09/15/2014 Elsevier Interactive Patient Education  2017 Elsevier Inc.  

## 2017-07-05 NOTE — Telephone Encounter (Signed)
New message  Pt verbalized that he is calling for the rn  For results

## 2017-07-05 NOTE — Progress Notes (Signed)
Subjective:     Patient ID: [name] [name], male   DOB: 1951-07-17, 66 y.o.   MRN: [id number]  HPI Patient seen with cough for the past 3 weeks. He started out with cold-like symptoms but now has mostly dry cough. Occasionally productive. Interfering with sleep. Tried Delsym without relief. Also tried some tea and honey. No fever. No facial pain. No chronic skin sinusitis symptoms.  Recent recurrence of A. fib with rapid ventricular response. Saw cardiologist 2 days ago and they increased his Toprol 50 mgs to twice a day and started eliquis. He has follow-up there in a few weeks. He states his heart rate at home has been 80s-past couple of days. No chest pains. No dyspnea.  Past Medical History:  Diagnosis Date  . Chicken pox   . Coronary artery disease    Severe three-vessel coronary artery disease with ejection fraction of 65%  . GERD (gastroesophageal reflux disease)   . History of gout   . History of psoriasis   . Hypercholesterolemia   . Hypertension   . OSA (obstructive sleep apnea)    Moderate with AHI of 16.9/hr now on CPAP at 10cm H2O  . Psoriatic arthritis [facility])    Past Surgical History:  Procedure Laterality Date  . ATRIAL FLUTTER ABLATION  03/30/2014  . ATRIAL FLUTTER ABLATION N/A 03/30/2014   Procedure: ATRIAL FLUTTER ABLATION;  Surgeon: Coralyn Mark, MD;  Location: [place] CATH LAB;  Service: Cardiovascular;  Laterality: N/A;  . CARDIAC CATHETERIZATION  09/2003   Ejection fraction was estimated at 65%.  . CARDIOVERSION N/A 03/15/2014   Procedure: CARDIOVERSION;  Surgeon: Lelon Perla, MD;  Location: [place];  Service: Cardiovascular;  Laterality: N/A;  . CORONARY ARTERY BYPASS GRAFT  09/2003   Lima-lad,svg-diag,svg-om/distal LCX,svg-pda  . TEE WITHOUT CARDIOVERSION N/A 03/15/2014   Procedure: TRANSESOPHAGEAL ECHOCARDIOGRAM (TEE);  Surgeon: Lelon Perla, MD;  Location: [facility] ENDOSCOPY;  Service: Cardiovascular;  Laterality: N/A;  . TONSILLECTOMY  1950's    reports  that he has quit smoking. His smoking use included cigarettes. He has a 2.50 pack-year smoking history. he has never used smokeless tobacco. He reports that he drinks about 2.4 oz of alcohol per week. He reports that he does not use drugs. family history includes Cancer in his mother; Heart disease in his mother; Stroke in his mother. No Known Allergies   Review of Systems  Constitutional: Negative for chills and fever.  HENT: Negative for congestion.   Respiratory: Positive for cough. Negative for shortness of breath and wheezing.   Cardiovascular: Negative for chest pain.       Objective:   Physical Exam  Constitutional: He appears well-developed and well-nourished.  HENT:  Right Ear: External ear normal.  Left Ear: External ear normal.  Mouth/Throat: Oropharynx is clear and moist. No oropharyngeal exudate.  Neck: Neck supple.  Cardiovascular: Normal rate.  irregular rhythm  Pulmonary/Chest: Effort normal and breath sounds normal. No respiratory distress. He has no wheezes. He has no rales.  Lymphadenopathy:    He has no cervical adenopathy.       Assessment:     Cough. Suspect related to recent viral process. Nonfocal exam.    Plan:     -Hycodan cough syrup 1 teaspoon every 6 hours. For severe cough. Warned about potential for sedation -Follow-up immediately for any fever or other concerns  [name] MD [place] [facility] at [facility]

## 2017-07-08 ENCOUNTER — Other Ambulatory Visit: Payer: Self-pay

## 2017-07-08 ENCOUNTER — Telehealth: Payer: Self-pay | Admitting: Cardiology

## 2017-07-08 ENCOUNTER — Ambulatory Visit (HOSPITAL_COMMUNITY): Payer: PPO | Attending: Cardiovascular Disease

## 2017-07-08 DIAGNOSIS — I119 Hypertensive heart disease without heart failure: Secondary | ICD-10-CM | POA: Diagnosis not present

## 2017-07-08 DIAGNOSIS — Z951 Presence of aortocoronary bypass graft: Secondary | ICD-10-CM | POA: Diagnosis not present

## 2017-07-08 DIAGNOSIS — E7800 Pure hypercholesterolemia, unspecified: Secondary | ICD-10-CM | POA: Insufficient documentation

## 2017-07-08 DIAGNOSIS — I4819 Other persistent atrial fibrillation: Secondary | ICD-10-CM

## 2017-07-08 DIAGNOSIS — I2510 Atherosclerotic heart disease of native coronary artery without angina pectoris: Secondary | ICD-10-CM | POA: Diagnosis not present

## 2017-07-08 DIAGNOSIS — I481 Persistent atrial fibrillation: Secondary | ICD-10-CM | POA: Diagnosis not present

## 2017-07-08 DIAGNOSIS — I080 Rheumatic disorders of both mitral and aortic valves: Secondary | ICD-10-CM | POA: Insufficient documentation

## 2017-07-08 DIAGNOSIS — G4733 Obstructive sleep apnea (adult) (pediatric): Secondary | ICD-10-CM | POA: Insufficient documentation

## 2017-07-08 MED ORDER — POTASSIUM CHLORIDE CRYS ER 20 MEQ PO TBCR: 20.0000 meq | EXTENDED_RELEASE_TABLET | Freq: Every day | ORAL | 6 refills | Status: AC

## 2017-07-08 MED ORDER — FUROSEMIDE 20 MG PO TABS: 20.0000 mg | ORAL_TABLET | Freq: Every day | ORAL | 6 refills | Status: DC

## 2017-07-08 NOTE — Telephone Encounter (Signed)
Mr. Hoque is calling about his echocardiogram on today and is in AFIB . Please  call

## 2017-07-08 NOTE — Telephone Encounter (Signed)
Returned call to patient echo results given. 

## 2017-07-09 NOTE — Telephone Encounter (Addendum)
Result note sent to Dr Martinique: Returned call to pt he states that he picked up medications and took them this morning. He states that his weight this morning was 210 on home scale. And HR at the time of taking medication was HR 138. He took BP while on the phone and reading was BP 136/83 HR 111. He states that he has not had an sx, and still has no sx of fatigue, etc..Marland Kitchenpt will continue current treatment and w/wt and HR daily. Any further direction? Pt is also asking if you think that his EF will go back up? Informed pt that we just have to see if EF improves in the future. Please advise

## 2017-07-10 ENCOUNTER — Other Ambulatory Visit: Payer: Self-pay

## 2017-07-10 MED ORDER — METOPROLOL SUCCINATE ER 50 MG PO TB24: ORAL_TABLET | ORAL | 6 refills | Status: DC

## 2017-07-12 ENCOUNTER — Ambulatory Visit: Payer: PPO | Admitting: Physician Assistant

## 2017-07-12 VITALS — HR 114 | Ht 73.0 in | Wt 213.8 lb

## 2017-07-12 DIAGNOSIS — I10 Essential (primary) hypertension: Secondary | ICD-10-CM

## 2017-07-12 DIAGNOSIS — I4892 Unspecified atrial flutter: Secondary | ICD-10-CM | POA: Diagnosis not present

## 2017-07-12 DIAGNOSIS — E785 Hyperlipidemia, unspecified: Secondary | ICD-10-CM

## 2017-07-12 DIAGNOSIS — I25810 Atherosclerosis of coronary artery bypass graft(s) without angina pectoris: Secondary | ICD-10-CM

## 2017-07-12 DIAGNOSIS — I4819 Other persistent atrial fibrillation: Secondary | ICD-10-CM

## 2017-07-12 DIAGNOSIS — I481 Persistent atrial fibrillation: Secondary | ICD-10-CM | POA: Diagnosis not present

## 2017-07-12 MED ORDER — METOPROLOL SUCCINATE ER 50 MG PO TB24
ORAL_TABLET | ORAL | 6 refills | Status: DC
Start: 2017-07-12 — End: 2017-07-17

## 2017-07-12 NOTE — H&P (View-Only) (Signed)
Cardiology Office Note    Date:  07/14/2017   ID:  [name] [name], [name] [name] 06-Dec-1950, MRN [id number]  PCP:  [name], MD  Cardiologist:  [name]  Chief Complaint  Patient presents with  . Follow-up    seen for [name], uncontrolled afib with RVR    History of Present Illness:  [name] [name] is a 66 y.o. male with PMH of HTN, HLD, OSA, atrial flutter and CAD s/p CABG 2005.  He had a normal stress echo in May 2013.  In August 2015 he was treated for atrial flutter with RVR.  He underwent successful DC cardioversion.  He also underwent ablation of atrial flutter and ectopic atrial tachycardia on 03/30/2014 by Dr. [name].  Stress Myoview in 2015 demonstrated good exercise tolerance and normal perfusion.  He also has obstructive sleep apnea on CPAP therapy managed by Dr. Radford Pax.  On recent office visit 07/03/2017, he was noted to have new atrial fibrillation with RVR.  Started on Eliquis.  His Toprol-XL was increased to 50 mg twice daily for better rate control.  Recent echocardiogram obtained on 07/08/2017 showed EF 40-45%, moderate LVH, moderate AI and moderate MR, moderate biatrial dilatation.  Due to uncontrolled heart rate, his Toprol-XL was increased to 100 mg twice daily.  He was also started on Lasix 20 mg daily and potassium supplement as well for weight gain.  He presents today for cardiology office visit, he apparently misunderstood the instruction.  Instead of increasing his Toprol-XL from 50 mg twice daily to 100 mg twice daily, he went straight to 200 mg twice daily Toprol-XL.  Despite this, his heart rate remains uncontrolled.  He does not have any chest pain or palpitation, however he has exertional dyspnea and fatigue.  I am not confident we can rate control his HR as outpatient with increased medication.  After discussing with DOD Dr.Berry, I recommended outpatient TEE cardioversion.  Next Monday, he will begin to decrease his Toprol-XL down to 100 mg twice daily to  avoid post-cardioversion bradycardia.  Otherwise he has not had any bleeding issues on the new Eliquis.  We will see him back in 3 weeks for follow-up.    Past Medical History:  Diagnosis Date  . Chicken pox   . Coronary artery disease    Severe three-vessel coronary artery disease with ejection fraction of 65%  . GERD (gastroesophageal reflux disease)   . History of gout   . History of psoriasis   . Hypercholesterolemia   . Hypertension   . OSA (obstructive sleep apnea)    Moderate with AHI of 16.9/hr now on CPAP at 10cm H2O  . Psoriatic arthritis [facility])     Past Surgical History:  Procedure Laterality Date  . ATRIAL FLUTTER ABLATION  03/30/2014  . ATRIAL FLUTTER ABLATION N/A 03/30/2014   Procedure: ATRIAL FLUTTER ABLATION;  Surgeon: Coralyn Mark, MD;  Location: [facility];  Service: Cardiovascular;  Laterality: N/A;  . CARDIAC CATHETERIZATION  09/2003   Ejection fraction was estimated at 65%.  . CARDIOVERSION N/A 03/15/2014   Procedure: CARDIOVERSION;  Surgeon: Lelon Perla, MD;  Location: [facility];  Service: Cardiovascular;  Laterality: N/A;  . CORONARY ARTERY BYPASS GRAFT  09/2003   [place]-lad,svg-diag,svg-om/distal LCX,svg-pda  . TEE WITHOUT CARDIOVERSION N/A 03/15/2014   Procedure: TRANSESOPHAGEAL ECHOCARDIOGRAM (TEE);  Surgeon: Lelon Perla, MD;  Location: [facility] ENDOSCOPY;  Service: Cardiovascular;  Laterality: N/A;  . TONSILLECTOMY  1950's    Current Medications: Outpatient Medications Prior  to Visit  Medication Sig Dispense Refill  . apixaban (ELIQUIS) 5 MG TABS tablet Take 1 tablet (5 mg total) by mouth 2 (two) times daily. 60 tablet 11  . atorvastatin (LIPITOR) 40 MG tablet TAKE 1 TABLET DAILY. 30 tablet 6  . cholecalciferol (VITAMIN D) 1000 UNITS tablet Take 1,000 Units by mouth daily.      . Coenzyme Q10 (CO Q 10 PO) Take 100 mg by mouth daily.     . Colchicine 0.6 MG CAPS Take by mouth daily as needed.    . furosemide (LASIX) 20 MG tablet Take 1 tablet (20  mg total) by mouth daily. 30 tablet 6  . HYDROcodone-homatropine (HYCODAN) 5-1.5 MG/5ML syrup Take 5 mLs by mouth every 6 (six) hours as needed for up to 10 days. 120 mL 0  . losartan (COZAAR) 50 MG tablet Take 1 tablet (50 mg total) by mouth daily. 90 tablet 3  . multivitamin (THERAGRAN) per tablet Take 1 tablet by mouth daily.      . nitroGLYCERIN (NITROSTAT) 0.4 MG SL tablet PLACE 1 TABLET UNDER THE TONGUE EVERY 5 MINUTES AS NEEDED FOR CHEST PAIN. 25 tablet 0  . NON FORMULARY Borage Oil as directed    . Omega-3 Fatty Acids (FISH OIL CONCENTRATE PO) Take 1,700 mg by mouth daily.      . potassium chloride SA (K-DUR,KLOR-CON) 20 MEQ tablet Take 1 tablet (20 mEq total) by mouth daily. 30 tablet 6  . tadalafil (CIALIS) 20 MG tablet Take one every other day as needed. 6 tablet 11  . metoprolol succinate (TOPROL-XL) 50 MG 24 hr tablet Take 2 tablets ( 100 mg ) twice a day (Patient taking differently: 200 mg 2 (two) times daily. Take 2 tablets ( 100 mg ) twice a day) 120 tablet 6   No facility-administered medications prior to visit.      Allergies:   Patient has no known allergies.   Social History   Socioeconomic History  . Marital status: Married    Spouse name: None  . Number of children: 1  . Years of education: None  . Highest education level: None  Social Needs  . Financial resource strain: None  . Food insecurity - worry: None  . Food insecurity - inability: None  . Transportation needs - medical: None  . Transportation needs - non-medical: None  Occupational History  . Occupation: Print production planner: [employer]  Tobacco Use  . Smoking status: Former Smoker    Packs/day: 0.50    Years: 5.00    Pack years: 2.50    Types: Cigarettes  . Smokeless tobacco: Never Used  . Tobacco comment: "quit smoking cigarettes in the late 1970's"  Substance and Sexual Activity  . Alcohol use: Yes    Alcohol/week: 2.4 oz    Types: 2 Cans of beer, 2 Shots of liquor per  week  . Drug use: No  . Sexual activity: Yes  Other Topics Concern  . None  Social History Narrative  . None     Family History:  The patient's family history includes Cancer in his mother; Heart disease in his mother; Stroke in his mother.   ROS:   Please see the history of present illness.    ROS All other systems reviewed and are negative.   PHYSICAL EXAM:   VS:  Pulse (!) 114   Ht 6\' 1"  (1.854 m)   Wt 213 lb 12.8 oz (97 kg)   BMI 28.21 kg/m  GEN: Well nourished, well developed, in no acute distress  HEENT: normal  Neck: no JVD, carotid bruits, or masses Cardiac: Irregularly irregular; no murmurs, rubs, or gallops,no edema  Respiratory:  clear to auscultation bilaterally, normal work of breathing GI: soft, nontender, nondistended, + BS MS: no deformity or atrophy  Skin: warm and dry, no rash Neuro:  Alert and Oriented x 3, Strength and sensation are intact Psych: euthymic mood, full affect  Wt Readings from Last 3 Encounters:  07/12/17 213 lb 12.8 oz (97 kg)  07/05/17 217 lb 4.8 oz (98.6 kg)  07/03/17 215 lb 4 oz (97.6 kg)      Studies/Labs Reviewed:   EKG:  EKG is ordered today.  The ekg ordered today demonstrates afib with RVR, HR 114  Recent Labs: 07/04/2017: ALT 36; BNP 421.5; BUN 9; Creatinine, Ser 1.01; Hemoglobin 14.8; Platelets 204; Potassium 4.1; Sodium 140; TSH 0.956   Lipid Panel    Component Value Date/Time   CHOL 153 02/14/2017 0000   TRIG 65 02/14/2017 0000   HDL 71 02/14/2017 0000   CHOLHDL 2.2 02/14/2017 0000   CHOLHDL 3 11/23/2015 0837   VLDL 16.4 11/23/2015 0837   LDLCALC 69 02/14/2017 0000    Additional studies/ records that were reviewed today include:   Echo 07/08/2017 LV EF: 40% -   45%  Study Conclusions  - Left ventricle: The cavity size was normal. Wall thickness was   increased in a pattern of moderate LVH. Systolic function was   mildly to moderately reduced. The estimated ejection fraction was   in the range of 40%  to 45%. - Aortic valve: There was moderate regurgitation. - Mitral valve: There was moderate regurgitation. - Left atrium: The atrium was moderately dilated. - Right atrium: The atrium was moderately dilated.   ASSESSMENT:    1. Persistent atrial fibrillation ([name] [name])   2. Atrial flutter, unspecified type ([place])   3. Essential hypertension   4. Hyperlipidemia, unspecified hyperlipidemia type   5. Coronary artery disease involving coronary bypass graft of native heart without angina pectoris      PLAN:  In order of problems listed above:  1. Persistent atrial fibrillation with RVR: Although he was instructed to increase Toprol-XL from 50 mg twice daily to 100 mg twice daily, he misunderstood the instruction at the increased his Toprol-XL to 200 mg twice daily.  Fortunately his blood pressure is fairly stable.  Despite the high dose of Toprol-XL, this is not controlling his heart rate.  I am not confident we can control his heart rate as outpatient.  Has only been on Eliquis since 07/03/2017, I recommended outpatient TEE DCCV.  Risk and benefit of the procedure has been discussed with the patient including 1:10000 (hypotention, bradycardia, aspiration event, esophageal bleeding/hematoma or event perforation).  -Decrease Toprol-XL to 100 mg twice daily starting next Monday to avoid significant post cardioversion bradycardia  2. LV dysfunction: Trace amount of ankle edema, otherwise no significant volume overload.  Continue on Toprol-XL and losartan.  After cardioversion, will consider repeat echocardiogram in 3 months.    3. Atrial flutter s/p ablation: No recurrence  4. CAD s/p CABG: No angina, not on aspirin due to the need for Eliquis.  5. Hypertension: Blood pressure borderline, he is actually taking 200 mg twice daily of Toprol-XL.  Will decrease Toprol-XL to 100 mg twice daily starting next Monday to avoid significant post cardioversion bradycardia.  6. Hyperlipidemia: Continue Lipitor  40 mg daily   Medication Adjustments/Labs and Tests Ordered:  Current medicines are reviewed at length with the patient today.  Concerns regarding medicines are outlined above.  Medication changes, Labs and Tests ordered today are listed in the Patient Instructions below. Patient Instructions  Medication Instructions:   STARTING on the afternoon of Monday, 07/15/17, reduce your metoprolol dose to 100mg  TWICE DAILY.  This is the only change to your medication regimen at this time - Continue other medications as prescribed.  Labwork:   none  Testing/Procedures:  Your physician has requested that you have a TEE. During a TEE, sound waves are used to create images of your heart. It provides your doctor with information about the size and shape of your heart and how well your heart's chambers and valves are working. In this test, a transducer is attached to the end of a flexible tube that's guided down your throat and into your esophagus (the tube leading from you mouth to your stomach) to get a more detailed image of your heart. You are not awake for the procedure. Please see the instruction sheet given to you today. For further information please visit [email or web address].   You are scheduled for a Cardioversion/TEE Cardioversion on 12/26 with Dr. Radford Pax.  Please arrive at the [facility] (Main Entrance A) at [facility]: [address] [place], [place] [address] at 9:30 am. (1.5 hours prior to procedure)  DIET: Nothing to eat or drink after midnight except a sip of water with medications (see medication instructions below)  Medication Instructions: HOLD lasix and potassium morning of procedure. Take all other scheduled AM medications.   You will need to continue your anticoagulant after your procedure until you are told by your  Provider that it is safe to stop   You must have a responsible person to drive you home and stay in the waiting area during your procedure. Failure to do  so could result in cancellation. Bring your insurance cards. *Special Note: Every effort is made to have your procedure done on time. Occasionally there are emergencies that occur at the hospital that may cause delays. Please be patient if a delay does occur.    Follow-Up:  With Almyra Deforest PA in 3 weeks  If you need a refill on your cardiac medications before your next appointment, please call your pharmacy.      Hilbert Corrigan, [place]  07/14/2017 1:05 PM    [facility] [place], [place],   [address] Phone: [phone]; Fax: [phone]

## 2017-07-12 NOTE — Progress Notes (Signed)
Cardiology Office Note    Date:  07/14/2017   ID:  [name], [name] 1951/07/22, MRN [id number]  PCP:  [name], MD  Cardiologist:  [name]  Chief Complaint  Patient presents with  . Follow-up    seen for [name], uncontrolled afib with RVR    History of Present Illness:  [name] [name] is a 66 y.o. male with PMH of HTN, HLD, OSA, atrial flutter and CAD s/p CABG 2005.  He had a normal stress echo in May 2013.  In August 2015 he was treated for atrial flutter with RVR.  He underwent successful DC cardioversion.  He also underwent ablation of atrial flutter and ectopic atrial tachycardia on 03/30/2014 by Dr. [name].  Stress Myoview in 2015 demonstrated good exercise tolerance and normal perfusion.  He also has obstructive sleep apnea on CPAP therapy managed by Dr. Radford Pax.  On recent office visit 07/03/2017, he was noted to have new atrial fibrillation with RVR.  Started on Eliquis.  His Toprol-XL was increased to 50 mg twice daily for better rate control.  Recent echocardiogram obtained on 07/08/2017 showed EF 40-45%, moderate LVH, moderate AI and moderate MR, moderate biatrial dilatation.  Due to uncontrolled heart rate, his Toprol-XL was increased to 100 mg twice daily.  He was also started on Lasix 20 mg daily and potassium supplement as well for weight gain.  He presents today for cardiology office visit, he apparently misunderstood the instruction.  Instead of increasing his Toprol-XL from 50 mg twice daily to 100 mg twice daily, he went straight to 200 mg twice daily Toprol-XL.  Despite this, his heart rate remains uncontrolled.  He does not have any chest pain or palpitation, however he has exertional dyspnea and fatigue.  I am not confident we can rate control his HR as outpatient with increased medication.  After discussing with DOD Dr.Berry, I recommended outpatient TEE cardioversion.  Next Monday, he will begin to decrease his Toprol-XL down to 100 mg twice daily to  avoid post-cardioversion bradycardia.  Otherwise he has not had any bleeding issues on the new Eliquis.  We will see him back in 3 weeks for follow-up.    Past Medical History:  Diagnosis Date  . Chicken pox   . Coronary artery disease    Severe three-vessel coronary artery disease with ejection fraction of 65%  . GERD (gastroesophageal reflux disease)   . History of gout   . History of psoriasis   . Hypercholesterolemia   . Hypertension   . OSA (obstructive sleep apnea)    Moderate with AHI of 16.9/hr now on CPAP at 10cm H2O  . Psoriatic arthritis [facility])     Past Surgical History:  Procedure Laterality Date  . ATRIAL FLUTTER ABLATION  03/30/2014  . ATRIAL FLUTTER ABLATION N/A 03/30/2014   Procedure: ATRIAL FLUTTER ABLATION;  Surgeon: Coralyn Mark, MD;  Location: [facility] CATH LAB;  Service: Cardiovascular;  Laterality: N/A;  . CARDIAC CATHETERIZATION  09/2003   Ejection fraction was estimated at 65%.  . CARDIOVERSION N/A 03/15/2014   Procedure: CARDIOVERSION;  Surgeon: Lelon Perla, MD;  Location: [facility];  Service: Cardiovascular;  Laterality: N/A;  . CORONARY ARTERY BYPASS GRAFT  09/2003   [place]-lad,svg-diag,svg-om/distal LCX,svg-pda  . TEE WITHOUT CARDIOVERSION N/A 03/15/2014   Procedure: TRANSESOPHAGEAL ECHOCARDIOGRAM (TEE);  Surgeon: Lelon Perla, MD;  Location: [facility] ENDOSCOPY;  Service: Cardiovascular;  Laterality: N/A;  . TONSILLECTOMY  1950's    Current Medications: Outpatient Medications Prior  to Visit  Medication Sig Dispense Refill  . apixaban (ELIQUIS) 5 MG TABS tablet Take 1 tablet (5 mg total) by mouth 2 (two) times daily. 60 tablet 11  . atorvastatin (LIPITOR) 40 MG tablet TAKE 1 TABLET DAILY. 30 tablet 6  . cholecalciferol (VITAMIN D) 1000 UNITS tablet Take 1,000 Units by mouth daily.      . Coenzyme Q10 (CO Q 10 PO) Take 100 mg by mouth daily.     . Colchicine 0.6 MG CAPS Take by mouth daily as needed.    . furosemide (LASIX) 20 MG tablet Take 1 tablet (20  mg total) by mouth daily. 30 tablet 6  . HYDROcodone-homatropine (HYCODAN) 5-1.5 MG/5ML syrup Take 5 mLs by mouth every 6 (six) hours as needed for up to 10 days. 120 mL 0  . losartan (COZAAR) 50 MG tablet Take 1 tablet (50 mg total) by mouth daily. 90 tablet 3  . multivitamin (THERAGRAN) per tablet Take 1 tablet by mouth daily.      . nitroGLYCERIN (NITROSTAT) 0.4 MG SL tablet PLACE 1 TABLET UNDER THE TONGUE EVERY 5 MINUTES AS NEEDED FOR CHEST PAIN. 25 tablet 0  . NON FORMULARY Borage Oil as directed    . Omega-3 Fatty Acids (FISH OIL CONCENTRATE PO) Take 1,700 mg by mouth daily.      . potassium chloride SA (K-DUR,KLOR-CON) 20 MEQ tablet Take 1 tablet (20 mEq total) by mouth daily. 30 tablet 6  . tadalafil (CIALIS) 20 MG tablet Take one every other day as needed. 6 tablet 11  . metoprolol succinate (TOPROL-XL) 50 MG 24 hr tablet Take 2 tablets ( 100 mg ) twice a day (Patient taking differently: 200 mg 2 (two) times daily. Take 2 tablets ( 100 mg ) twice a day) 120 tablet 6   No facility-administered medications prior to visit.      Allergies:   Patient has no known allergies.   Social History   Socioeconomic History  . Marital status: Married    Spouse name: None  . Number of children: 1  . Years of education: None  . Highest education level: None  Social Needs  . Financial resource strain: None  . Food insecurity - worry: None  . Food insecurity - inability: None  . Transportation needs - medical: None  . Transportation needs - non-medical: None  Occupational History  . Occupation: Print production planner: [employer]  Tobacco Use  . Smoking status: Former Smoker    Packs/day: 0.50    Years: 5.00    Pack years: 2.50    Types: Cigarettes  . Smokeless tobacco: Never Used  . Tobacco comment: "quit smoking cigarettes in the late 1970's"  Substance and Sexual Activity  . Alcohol use: Yes    Alcohol/week: 2.4 oz    Types: 2 Cans of beer, 2 Shots of liquor per  week  . Drug use: No  . Sexual activity: Yes  Other Topics Concern  . None  Social History Narrative  . None     Family History:  The patient's family history includes Cancer in his mother; Heart disease in his mother; Stroke in his mother.   ROS:   Please see the history of present illness.    ROS All other systems reviewed and are negative.   PHYSICAL EXAM:   VS:  Pulse (!) 114   Ht 6\' 1"  (1.854 m)   Wt 213 lb 12.8 oz (97 kg)   BMI 28.21 kg/m  GEN: Well nourished, well developed, in no acute distress  HEENT: normal  Neck: no JVD, carotid bruits, or masses Cardiac: Irregularly irregular; no murmurs, rubs, or gallops,no edema  Respiratory:  clear to auscultation bilaterally, normal work of breathing GI: soft, nontender, nondistended, + BS MS: no deformity or atrophy  Skin: warm and dry, no rash Neuro:  Alert and Oriented x 3, Strength and sensation are intact Psych: euthymic mood, full affect  Wt Readings from Last 3 Encounters:  07/12/17 213 lb 12.8 oz (97 kg)  07/05/17 217 lb 4.8 oz (98.6 kg)  07/03/17 215 lb 4 oz (97.6 kg)      Studies/Labs Reviewed:   EKG:  EKG is ordered today.  The ekg ordered today demonstrates afib with RVR, HR 114  Recent Labs: 07/04/2017: ALT 36; BNP 421.5; BUN 9; Creatinine, Ser 1.01; Hemoglobin 14.8; Platelets 204; Potassium 4.1; Sodium 140; TSH 0.956   Lipid Panel    Component Value Date/Time   CHOL 153 02/14/2017 0000   TRIG 65 02/14/2017 0000   HDL 71 02/14/2017 0000   CHOLHDL 2.2 02/14/2017 0000   CHOLHDL 3 11/23/2015 0837   VLDL 16.4 11/23/2015 0837   LDLCALC 69 02/14/2017 0000    Additional studies/ records that were reviewed today include:   Echo 07/08/2017 LV EF: 40% -   45%  Study Conclusions  - Left ventricle: The cavity size was normal. Wall thickness was   increased in a pattern of moderate LVH. Systolic function was   mildly to moderately reduced. The estimated ejection fraction was   in the range of 40%  to 45%. - Aortic valve: There was moderate regurgitation. - Mitral valve: There was moderate regurgitation. - Left atrium: The atrium was moderately dilated. - Right atrium: The atrium was moderately dilated.   ASSESSMENT:    1. Persistent atrial fibrillation ([place])   2. Atrial flutter, unspecified type ([place])   3. Essential hypertension   4. Hyperlipidemia, unspecified hyperlipidemia type   5. Coronary artery disease involving coronary bypass graft of native heart without angina pectoris      PLAN:  In order of problems listed above:  1. Persistent atrial fibrillation with RVR: Although he was instructed to increase Toprol-XL from 50 mg twice daily to 100 mg twice daily, he misunderstood the instruction at the increased his Toprol-XL to 200 mg twice daily.  Fortunately his blood pressure is fairly stable.  Despite the high dose of Toprol-XL, this is not controlling his heart rate.  I am not confident we can control his heart rate as outpatient.  Has only been on Eliquis since 07/03/2017, I recommended outpatient TEE DCCV.  Risk and benefit of the procedure has been discussed with the patient including 1:10000 (hypotention, bradycardia, aspiration event, esophageal bleeding/hematoma or event perforation).  -Decrease Toprol-XL to 100 mg twice daily starting next Monday to avoid significant post cardioversion bradycardia  2. LV dysfunction: Trace amount of ankle edema, otherwise no significant volume overload.  Continue on Toprol-XL and losartan.  After cardioversion, will consider repeat echocardiogram in 3 months.    3. Atrial flutter s/p ablation: No recurrence  4. CAD s/p CABG: No angina, not on aspirin due to the need for Eliquis.  5. Hypertension: Blood pressure borderline, he is actually taking 200 mg twice daily of Toprol-XL.  Will decrease Toprol-XL to 100 mg twice daily starting next Monday to avoid significant post cardioversion bradycardia.  6. Hyperlipidemia: Continue Lipitor  40 mg daily   Medication Adjustments/Labs and Tests Ordered:  Current medicines are reviewed at length with the patient today.  Concerns regarding medicines are outlined above.  Medication changes, Labs and Tests ordered today are listed in the Patient Instructions below. Patient Instructions  Medication Instructions:   STARTING on the afternoon of Monday, 07/15/17, reduce your metoprolol dose to 100mg  TWICE DAILY.  This is the only change to your medication regimen at this time - Continue other medications as prescribed.  Labwork:   none  Testing/Procedures:  Your physician has requested that you have a TEE. During a TEE, sound waves are used to create images of your heart. It provides your doctor with information about the size and shape of your heart and how well your heart's chambers and valves are working. In this test, a transducer is attached to the end of a flexible tube that's guided down your throat and into your esophagus (the tube leading from you mouth to your stomach) to get a more detailed image of your heart. You are not awake for the procedure. Please see the instruction sheet given to you today. For further information please visit [email or web address].   You are scheduled for a Cardioversion/TEE Cardioversion on 12/26 with Dr. Radford Pax.  Please arrive at the [facility] (Main Entrance A) at [facility]: [address] [place], [place] [address] at 9:30 am. (1.5 hours prior to procedure)  DIET: Nothing to eat or drink after midnight except a sip of water with medications (see medication instructions below)  Medication Instructions: HOLD lasix and potassium morning of procedure. Take all other scheduled AM medications.   You will need to continue your anticoagulant after your procedure until you are told by your  Provider that it is safe to stop   You must have a responsible person to drive you home and stay in the waiting area during your procedure. Failure to do  so could result in cancellation. Bring your insurance cards. *Special Note: Every effort is made to have your procedure done on time. Occasionally there are emergencies that occur at the hospital that may cause delays. Please be patient if a delay does occur.    Follow-Up:  With Almyra Deforest PA in 3 weeks  If you need a refill on your cardiac medications before your next appointment, please call your pharmacy.      Hilbert Corrigan, [place]  07/14/2017 1:05 PM    [facility] [place], [place], [place]  [address] Phone: [phone]; Fax: [phone]

## 2017-07-12 NOTE — Patient Instructions (Addendum)
Medication Instructions:   STARTING on the afternoon of Monday, 07/15/17, reduce your metoprolol dose to 100mg  TWICE DAILY.  This is the only change to your medication regimen at this time - Continue other medications as prescribed.  Labwork:   none  Testing/Procedures:  Your physician has requested that you have a TEE. During a TEE, sound waves are used to create images of your heart. It provides your doctor with information about the size and shape of your heart and how well your heart's chambers and valves are working. In this test, a transducer is attached to the end of a flexible tube that's guided down your throat and into your esophagus (the tube leading from you mouth to your stomach) to get a more detailed image of your heart. You are not awake for the procedure. Please see the instruction sheet given to you today. For further information please visit [email or web address].   You are scheduled for a Cardioversion/TEE Cardioversion on 12/26 with Dr. Radford Pax.  Please arrive at the [facility] (Main Entrance A) at [facility]: [address] [place], [place] [address] at 9:30 am. (1.5 hours prior to procedure)  DIET: Nothing to eat or drink after midnight except a sip of water with medications (see medication instructions below)  Medication Instructions: HOLD lasix and potassium morning of procedure. Take all other scheduled AM medications.   You will need to continue your anticoagulant after your procedure until you are told by your  Provider that it is safe to stop   You must have a responsible person to drive you home and stay in the waiting area during your procedure. Failure to do so could result in cancellation. Bring your insurance cards. *Special Note: Every effort is made to have your procedure done on time. Occasionally there are emergencies that occur at the hospital that may cause delays. Please be patient if a delay does occur.    Follow-Up:  With Almyra Deforest PA  in 3 weeks  If you need a refill on your cardiac medications before your next appointment, please call your pharmacy.

## 2017-07-14 ENCOUNTER — Other Ambulatory Visit: Payer: Self-pay | Admitting: Physician Assistant

## 2017-07-14 ENCOUNTER — Encounter: Payer: Self-pay | Admitting: Physician Assistant

## 2017-07-17 ENCOUNTER — Ambulatory Visit (HOSPITAL_BASED_OUTPATIENT_CLINIC_OR_DEPARTMENT_OTHER)
Admission: RE | Admit: 2017-07-17 | Discharge: 2017-07-17 | Disposition: A | Discharge status: Home / Self Care | Payer: PPO | Source: Ambulatory Visit | Attending: Physician Assistant | Admitting: Physician Assistant

## 2017-07-17 ENCOUNTER — Ambulatory Visit (HOSPITAL_COMMUNITY): Payer: PPO | Admitting: Certified Registered Nurse Anesthetist

## 2017-07-17 ENCOUNTER — Other Ambulatory Visit: Payer: Self-pay

## 2017-07-17 ENCOUNTER — Ambulatory Visit (HOSPITAL_COMMUNITY)
Admission: RE | Admit: 2017-07-17 | Discharge: 2017-07-17 | Disposition: A | Discharge status: Home / Self Care | Payer: PPO | Source: Ambulatory Visit | Attending: Cardiology | Admitting: Cardiology

## 2017-07-17 ENCOUNTER — Encounter (HOSPITAL_COMMUNITY): Payer: Self-pay | Admitting: *Deleted

## 2017-07-17 ENCOUNTER — Encounter (HOSPITAL_COMMUNITY)
Admission: RE | Disposition: A | Discharge status: Home / Self Care | Payer: Self-pay | Source: Ambulatory Visit | Attending: Cardiology

## 2017-07-17 DIAGNOSIS — Z951 Presence of aortocoronary bypass graft: Secondary | ICD-10-CM | POA: Insufficient documentation

## 2017-07-17 DIAGNOSIS — M109 Gout, unspecified: Secondary | ICD-10-CM | POA: Diagnosis not present

## 2017-07-17 DIAGNOSIS — Z87891 Personal history of nicotine dependence: Secondary | ICD-10-CM | POA: Diagnosis not present

## 2017-07-17 DIAGNOSIS — I340 Nonrheumatic mitral (valve) insufficiency: Secondary | ICD-10-CM | POA: Diagnosis not present

## 2017-07-17 DIAGNOSIS — I083 Combined rheumatic disorders of mitral, aortic and tricuspid valves: Secondary | ICD-10-CM | POA: Diagnosis not present

## 2017-07-17 DIAGNOSIS — I481 Persistent atrial fibrillation: Secondary | ICD-10-CM | POA: Insufficient documentation

## 2017-07-17 DIAGNOSIS — Z79899 Other long term (current) drug therapy: Secondary | ICD-10-CM | POA: Insufficient documentation

## 2017-07-17 DIAGNOSIS — I4819 Other persistent atrial fibrillation: Secondary | ICD-10-CM

## 2017-07-17 DIAGNOSIS — I2510 Atherosclerotic heart disease of native coronary artery without angina pectoris: Secondary | ICD-10-CM | POA: Insufficient documentation

## 2017-07-17 DIAGNOSIS — L4050 Arthropathic psoriasis, unspecified: Secondary | ICD-10-CM | POA: Diagnosis not present

## 2017-07-17 DIAGNOSIS — E7800 Pure hypercholesterolemia, unspecified: Secondary | ICD-10-CM | POA: Insufficient documentation

## 2017-07-17 DIAGNOSIS — G4733 Obstructive sleep apnea (adult) (pediatric): Secondary | ICD-10-CM | POA: Insufficient documentation

## 2017-07-17 DIAGNOSIS — K219 Gastro-esophageal reflux disease without esophagitis: Secondary | ICD-10-CM | POA: Insufficient documentation

## 2017-07-17 DIAGNOSIS — Z7901 Long term (current) use of anticoagulants: Secondary | ICD-10-CM | POA: Insufficient documentation

## 2017-07-17 DIAGNOSIS — I4892 Unspecified atrial flutter: Secondary | ICD-10-CM | POA: Insufficient documentation

## 2017-07-17 DIAGNOSIS — I10 Essential (primary) hypertension: Secondary | ICD-10-CM | POA: Diagnosis not present

## 2017-07-17 HISTORY — PX: CARDIOVERSION: SHX1299

## 2017-07-17 HISTORY — PX: TEE WITHOUT CARDIOVERSION: SHX5443

## 2017-07-17 SURGERY — ECHOCARDIOGRAM, TRANSESOPHAGEAL
Anesthesia: Monitor Anesthesia Care

## 2017-07-17 MED ORDER — PROPOFOL 500 MG/50ML IV EMUL
INTRAVENOUS | Status: DC | PRN
  Administered 2017-07-17: 100 ug/kg/min via INTRAVENOUS

## 2017-07-17 MED ORDER — SODIUM CHLORIDE 0.9 % IV SOLN
INTRAVENOUS | Status: DC
  Administered 2017-07-17: 10:00:00 via INTRAVENOUS

## 2017-07-17 MED ORDER — PHENYLEPHRINE 40 MCG/ML (10ML) SYRINGE FOR IV PUSH (FOR BLOOD PRESSURE SUPPORT)
PREFILLED_SYRINGE | INTRAVENOUS | Status: DC | PRN
  Administered 2017-07-17: 80 ug via INTRAVENOUS

## 2017-07-17 MED ORDER — LIDOCAINE 2% (20 MG/ML) 5 ML SYRINGE
INTRAMUSCULAR | Status: DC | PRN
  Administered 2017-07-17: 60 mg via INTRAVENOUS

## 2017-07-17 MED ORDER — PROPOFOL 10 MG/ML IV BOLUS
INTRAVENOUS | Status: DC | PRN
  Administered 2017-07-17: 10 mg via INTRAVENOUS
  Administered 2017-07-17 (×2): 20 mg via INTRAVENOUS

## 2017-07-17 MED ORDER — METOPROLOL SUCCINATE ER 50 MG PO TB24: 50.0000 mg | ORAL_TABLET | Freq: Two times a day (BID) | ORAL | 11 refills | Status: DC

## 2017-07-17 NOTE — Transfer of Care (Signed)
Immediate Anesthesia Transfer of Care Note  Patient: [name] [name]  Procedure(s) Performed: TRANSESOPHAGEAL ECHOCARDIOGRAM (TEE) (N/A ) CARDIOVERSION (N/A )  Patient Location: Endoscopy Unit  Anesthesia Type:MAC  Level of Consciousness: awake, alert  and oriented  Airway & Oxygen Therapy: Patient Spontanous Breathing and Patient connected to nasal cannula oxygen  Post-op Assessment: Report given to RN and Post -op Vital signs reviewed and stable  Post vital signs: Reviewed and stable  Last Vitals:  Vitals:   07/17/17 1000  BP: 112/68  Pulse: (!) 102  Resp: 20  Temp: 36.6 C  SpO2: 98%    Last Pain:  Vitals:   07/17/17 1000  TempSrc: Oral         Complications: No apparent anesthesia complications

## 2017-07-17 NOTE — Anesthesia Postprocedure Evaluation (Signed)
Anesthesia Post Note  Patient: Townsend Cudworth Suchy  Procedure(s) Performed: TRANSESOPHAGEAL ECHOCARDIOGRAM (TEE) (N/A ) CARDIOVERSION (N/A )     Patient location during evaluation: Endoscopy Anesthesia Type: MAC Level of consciousness: awake and alert Pain management: pain level controlled Vital Signs Assessment: post-procedure vital signs reviewed and stable Respiratory status: spontaneous breathing, nonlabored ventilation, respiratory function stable and patient connected to nasal cannula oxygen Cardiovascular status: stable and blood pressure returned to baseline Postop Assessment: no apparent nausea or vomiting Anesthetic complications: no    Last Vitals:  Vitals:   07/17/17 1255 07/17/17 1300  BP: 100/66 (!) 100/44  Pulse: (!) 59   Resp: (!) 24   Temp:    SpO2: 99%     Last Pain:  Vitals:   07/17/17 1148  TempSrc: Oral                 Edmonia Gonser,JAMES TERRILL

## 2017-07-17 NOTE — Anesthesia Procedure Notes (Signed)
Procedure Name: MAC Date/Time: 07/17/2017 11:12 AM Performed by: Candis Shine, CRNA Pre-anesthesia Checklist: Patient identified, Emergency Drugs available, Suction available and Patient being monitored Patient Re-evaluated:Patient Re-evaluated prior to induction Oxygen Delivery Method: Nasal cannula Dental Injury: Teeth and Oropharynx as per pre-operative assessment

## 2017-07-17 NOTE — Anesthesia Preprocedure Evaluation (Addendum)
Anesthesia Evaluation  Patient identified by MRN, date of birth, ID band  Airway Mallampati: II   Neck ROM: Full    Dental no notable dental hx.    Pulmonary former smoker,    breath sounds clear to auscultation       Cardiovascular hypertension, + CAD and + CABG  + dysrhythmias  Rhythm:Irregular Rate:Normal     Neuro/Psych    GI/Hepatic Neg liver ROS,   Endo/Other  negative endocrine ROS  Renal/GU negative Renal ROS     Musculoskeletal   Abdominal   Peds  Hematology negative hematology ROS (+)   Anesthesia Other Findings   Reproductive/Obstetrics                           Anesthesia Physical Anesthesia Plan  ASA: III  Anesthesia Plan: MAC   Post-op Pain Management:    Induction: Intravenous  PONV Risk Score and Plan: 2 and Treatment may vary due to age or medical condition  Airway Management Planned: Natural Airway and Nasal Cannula  Additional Equipment:   Intra-op Plan:   Post-operative Plan:   Informed Consent: I have reviewed the patients History and Physical, chart, labs and discussed the procedure including the risks, benefits and alternatives for the proposed anesthesia with the patient or authorized representative who has indicated his/her understanding and acceptance.     Plan Discussed with: CRNA  Anesthesia Plan Comments:         Anesthesia Quick Evaluation

## 2017-07-17 NOTE — CV Procedure (Signed)
    PROCEDURE NOTE:  Procedure:  Transesophageal echocardiogram Operator:  Fransico Him, MD Indications:  Atrial fibrillation Complications: None  During this procedure the patient is administered a total of 240mg  of Propofol to achieve and maintain moderate conscious sedation.  The patient's heart rate, blood pressure, and oxygen saturation are monitored continuously during the procedure by anesthesia.   Results: Normal LV size with mildly reduced LVF with EF 40-45% Normal RV size and function Moderately dilated RA Moderately dilated LA with large multilobed LAA with no evidence of LA or LAA thrombus.  Emptying velocity was reduced. Normal TV with moderate TR Normal PV with mild PR Normal MV with moderate to severe MR with no evidence of flow reversal in pulmonary vein.  There were multiple MR jets and PISA could not be obtained. Mild to moderately thickened trileaflet AV with moderate central AR There is a small PFO by colorflow doppler and agitated saline contrast injection Mild atherosclerosis of the thoracic and ascending aorta.  The patient tolerated the procedure well and went on to DCCV.  Signed: Fransico Him, MD [facility]    Electrical Cardioversion Procedure Note TAITE SCHOEPPNER [id number] 1950-10-12  Procedure: Electrical Cardioversion Indications:  Atrial Fibrillation  Time Out: Verified patient identification, verified procedure,medications/allergies/relevent history reviewed, required imaging and test results available.  Performed  Procedure Details  The patient was NPO after midnight. Anesthesia was administered at the beside  by Dr.Rose.  Cardioversion was done with synchronized biphasic defibrillation with AP pads with 150watts.  The patient converted to sinus bradycardia rhythm. The patient tolerated the procedure well   IMPRESSION:  Successful cardioversion of atrial fibrillation    Trenda Corliss 07/17/2017, 10:39 AM

## 2017-07-17 NOTE — Interval H&P Note (Signed)
History and Physical Interval Note:  07/17/2017 10:38 AM  [name] [name]  has presented today for surgery, with the diagnosis of AFIB  The various methods of treatment have been discussed with the patient and family. After consideration of risks, benefits and other options for treatment, the patient has consented to  Procedure(s): TRANSESOPHAGEAL ECHOCARDIOGRAM (TEE) (N/A) CARDIOVERSION (N/A) as a surgical intervention .  The patient's history has been reviewed, patient examined, no change in status, stable for surgery.  I have reviewed the patient's chart and labs.  Questions were answered to the patient's satisfaction.     Fransico Him

## 2017-07-17 NOTE — Discharge Instructions (Signed)
TEE ° °YOU HAD AN CARDIAC PROCEDURE TODAY: Refer to the procedure report and other information in the discharge instructions given to you for any specific questions about what was found during the examination. If this information does not answer your questions, please call [facility] office at [phone] to clarify.  ° °DIET: Your first meal following the procedure should be a light meal and then it is ok to progress to your normal diet. A half-sandwich or bowl of soup is an example of a good first meal. Heavy or fried foods are harder to digest and may make you feel nauseous or bloated. Drink plenty of fluids but you should avoid alcoholic beverages for 24 hours. If you had a esophageal dilation, please see attached instructions for diet.  ° °ACTIVITY: Your care partner should take you home directly after the procedure. You should plan to take it easy, moving slowly for the rest of the day. You can resume normal activity the day after the procedure however YOU SHOULD NOT DRIVE, use power tools, machinery or perform tasks that involve climbing or major physical exertion for 24 hours (because of the sedation medicines used during the test).  ° °SYMPTOMS TO REPORT IMMEDIATELY: °A cardiologist can be reached at any hour. Please call [phone] for any of the following symptoms:  °Vomiting of blood or coffee ground material  °New, significant abdominal pain  °New, significant chest pain or pain under the shoulder blades  °Painful or persistently difficult swallowing  °New shortness of breath  °Black, tarry-looking or red, bloody stools ° °FOLLOW UP:  °Please also call with any specific questions about appointments or follow up tests. ° °Electrical Cardioversion, Care After °This sheet gives you information about how to care for yourself after your procedure. Your health care provider may also give you more specific instructions. If you have problems or questions, contact your health care provider. °What can I  expect after the procedure? °After the procedure, it is common to have: °· Some redness on the skin where the shocks were given. ° °Follow these instructions at home: °· Do not drive for 24 hours if you were given a medicine to help you relax (sedative). °· Take over-the-counter and prescription medicines only as told by your health care provider. °· Ask your health care provider how to check your pulse. Check it often. °· Rest for 48 hours after the procedure or as told by your health care provider. °· Avoid or limit your caffeine use as told by your health care provider. °Contact a health care provider if: °· You feel like your heart is beating too quickly or your pulse is not regular. °· You have a serious muscle cramp that does not go away. °Get help right away if: °· You have discomfort in your chest. °· You are dizzy or you feel faint. °· You have trouble breathing or you are short of breath. °· Your speech is slurred. °· You have trouble moving an arm or leg on one side of your body. °· Your fingers or toes turn cold or blue. °This information is not intended to replace advice given to you by your health care provider. Make sure you discuss any questions you have with your health care provider. °Document Released: 04/29/2013 Document Revised: 02/10/2016 Document Reviewed: 01/13/2016 °Elsevier Interactive Patient Education © 2018 [employer] ° °

## 2017-07-17 NOTE — Progress Notes (Signed)
  Echocardiogram Echocardiogram Transesophageal has been performed.  Bobbye Charleston 07/17/2017, 11:40 AM

## 2017-07-22 ENCOUNTER — Telehealth: Payer: Self-pay | Admitting: Physician Assistant

## 2017-07-22 DIAGNOSIS — Z8739 Personal history of other diseases of the musculoskeletal system and connective tissue: Secondary | ICD-10-CM | POA: Insufficient documentation

## 2017-07-22 DIAGNOSIS — L4050 Arthropathic psoriasis, unspecified: Secondary | ICD-10-CM | POA: Insufficient documentation

## 2017-07-22 DIAGNOSIS — K219 Gastro-esophageal reflux disease without esophagitis: Secondary | ICD-10-CM | POA: Insufficient documentation

## 2017-07-22 DIAGNOSIS — Z872 Personal history of diseases of the skin and subcutaneous tissue: Secondary | ICD-10-CM | POA: Insufficient documentation

## 2017-07-22 DIAGNOSIS — B019 Varicella without complication: Secondary | ICD-10-CM | POA: Insufficient documentation

## 2017-07-22 NOTE — Telephone Encounter (Signed)
Thanks . I agree with that advice.  Dr [name]

## 2017-07-22 NOTE — Telephone Encounter (Signed)
Pt of [name] Recent OV 12/21 w [name] [name], had TEE on 12/26. Weight gain over last few days.  He reports steady 4-5 lb gain in last 3 days. His current weight is 214.8 lbs, up from 210 last week.  Reports doing well o/w. Denies SOB, new fatigue, chest pain. He does report getting short of breath climbing stairs, but not while walking outside house or when lying down in bed. No BP or HR readings available at time of call.  He returns for f/u OV w [name] [name] on 1/10, will also follow up w [name] [name] and [name] this month.  He's on lasix 20mg  daily. I've asked him to go ahead and increase this to 20mg  BID for next 2 days (to take 2nd dose around 1-2pm) and continue to record his weights. He knows and has been reminded to avoid salt, use his compression stockings, and keep his legs elevated when seated.  I have informed pt I would route to provider for recommendations and f/u w him, but to call office w his weights if he hasn't heard from [place] by mid-day Wednesday. Pt expressed understanding and thanks.

## 2017-07-22 NOTE — Telephone Encounter (Signed)
Pt c/o swelling: STAT is pt has developed SOB within 24 hours  1) How much weight have you gained and in what time span? 3 pounds in a week   2) If swelling, where is the swelling located? Left leg knee down to foot and the right leg is puffy   3) Are you currently taking a fluid pill? yes  Are you currently SOB? no 4) Do you have a log of your daily weights (if so, list)?  07/20/2017  210.0    07/21/2017      211.8      07/22/2017   214.8  5) Have you gained 3 pounds in a day or 5 pounds in a week? 3 pounds in a week   Have you traveled recently? No

## 2017-07-24 ENCOUNTER — Telehealth: Payer: Self-pay | Admitting: Internal Medicine

## 2017-07-24 ENCOUNTER — Ambulatory Visit (INDEPENDENT_AMBULATORY_CARE_PROVIDER_SITE_OTHER): Payer: PPO | Admitting: *Deleted

## 2017-07-24 ENCOUNTER — Telehealth: Payer: Self-pay | Admitting: Physician Assistant

## 2017-07-24 DIAGNOSIS — I4819 Other persistent atrial fibrillation: Secondary | ICD-10-CM

## 2017-07-24 DIAGNOSIS — I481 Persistent atrial fibrillation: Secondary | ICD-10-CM | POA: Diagnosis not present

## 2017-07-24 MED ORDER — METOPROLOL SUCCINATE ER 100 MG PO TB24: 100.0000 mg | ORAL_TABLET | Freq: Two times a day (BID) | ORAL | 11 refills | Status: DC

## 2017-07-24 NOTE — Telephone Encounter (Signed)
Patient recently underwent TEE DCCV, noted to have recurrent tachycardia. Per nurse, EKG obtained in the office today showed recurrent afib with RVR. His rate control medication was reportedly reduced to 50mg  BID Toprol XL. Note, his HR was uncontrolled on 200mg  BID toprol XL when I saw him last, I reduced his Toprol XL to 100mg  BID to avoid significant post conversion bradycardia, this was reportedly further reduced to 50mg  BID after cardioversion.  At DOD's recommendation, toprol XL was increased to 100mg  BID again. Since his HR was uncontrolled previously on 200mg  BID of toprol, he will likely require antiarrythmic medication on followup. I have called the patient, fortunately, he is largely asymptomatic.   Hilbert Corrigan PA Pager: [phone]

## 2017-07-24 NOTE — Telephone Encounter (Signed)
Returned the call to the patient. He stated that he had a cardioversion on 12/26 where he was converted to sinus bradycardia. He stated that he woke up last night with a irregular heart rate at 112. He is asymptomatic denying chest pain and shortness of breath.   He did state that his swelling has gotten better since the increase in the lasix. He has not had a chance to weigh himself because he has been out of town. He will be coming in today for nurse visit and EKG.

## 2017-07-24 NOTE — Telephone Encounter (Signed)
Received page from patient. Had been doing well since cardioversion approximately 1 week ago. Having no symptoms, just noticed that his HR was 110 when checking his pulse. Has had some BLE edema over the past few weeks. Has been taking higher dose of lasix as recommended. Also taking metoprolol as recommended. No chest pain, pressure, SOB, orthopnea, dizziness, lightheadedness, syncope, presycnope.  Told patient that I would inform his cardiologist that he had likely gone back into AF. He will plan to call office tomorrow if he hasn't heard from someone by late afternoon. Patient knows to report to ED if he develops any of the symptoms noted above.  Marcie Mowers, MD Cardiology Fellow, PGY-5

## 2017-07-24 NOTE — Telephone Encounter (Signed)
Patient c/o Palpitations:  High priority if patient c/o lightheadedness, shortness of breath, or chest pain  How long have you had palpitations/irregular HR/ Afib? Are you having the symptoms now? 07/23/2017      yes 1) Are you currently experiencing lightheadedness, SOB or CP?  no  Do you have a history of afib (atrial fibrillation) or irregular heart rhythm? yes 2) Have you checked your BP or HR? (document readings if available): hr 110    75      3) Are you experiencing any other symptoms? no

## 2017-07-24 NOTE — Progress Notes (Signed)
The patient came in as a nurse visit for an EKG. He recently had a cardioversion on 12/26 where he was converted to normal sinus rhythm from atrial fibrillation. The patient stated that he woke up in the middle of the night and felt like he had an irregular rhythm. His heart rate was 112. The patient is asymptomatic, denying shortness of breath, fatigue and chest pain.         The EKG showed afib rvr with a heart rate of 122. Per the DOD, the                patient should increase his metoprolol to 100 mg bid. The patient        had requested an appointment to be seen. One has been made for                  tomorrow at 11:30 with Jory Sims, DNP. He has been informed          to call the office back if he becomes symptomatic or go to the ED for               further assessment. He has verbalized his understanding.

## 2017-07-24 NOTE — Telephone Encounter (Signed)
Returned the call to the patient. He stated that he had a cardioversion on 12/26 where he was converted to sinus bradycardia. He stated that he woke up last night with a irregular heart rate at 112. He is asymptomatic denying chest pain and shortness of breath. The patient will come in today at 4 for a nurse visit: EKG.

## 2017-07-25 ENCOUNTER — Encounter (INDEPENDENT_AMBULATORY_CARE_PROVIDER_SITE_OTHER): Payer: PPO

## 2017-07-25 ENCOUNTER — Telehealth: Payer: Self-pay | Admitting: Cardiology

## 2017-07-25 ENCOUNTER — Ambulatory Visit: Payer: PPO | Admitting: Adult Health

## 2017-07-25 VITALS — BP 112/60 | HR 110 | Ht 73.0 in | Wt 207.0 lb

## 2017-07-25 DIAGNOSIS — I4891 Unspecified atrial fibrillation: Secondary | ICD-10-CM | POA: Diagnosis not present

## 2017-07-25 DIAGNOSIS — I2510 Atherosclerotic heart disease of native coronary artery without angina pectoris: Secondary | ICD-10-CM

## 2017-07-25 NOTE — Progress Notes (Signed)
Cardiology Office Note   Date:  07/25/2017   ID:  [name] [name], DOB 04-Aug-1950, MRN [id number]  PCP:  [name], MD  Cardiologist:  [name] Chief Complaint  Patient presents with  . Follow-up  . Atrial Fibrillation     History of Present Illness: [name] [name] is a 67 y.o. male who presents for ongoing management of atrial fibrillation.  Patient is status post TEE cardioversion 07/17/2017 which was successful.  However, on repeat EKG 06/23/2018, EKG revealed A. fib with RVR.  He had been on metoprolol 50 mg twice daily prior notes documented that his heart rate was uncontrolled on 200 mg, twice daily.,  Prior to cardioversion his beta-blocker was reduced to post cardioversion bradycardia.He was again increased on BB to 100 mg BID.   Other history includes OSA, systolic dysfunction with EF of 40-45%, moderate bilateral dilation.   He comes today frustrated with multiple medication changes and adjustments.  He denies any complaints with the exception of dyspnea on exertion.  He offers no complaints of fluid retention.  He and his wife have multiple questions concerning his mitral valve, heart failure, and recurrent atrial fibrillation.  The patient is interested in following up with Dr. [name], for possible ablation.  He has multiple cardiac issues to include reduced EF of 40-45%, with moderate aortic valve and mitral valve regurgitation.  At home, blood pressures have been running heart rate is been between 40 and 75 bpm.  His weight has been maintained between 205 and 207 pounds.  His goal weight is to be below 200 based on BMI.  Past Medical History:  Diagnosis Date  . Chicken pox   . Coronary artery disease    Severe three-vessel coronary artery disease with ejection fraction of 65%  . GERD (gastroesophageal reflux disease)   . History of gout   . History of psoriasis   . Hypercholesterolemia   . Hypertension   . OSA (obstructive sleep apnea)    Moderate with AHI of  16.9/hr now on CPAP at 10cm H2O  . Psoriatic arthritis [facility])     Past Surgical History:  Procedure Laterality Date  . ATRIAL FLUTTER ABLATION  03/30/2014  . ATRIAL FLUTTER ABLATION N/A 03/30/2014   Procedure: ATRIAL FLUTTER ABLATION;  Surgeon: Coralyn Mark, MD;  Location: [facility] CATH LAB;  Service: Cardiovascular;  Laterality: N/A;  . CARDIAC CATHETERIZATION  09/2003   Ejection fraction was estimated at 65%.  . CARDIOVERSION N/A 03/15/2014   Procedure: CARDIOVERSION;  Surgeon: Lelon Perla, MD;  Location: [facility] ENDOSCOPY;  Service: Cardiovascular;  Laterality: N/A;  . CARDIOVERSION N/A 07/17/2017   Procedure: CARDIOVERSION;  Surgeon: Sueanne Margarita, MD;  Location: [facility];  Service: Cardiovascular;  Laterality: N/A;  . CORONARY ARTERY BYPASS GRAFT  09/2003   [place]ima-lad,svg-diag,svg-om/distal LCX,svg-pda  . TEE WITHOUT CARDIOVERSION N/A 03/15/2014   Procedure: TRANSESOPHAGEAL ECHOCARDIOGRAM (TEE);  Surgeon: Lelon Perla, MD;  Location: [facility] ENDOSCOPY;  Service: Cardiovascular;  Laterality: N/A;  . TEE WITHOUT CARDIOVERSION N/A 07/17/2017   Procedure: TRANSESOPHAGEAL ECHOCARDIOGRAM (TEE);  Surgeon: Sueanne Margarita, MD;  Location: [facility] ENDOSCOPY;  Service: Cardiovascular;  Laterality: N/A;  . TONSILLECTOMY  1950's     Current Outpatient Medications  Medication Sig Dispense Refill  . apixaban (ELIQUIS) 5 MG TABS tablet Take 1 tablet (5 mg total) by mouth 2 (two) times daily. 60 tablet 11  . atorvastatin (LIPITOR) 40 MG tablet TAKE 1 TABLET DAILY. 30 tablet 6  . cholecalciferol (VITAMIN D) 1000  UNITS tablet Take 1,000 Units by mouth daily.      . Coenzyme Q10 (CO Q 10 PO) Take 100 mg by mouth daily.     . Colchicine 0.6 MG CAPS Take 2 tablets by mouth 2 (two) times daily as needed.     . furosemide (LASIX) 20 MG tablet Take 1 tablet (20 mg total) by mouth daily. (Patient taking differently: Take 20 mg by mouth 2 (two) times daily. ) 30 tablet 6  . losartan (COZAAR) 50 MG tablet Take 1 tablet  (50 mg total) by mouth daily. 90 tablet 3  . metoprolol succinate (TOPROL-XL) 50 MG 24 hr tablet Take 100 mg by mouth 2 (two) times daily. Take with or immediately following a meal.    . multivitamin (THERAGRAN) per tablet Take 1 tablet by mouth daily.      . nitroGLYCERIN (NITROSTAT) 0.4 MG SL tablet PLACE 1 TABLET UNDER THE TONGUE EVERY 5 MINUTES AS NEEDED FOR CHEST PAIN. 25 tablet 0  . NON FORMULARY Take 1 capsule by mouth daily. Borage Oil as directed     . Omega-3 Fatty Acids (FISH OIL CONCENTRATE PO) Take 1,700 mg by mouth daily.      . potassium chloride SA (K-DUR,KLOR-CON) 20 MEQ tablet Take 1 tablet (20 mEq total) by mouth daily. 30 tablet 6  . tadalafil (CIALIS) 20 MG tablet Take one every other day as needed. (Patient taking differently: Take 10 mg by mouth daily as needed. Take one every other day as needed.) 6 tablet 11   No current facility-administered medications for this visit.     Allergies:   Patient has no known allergies.    Social History:  The patient  reports that he has quit smoking. His smoking use included cigarettes. He has a 2.50 pack-year smoking history. he has never used smokeless tobacco. He reports that he drinks about 2.4 oz of alcohol per week. He reports that he does not use drugs.   Family History:  The patient's family history includes Cancer in his mother; Heart disease in his mother; Stroke in his mother.    ROS: All other systems are reviewed and negative. Unless otherwise mentioned in H&P    PHYSICAL EXAM: VS:  BP 112/60   Pulse (!) 110   Ht 6\' 1"  (1.854 m)   Wt 207 lb (93.9 kg)   BMI 27.31 kg/m  , BMI Body mass index is 27.31 kg/m. GEN: Well nourished, well developed, in no acute distress  HEENT: normal  Neck: no JVD, carotid bruits, or masses Cardiac:RRR; 1/6 systolic  Murmur heard best at the left sternal boarder, rubs, or gallops,no edema  Respiratory:  clear to auscultation bilaterally, normal work of breathing GI: soft, nontender,  nondistended, + BS MS: no deformity or atrophy  Skin: warm and dry, no rash Neuro:  Strength and sensation are intact Psych: euthymic mood, full affect   EKG:  Atrial fib rate of 117 bpm, competing junctional.   Recent Labs: 07/04/2017: ALT 36; BNP 421.5; BUN 9; Creatinine, Ser 1.01; Hemoglobin 14.8; Platelets 204; Potassium 4.1; Sodium 140; TSH 0.956    Lipid Panel    Component Value Date/Time   CHOL 153 02/14/2017 0000   TRIG 65 02/14/2017 0000   HDL 71 02/14/2017 0000   CHOLHDL 2.2 02/14/2017 0000   CHOLHDL 3 11/23/2015 0837   VLDL 16.4 11/23/2015 0837   LDLCALC 69 02/14/2017 0000      Wt Readings from Last 3 Encounters:  07/25/17 207 lb (93.9  kg)  07/17/17 213 lb 12.8 oz (97 kg)  07/12/17 213 lb 12.8 oz (97 kg)      Other studies Reviewed:  Echocardiogram 07/08/2017 Additional studies/ records that were reviewed today include: Echocardiogram Review of the above records demonstrates:  Left ventricle: The cavity size was normal. Wall thickness was   increased in a pattern of moderate LVH. Systolic function was   mildly to moderately reduced. The estimated ejection fraction was   in the range of 40% to 45%. - Aortic valve: There was moderate regurgitation. - Mitral valve: There was moderate regurgitation. - Left atrium: The atrium was moderately dilated. - Right atrium: The atrium was moderately dilated.   ASSESSMENT AND PLAN:  1.  Persistent atrial fibrillation: He has had recent TEE cardioversion 06/2018. He has had multiple dosage changes of metoprolol post cardioversion. He has recently been increased to 100 mg BID but has been on doses up to 200 mg BID. BP is soft.   I am reluctant to place on anti-arrhytmic at this time as he has HR at home in the 50's to 70's. With soft to normal BP's. Today EKG demonstrates atrial fib with competing junctional pacemaker. Instead I will place a cardiac monitor on the patient for one week to ascertain his HR away from the  office and during normal day.   I have spoken to Dr. [name] by phone today to discuss this case. He will see the patient personally to review EKG;s monitor results and discuss further options and treatment. This patient may, in fact, be placed on anti arrhythmic.   2. CAD: Hx of CABG 2000. He continues on secondary prevention with statin therapy.   3. HFrEF:  He continues on lasix and ARB. May need to have repeat cardiac cath due to reduced EF. Will leave to Dr. Jackalyn Lombard discretion.   Current medicines are reviewed at length with the patient today.    Labs/ tests ordered today include:  Phill Myron. [place] Pugh, ANP, AACC   07/25/2017 12:50 PM    [facility] [address], [place], [place] [address] Phone: [phone]; Fax: [phone]

## 2017-07-25 NOTE — Telephone Encounter (Signed)
Received call from [employer] monitoring about abnormal EKG. Noted that patient had an nonsustained episodes of atrial flutter with rate of 110. In review of chart, patient is on Eliquis and BB. Was seen in the office today for the same. Attempted to call patient twice but no answer and voice mail box full.

## 2017-07-25 NOTE — Patient Instructions (Signed)
Medication Instructions:  NO CHANGES-Your physician recommends that you continue on your current medications as directed. Please refer to the Current Medication list given to you today.  If you need a refill on your cardiac medications before your next appointment, please call your pharmacy.  Testing/Procedures: Your physician has recommended that you wear an event monitor-FOR 1 WEEK. Event monitors are medical devices that record the heart's electrical activity. Doctors most often [place] these monitors to diagnose arrhythmias. Arrhythmias are problems with the speed or rhythm of the heartbeat. The monitor is a small, portable device. You can wear one while you do your normal daily activities. This is usually used to diagnose what is causing palpitations/syncope (passing out) OR atrial fibrillation.  WE WILL CALL WITH ANY MONITOR NOTIFICATION AS THEY COME UP.   Follow-Up: Your physician wants you to follow-up: WITH DR Jackalyn Lombard OFFICE THEY WILL CALL YOU TO MAKE AN APPOINTMENT.   Thank you for choosing [facility] at [facility]!!

## 2017-07-26 ENCOUNTER — Ambulatory Visit: Payer: PPO | Admitting: Physician Assistant

## 2017-07-29 ENCOUNTER — Telehealth: Payer: Self-pay | Admitting: *Deleted

## 2017-07-29 NOTE — Telephone Encounter (Signed)
Dr. [name] aware of auto triggered monitor reports.  No change in plan of care at this time.  Will send message to scheduler to follow up on appointment with Dr. [name].

## 2017-07-29 NOTE — Telephone Encounter (Signed)
Serious reportings from [employer] received from 1/5 and 1/6 (Day 3 and 4). V-Tach, atrial flutter on 1/5.  Vtach (5-6 beats) and  A fib w/ RVR 150 bpm on 1/6. All are auto triggered. Pt noticed his HR has been higher (80-s100s) at rest and upto 150 at the gym on bike working at low-moderate level. Per OV note on 1/3, will review with Dr. [name].  Pt aware I will call him if any changes.

## 2017-07-30 ENCOUNTER — Telehealth: Payer: Self-pay | Admitting: Internal Medicine

## 2017-07-30 ENCOUNTER — Telehealth: Payer: Self-pay | Admitting: Cardiology

## 2017-07-30 NOTE — Telephone Encounter (Signed)
New Message  Pt call requesting to speak with RN. Pt states he spoke with RN on yesterday and was waiting for RN to call back after being advised by Dr. [name]. Please call back to discuss

## 2017-07-30 NOTE — Telephone Encounter (Signed)
Spoke to [name] [name],  Patient is in the office now,patient is there for a periodontal cleaning . Does he needed pre med.  RN REVIEWED with Dr Arnold Long DNP PATIENT DOES NOT PRE MEDICATION  FOR CLEANING .  OFFICE HUNG UP  - will try to contact office

## 2017-07-30 NOTE — Telephone Encounter (Signed)
[name] [name] with Dr Fenton Foy office aware

## 2017-07-30 NOTE — Telephone Encounter (Signed)
Spoke with patient and advised will send another message to Dr Jackalyn Lombard scheduler for appointment to be scheduled per phone note 07/29/17. Advised patient if he does not hear from her by Thursday to call back

## 2017-07-31 ENCOUNTER — Telehealth: Payer: Self-pay | Admitting: Internal Medicine

## 2017-07-31 NOTE — Telephone Encounter (Signed)
New message   Patient says that he would like an earlier appt than the one made for him on 08/12/2017 but nothing is available Please call.

## 2017-08-01 ENCOUNTER — Ambulatory Visit: Payer: PPO | Admitting: Physician Assistant

## 2017-08-01 NOTE — Telephone Encounter (Signed)
F/u Message  Pt call to f/u on getting a sooner appt with Dr. [name].

## 2017-08-01 NOTE — Telephone Encounter (Signed)
Discussed with Dr [name] and [name] [name] says if [name] [name] needs to be seen sooner then we can schedule him with [name], NP.  Spoke with patient and [name] [name] would like to see Butch Penny tomorrow.  I have faxed his strips to afib clinic.  [name] [name] is aware and has the code to park.

## 2017-08-02 ENCOUNTER — Encounter (HOSPITAL_COMMUNITY): Payer: Self-pay | Admitting: Nurse Practitioner

## 2017-08-02 ENCOUNTER — Ambulatory Visit (HOSPITAL_COMMUNITY)
Admission: RE | Admit: 2017-08-02 | Discharge: 2017-08-02 | Disposition: A | Discharge status: Home / Self Care | Payer: PPO | Source: Ambulatory Visit | Attending: Nurse Practitioner | Admitting: Nurse Practitioner

## 2017-08-02 VITALS — BP 110/68 | HR 101 | Ht 73.0 in | Wt 205.2 lb

## 2017-08-02 DIAGNOSIS — I4891 Unspecified atrial fibrillation: Secondary | ICD-10-CM | POA: Insufficient documentation

## 2017-08-02 DIAGNOSIS — L4050 Arthropathic psoriasis, unspecified: Secondary | ICD-10-CM | POA: Insufficient documentation

## 2017-08-02 DIAGNOSIS — Z7901 Long term (current) use of anticoagulants: Secondary | ICD-10-CM | POA: Insufficient documentation

## 2017-08-02 DIAGNOSIS — K219 Gastro-esophageal reflux disease without esophagitis: Secondary | ICD-10-CM | POA: Diagnosis not present

## 2017-08-02 DIAGNOSIS — I481 Persistent atrial fibrillation: Secondary | ICD-10-CM

## 2017-08-02 DIAGNOSIS — G4733 Obstructive sleep apnea (adult) (pediatric): Secondary | ICD-10-CM | POA: Diagnosis not present

## 2017-08-02 DIAGNOSIS — E7800 Pure hypercholesterolemia, unspecified: Secondary | ICD-10-CM | POA: Insufficient documentation

## 2017-08-02 DIAGNOSIS — I10 Essential (primary) hypertension: Secondary | ICD-10-CM | POA: Diagnosis not present

## 2017-08-02 DIAGNOSIS — Z87891 Personal history of nicotine dependence: Secondary | ICD-10-CM | POA: Insufficient documentation

## 2017-08-02 DIAGNOSIS — I2510 Atherosclerotic heart disease of native coronary artery without angina pectoris: Secondary | ICD-10-CM | POA: Insufficient documentation

## 2017-08-02 DIAGNOSIS — Z79899 Other long term (current) drug therapy: Secondary | ICD-10-CM | POA: Diagnosis not present

## 2017-08-02 DIAGNOSIS — I4819 Other persistent atrial fibrillation: Secondary | ICD-10-CM

## 2017-08-02 NOTE — Progress Notes (Signed)
Primary Care Physician: [name], MD Referring Physician: Dr. [name] Cardiologist: [name]   [name] [name] is a 67 y.o. male with a h/o CAD, s/p 3 vessel bypass in 2005, HTN, aflutter s/p ablation, OSA treated with CPAP, in the afib clinic for f/u failed cardioversion, 07/17/17, for new onset afib .  The wife noted that pt had an irregular heart beat in November and he was seen by [name] 12/12, started on eliquis 5 mg bid, BB increased and set up for cardioversion, 12/26, which was successful. TEE at that time showed moderate to severe MR, medium size patent foramen ovale, mod TR regurgitation. EF was 40-45%. He had f/u with Arnold Long, NP early [place] . It was mentioned in her note that EF was reduced and would leave to the discretion for cath up to Dr. [name]. She did speak to him by phone. He as placed on an event monitor and appointment with Dr. [name] was requested.In the interim,serious notifications form the monitoring company were received and it showed short runs of v tach vrs afib with aberrancy.   Therefore, pt was asked to be seen more urgently in the afib clinic. He is not in any distress today but pt has had a big exercise intolerance since early fall. He was doing  a lot of hiking and very frequent gym work outs in the summer and has noted a big difference in his exercise ability. He has not noted any of the runs of aberrant rhythm by event monitor. These runs were reviewed by Dr. [name] and he can not say for sure that these are not v tach. Wife and pt have many questions re abnormal valve function/EF by his TEE. He is having intermittent shortness of breath but none today. Stress test in 02/2017 showed low risk.  Today, he denies symptoms of palpitations, chest pain, shortness of breath, orthopnea, PND, lower extremity edema, dizziness, presyncope, syncope, or neurologic sequela. The patient is tolerating medications without difficulties and is otherwise without  complaint today.   Past Medical History:  Diagnosis Date  . Chicken pox   . Coronary artery disease    Severe three-vessel coronary artery disease with ejection fraction of 65%  . GERD (gastroesophageal reflux disease)   . History of gout   . History of psoriasis   . Hypercholesterolemia   . Hypertension   . OSA (obstructive sleep apnea)    Moderate with AHI of 16.9/hr now on CPAP at 10cm H2O  . Psoriatic arthritis [facility])    Past Surgical History:  Procedure Laterality Date  . ATRIAL FLUTTER ABLATION  03/30/2014  . ATRIAL FLUTTER ABLATION N/A 03/30/2014   Procedure: ATRIAL FLUTTER ABLATION;  Surgeon: Coralyn Mark, MD;  Location: [place] CATH LAB;  Service: Cardiovascular;  Laterality: N/A;  . CARDIAC CATHETERIZATION  09/2003   Ejection fraction was estimated at 65%.  . CARDIOVERSION N/A 03/15/2014   Procedure: CARDIOVERSION;  Surgeon: Lelon Perla, MD;  Location: [facility] ENDOSCOPY;  Service: Cardiovascular;  Laterality: N/A;  . CARDIOVERSION N/A 07/17/2017   Procedure: CARDIOVERSION;  Surgeon: Sueanne Margarita, MD;  Location: [place];  Service: Cardiovascular;  Laterality: N/A;  . CORONARY ARTERY BYPASS GRAFT  09/2003   [place]-lad,svg-diag,svg-om/distal LCX,svg-pda  . TEE WITHOUT CARDIOVERSION N/A 03/15/2014   Procedure: TRANSESOPHAGEAL ECHOCARDIOGRAM (TEE);  Surgeon: Lelon Perla, MD;  Location: [facility] ENDOSCOPY;  Service: Cardiovascular;  Laterality: N/A;  . TEE WITHOUT CARDIOVERSION N/A 07/17/2017   Procedure: TRANSESOPHAGEAL ECHOCARDIOGRAM (TEE);  Surgeon:  Sueanne Margarita, MD;  Location: [facility] ENDOSCOPY;  Service: Cardiovascular;  Laterality: N/A;  . TONSILLECTOMY  1950's    Current Outpatient Medications  Medication Sig Dispense Refill  . apixaban (ELIQUIS) 5 MG TABS tablet Take 1 tablet (5 mg total) by mouth 2 (two) times daily. 60 tablet 11  . atorvastatin (LIPITOR) 40 MG tablet TAKE 1 TABLET DAILY. 30 tablet 6  . cholecalciferol (VITAMIN D) 1000 UNITS tablet Take 1,000 Units by  mouth daily.      . Coenzyme Q10 (CO Q 10 PO) Take 100 mg by mouth daily.     . Colchicine 0.6 MG CAPS Take 2 tablets by mouth 2 (two) times daily as needed.     . furosemide (LASIX) 20 MG tablet Take 1 tablet (20 mg total) by mouth daily. (Patient taking differently: Take 20 mg by mouth 2 (two) times daily. ) 30 tablet 6  . losartan (COZAAR) 50 MG tablet Take 1 tablet (50 mg total) by mouth daily. 90 tablet 3  . metoprolol succinate (TOPROL-XL) 50 MG 24 hr tablet Take 100 mg by mouth 2 (two) times daily. Take with or immediately following a meal.    . multivitamin (THERAGRAN) per tablet Take 1 tablet by mouth daily.      . nitroGLYCERIN (NITROSTAT) 0.4 MG SL tablet PLACE 1 TABLET UNDER THE TONGUE EVERY 5 MINUTES AS NEEDED FOR CHEST PAIN. 25 tablet 0  . potassium chloride SA (K-DUR,KLOR-CON) 20 MEQ tablet Take 1 tablet (20 mEq total) by mouth daily. 30 tablet 6  . tadalafil (CIALIS) 20 MG tablet Take one every other day as needed. (Patient taking differently: Take 10 mg by mouth daily as needed. Take one every other day as needed.) 6 tablet 11   No current facility-administered medications for this encounter.     No Known Allergies  Social History   Socioeconomic History  . Marital status: Married    Spouse name: Not on file  . Number of children: 1  . Years of education: Not on file  . Highest education level: Not on file  Social Needs  . Financial resource strain: Not on file  . Food insecurity - worry: Not on file  . Food insecurity - inability: Not on file  . Transportation needs - medical: Not on file  . Transportation needs - non-medical: Not on file  Occupational History  . Occupation: Print production planner: [employer]  Tobacco Use  . Smoking status: Former Smoker    Packs/day: 0.50    Years: 5.00    Pack years: 2.50    Types: Cigarettes  . Smokeless tobacco: Never Used  . Tobacco comment: "quit smoking cigarettes in the late 1970's"  Substance and  Sexual Activity  . Alcohol use: Yes    Alcohol/week: 2.4 oz    Types: 2 Cans of beer, 2 Shots of liquor per week  . Drug use: No  . Sexual activity: Yes  Other Topics Concern  . Not on file  Social History Narrative  . Not on file    Family History  Problem Relation Age of Onset  . Heart disease Mother        cabg  . Cancer Mother        breast  . Stroke Mother     ROS- All systems are reviewed and negative except as per the HPI above  Physical Exam: Vitals:   08/02/17 1021  BP: 110/68  Pulse: (!) 101  Weight: 205  lb 3.2 oz (93.1 kg)  Height: 6\' 1"  (1.854 m)   Wt Readings from Last 3 Encounters:  08/02/17 205 lb 3.2 oz (93.1 kg)  07/25/17 207 lb (93.9 kg)  07/17/17 213 lb 12.8 oz (97 kg)    Labs: Lab Results  Component Value Date   NA 140 07/04/2017   K 4.1 07/04/2017   CL 103 07/04/2017   CO2 21 07/04/2017   GLUCOSE 121 (H) 07/04/2017   BUN 9 07/04/2017   CREATININE 1.01 07/04/2017   CALCIUM 9.9 07/04/2017   Lab Results  Component Value Date   INR 1.08 06/12/2009   Lab Results  Component Value Date   CHOL 153 02/14/2017   HDL 71 02/14/2017   LDLCALC 69 02/14/2017   TRIG 65 02/14/2017     GEN- The patient is well appearing, alert and oriented x 3 today.   Head- normocephalic, atraumatic Eyes-  Sclera clear, conjunctiva pink Ears- hearing intact Oropharynx- clear Neck- supple, no JVP Lymph- no cervical lymphadenopathy Lungs- Clear to ausculation bilaterally, normal work of breathing Heart-irregular rate and rhythm, no murmurs, rubs or gallops, PMI not laterally displaced GI- soft, NT, ND, + BS Extremities- no clubbing, cyanosis, or edema MS- no significant deformity or atrophy Skin- no rash or lesion Psych- euthymic mood, full affect Neuro- strength and sensation are intact  EKG-afib at 101 bpm, qrs int 96 ms, qtc 427 ms TTE-------------------------------------------------------------------- Study Conclusions  - Left ventricle: The  cavity size was normal. Wall thickness was   increased in a pattern of moderate LVH. Systolic function was   mildly to moderately reduced. The estimated ejection fraction was   in the range of 40% to 45%. - Aortic valve: There was moderate regurgitation. - Mitral valve: There was moderate regurgitation. - Left atrium: The atrium was moderately dilated. - Right atrium: The atrium was moderately dilated.   TEE-Study Conclusions  - Left ventricle: The estimated ejection fraction was in the range   of 40% to 45%. Mild diffuse hypokinesis with no identifiable   regional variations. No evidence of thrombus. - Aortic valve: There was moderate perivalvular regurgitation. - Mitral valve: No evidence of vegetation. There was moderate to   severe regurgitation, with multiple jets. Diastolic regurgitation   was absent. - Left atrium: The atrium was moderately dilated. No evidence of   thrombus in the atrial cavity or appendage. No evidence of   thrombus in the atrial cavity or appendage. Emptying velocity was   reduced. - Right atrium: The atrium was moderately dilated. - Atrial septum: There was a medium-sized fenestrated patent   foramen ovale. A in the fossa ovalis region was present. There   was a moderate bidirectional shunt through a patent foramen   ovale, in the baseline state. - Tricuspid valve: No evidence of vegetation. There was moderate   regurgitation. - Pulmonic valve: No evidence of vegetation.  Impressions:  - Successful cardioversion. No cardiac source of emboli was   indentified.   Stress test 02/2017-Blood pressure demonstrated a hypertensive response to exercise.  Upsloping ST segment depression ST segment depression of 1 mm was noted during stress in the II, III, aVF, V4 and V5 leads, and returning to baseline after less than 1 minute of recovery.   1. Good exercise tolerance.  2. Upsloping ST depression in inferior leads and V4/V5.  This is nonspecific.    No evidence for ischemia.  Event strips reviewed with Dr. [name]  from event monitor  Assessment and Plan: 1. New onset  atrial fibrillation Not sure as to exact onset of afib but general decline in exercise tolerance with shortness of breath in the early fall, afib vrs anginal equivalent Acceptable rate control today Continue current dose of BB Continue eliquis 5 mg bid for a chadsvasc score of at least 4 Event monitor shows short runs of v tach vrs afib with aberrancy, reviewed by Dr. [name] and he can not say for sure that strips do not show v tach Recent TEE showed  significant valvular changes and reduced EF  Discussed with Dr. [name] he felt that with the above issues,  that it may be more prudent to start with [facility], and then if no issues, admit later for tikosyn.  I spoke to [name] and it felt this was a reasonable approach He will be at the end of the 30 days post cardioversion 1/26, as anticoagulation can not be stopped prior to then, and will set up cath around 1/30 with [name].   F/u per cath findings  Geroge Baseman. Deroy Noah, [facility] [address] [place], [place] [address] [phone]

## 2017-08-05 ENCOUNTER — Other Ambulatory Visit (HOSPITAL_COMMUNITY): Payer: Self-pay | Admitting: *Deleted

## 2017-08-05 ENCOUNTER — Encounter (HOSPITAL_COMMUNITY): Payer: Self-pay | Admitting: *Deleted

## 2017-08-05 DIAGNOSIS — R0609 Other forms of dyspnea: Principal | ICD-10-CM

## 2017-08-05 DIAGNOSIS — R0600 Dyspnea, unspecified: Secondary | ICD-10-CM

## 2017-08-08 ENCOUNTER — Telehealth: Payer: Self-pay | Admitting: *Deleted

## 2017-08-08 NOTE — Telephone Encounter (Signed)
Received fax from [facility] for a coverage determination for TIKOSYN, this med has not been started yet, I have completed an initial PA and will need Dr Bonita Quin signature, placed in his mailbox.

## 2017-08-09 NOTE — Telephone Encounter (Signed)
**Note De-Identified Sherryl Valido Obfuscation** Dr [name] has signed the PA and I have faxed it back to [facility] at [phone].

## 2017-08-12 ENCOUNTER — Other Ambulatory Visit: Payer: PPO

## 2017-08-12 ENCOUNTER — Ambulatory Visit: Payer: PPO | Admitting: Internal Medicine

## 2017-08-12 NOTE — Progress Notes (Signed)
Cardiology Office Note:    Date:  08/13/2017   ID:  [name] [name], DOB 01/01/1951, MRN [id number]  PCP:  [name], MD  Cardiologist:  No primary care provider on file.    Referring MD: [name], MD   Chief Complaint  Patient presents with  . Sleep Apnea    History of Present Illness:    [name] [name] is a 67 y.o. male with a hx of moderate OSA with an AHI of 16.9 events per hour mainly occurring in the non supine position and during NREM sleep. He is on CPAP at 10cm H2O.  He is doing well with his CPAP device.  He tolerates the nasal mask and feels the pressure is adequate.  Since going on CPAP he feels rested in the am and has no significant daytime sleepiness.  He does have some mouth dryness.  He does not think that he snores.     Past Medical History:  Diagnosis Date  . Chicken pox   . Coronary artery disease    Severe three-vessel coronary artery disease with ejection fraction of 65%  . GERD (gastroesophageal reflux disease)   . History of gout   . History of psoriasis   . Hypercholesterolemia   . Hypertension   . OSA (obstructive sleep apnea)    Moderate with AHI of 16.9/hr now on CPAP at 10cm H2O  . Psoriatic arthritis [facility])     Past Surgical History:  Procedure Laterality Date  . ATRIAL FLUTTER ABLATION  03/30/2014  . ATRIAL FLUTTER ABLATION N/A 03/30/2014   Procedure: ATRIAL FLUTTER ABLATION;  Surgeon: Coralyn Mark, MD;  Location: [place] CATH LAB;  Service: Cardiovascular;  Laterality: N/A;  . CARDIAC CATHETERIZATION  09/2003   Ejection fraction was estimated at 65%.  . CARDIOVERSION N/A 03/15/2014   Procedure: CARDIOVERSION;  Surgeon: Lelon Perla, MD;  Location: [facility] ENDOSCOPY;  Service: Cardiovascular;  Laterality: N/A;  . CARDIOVERSION N/A 07/17/2017   Procedure: CARDIOVERSION;  Surgeon: Sueanne Margarita, MD;  Location: [place];  Service: Cardiovascular;  Laterality: N/A;  . CORONARY ARTERY BYPASS GRAFT  09/2003   [place]ima-lad,svg-diag,svg-om/distal LCX,svg-pda  . TEE WITHOUT CARDIOVERSION N/A 03/15/2014   Procedure: TRANSESOPHAGEAL ECHOCARDIOGRAM (TEE);  Surgeon: Lelon Perla, MD;  Location: [facility] ENDOSCOPY;  Service: Cardiovascular;  Laterality: N/A;  . TEE WITHOUT CARDIOVERSION N/A 07/17/2017   Procedure: TRANSESOPHAGEAL ECHOCARDIOGRAM (TEE);  Surgeon: Sueanne Margarita, MD;  Location: [facility] ENDOSCOPY;  Service: Cardiovascular;  Laterality: N/A;  . TONSILLECTOMY  1950's    Current Medications: Current Meds  Medication Sig  . apixaban (ELIQUIS) 5 MG TABS tablet Take 1 tablet (5 mg total) by mouth 2 (two) times daily.  Marland Kitchen atorvastatin (LIPITOR) 40 MG tablet TAKE 1 TABLET DAILY.  . cholecalciferol (VITAMIN D) 1000 UNITS tablet Take 1,000 Units by mouth daily.    . Coenzyme Q10 (CO Q 10 PO) Take 100 mg by mouth daily.   . Colchicine 0.6 MG CAPS Take 2 tablets by mouth 2 (two) times daily as needed.   . furosemide (LASIX) 20 MG tablet Take 20 mg by mouth 2 (two) times daily.  Marland Kitchen losartan (COZAAR) 50 MG tablet Take 1 tablet (50 mg total) by mouth daily.  . metoprolol succinate (TOPROL-XL) 50 MG 24 hr tablet Take 100 mg by mouth 2 (two) times daily. Take with or immediately following a meal.  . multivitamin (THERAGRAN) per tablet Take 1 tablet by mouth daily.    . nitroGLYCERIN (NITROSTAT) 0.4 MG  SL tablet PLACE 1 TABLET UNDER THE TONGUE EVERY 5 MINUTES AS NEEDED FOR CHEST PAIN.  Marland Kitchen potassium chloride SA (K-DUR,KLOR-CON) 20 MEQ tablet Take 1 tablet (20 mEq total) by mouth daily.  . tadalafil (CIALIS) 20 MG tablet Take 20 mg by mouth as directed.     Allergies:   Patient has no known allergies.   Social History   Socioeconomic History  . Marital status: Married    Spouse name: None  . Number of children: 1  . Years of education: None  . Highest education level: None  Social Needs  . Financial resource strain: None  . Food insecurity - worry: None  . Food insecurity - inability: None  . Transportation  needs - medical: None  . Transportation needs - non-medical: None  Occupational History  . Occupation: Print production planner: [employer]  Tobacco Use  . Smoking status: Former Smoker    Packs/day: 0.50    Years: 5.00    Pack years: 2.50    Types: Cigarettes  . Smokeless tobacco: Never Used  . Tobacco comment: "quit smoking cigarettes in the late 1970's"  Substance and Sexual Activity  . Alcohol use: Yes    Alcohol/week: 2.4 oz    Types: 2 Cans of beer, 2 Shots of liquor per week  . Drug use: No  . Sexual activity: Yes  Other Topics Concern  . None  Social History Narrative  . None     Family History: The patient's family history includes Cancer in his mother; Heart disease in his mother; Stroke in his mother.  ROS:   Please see the history of present illness.    ROS  All other systems reviewed and negative.   EKGs/Labs/Other Studies Reviewed:    The following studies were reviewed today: CPAP download  EKG:  EKG is not ordered today.    Recent Labs: 07/04/2017: ALT 36; BNP 421.5; BUN 9; Creatinine, Ser 1.01; Hemoglobin 14.8; Platelets 204; Potassium 4.1; Sodium 140; TSH 0.956   Recent Lipid Panel    Component Value Date/Time   CHOL 153 02/14/2017 0000   TRIG 65 02/14/2017 0000   HDL 71 02/14/2017 0000   CHOLHDL 2.2 02/14/2017 0000   CHOLHDL 3 11/23/2015 0837   VLDL 16.4 11/23/2015 0837   LDLCALC 69 02/14/2017 0000    Physical Exam:    VS:  BP (!) 100/52   Pulse 72   Ht 6\' 1"  (1.854 m)   Wt 206 lb 9.6 oz (93.7 kg)   SpO2 97%   BMI 27.26 kg/m     Wt Readings from Last 3 Encounters:  08/13/17 206 lb 9.6 oz (93.7 kg)  08/02/17 205 lb 3.2 oz (93.1 kg)  07/25/17 207 lb (93.9 kg)     GEN:  Well nourished, well developed in no acute distress HEENT: Normal NECK: No JVD; No carotid bruits LYMPHATICS: No lymphadenopathy CARDIAC: irregularly irregular, no murmurs, rubs, gallops RESPIRATORY:  Clear to auscultation without rales,  wheezing or rhonchi  ABDOMEN: Soft, non-tender, non-distended MUSCULOSKELETAL:  No edema; No deformity  SKIN: Warm and dry NEUROLOGIC:  Alert and oriented x 3 PSYCHIATRIC:  Normal affect   ASSESSMENT:    1. OSA (obstructive sleep apnea)   2. Essential hypertension   3. Overweight (BMI 25.0-29.9)    PLAN:    In order of problems listed above:  1.  OSA - the patient is tolerating PAP therapy well without any problems. The PAP download was reviewed today and  showed an AHI of 4.9/hr on 10 cm H2O with 97% compliance in using more than 4 hours nightly.  The patient has been using and benefiting from PAP use and will continue to benefit from therapy. He thinks he has a chin strap at home that I encouraged him to try to see if the chin strap will help with his dry mouth.  2.  HTN - BP is controlled on exam today.  He will continue on losartan 50mg  daily and Toprol XL 50mg  daily.  3.  Overweight - I have encouraged him to get into a routine exercise program and cut back on carbs and portions.    Medication Adjustments/Labs and Tests Ordered: Current medicines are reviewed at length with the patient today.  Concerns regarding medicines are outlined above.  No orders of the defined types were placed in this encounter.  No orders of the defined types were placed in this encounter.   Signed, Fransico Him, MD  08/13/2017 9:01 AM    [place]

## 2017-08-13 ENCOUNTER — Encounter: Payer: Self-pay | Admitting: Cardiology

## 2017-08-13 ENCOUNTER — Other Ambulatory Visit: Payer: PPO

## 2017-08-13 ENCOUNTER — Ambulatory Visit: Payer: PPO | Admitting: Cardiology

## 2017-08-13 VITALS — BP 100/52 | HR 72 | Ht 73.0 in | Wt 206.6 lb

## 2017-08-13 DIAGNOSIS — E663 Overweight: Secondary | ICD-10-CM

## 2017-08-13 DIAGNOSIS — R0609 Other forms of dyspnea: Principal | ICD-10-CM

## 2017-08-13 DIAGNOSIS — R0600 Dyspnea, unspecified: Secondary | ICD-10-CM

## 2017-08-13 DIAGNOSIS — Z01818 Encounter for other preprocedural examination: Secondary | ICD-10-CM | POA: Diagnosis not present

## 2017-08-13 DIAGNOSIS — I10 Essential (primary) hypertension: Secondary | ICD-10-CM | POA: Diagnosis not present

## 2017-08-13 DIAGNOSIS — G4733 Obstructive sleep apnea (adult) (pediatric): Secondary | ICD-10-CM | POA: Diagnosis not present

## 2017-08-13 LAB — CBC WITH DIFFERENTIAL/PLATELET
BASOS: 0 %
Basophils Absolute: 0 10*3/uL (ref 0.0–0.2)
EOS (ABSOLUTE): 0.1 10*3/uL (ref 0.0–0.4)
EOS: 2 %
HEMATOCRIT: 45.5 % (ref 37.5–51.0)
Hemoglobin: 15.7 g/dL (ref 13.0–17.7)
IMMATURE GRANULOCYTES: 0 %
Immature Grans (Abs): 0 10*3/uL (ref 0.0–0.1)
LYMPHS ABS: 1.3 10*3/uL (ref 0.7–3.1)
Lymphs: 23 %
MCH: 31.5 pg (ref 26.6–33.0)
MCHC: 34.5 g/dL (ref 31.5–35.7)
MCV: 91 fL (ref 79–97)
MONOS ABS: 0.7 10*3/uL (ref 0.1–0.9)
Monocytes: 12 %
NEUTROS ABS: 3.3 10*3/uL (ref 1.4–7.0)
Neutrophils: 63 %
Platelets: 163 10*3/uL (ref 150–379)
RBC: 4.98 x10E6/uL (ref 4.14–5.80)
RDW: 13.1 % (ref 12.3–15.4)
WBC: 5.4 10*3/uL (ref 3.4–10.8)

## 2017-08-13 LAB — BASIC METABOLIC PANEL
BUN / CREAT RATIO: 13 (ref 10–24)
BUN: 13 mg/dL (ref 8–27)
CO2: 25 mmol/L (ref 20–29)
CREATININE: 1.01 mg/dL (ref 0.76–1.27)
Calcium: 9.9 mg/dL (ref 8.6–10.2)
Chloride: 102 mmol/L (ref 96–106)
GFR, EST AFRICAN AMERICAN: 88 mL/min/{1.73_m2} (ref >59)
GFR, EST NON AFRICAN AMERICAN: 76 mL/min/{1.73_m2} (ref >59)
Glucose: 102 mg/dL — ABNORMAL HIGH (ref 65–99)
POTASSIUM: 4.9 mmol/L (ref 3.5–5.2)
Sodium: 140 mmol/L (ref 134–144)

## 2017-08-13 NOTE — Addendum Note (Signed)
Addended by: Teressa Senter on: 08/13/2017 10:27 AM   Modules accepted: Orders

## 2017-08-13 NOTE — Patient Instructions (Signed)

## 2017-08-14 DIAGNOSIS — I481 Persistent atrial fibrillation: Secondary | ICD-10-CM | POA: Diagnosis not present

## 2017-08-14 NOTE — Progress Notes (Signed)
Cardiology Office Note   Date:  08/19/2017   ID:  Jet, Armbrust [birth year], MRN [id number]  PCP:  [name], MD  Cardiologist:   [name], MD   Chief Complaint  Patient presents with  . Follow-up    6 months  . Edema    Ankles.  . Shortness of Breath      History of Present Illness: [name] [name] is a 67 y.o. male who is seen for follow up Afib and CHF. He has a history of  atrial flutter and CAD. He is status post CABG in 2005. He had a normal stress echo in May of 2013. In August 2015 he presented with atrial flutter with RVR. He had mildly elevated troponins. He had successful TEE guided DCCV. He then underwent ablation of atrial flutter and ectopic atrial tachycardia on 03/30/14 by Dr. [name].  Stress Myoview September 2015 demonstrated excellent exercise tolerance and normal perfusion. TEE showed normal LV function, mild biatrial enlargement, mild MR,TR,AI. He does have moderate obstructive sleep apnea followed by Dr. Radford Pax and is on CPAP therapy.  He was seen in December with  an irregular and fast pulse. Was found to be in AFib with RVR. Echo showed EF 40-45% with moderate MR and AI and moderate biatrial enlargement. Toprol dose was increased and he was started on Eliquis. Underwent TEE guided DCCV on 07/17/17. Echo showed EF 40% with moderate biatrial enlargement. Moderate to severe MR, moderate TR, patent foramen ovale. He was successfully cardioverted but had recurrence of Afib on 07/24/17. He was seen in Afib clinic with Dr. [name] for consideration of Afib ablation versus AAD therapy. There was discussion about starting him on Tikosyn. Given new LV dysfunction and valvular disease it was felt that right and left heart cath were indicated prior to initiation of AAD therapy.  He also wore a monitor that showed episodes of wide complex tachycardia and it was unclear whether this was aberrancy versus VT. On follow up today he reports feeling better. His HR is  typically in the 90s. He is back in the gym mostly walking on the flat. He has mild edema. SOB is improved. He wants to hold off on cardiac cath at this point - he is getting a second opinion at [facility] with Dr. [name]. He has not missed his anticoagulation.   Past Medical History:  Diagnosis Date  . Chicken pox   . Coronary artery disease    Severe three-vessel coronary artery disease with ejection fraction of 65%  . GERD (gastroesophageal reflux disease)   . History of gout   . History of psoriasis   . Hypercholesterolemia   . Hypertension   . OSA (obstructive sleep apnea)    Moderate with AHI of 16.9/hr now on CPAP at 10cm H2O  . Psoriatic arthritis [facility])     Past Surgical History:  Procedure Laterality Date  . ATRIAL FLUTTER ABLATION  03/30/2014  . ATRIAL FLUTTER ABLATION N/A 03/30/2014   Procedure: ATRIAL FLUTTER ABLATION;  Surgeon: Coralyn Mark, MD;  Location: [place] CATH LAB;  Service: Cardiovascular;  Laterality: N/A;  . CARDIAC CATHETERIZATION  09/2003   Ejection fraction was estimated at 65%.  . CARDIOVERSION N/A 03/15/2014   Procedure: CARDIOVERSION;  Surgeon: Lelon Perla, MD;  Location[facility] ENDOSCOPY;  Service: Cardiovascular;  Laterality: N/A;  . CARDIOVERSION N/A 07/17/2017   Procedure: CARDIOVERSION;  Surgeon: Sueanne Margarita, MD;  Location: [place];  Service: Cardiovascular;  Laterality: N/A;  .  CORONARY ARTERY BYPASS GRAFT  09/2003   Lima-lad,svg-diag,svg-om/distal LCX,svg-pda  . TEE WITHOUT CARDIOVERSION N/A 03/15/2014   Procedure: TRANSESOPHAGEAL ECHOCARDIOGRAM (TEE);  Surgeon: Lelon Perla, MD;  Location: [facility] ENDOSCOPY;  Service: Cardiovascular;  Laterality: N/A;  . TEE WITHOUT CARDIOVERSION N/A 07/17/2017   Procedure: TRANSESOPHAGEAL ECHOCARDIOGRAM (TEE);  Surgeon: Sueanne Margarita, MD;  Location: [facility] ENDOSCOPY;  Service: Cardiovascular;  Laterality: N/A;  . TONSILLECTOMY  1950's     Current Outpatient Medications  Medication Sig Dispense Refill  .  apixaban (ELIQUIS) 5 MG TABS tablet Take 1 tablet (5 mg total) by mouth 2 (two) times daily. 60 tablet 11  . atorvastatin (LIPITOR) 40 MG tablet TAKE 1 TABLET DAILY. 30 tablet 6  . cholecalciferol (VITAMIN D) 1000 UNITS tablet Take 1,000 Units by mouth daily.      . Coenzyme Q10 (CO Q 10 PO) Take 100 mg by mouth daily.     . Colchicine 0.6 MG CAPS Take 2 tablets by mouth 2 (two) times daily as needed.     . furosemide (LASIX) 20 MG tablet Take 20 mg by mouth 2 (two) times daily.    Marland Kitchen losartan (COZAAR) 50 MG tablet Take 1 tablet (50 mg total) by mouth daily. 90 tablet 3  . metoprolol succinate (TOPROL-XL) 50 MG 24 hr tablet Take 100 mg by mouth 2 (two) times daily. Take with or immediately following a meal.    . multivitamin (THERAGRAN) per tablet Take 1 tablet by mouth daily.      . nitroGLYCERIN (NITROSTAT) 0.4 MG SL tablet PLACE 1 TABLET UNDER THE TONGUE EVERY 5 MINUTES AS NEEDED FOR CHEST PAIN. 25 tablet 0  . potassium chloride SA (K-DUR,KLOR-CON) 20 MEQ tablet Take 1 tablet (20 mEq total) by mouth daily. 30 tablet 6  . tadalafil (CIALIS) 20 MG tablet Take 20 mg by mouth as directed.     No current facility-administered medications for this visit.     Allergies:   Patient has no known allergies.    Social History:  The patient  reports that he has quit smoking. His smoking use included cigarettes. He has a 2.50 pack-year smoking history. he has never used smokeless tobacco. He reports that he drinks about 2.4 oz of alcohol per week. He reports that he does not use drugs.   Family History:  The patient's family history includes Cancer in his mother; Heart disease in his mother; Stroke in his mother.    ROS:  Please see the history of present illness.   Otherwise, review of systems are positive for none.   All other systems are reviewed and negative.    PHYSICAL EXAM: VS:  BP 112/68   Pulse 80   Ht 6\' 1"  (1.854 m)   Wt 210 lb (95.3 kg)   BMI 27.71 kg/m  , BMI Body mass index is  27.71 kg/m. GENERAL:  Well appearing WM in NAD HEENT:  PERRL, EOMI, sclera are clear. Oropharynx is clear. NECK:  No jugular venous distention, carotid upstroke brisk and symmetric, no bruits, no thyromegaly or adenopathy LUNGS:  Clear to auscultation bilaterally CHEST:  Unremarkable HEART:  IRRR, pulse rate 80 by my exam.  PMI not displaced or sustained,S1 and S2 within normal limits, no S3, no S4: no clicks, no rubs, no murmurs ABD:  Soft, nontender. BS +, no masses or bruits. No hepatomegaly, no splenomegaly EXT:  2 + pulses throughout, no edema, no cyanosis no clubbing SKIN:  Warm and dry.  No rashes NEURO:  Alert and oriented x 3. Cranial nerves II through XII intact. PSYCH:  Cognitively intact   Recent Labs: 07/04/2017: ALT 36; BNP 421.5; TSH 0.956 08/13/2017: BUN 13; Creatinine, Ser 1.01; Hemoglobin 15.7; Platelets 163; Potassium 4.9; Sodium 140  Dated 05/08/17: normal CMET and CBC  Lipid Panel    Component Value Date/Time   CHOL 153 02/14/2017 0000   TRIG 65 02/14/2017 0000   HDL 71 02/14/2017 0000   CHOLHDL 2.2 02/14/2017 0000   CHOLHDL 3 11/23/2015 0837   VLDL 16.4 11/23/2015 0837   LDLCALC 69 02/14/2017 0000      Wt Readings from Last 3 Encounters:  08/19/17 210 lb (95.3 kg)  08/13/17 206 lb 9.6 oz (93.7 kg)  08/02/17 205 lb 3.2 oz (93.1 kg)      Other studies Reviewed: Additional studies/ records that were reviewed today include: none.  ETT 03/06/17: Study Highlights     Blood pressure demonstrated a hypertensive response to exercise.  Upsloping ST segment depression ST segment depression of 1 mm was noted during stress in the II, III, aVF, V4 and V5 leads, and returning to baseline after less than 1 minute of recovery.   1. Good exercise tolerance.  2. Upsloping ST depression in inferior leads and V4/V5.  This is nonspecific.   No evidence for ischemia.    Echo 07/08/17: Study Conclusions  - Left ventricle: The cavity size was normal. Wall  thickness was   increased in a pattern of moderate LVH. Systolic function was   mildly to moderately reduced. The estimated ejection fraction was   in the range of 40% to 45%. - Aortic valve: There was moderate regurgitation. - Mitral valve: There was moderate regurgitation. - Left atrium: The atrium was moderately dilated. - Right atrium: The atrium was moderately dilated.  TEE 07/17/17: Study Conclusions  - Left ventricle: The estimated ejection fraction was in the range   of 40% to 45%. Mild diffuse hypokinesis with no identifiable   regional variations. No evidence of thrombus. - Aortic valve: There was moderate perivalvular regurgitation. - Mitral valve: No evidence of vegetation. There was moderate to   severe regurgitation, with multiple jets. Diastolic regurgitation   was absent. - Left atrium: The atrium was moderately dilated. No evidence of   thrombus in the atrial cavity or appendage. No evidence of   thrombus in the atrial cavity or appendage. Emptying velocity was   reduced. - Right atrium: The atrium was moderately dilated. - Atrial septum: There was a medium-sized fenestrated patent   foramen ovale. A in the fossa ovalis region was present. There   was a moderate bidirectional shunt through a patent foramen   ovale, in the baseline state. - Tricuspid valve: No evidence of vegetation. There was moderate   regurgitation. - Pulmonic valve: No evidence of vegetation.  Impressions:  - Successful cardioversion. No cardiac source of emboli was   indentified.  Event monitor 07/25/17: Study Highlights   Atrial fibrillation Ventricular rates are frequently elevated Frequent nonsustained ventricular tachycardia as well as aberrantly conducted afib are note No sustained ventricular arrhythmias No prolonged pauses or AV block     ASSESSMENT AND PLAN:  1. Atrial fibriillation with RVR. Initially diagnosed in December.  Prior history of atrial flutter and atrial  tachycardia ablation in 2015. On anticoagulation with Eliquis 5 mg bid. [place] vasc score of 3. On higher Toprol XL dose with good rate control. S/p TEE guided DCCV but remained in normal rhythm for only about a week.  Consider initiation of Tikosyn therapy vs ablation - will await second opinion of EP at [facility]. Note QTc was prolonged on Ecg in Afib but post DCCV QTc was normal.   2. Coronary disease status post CABG in 2005. He is without chest pain. Marland Kitchen He had a normal Myoview study September 2015. Low risk ETT in August 2018. We will continue risk factor modification. It may be beneficial to consider cardiac cath - I don't think a Myoview would be very helpful. Again will await opinion at [facility].   3. Chronic systolic CHF. EF 40%. I suspect this is related to tachycardia mediated LV dysfunction. Now with MR and TR as well. Options include further evaluation with cardiac cath or waiting and reassess after 2-3 months with optimal rate control.  4. Wide complex tachycardia- aberrancy versus VT  5. Atrial flutter s/p ablation.   6. OSA- now on CPAP. Followed by Dr. Radford Pax.  7. Hyperlipidemia on statin.  8. HTN     Disposition:   FU with me in 2-3 months after opinion at [facility].  Signed, [name], MD  08/19/2017 12:46 PM    [place]

## 2017-08-14 NOTE — Telephone Encounter (Signed)
**Note De-Identified Marquies Wanat Obfuscation** Approval on PA for Tikosyn received Aidden Markovic fax from [place]. Approval good from 08/09/2017 until 07/22/2018.

## 2017-08-19 ENCOUNTER — Telehealth: Payer: Self-pay | Admitting: *Deleted

## 2017-08-19 ENCOUNTER — Ambulatory Visit: Payer: PPO | Admitting: Cardiology

## 2017-08-19 ENCOUNTER — Encounter: Payer: Self-pay | Admitting: Cardiology

## 2017-08-19 VITALS — BP 112/68 | HR 80 | Ht 73.0 in | Wt 210.0 lb

## 2017-08-19 DIAGNOSIS — I481 Persistent atrial fibrillation: Secondary | ICD-10-CM | POA: Diagnosis not present

## 2017-08-19 DIAGNOSIS — I2510 Atherosclerotic heart disease of native coronary artery without angina pectoris: Secondary | ICD-10-CM

## 2017-08-19 DIAGNOSIS — Z951 Presence of aortocoronary bypass graft: Secondary | ICD-10-CM | POA: Diagnosis not present

## 2017-08-19 DIAGNOSIS — I4892 Unspecified atrial flutter: Secondary | ICD-10-CM | POA: Diagnosis not present

## 2017-08-19 DIAGNOSIS — I10 Essential (primary) hypertension: Secondary | ICD-10-CM | POA: Diagnosis not present

## 2017-08-19 DIAGNOSIS — I4819 Other persistent atrial fibrillation: Secondary | ICD-10-CM

## 2017-08-19 NOTE — Telephone Encounter (Signed)
Copied from Dr Doug Sou 08/19/17 office note:   He wants to hold off on cardiac cath at this point - he is getting a second opinion at [facility] with Dr. [name].  Confirmed with Dr Doug Sou nurse pt wanted to cancel cardiac cath scheduled for 08/21/17, cath cancelled/[facility] cath lab.

## 2017-08-19 NOTE — Patient Instructions (Signed)
Good luck with your evaluation with Dr. [name]  I will see you back in 2-3 months.

## 2017-08-20 ENCOUNTER — Telehealth: Payer: Self-pay | Admitting: Cardiology

## 2017-08-20 NOTE — Telephone Encounter (Signed)
Please call,pt wanted his Cath postponed.He just wanted to make sure it was postponed and wanted to know if he needs to do anything else?

## 2017-08-20 NOTE — Telephone Encounter (Signed)
Returned call to patient 08/21/17 cardiac cath has been cancelled.

## 2017-08-21 ENCOUNTER — Ambulatory Visit (HOSPITAL_COMMUNITY): Admission: RE | Admit: 2017-08-21 | Payer: PPO | Source: Ambulatory Visit | Admitting: Cardiology

## 2017-08-21 ENCOUNTER — Encounter (HOSPITAL_COMMUNITY): Admission: RE | Payer: Self-pay | Source: Ambulatory Visit

## 2017-08-21 SURGERY — RIGHT/LEFT HEART CATH AND CORONARY/GRAFT ANGIOGRAPHY
Anesthesia: LOCAL

## 2017-09-03 DIAGNOSIS — Z5181 Encounter for therapeutic drug level monitoring: Secondary | ICD-10-CM | POA: Diagnosis not present

## 2017-09-03 DIAGNOSIS — Z87891 Personal history of nicotine dependence: Secondary | ICD-10-CM | POA: Diagnosis not present

## 2017-09-03 DIAGNOSIS — I739 Peripheral vascular disease, unspecified: Secondary | ICD-10-CM | POA: Diagnosis not present

## 2017-09-03 DIAGNOSIS — Z8249 Family history of ischemic heart disease and other diseases of the circulatory system: Secondary | ICD-10-CM | POA: Diagnosis not present

## 2017-09-03 DIAGNOSIS — I10 Essential (primary) hypertension: Secondary | ICD-10-CM | POA: Diagnosis not present

## 2017-09-03 DIAGNOSIS — I4891 Unspecified atrial fibrillation: Secondary | ICD-10-CM | POA: Diagnosis not present

## 2017-09-03 DIAGNOSIS — I5022 Chronic systolic (congestive) heart failure: Secondary | ICD-10-CM | POA: Diagnosis not present

## 2017-09-03 DIAGNOSIS — Z6827 Body mass index (BMI) 27.0-27.9, adult: Secondary | ICD-10-CM | POA: Diagnosis not present

## 2017-09-03 DIAGNOSIS — L4050 Arthropathic psoriasis, unspecified: Secondary | ICD-10-CM | POA: Diagnosis not present

## 2017-09-03 DIAGNOSIS — E7800 Pure hypercholesterolemia, unspecified: Secondary | ICD-10-CM | POA: Diagnosis not present

## 2017-09-03 DIAGNOSIS — G4733 Obstructive sleep apnea (adult) (pediatric): Secondary | ICD-10-CM | POA: Diagnosis not present

## 2017-09-03 DIAGNOSIS — I481 Persistent atrial fibrillation: Secondary | ICD-10-CM | POA: Diagnosis not present

## 2017-09-03 DIAGNOSIS — Z7901 Long term (current) use of anticoagulants: Secondary | ICD-10-CM | POA: Diagnosis not present

## 2017-09-03 DIAGNOSIS — K219 Gastro-esophageal reflux disease without esophagitis: Secondary | ICD-10-CM | POA: Diagnosis not present

## 2017-09-03 DIAGNOSIS — M109 Gout, unspecified: Secondary | ICD-10-CM | POA: Diagnosis not present

## 2017-09-03 DIAGNOSIS — Z951 Presence of aortocoronary bypass graft: Secondary | ICD-10-CM | POA: Diagnosis not present

## 2017-09-03 DIAGNOSIS — I4892 Unspecified atrial flutter: Secondary | ICD-10-CM | POA: Diagnosis not present

## 2017-09-03 DIAGNOSIS — E663 Overweight: Secondary | ICD-10-CM | POA: Diagnosis not present

## 2017-09-03 DIAGNOSIS — I5020 Unspecified systolic (congestive) heart failure: Secondary | ICD-10-CM | POA: Diagnosis not present

## 2017-09-03 DIAGNOSIS — I110 Hypertensive heart disease with heart failure: Secondary | ICD-10-CM | POA: Diagnosis not present

## 2017-09-03 DIAGNOSIS — E785 Hyperlipidemia, unspecified: Secondary | ICD-10-CM | POA: Diagnosis not present

## 2017-09-03 DIAGNOSIS — Z9889 Other specified postprocedural states: Secondary | ICD-10-CM | POA: Diagnosis not present

## 2017-09-03 DIAGNOSIS — I2510 Atherosclerotic heart disease of native coronary artery without angina pectoris: Secondary | ICD-10-CM | POA: Diagnosis not present

## 2017-09-05 MED ORDER — POTASSIUM CHLORIDE ER 20 MEQ PO TBCR
20.00 | EXTENDED_RELEASE_TABLET | ORAL | Status: DC
Start: ? — End: 2017-09-05

## 2017-09-05 MED ORDER — ATORVASTATIN CALCIUM 40 MG PO TABS
40.00 | ORAL_TABLET | ORAL | Status: DC
Start: 2017-09-05 — End: 2017-09-05

## 2017-09-05 MED ORDER — LIDOCAINE HCL 1 % IJ SOLN
.50 | INTRAMUSCULAR | Status: DC
Start: ? — End: 2017-09-05

## 2017-09-05 MED ORDER — LOSARTAN POTASSIUM 50 MG PO TABS
50.00 | ORAL_TABLET | ORAL | Status: DC
Start: 2017-09-06 — End: 2017-09-05

## 2017-09-05 MED ORDER — ACETAMINOPHEN 325 MG PO TABS
650.00 | ORAL_TABLET | ORAL | Status: DC
Start: ? — End: 2017-09-05

## 2017-09-05 MED ORDER — APIXABAN 5 MG PO TABS
5.00 | ORAL_TABLET | ORAL | Status: DC
Start: 2017-09-05 — End: 2017-09-05

## 2017-09-05 MED ORDER — CHOLECALCIFEROL 25 MCG (1000 UT) PO TABS
1000.00 | ORAL_TABLET | ORAL | Status: DC
Start: 2017-09-06 — End: 2017-09-05

## 2017-09-05 MED ORDER — METOPROLOL SUCCINATE ER 25 MG PO TB24
25.00 | ORAL_TABLET | ORAL | Status: DC
Start: 2017-09-05 — End: 2017-09-05

## 2017-09-05 MED ORDER — NITROGLYCERIN 0.4 MG SL SUBL
0.40 | SUBLINGUAL_TABLET | SUBLINGUAL | Status: DC
Start: ? — End: 2017-09-05

## 2017-09-05 MED ORDER — FUROSEMIDE 20 MG PO TABS
20.00 | ORAL_TABLET | ORAL | Status: DC
Start: 2017-09-05 — End: 2017-09-05

## 2017-09-05 MED ORDER — MULTI-VITAMINS PO TABS
1.00 | ORAL_TABLET | ORAL | Status: DC
Start: 2017-09-06 — End: 2017-09-05

## 2017-09-13 ENCOUNTER — Other Ambulatory Visit: Payer: Self-pay

## 2017-09-13 NOTE — Patient Outreach (Signed)
[facility]) Care Management  09/13/2017  Stark Aguinaga Moehle [birth year] [id number]    Referral received. No outreach warranted at this time. TOC will be completed by primary care provider office who will refer to [facility] care mgmt if needed.  Plan: RN CM will close case and notify care management assistant of case status.   Jone Baseman, RN, MSN [facility] Care Management Care Management Coordinator Direct Line [phone] Toll Free: [phone]  Fax: [phone]

## 2017-09-30 ENCOUNTER — Other Ambulatory Visit: Payer: Self-pay | Admitting: Family Medicine

## 2017-10-02 ENCOUNTER — Encounter: Payer: Self-pay | Admitting: Family Medicine

## 2017-10-02 ENCOUNTER — Ambulatory Visit (INDEPENDENT_AMBULATORY_CARE_PROVIDER_SITE_OTHER): Payer: PPO | Admitting: Family Medicine

## 2017-10-02 VITALS — BP 110/70 | HR 108 | Temp 98.3°F | Wt 197.4 lb

## 2017-10-02 DIAGNOSIS — R059 Cough, unspecified: Secondary | ICD-10-CM

## 2017-10-02 DIAGNOSIS — R05 Cough: Secondary | ICD-10-CM

## 2017-10-02 MED ORDER — HYDROCODONE-HOMATROPINE 5-1.5 MG/5ML PO SYRP: 5.0000 mL | ORAL_SOLUTION | Freq: Four times a day (QID) | ORAL | 0 refills | Status: AC | PRN

## 2017-10-02 NOTE — Patient Instructions (Signed)

## 2017-10-02 NOTE — Progress Notes (Signed)
Subjective:     Patient ID: [name] [name], male   DOB: 07-17-1951, 67 y.o.   MRN: [id number]  HPI Patient here with chief complaint of cough. He developed nasal congestion Sunday followed  by cough occasionally productive of yellow sputum. No fever. He has some diffuse body aches. He is concerned because he has cardiac ablation procedure scheduled at [facility] in about 2 weeks. No chest pain. No nausea or vomiting. Cough especially bothersome at night.  Past Medical History:  Diagnosis Date  . Chicken pox   . Coronary artery disease    Severe three-vessel coronary artery disease with ejection fraction of 65%  . GERD (gastroesophageal reflux disease)   . History of gout   . History of psoriasis   . Hypercholesterolemia   . Hypertension   . OSA (obstructive sleep apnea)    Moderate with AHI of 16.9/hr now on CPAP at 10cm H2O  . Psoriatic arthritis [facility])    Past Surgical History:  Procedure Laterality Date  . ATRIAL FLUTTER ABLATION  03/30/2014  . ATRIAL FLUTTER ABLATION N/A 03/30/2014   Procedure: ATRIAL FLUTTER ABLATION;  Surgeon: Coralyn Mark, MD;  Location: [place] CATH LAB;  Service: Cardiovascular;  Laterality: N/A;  . CARDIAC CATHETERIZATION  09/2003   Ejection fraction was estimated at 65%.  . CARDIOVERSION N/A 03/15/2014   Procedure: CARDIOVERSION;  Surgeon: Lelon Perla, MD;  Location: [facility] ENDOSCOPY;  Service: Cardiovascular;  Laterality: N/A;  . CARDIOVERSION N/A 07/17/2017   Procedure: CARDIOVERSION;  Surgeon: Sueanne Margarita, MD;  Location: [place];  Service: Cardiovascular;  Laterality: N/A;  . CORONARY ARTERY BYPASS GRAFT  09/2003   Lima-lad,svg-diag,svg-om/distal LCX,svg-pda  . TEE WITHOUT CARDIOVERSION N/A 03/15/2014   Procedure: TRANSESOPHAGEAL ECHOCARDIOGRAM (TEE);  Surgeon: Lelon Perla, MD;  Location: [facility] ENDOSCOPY;  Service: Cardiovascular;  Laterality: N/A;  . TEE WITHOUT CARDIOVERSION N/A 07/17/2017   Procedure: TRANSESOPHAGEAL ECHOCARDIOGRAM (TEE);  Surgeon:  Sueanne Margarita, MD;  Location: [facility] ENDOSCOPY;  Service: Cardiovascular;  Laterality: N/A;  . TONSILLECTOMY  1950's    reports that he has quit smoking. His smoking use included cigarettes. He has a 2.50 pack-year smoking history. he has never used smokeless tobacco. He reports that he drinks about 2.4 oz of alcohol per week. He reports that he does not use drugs. family history includes Cancer in his mother; Heart disease in his mother; Stroke in his mother. No Known Allergies   Review of Systems  Constitutional: Negative for chills and fever.  HENT: Positive for congestion.   Respiratory: Positive for cough. Negative for shortness of breath and wheezing.        Objective:   Physical Exam  Constitutional: He appears well-developed and well-nourished.  HENT:  Right Ear: External ear normal.  Left Ear: External ear normal.  Mouth/Throat: Oropharynx is clear and moist.  Neck: Neck supple.  Cardiovascular: Normal rate.  Pulmonary/Chest: Effort normal and breath sounds normal. No respiratory distress. He has no wheezes. He has no rales.  Lymphadenopathy:    He has no cervical adenopathy.       Assessment:     Cough. Suspect acute viral bronchitis. Nonfocal exam.    Plan:     -Hycodan cough syrup 1 teaspoon every 6 hours when necessary for severe cough -Follow-up immediately for any increased shortness of breath or other concerns.  [name] MD [place] [facility] at [facility]

## 2017-10-11 ENCOUNTER — Encounter: Payer: Self-pay | Admitting: Cardiology

## 2017-10-14 DIAGNOSIS — I517 Cardiomegaly: Secondary | ICD-10-CM | POA: Diagnosis not present

## 2017-10-14 DIAGNOSIS — I4891 Unspecified atrial fibrillation: Secondary | ICD-10-CM | POA: Diagnosis not present

## 2017-10-14 DIAGNOSIS — I481 Persistent atrial fibrillation: Secondary | ICD-10-CM | POA: Diagnosis not present

## 2017-10-14 DIAGNOSIS — Z951 Presence of aortocoronary bypass graft: Secondary | ICD-10-CM | POA: Diagnosis not present

## 2017-10-14 DIAGNOSIS — I10 Essential (primary) hypertension: Secondary | ICD-10-CM | POA: Diagnosis not present

## 2017-10-14 DIAGNOSIS — Z7901 Long term (current) use of anticoagulants: Secondary | ICD-10-CM | POA: Diagnosis not present

## 2017-10-14 DIAGNOSIS — G4733 Obstructive sleep apnea (adult) (pediatric): Secondary | ICD-10-CM | POA: Diagnosis not present

## 2017-10-14 DIAGNOSIS — Z8679 Personal history of other diseases of the circulatory system: Secondary | ICD-10-CM | POA: Diagnosis not present

## 2017-10-14 DIAGNOSIS — I4892 Unspecified atrial flutter: Secondary | ICD-10-CM | POA: Diagnosis not present

## 2017-10-14 DIAGNOSIS — E663 Overweight: Secondary | ICD-10-CM | POA: Diagnosis not present

## 2017-10-14 DIAGNOSIS — Z79899 Other long term (current) drug therapy: Secondary | ICD-10-CM | POA: Diagnosis not present

## 2017-10-14 DIAGNOSIS — I2510 Atherosclerotic heart disease of native coronary artery without angina pectoris: Secondary | ICD-10-CM | POA: Diagnosis not present

## 2017-10-15 DIAGNOSIS — I2510 Atherosclerotic heart disease of native coronary artery without angina pectoris: Secondary | ICD-10-CM | POA: Diagnosis not present

## 2017-10-15 DIAGNOSIS — K219 Gastro-esophageal reflux disease without esophagitis: Secondary | ICD-10-CM | POA: Diagnosis not present

## 2017-10-15 DIAGNOSIS — E785 Hyperlipidemia, unspecified: Secondary | ICD-10-CM | POA: Diagnosis not present

## 2017-10-15 DIAGNOSIS — Z79899 Other long term (current) drug therapy: Secondary | ICD-10-CM | POA: Diagnosis not present

## 2017-10-15 DIAGNOSIS — G4733 Obstructive sleep apnea (adult) (pediatric): Secondary | ICD-10-CM | POA: Diagnosis not present

## 2017-10-15 DIAGNOSIS — Z7901 Long term (current) use of anticoagulants: Secondary | ICD-10-CM | POA: Diagnosis not present

## 2017-10-15 DIAGNOSIS — I5022 Chronic systolic (congestive) heart failure: Secondary | ICD-10-CM | POA: Diagnosis not present

## 2017-10-15 DIAGNOSIS — Z9889 Other specified postprocedural states: Secondary | ICD-10-CM | POA: Diagnosis not present

## 2017-10-15 DIAGNOSIS — I110 Hypertensive heart disease with heart failure: Secondary | ICD-10-CM | POA: Diagnosis not present

## 2017-10-15 DIAGNOSIS — I083 Combined rheumatic disorders of mitral, aortic and tricuspid valves: Secondary | ICD-10-CM | POA: Diagnosis not present

## 2017-10-15 DIAGNOSIS — I481 Persistent atrial fibrillation: Secondary | ICD-10-CM | POA: Diagnosis not present

## 2017-10-15 DIAGNOSIS — I4892 Unspecified atrial flutter: Secondary | ICD-10-CM | POA: Diagnosis not present

## 2017-10-15 DIAGNOSIS — L4050 Arthropathic psoriasis, unspecified: Secondary | ICD-10-CM | POA: Diagnosis not present

## 2017-10-15 DIAGNOSIS — I10 Essential (primary) hypertension: Secondary | ICD-10-CM | POA: Diagnosis not present

## 2017-10-15 DIAGNOSIS — Z87891 Personal history of nicotine dependence: Secondary | ICD-10-CM | POA: Diagnosis not present

## 2017-10-15 DIAGNOSIS — Z951 Presence of aortocoronary bypass graft: Secondary | ICD-10-CM | POA: Diagnosis not present

## 2017-10-16 ENCOUNTER — Telehealth: Payer: Self-pay | Admitting: Cardiology

## 2017-10-16 DIAGNOSIS — I4892 Unspecified atrial flutter: Secondary | ICD-10-CM | POA: Diagnosis not present

## 2017-10-16 DIAGNOSIS — I481 Persistent atrial fibrillation: Secondary | ICD-10-CM | POA: Diagnosis not present

## 2017-10-16 DIAGNOSIS — I4891 Unspecified atrial fibrillation: Secondary | ICD-10-CM | POA: Diagnosis not present

## 2017-10-16 NOTE — Telephone Encounter (Signed)
Records received from [place] on 10/16/17, Appt 10/22/17 w/ [name] @ 10:20am. [place]

## 2017-10-18 ENCOUNTER — Ambulatory Visit (INDEPENDENT_AMBULATORY_CARE_PROVIDER_SITE_OTHER): Payer: PPO | Admitting: Family Medicine

## 2017-10-18 ENCOUNTER — Encounter: Payer: Self-pay | Admitting: Family Medicine

## 2017-10-18 VITALS — BP 98/70 | HR 70 | Temp 98.0°F | Ht 73.0 in | Wt 199.2 lb

## 2017-10-18 DIAGNOSIS — R059 Cough, unspecified: Secondary | ICD-10-CM

## 2017-10-18 DIAGNOSIS — R05 Cough: Secondary | ICD-10-CM | POA: Diagnosis not present

## 2017-10-18 MED ORDER — HYDROCODONE-HOMATROPINE 5-1.5 MG/5ML PO SYRP: 5.0000 mL | ORAL_SOLUTION | Freq: Four times a day (QID) | ORAL | 0 refills | Status: AC | PRN

## 2017-10-18 MED ORDER — DOXYCYCLINE HYCLATE 100 MG PO CAPS: 100.0000 mg | ORAL_CAPSULE | Freq: Two times a day (BID) | ORAL | 0 refills | Status: DC

## 2017-10-18 NOTE — Patient Instructions (Signed)
Follow up for any fever or increased shortness of breath. 

## 2017-10-18 NOTE — Progress Notes (Signed)
Subjective:     Patient ID: [name] [name], male   DOB: 03-Dec-1950, 67 y.o.   MRN: [id number]  HPI Patient seen with persistent cough. Now about 3 weeks duration.  Had cough and was seen here couple weeks ago. We felt this was a viral process. He went to [facility] this past Tuesday for cardiac ablation procedure. They did chest x-ray which was unremarkable. He had intubation. He has not had any fever. No dyspnea. Cough productive of yellow sputum. Does have some postnasal drip symptoms. No obvious GERD symptoms. He has done well since the ablation. No chest pain  Past Medical History:  Diagnosis Date  . Chicken pox   . Coronary artery disease    Severe three-vessel coronary artery disease with ejection fraction of 65%  . GERD (gastroesophageal reflux disease)   . History of gout   . History of psoriasis   . Hypercholesterolemia   . Hypertension   . OSA (obstructive sleep apnea)    Moderate with AHI of 16.9/hr now on CPAP at 10cm H2O  . Psoriatic arthritis [facility])    Past Surgical History:  Procedure Laterality Date  . ATRIAL FLUTTER ABLATION  03/30/2014  . ATRIAL FLUTTER ABLATION N/A 03/30/2014   Procedure: ATRIAL FLUTTER ABLATION;  Surgeon: Coralyn Mark, MD;  Location: [place] CATH LAB;  Service: Cardiovascular;  Laterality: N/A;  . CARDIAC CATHETERIZATION  09/2003   Ejection fraction was estimated at 65%.  . CARDIOVERSION N/A 03/15/2014   Procedure: CARDIOVERSION;  Surgeon: Lelon Perla, MD;  Location: [facility] ENDOSCOPY;  Service: Cardiovascular;  Laterality: N/A;  . CARDIOVERSION N/A 07/17/2017   Procedure: CARDIOVERSION;  Surgeon: Sueanne Margarita, MD;  Location: [place];  Service: Cardiovascular;  Laterality: N/A;  . CORONARY ARTERY BYPASS GRAFT  09/2003   Lima-lad,svg-diag,svg-om/distal LCX,svg-pda  . TEE WITHOUT CARDIOVERSION N/A 03/15/2014   Procedure: TRANSESOPHAGEAL ECHOCARDIOGRAM (TEE);  Surgeon: Lelon Perla, MD;  Location: [facility] ENDOSCOPY;  Service: Cardiovascular;  Laterality: N/A;   . TEE WITHOUT CARDIOVERSION N/A 07/17/2017   Procedure: TRANSESOPHAGEAL ECHOCARDIOGRAM (TEE);  Surgeon: Sueanne Margarita, MD;  Location: [facility] ENDOSCOPY;  Service: Cardiovascular;  Laterality: N/A;  . TONSILLECTOMY  1950's    reports that he has quit smoking. His smoking use included cigarettes. He has a 2.50 pack-year smoking history. He has never used smokeless tobacco. He reports that he drinks about 2.4 oz of alcohol per week. He reports that he does not use drugs. family history includes Cancer in his mother; Heart disease in his mother; Stroke in his mother. No Known Allergies   Review of Systems  Constitutional: Negative for chills and fever.  HENT: Positive for postnasal drip.   Respiratory: Positive for cough. Negative for shortness of breath and wheezing.   Cardiovascular: Negative for chest pain.       Objective:   Physical Exam  Constitutional: He appears well-developed and well-nourished.  HENT:  Right Ear: External ear normal.  Left Ear: External ear normal.  Mouth/Throat: Oropharynx is clear and moist.  Neck: Neck supple.  Cardiovascular: Normal rate and regular rhythm.  Pulmonary/Chest: Effort normal and breath sounds normal. No respiratory distress. He has no wheezes. He has no rales.  Lymphadenopathy:    He has no cervical adenopathy.       Assessment:     Persistent cough now productive with unremarkable chest x-ray few days ago.  Does describe some postnasal drip symptoms which could be contributing. Question allergic rhinitis    Plan:     -  We elected to go and cover with doxycycline 100 mg twice daily for 7 days -Consider over-the-counter Flonase or Nasacort -Follow-up immediately for any fever or worsening symptoms -Refilled Hycodan cough syrup to use at night for nighttime coughing  [name] MD [facility] [facility] at [facility]

## 2017-10-21 NOTE — Progress Notes (Signed)
Cardiology Office Note   Date:  10/22/2017   ID:  [name] [name] October 24, 1950, MRN [id number]  PCP:  [name] Post, MD  Cardiologist:   [name] Pruitt Martinique, MD   Chief Complaint  Patient presents with  . Follow-up  . Atrial Fibrillation      History of Present Illness: [name] [name] is a 67 y.o. male who is seen for follow up Afib and CHF. He has a history of  atrial flutter and CAD. He is status [name] CABG in 2005. He had a normal stress echo in May of 2013. In August 2015 he presented with atrial flutter with RVR. He had mildly elevated troponins. He had successful TEE guided DCCV. He then underwent ablation of atrial flutter and ectopic atrial tachycardia on 03/30/14 by Dr. Rayann [name].  Stress Myoview September 2015 demonstrated excellent exercise tolerance and normal perfusion. TEE showed normal LV function, mild biatrial enlargement, mild MR,TR,AI. He does have moderate obstructive sleep apnea followed by Dr. Radford [name] and is on CPAP therapy.  He was seen in December with  an irregular and fast pulse. Was found to be in AFib with RVR. Echo showed EF 40-45% with moderate MR and AI and moderate biatrial enlargement. Toprol dose was increased and he was started on Eliquis. Underwent TEE guided DCCV on 07/17/17. Echo showed EF 40% with moderate biatrial enlargement. Moderate to severe MR, moderate TR, patent foramen ovale. He was successfully cardioverted but had recurrence of Afib on 07/24/17. He was seen in Afib clinic with Dr. Rayann [name] for consideration of Afib ablation versus AAD therapy. There was discussion about starting him on Tikosyn. Given new LV dysfunction and valvular disease it was felt that right and left heart cath were indicated prior to initiation of AAD therapy.  He also wore a monitor that showed episodes of wide complex tachycardia and it was unclear whether this was aberrancy versus VT. After our last visit he got a  second opinion at [place] with Dr. Lurene [name]. He was admitted in  February and had a nuclear stress test showing no ischemia. EF 44%. He was loaded with Tikosyn and DCCV was performed. he had significant QT prolongation and Tikosyn was discontinued. He later underwent Cardiac MRI. He underwent AFib ablation with pulmonary vein isolation on 10/15/17. Echo following ablation as noted below.  Both MRI and Echo showed evidence of moderate to severe AI. LV function had returned to normal with restoration of NSR.  Since ablation he did note some Afib this past weekend. Took an extra metoprolol and later returned to NSR. He also has an upper respiratory illness and is on doxycycline. He denies any dyspnea or chest pain. No edema.      Past Medical History:  Diagnosis Date  . Chicken pox   . Coronary artery disease    Severe three-vessel coronary artery disease with ejection fraction of 65%  . GERD (gastroesophageal reflux disease)   . History of gout   . History of psoriasis   . Hypercholesterolemia   . Hypertension   . OSA (obstructive sleep apnea)    Moderate with AHI of 16.9/hr now on CPAP at 10cm H2O  . Psoriatic arthritis [facility])     Past Surgical History:  Procedure Laterality Date  . ATRIAL FLUTTER ABLATION  03/30/2014  . ATRIAL FLUTTER ABLATION N/A 03/30/2014   Procedure: ATRIAL FLUTTER ABLATION;  Surgeon: [name] Mark, MD;  Location: [place] CATH LAB;  Service: Cardiovascular;  Laterality: N/A;  . CARDIAC CATHETERIZATION  09/2003   Ejection fraction was estimated at 65%.  . CARDIOVERSION N/A 03/15/2014   Procedure: CARDIOVERSION;  Surgeon: [name] Perla, MD;  Location: [facility] ENDOSCOPY;  Service: Cardiovascular;  Laterality: N/A;  . CARDIOVERSION N/A 07/17/2017   Procedure: CARDIOVERSION;  Surgeon: [name] Margarita, MD;  Location: [place];  Service: Cardiovascular;  Laterality: N/A;  . CORONARY ARTERY BYPASS GRAFT  09/2003   [place]-lad,svg-diag,svg-om/distal LCX,svg-pda  . TEE WITHOUT CARDIOVERSION N/A 03/15/2014   Procedure: TRANSESOPHAGEAL  ECHOCARDIOGRAM (TEE);  Surgeon: [name] Perla, MD;  Location: [facility] ENDOSCOPY;  Service: Cardiovascular;  Laterality: N/A;  . TEE WITHOUT CARDIOVERSION N/A 07/17/2017   Procedure: TRANSESOPHAGEAL ECHOCARDIOGRAM (TEE);  Surgeon: [name] Margarita, MD;  Location: [facility] ENDOSCOPY;  Service: Cardiovascular;  Laterality: N/A;  . TONSILLECTOMY  1950's     Current Outpatient Medications  Medication Sig Dispense Refill  . apixaban (ELIQUIS) 5 MG TABS tablet Take 1 tablet (5 mg total) by mouth 2 (two) times daily. 60 tablet 11  . atorvastatin (LIPITOR) 40 MG tablet TAKE 1 TABLET DAILY. 30 tablet 6  . cholecalciferol (VITAMIN D) 1000 UNITS tablet Take 1,000 Units by mouth daily.      . Coenzyme Q10 (CO Q 10 PO) Take 100 mg by mouth daily.     . colchicine 0.6 MG tablet TAKE 1 TABLET BY MOUTH TWICE DAILY. 90 tablet 1  . doxycycline (VIBRAMYCIN) 100 MG capsule Take 1 capsule (100 mg total) by mouth 2 (two) times daily. 14 capsule 0  . furosemide (LASIX) 20 MG tablet Take 20 mg by mouth daily.    Marland Kitchen HYDROcodone-homatropine (HYCODAN) 5-1.5 MG/5ML syrup Take 5 mLs by mouth every 6 (six) hours as needed for up to 10 days. 120 mL 0  . losartan (COZAAR) 50 MG tablet Take 1 tablet (50 mg total) by mouth daily. 90 tablet 3  . metoprolol succinate (TOPROL-XL) 25 MG 24 hr tablet Take by mouth. One tab in the AM and one tab in the PM    . multivitamin [facility]) per tablet Take 1 tablet by mouth daily.      . nitroGLYCERIN (NITROSTAT) 0.4 MG SL tablet PLACE 1 TABLET UNDER THE TONGUE EVERY 5 MINUTES AS NEEDED FOR CHEST PAIN. 25 tablet 0  . Omeprazole (PRILOSEC PO) Take by mouth 2 (two) times daily.    . potassium chloride SA (K-DUR,KLOR-CON) 20 MEQ tablet Take 1 tablet (20 mEq total) by mouth daily. 30 tablet 6  . tadalafil (CIALIS) 20 MG tablet Take 20 mg by mouth as directed.     No current facility-administered medications for this visit.     Allergies:   Patient has no known allergies.    Social History:  The  patient  reports that he has quit smoking. His smoking use included cigarettes. He has a 2.50 pack-year smoking history. He has never used smokeless tobacco. He reports that he drinks about 2.4 oz of alcohol per week. He reports that he does not use drugs.   Family History:  The patient's family history includes Cancer in his mother; Heart disease in his mother; Stroke in his mother.    ROS:  Please see the history of present illness.   Otherwise, review of systems are positive for none.   All other systems are reviewed and negative.    PHYSICAL EXAM: VS:  BP 117/62   Pulse 65   Ht 6\' 1"  (1.854 m)   Wt 198 lb 9.6 oz (90.1 kg)   BMI 26.20 kg/m  ,  BMI Body mass index is 26.2 kg/m. GENERAL:  Well appearing WM in NAD HEENT:  PERRL, EOMI, sclera are clear. Oropharynx is clear. NECK:  No jugular venous distention, carotid upstroke brisk and symmetric, no bruits, no thyromegaly or adenopathy LUNGS:  Clear to auscultation bilaterally CHEST:  Unremarkable HEART:  RRR with occ. Extrasystole,  PMI not displaced or sustained,S1 and S2 within normal limits, no S3, no S4: no clicks, no rubs, no murmurs ABD:  Soft, nontender. BS +, no masses or bruits. No hepatomegaly, no splenomegaly EXT:  2 + pulses throughout, no edema, no cyanosis no clubbing SKIN:  Warm and dry.  No rashes NEURO:  Alert and oriented x 3. Cranial nerves II through XII intact. PSYCH:  Cognitively intact   Recent Labs: 07/04/2017: ALT 36; BNP 421.5; TSH 0.956 08/13/2017: BUN 13; Creatinine, Ser 1.01; Hemoglobin 15.7; Platelets 163; Potassium 4.9; Sodium 140  Dated 05/08/17: normal CMET and CBC  Lipid Panel    Component Value Date/Time   CHOL 153 02/14/2017 0000   TRIG 65 02/14/2017 0000   HDL 71 02/14/2017 0000   CHOLHDL 2.2 02/14/2017 0000   CHOLHDL 3 11/23/2015 0837   VLDL 16.4 11/23/2015 0837   LDLCALC 69 02/14/2017 0000      Wt Readings from Last 3 Encounters:  10/22/17 198 lb 9.6 oz (90.1 kg)  10/18/17 199 lb  3.2 oz (90.4 kg)  10/02/17 197 lb 6.4 oz (89.5 kg)      Other studies Reviewed: Additional studies/ records that were reviewed today include: none.  ETT 03/06/17: Study Highlights     Blood pressure demonstrated a hypertensive response to exercise.  Upsloping ST segment depression ST segment depression of 1 mm was noted during stress in the II, III, aVF, V4 and V5 leads, and returning to baseline after less than 1 minute of recovery.   1. Good exercise tolerance.  2. Upsloping ST depression in inferior leads and V4/V5.  This is nonspecific.   No evidence for ischemia.    Echo 07/08/17: Study Conclusions  - Left ventricle: The cavity size was normal. Wall thickness was   increased in a pattern of moderate LVH. Systolic function was   mildly to moderately reduced. The estimated ejection fraction was   in the range of 40% to 45%. - Aortic valve: There was moderate regurgitation. - Mitral valve: There was moderate regurgitation. - Left atrium: The atrium was moderately dilated. - Right atrium: The atrium was moderately dilated.  TEE 07/17/17: Study Conclusions  - Left ventricle: The estimated ejection fraction was in the range   of 40% to 45%. Mild diffuse hypokinesis with no identifiable   regional variations. No evidence of thrombus. - Aortic valve: There was moderate perivalvular regurgitation. - Mitral valve: No evidence of vegetation. There was moderate to   severe regurgitation, with multiple jets. Diastolic regurgitation   was absent. - Left atrium: The atrium was moderately dilated. No evidence of   thrombus in the atrial cavity or appendage. No evidence of   thrombus in the atrial cavity or appendage. Emptying velocity was   reduced. - Right atrium: The atrium was moderately dilated. - Atrial septum: There was a medium-sized fenestrated patent   foramen ovale. A in the fossa ovalis region was present. There   was a moderate bidirectional shunt through a patent  foramen   ovale, in the baseline state. - Tricuspid valve: No evidence of vegetation. There was moderate   regurgitation. - Pulmonic valve: No evidence of vegetation.  Impressions:  - Successful cardioversion. No cardiac source of emboli was   indentified.  Event monitor 07/25/17: Study Highlights   Atrial fibrillation Ventricular rates are frequently elevated Frequent nonsustained ventricular tachycardia as well as aberrantly conducted afib are note No sustained ventricular arrhythmias No prolonged pauses or AV block   Cardiac MRI 10/14/17: Cardiac MRI and thoracic MRA with and without contrast was performed on a 1.5 T MRI scanner to evaluate myocardial morphology, function, viability and assess pulmonary vein anatomy in a patient with hx of GERD, HTN, hypercholesterolemia, OSA on CPAP, CAD s/p CABG (LIMA to LAD, svg to D1, sequential svg to OM and distal LCx, and svg to PDA), atrial flutter s/p ablation, atrial fibrillation, and wide complex tachycardia thought to due to SVT with aberrancy.Transthoracic echo demonstrated an LVEF of 40-45%, normal cavity size, moderate MR, and moderate AR.Transesophageal echo demonstrated moderate aortic regurgitation, moderate-severe mitral regurgitation, and a PFO.Nuclear stress demonstrated a mildly dilated LV with no perfusion defect and an EF of 44%.The patient is scheduled for ablation for atrial fibrillation.  Cardiac MRI 1. The left ventricle is moderately dilated.Wall thickness is normal.Regional and global LV systolic function are low normal.The LVEF is calculated at 59%.  2. The right ventricle is normal in cavity size and wall thickness.Global RV systolic function is low normal to mildly reduced.The RVEF is calculated at 50%.  3. Both atria are severely enlarged.  4. The aortic valve is trileaflet in morphology. There is no significant aortic stenosis. There is moderate-severe aortic regurgitation.The regurgitant  orifice area measures 0.26 cm^2.There is reversal of flow in the proximal descending thoracic aorta.  5.There ismild-moderate mitral regurgitation.There is moderate tricuspid regurgitation.There is mild pulmonic regurgitation.  6. Delayed enhancement imaging demonstrates no evidence of myocardial infarction, scarring,or infiltration.  7.There is no evidence of an intracardiac thrombus.  8.The pericardium is normal in thickness.There is no significant pericardial effusion.  Thoracic MRA 1. There are 3 right sided pulmonary veins.The right lower and right middle pulmonary veins enter the LA immediately adjacent to one another. The right middle vein is small.  The 2 left sided pulmonary veins enter the LA normally.  All veins are patent without significant stenosis proximally.   MRA bi-orthogonal luminal dimensions are listed below: RUPV: 2.0 x 1.8 cm, 76 cm/sec RMPV: 0.6 x 0.5 cm RLPV: 2.0 x 1.7 cm, 60 cm/ sec LUPV: 1.9 x 1.0 cm, 59 cm/ sec LLPV: 1.9 x 0.9 cm, 60 cm/ sec  MRA LA dimensions Head-foot: 7.1 cm Right-left:6.2 cm Anterior-posterior:4.3 cm  2. The thoracic aorta is normal in diameter. There is no evidence of a dissection flap.  3. The main and proximal branch pulmonary arteries are normal in size  4. Systemic venous connections are normal.  5. The esophagus is posterior of the left atrium in close proximity to the ostia of the left-sided pulmonary veins.The descending thoracic aorta is behind the esophagus and behind the left sided pulmonary Veins.  Echo 10/16/17: INTERPRETATION --------------------------------------------------------------- NORMAL LEFT VENTRICULAR SYSTOLIC FUNCTION WITH MILD LVH NORMAL LA PRESSURES WITH DIASTOLIC DYSFUNCTION NORMAL RIGHT VENTRICULAR SYSTOLIC FUNCTION VALVULAR REGURGITATION: MODERATE AR, MILD MR, TRIVIAL PR, MILD TR NO VALVULAR STENOSIS IRREGULAR RHYTHM Moderate to severe aortic  regurgitation. Mild left ventricular enlargement suggests color Doppler may underestimate aortic regurgitation severity. AR VENA CONTRACTA=0.5CM MILD TO MODERATE TR NO PRIOR STUDY FOR COMPARISON   ASSESSMENT AND PLAN:  1. Atrial fibriillation with RVR. Initially diagnosed in December.  Prior history of atrial flutter and atrial tachycardia ablation in  2015. On anticoagulation with Eliquis 5 mg bid. [place] vasc score of 3. On higher Toprol XL dose. S/p TEE guided DCCV but remained in normal rhythm for only about a week. Failed Tikosyn due to QT prolongation. Now s/p Afib ablation at [facility] on 10/15/17. Recommend long term anticoagulation. Will follow up in 3 months.   2. Coronary disease status [name] CABG in 2005. He is without chest pain. Marland Kitchen He had a normal Myoview study September 2015. Low risk ETT in August 2018. Repeat nuclear  study at [facility] showed no ischemia. EF 44%. We will continue risk factor modification.   3. Chronic systolic CHF. EF 40-45%. I suspect this is related to tachycardia mediated LV dysfunction since LV function was normal on MRI and Echo.   4. Wide complex tachycardia- aberrancy versus VT- will monitor.  5. Atrial flutter s/p ablation.   6. OSA- now on CPAP. Followed by Dr. Radford [name].  7. Hyperlipidemia on statin.  8. Moderate to severe AI. Asymptomatic and LV function is back to normal. Exam is really unremarkable. No widened pulse pressure. Recommend repeat Echo in 6 months.  9. HTN controlled.     Disposition:   FU with me in 3 months  Signed, Odesser Tourangeau Martinique, MD  10/22/2017 11:15 AM    [place]

## 2017-10-22 ENCOUNTER — Encounter: Payer: Self-pay | Admitting: Cardiology

## 2017-10-22 ENCOUNTER — Ambulatory Visit: Payer: PPO | Admitting: Cardiology

## 2017-10-22 VITALS — BP 117/62 | HR 65 | Ht 73.0 in | Wt 198.6 lb

## 2017-10-22 DIAGNOSIS — I4819 Other persistent atrial fibrillation: Secondary | ICD-10-CM

## 2017-10-22 DIAGNOSIS — I481 Persistent atrial fibrillation: Secondary | ICD-10-CM

## 2017-10-22 DIAGNOSIS — I2510 Atherosclerotic heart disease of native coronary artery without angina pectoris: Secondary | ICD-10-CM

## 2017-10-22 DIAGNOSIS — Z951 Presence of aortocoronary bypass graft: Secondary | ICD-10-CM

## 2017-10-22 DIAGNOSIS — I351 Nonrheumatic aortic (valve) insufficiency: Secondary | ICD-10-CM

## 2017-10-22 DIAGNOSIS — I10 Essential (primary) hypertension: Secondary | ICD-10-CM | POA: Diagnosis not present

## 2017-10-22 NOTE — Patient Instructions (Signed)
Reduce lasix to once a day  Continue your other therapy  I will see you in 3 months

## 2017-11-28 ENCOUNTER — Telehealth: Payer: Self-pay | Admitting: Family Medicine

## 2017-11-28 NOTE — Telephone Encounter (Signed)
Could try Dr Maureen Ralphs or Dr Theda Sers with [facility].

## 2017-11-28 NOTE — Telephone Encounter (Signed)
Copied from [place]. Topic: Quick Communication - See Telephone Encounter >> Nov 27, 2017  4:55 PM Neva Seat wrote: Pt needing a recommendation on a knee orthopedic dr he can go to.   Please call pt asap.

## 2017-11-28 NOTE — Telephone Encounter (Signed)
I left a detailed message with the information below at the pts cell number. 

## 2017-11-29 DIAGNOSIS — D1801 Hemangioma of skin and subcutaneous tissue: Secondary | ICD-10-CM | POA: Diagnosis not present

## 2017-11-29 DIAGNOSIS — Z85820 Personal history of malignant melanoma of skin: Secondary | ICD-10-CM | POA: Diagnosis not present

## 2017-11-29 DIAGNOSIS — L400 Psoriasis vulgaris: Secondary | ICD-10-CM | POA: Diagnosis not present

## 2017-11-29 DIAGNOSIS — L814 Other melanin hyperpigmentation: Secondary | ICD-10-CM | POA: Diagnosis not present

## 2017-11-29 DIAGNOSIS — L821 Other seborrheic keratosis: Secondary | ICD-10-CM | POA: Diagnosis not present

## 2017-12-01 ENCOUNTER — Other Ambulatory Visit: Payer: Self-pay | Admitting: Cardiology

## 2017-12-03 ENCOUNTER — Other Ambulatory Visit: Payer: Self-pay | Admitting: Cardiology

## 2017-12-13 ENCOUNTER — Other Ambulatory Visit: Payer: Self-pay | Admitting: Family Medicine

## 2018-01-02 ENCOUNTER — Other Ambulatory Visit: Payer: Self-pay | Admitting: Cardiology

## 2018-01-20 DIAGNOSIS — I481 Persistent atrial fibrillation: Secondary | ICD-10-CM | POA: Diagnosis not present

## 2018-01-20 DIAGNOSIS — I351 Nonrheumatic aortic (valve) insufficiency: Secondary | ICD-10-CM | POA: Diagnosis not present

## 2018-01-21 ENCOUNTER — Telehealth: Payer: Self-pay | Admitting: Cardiology

## 2018-01-21 NOTE — Telephone Encounter (Signed)
Received records from [facility] on 01/21/18, Appt 01/30/18 @ 11:20AM. [place]

## 2018-01-29 NOTE — Progress Notes (Signed)
Cardiology Office Note   Date:  01/30/2018   ID:  [name] [name], [name] [name] Feb 04, 1951, MRN [id number]  PCP:  [name] Post, MD  Cardiologist:   [name] Candelas Martin[place], MD   Chief Complaint  Patient presents with  . Follow-up  . Atrial Fibrillation      History of Present Illness: [name] [name] is a 67 y.o. male who is seen for follow up Afib and CHF. He has a history of  atrial flutter and CAD. He is status [name] CABG in 2005. He had a normal stress echo in May of 2013. In August 2015 he presented with atrial flutter with RVR. He had mildly elevated troponins. He had successful TEE guided DCCV. He then underwent ablation of atrial flutter and ectopic atrial tachycardia on 03/30/14 by Dr. [name].  Stress Myoview September 2015 demonstrated excellent exercise tolerance and normal perfusion. TEE showed normal LV function, mild biatrial enlargement, mild MR,TR,AI. He does have moderate obstructive sleep apnea followed by Dr. Radford Pax and is on CPAP therapy.  He was seen in December with  an irregular and fast pulse. Was found to be in AFib with RVR. Echo showed EF 40-45% with moderate MR and AI and moderate biatrial enlargement. Toprol dose was increased and he was started on Eliquis. Underwent TEE guided DCCV on 07/17/17. Echo showed EF 40% with moderate biatrial enlargement. Moderate to severe MR, moderate TR, patent foramen ovale. He was successfully cardioverted but had recurrence of Afib on 07/24/17. He was seen in Afib clinic with Dr. [name] for consideration of Afib ablation versus AAD therapy. There was discussion about starting him on Tikosyn. Given new LV dysfunction and valvular disease it was felt that right and left heart cath were indicated prior to initiation of AAD therapy.  He also wore a monitor that showed episodes of wide complex tachycardia and it was unclear whether this was aberrancy versus VT. After our last visit he got a  second opinion at [facility] with Dr. Lurene [name]. He was admitted in  February and had a nuclear stress test showing no ischemia. EF 44%. He was loaded with Tikosyn and DCCV was performed. he had significant QT prolongation and Tikosyn was discontinued. He later underwent Cardiac MRI. He underwent AFib ablation with pulmonary vein isolation on 10/15/17. Echo following ablation as noted below.  Both MRI and Echo showed evidence of moderate to severe AI. LV function had returned to normal with restoration of NSR.  On follow up today he is feeling very well. Only noted one episode of Afib one night. Took extra metoprolol and it went away. Seen for EP follow up at [facility] last week. Ecg showed NSR with PACs. No dyspnea or chest pain.      Past Medical History:  Diagnosis Date  . Chicken pox   . Coronary artery disease    Severe three-vessel coronary artery disease with ejection fraction of 65%  . GERD (gastroesophageal reflux disease)   . History of gout   . History of psoriasis   . Hypercholesterolemia   . Hypertension   . OSA (obstructive sleep apnea)    Moderate with AHI of 16.9/hr now on CPAP at 10cm H2O  . Psoriatic arthritis [facility])     Past Surgical History:  Procedure Laterality Date  . ATRIAL FLUTTER ABLATION  03/30/2014  . ATRIAL FLUTTER ABLATION N/A 03/30/2014   Procedure: ATRIAL FLUTTER ABLATION;  Surgeon: Coralyn Mark, MD;  Location: [facility] CATH LAB;  Service: Cardiovascular;  Laterality: N/A;  .  CARDIAC CATHETERIZATION  09/2003   Ejection fraction was estimated at 65%.  . CARDIOVERSION N/A 03/15/2014   Procedure: CARDIOVERSION;  Surgeon: Lelon Perla, MD;  Location: [facility] ENDOSCOPY;  Service: Cardiovascular;  Laterality: N/A;  . CARDIOVERSION N/A 07/17/2017   Procedure: CARDIOVERSION;  Surgeon: Sueanne Margarita, MD;  Location: [place];  Service: Cardiovascular;  Laterality: N/A;  . CORONARY ARTERY BYPASS GRAFT  09/2003   [place]-lad,svg-diag,svg-om/distal LCX,svg-pda  . TEE WITHOUT CARDIOVERSION N/A 03/15/2014   Procedure: TRANSESOPHAGEAL  ECHOCARDIOGRAM (TEE);  Surgeon: Lelon Perla, MD;  Location: [facility] ENDOSCOPY;  Service: Cardiovascular;  Laterality: N/A;  . TEE WITHOUT CARDIOVERSION N/A 07/17/2017   Procedure: TRANSESOPHAGEAL ECHOCARDIOGRAM (TEE);  Surgeon: Sueanne Margarita, MD;  Location: [facility] ENDOSCOPY;  Service: Cardiovascular;  Laterality: N/A;  . TONSILLECTOMY  1950's     Current Outpatient Medications  Medication Sig Dispense Refill  . apixaban (ELIQUIS) 5 MG TABS tablet Take 1 tablet (5 mg total) by mouth 2 (two) times daily. 60 tablet 11  . atorvastatin (LIPITOR) 40 MG tablet TAKE 1 TABLET DAILY. 30 tablet 6  . cholecalciferol (VITAMIN D) 1000 UNITS tablet Take 1,000 Units by mouth daily.      . Coenzyme Q10 (CO Q 10 PO) Take 100 mg by mouth daily.     . colchicine 0.6 MG tablet TAKE 1 TABLET BY MOUTH TWICE DAILY. 90 tablet 1  . furosemide (LASIX) 20 MG tablet Take 20 mg by mouth daily.    Marland Kitchen losartan (COZAAR) 25 MG tablet Take 1 tablet (25 mg total) by mouth daily. 90 tablet 3  . metoprolol succinate (TOPROL-XL) 25 MG 24 hr tablet TAKE 1 TABLET BY MOUTH TWICE DAILY. 60 tablet 3  . multivitamin (THERAGRAN) per tablet Take 1 tablet by mouth daily.      . nitroGLYCERIN (NITROSTAT) 0.4 MG SL tablet PLACE 1 TABLET UNDER THE TONGUE EVERY 5 MINUTES AS NEEDED FOR CHEST PAIN. 25 tablet 5  . potassium chloride SA (K-DUR,KLOR-CON) 20 MEQ tablet Take 1 tablet (20 mEq total) by mouth daily. 30 tablet 6  . sildenafil (REVATIO) 20 MG tablet TAKE 2 TO 5 TABLETS ONE HOUR PRIOR TO SEXUAL ACTIVITY. 50 tablet 0  . tadalafil (CIALIS) 20 MG tablet Take 20 mg by mouth as directed.     No current facility-administered medications for this visit.     Allergies:   Patient has no known allergies.    Social History:  The patient  reports that he has quit smoking. His smoking use included cigarettes. He has a 2.50 pack-year smoking history. He has never used smokeless tobacco. He reports that he drinks about 2.4 oz of alcohol per week. He  reports that he does not use drugs.   Family History:  The patient's family history includes Cancer in his mother; Heart disease in his mother; Stroke in his mother.    ROS:  Please see the history of present illness.   Otherwise, review of systems are positive for none.   All other systems are reviewed and negative.    PHYSICAL EXAM: VS:  BP 116/63   Pulse 66   Ht 6\' 1"  (1.854 m)   Wt 193 lb (87.5 kg)   BMI 25.46 kg/m  , BMI Body mass index is 25.46 kg/m. GENERAL:  Well appearing WM in NAD HEENT:  PERRL, EOMI, sclera are clear. Oropharynx is clear. NECK:  No jugular venous distention, carotid upstroke brisk and symmetric, no bruits, no thyromegaly or adenopathy LUNGS:  Clear to auscultation  bilaterally CHEST:  Unremarkable HEART:  RRR with occ. Extrasystole,  PMI not displaced or sustained,S1 and S2 within normal limits, no S3, no S4: no clicks, no rubs, soft 1/6 diastolic murmur. ABD:  Soft, nontender. BS +, no masses or bruits. No hepatomegaly, no splenomegaly EXT:  2 + pulses throughout, no edema, no cyanosis no clubbing SKIN:  Warm and dry.  No rashes NEURO:  Alert and oriented x 3. Cranial nerves II through XII intact. PSYCH:  Cognitively intact   Recent Labs: 07/04/2017: ALT 36; BNP 421.5; TSH 0.956 08/13/2017: BUN 13; Creatinine, Ser 1.01; Hemoglobin 15.7; Platelets 163; Potassium 4.9; Sodium 140  Dated 05/08/17: normal CMET and CBC  Lipid Panel    Component Value Date/Time   CHOL 153 02/14/2017 0000   TRIG 65 02/14/2017 0000   HDL 71 02/14/2017 0000   CHOLHDL 2.2 02/14/2017 0000   CHOLHDL 3 11/23/2015 0837   VLDL 16.4 11/23/2015 0837   LDLCALC 69 02/14/2017 0000      Wt Readings from Last 3 Encounters:  01/30/18 193 lb (87.5 kg)  10/22/17 198 lb 9.6 oz (90.1 kg)  10/18/17 199 lb 3.2 oz (90.4 kg)      Other studies Reviewed: Additional studies/ records that were reviewed today include: none.  ETT 03/06/17: Study Highlights     Blood pressure  demonstrated a hypertensive response to exercise.  Upsloping ST segment depression ST segment depression of 1 mm was noted during stress in the II, III, aVF, V4 and V5 leads, and returning to baseline after less than 1 minute of recovery.   1. Good exercise tolerance.  2. Upsloping ST depression in inferior leads and V4/V5.  This is nonspecific.   No evidence for ischemia.    Echo 07/08/17: Study Conclusions  - Left ventricle: The cavity size was normal. Wall thickness was   increased in a pattern of moderate LVH. Systolic function was   mildly to moderately reduced. The estimated ejection fraction was   in the range of 40% to 45%. - Aortic valve: There was moderate regurgitation. - Mitral valve: There was moderate regurgitation. - Left atrium: The atrium was moderately dilated. - Right atrium: The atrium was moderately dilated.  TEE 07/17/17: Study Conclusions  - Left ventricle: The estimated ejection fraction was in the range   of 40% to 45%. Mild diffuse hypokinesis with no identifiable   regional variations. No evidence of thrombus. - Aortic valve: There was moderate perivalvular regurgitation. - Mitral valve: No evidence of vegetation. There was moderate to   severe regurgitation, with multiple jets. Diastolic regurgitation   was absent. - Left atrium: The atrium was moderately dilated. No evidence of   thrombus in the atrial cavity or appendage. No evidence of   thrombus in the atrial cavity or appendage. Emptying velocity was   reduced. - Right atrium: The atrium was moderately dilated. - Atrial septum: There was a medium-sized fenestrated patent   foramen ovale. A in the fossa ovalis region was present. There   was a moderate bidirectional shunt through a patent foramen   ovale, in the baseline state. - Tricuspid valve: No evidence of vegetation. There was moderate   regurgitation. - Pulmonic valve: No evidence of vegetation.  Impressions:  - Successful  cardioversion. No cardiac source of emboli was   indentified.  Event monitor 07/25/17: Study Highlights   Atrial fibrillation Ventricular rates are frequently elevated Frequent nonsustained ventricular tachycardia as well as aberrantly conducted afib are note No sustained ventricular arrhythmias No  prolonged pauses or AV block   Cardiac MRI 10/14/17: Cardiac MRI and thoracic MRA with and without contrast was performed on a 1.5 T MRI scanner to evaluate myocardial morphology, function, viability and assess pulmonary vein anatomy in a patient with hx of GERD, HTN, hypercholesterolemia, OSA on CPAP, CAD s/p CABG (LIMA to LAD, svg to D1, sequential svg to OM and distal LCx, and svg to PDA), atrial flutter s/p ablation, atrial fibrillation, and wide complex tachycardia thought to due to SVT with aberrancy.Transthoracic echo demonstrated an LVEF of 40-45%, normal cavity size, moderate MR, and moderate AR.Transesophageal echo demonstrated moderate aortic regurgitation, moderate-severe mitral regurgitation, and a PFO.Nuclear stress demonstrated a mildly dilated LV with no perfusion defect and an EF of 44%.The patient is scheduled for ablation for atrial fibrillation.  Cardiac MRI 1. The left ventricle is moderately dilated.Wall thickness is normal.Regional and global LV systolic function are low normal.The LVEF is calculated at 59%.  2. The right ventricle is normal in cavity size and wall thickness.Global RV systolic function is low normal to mildly reduced.The RVEF is calculated at 50%.  3. Both atria are severely enlarged.  4. The aortic valve is trileaflet in morphology. There is no significant aortic stenosis. There is moderate-severe aortic regurgitation.The regurgitant orifice area measures 0.26 cm^2.There is reversal of flow in the proximal descending thoracic aorta.  5.There ismild-moderate mitral regurgitation.There is moderate tricuspid  regurgitation.There is mild pulmonic regurgitation.  6. Delayed enhancement imaging demonstrates no evidence of myocardial infarction, scarring,or infiltration.  7.There is no evidence of an intracardiac thrombus.  8.The pericardium is normal in thickness.There is no significant pericardial effusion.  Thoracic MRA 1. There are 3 right sided pulmonary veins.The right lower and right middle pulmonary veins enter the LA immediately adjacent to one another. The right middle vein is small.  The 2 left sided pulmonary veins enter the LA normally.  All veins are patent without significant stenosis proximally.   MRA bi-orthogonal luminal dimensions are listed below: RUPV: 2.0 x 1.8 cm, 76 cm/sec RMPV: 0.6 x 0.5 cm RLPV: 2.0 x 1.7 cm, 60 cm/ sec LUPV: 1.9 x 1.0 cm, 59 cm/ sec LLPV: 1.9 x 0.9 cm, 60 cm/ sec  MRA LA dimensions Head-foot: 7.1 cm Right-left:6.2 cm Anterior-posterior:4.3 cm  2. The thoracic aorta is normal in diameter. There is no evidence of a dissection flap.  3. The main and proximal branch pulmonary arteries are normal in size  4. Systemic venous connections are normal.  5. The esophagus is posterior of the left atrium in close proximity to the ostia of the left-sided pulmonary veins.The descending thoracic aorta is behind the esophagus and behind the left sided pulmonary Veins.  Echo 10/16/17: INTERPRETATION --------------------------------------------------------------- NORMAL LEFT VENTRICULAR SYSTOLIC FUNCTION WITH MILD LVH NORMAL LA PRESSURES WITH DIASTOLIC DYSFUNCTION NORMAL RIGHT VENTRICULAR SYSTOLIC FUNCTION VALVULAR REGURGITATION: MODERATE AR, MILD MR, TRIVIAL PR, MILD TR NO VALVULAR STENOSIS IRREGULAR RHYTHM Moderate to severe aortic regurgitation. Mild left ventricular enlargement suggests color Doppler may underestimate aortic regurgitation severity. AR VENA CONTRACTA=0.5CM MILD TO MODERATE TR NO PRIOR STUDY  FOR COMPARISON   ASSESSMENT AND PLAN:  1. Atrial fibriillation with RVR. Initially diagnosed in December.  Prior history of atrial flutter and atrial tachycardia ablation in 2015. On anticoagulation with Eliquis 5 mg bid. [place] vasc score of 3. On higher Toprol XL dose. S/p TEE guided DCCV but remained in normal rhythm for only about a week. Failed Tikosyn due to QT prolongation. Now s/p Afib ablation at [facility] on 10/15/17.  Recommend long term anticoagulation. Afib has been well controlled since ablation. He does have PACs that are asymptomatic.   2. Coronary disease status [name] CABG in 2005. He is without chest pain. Marland Kitchen He had a normal Myoview study September 2015. Low risk ETT in August 2018. Repeat nuclear  study at [facility] showed no ischemia. EF 44%. We will continue risk factor modification.   3. Chronic Aortic insufficiency. By Echo and MRI this is in the moderate to severe range. EF had returned to normal with restoration of NSR. No stigmata of chronic AI. Pulse pressure normal. Murmur is not impressive. Recommend repeat Echo in October to follow.   4. 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   5. Atrial flutter s/p ablation.   6. OSA- now on CPAP. Followed by Dr. Radford Pax.  7. Hyperlipidemia on statin.  8. HTN controlled.     Disposition:   FU with me in 6 months  Signed, Rodnisha Blomgren Martin[place], MD  01/30/2018 12:16 PM    [place]

## 2018-01-30 ENCOUNTER — Ambulatory Visit: Payer: PPO | Admitting: Cardiology

## 2018-01-30 ENCOUNTER — Encounter: Payer: Self-pay | Admitting: Cardiology

## 2018-01-30 VITALS — BP 116/63 | HR 66 | Ht 73.0 in | Wt 193.0 lb

## 2018-01-30 DIAGNOSIS — I481 Persistent atrial fibrillation: Secondary | ICD-10-CM

## 2018-01-30 DIAGNOSIS — I10 Essential (primary) hypertension: Secondary | ICD-10-CM | POA: Diagnosis not present

## 2018-01-30 DIAGNOSIS — Z951 Presence of aortocoronary bypass graft: Secondary | ICD-10-CM

## 2018-01-30 DIAGNOSIS — I2510 Atherosclerotic heart disease of native coronary artery without angina pectoris: Secondary | ICD-10-CM | POA: Diagnosis not present

## 2018-01-30 DIAGNOSIS — I4819 Other persistent atrial fibrillation: Secondary | ICD-10-CM

## 2018-01-30 DIAGNOSIS — I351 Nonrheumatic aortic (valve) insufficiency: Secondary | ICD-10-CM | POA: Diagnosis not present

## 2018-01-30 MED ORDER — LOSARTAN POTASSIUM 50 MG PO TABS: 25.0000 mg | ORAL_TABLET | Freq: Every day | ORAL | 3 refills | Status: DC

## 2018-01-30 MED ORDER — LOSARTAN POTASSIUM 25 MG PO TABS: 25.0000 mg | ORAL_TABLET | Freq: Every day | ORAL | 3 refills | Status: DC

## 2018-01-30 NOTE — Patient Instructions (Addendum)
Reduce losartan to 25 mg daily  We will arrange a follow up Echo in October Your physician has requested that you have an echocardiogram. Echocardiography is a painless test that uses sound waves to create images of your heart. It provides your doctor with information about the size and shape of your heart and how well your heart's chambers and valves are working. This procedure takes approximately one hour. There are no restrictions for this procedure. [address]   Continue your other therapy  I will see you in 6 months.

## 2018-03-10 ENCOUNTER — Other Ambulatory Visit: Payer: Self-pay | Admitting: Cardiology

## 2018-03-10 NOTE — Telephone Encounter (Signed)
Rx sent to pharmacy   

## 2018-03-13 DIAGNOSIS — I481 Persistent atrial fibrillation: Secondary | ICD-10-CM | POA: Diagnosis not present

## 2018-03-13 DIAGNOSIS — I351 Nonrheumatic aortic (valve) insufficiency: Secondary | ICD-10-CM | POA: Diagnosis not present

## 2018-03-13 DIAGNOSIS — Z951 Presence of aortocoronary bypass graft: Secondary | ICD-10-CM | POA: Diagnosis not present

## 2018-03-13 DIAGNOSIS — E7800 Pure hypercholesterolemia, unspecified: Secondary | ICD-10-CM | POA: Diagnosis not present

## 2018-04-07 NOTE — Progress Notes (Addendum)
Subjective:   [name] [name] is a 67 y.o. male who presents for Medicare Annual/Subsequent preventive examination.  Reports health as S/p cabg in 2005 - followed by [name] Seen Dr. 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 Hepatitis C Screening  1951/03/04   Colonoscopy 10/2011 per the patient; - due 2023 Due 10/2021  PSV 23 due  Flu vaccine - will do this when he comes back to see Dr. Elease Hashimoto Educated to take prior to flu season  Educate on Hep C - will obtain at his next blood draw  Educate regarding shingrix   Cardiac Risk Factors include: advanced age (>38men, >62 women);dyslipidemia;family history of premature cardiovascular disease;hypertension     Objective:    Vitals: Pulse 85   Ht 6' 0.5" (1.842 m)   Wt 199 lb (90.3 kg)   SpO2 96%   BMI 26.62 kg/m   Body mass index is 26.62 kg/m.  Advanced Directives 04/08/2018 07/17/2017 03/14/2017 03/30/2014 03/14/2014  Does Patient Have a Medical Advance Directive? Yes No Yes Yes Yes  Type of Advance Directive - - - - Press photographer;Living will  Does patient want to make changes to medical advance directive? - - - No - Patient declined No - Patient declined  Copy of [place] in Chart? - - - No - copy requested No - copy requested  Would patient like information on creating a medical advance directive? - No - Patient declined - - -    Tobacco Social History   Tobacco Use  Smoking Status  Former Smoker  . Packs/day: 0.50  . Years: 5.00  . Pack years: 2.50  . Types: Cigarettes  Smokeless Tobacco Never Used  Tobacco Comment   "quit smoking cigarettes in the late 1970's"     Counseling given: Not Answered Comment: "quit smoking cigarettes in the late 1970's" goes to [facility] and states he has had a CT scan there, so most likely would have seen AAA  Will discuss with Dr. Elease Hashimoto  Clinical Intake:   Past Medical History:  Diagnosis Date  . Chicken pox   . Coronary artery disease    Severe three-vessel coronary artery disease with ejection fraction of 65%  . GERD (gastroesophageal reflux disease)   . History of gout   . History of psoriasis   . Hypercholesterolemia   . Hypertension   . OSA (obstructive sleep apnea)    Moderate with AHI of 16.9/hr now on CPAP at 10cm H2O  . Psoriatic arthritis [facility])    Past Surgical History:  Procedure Laterality Date  . ATRIAL FLUTTER ABLATION  03/30/2014  . ATRIAL FLUTTER ABLATION N/A 03/30/2014   Procedure: ATRIAL FLUTTER ABLATION;  Surgeon: Coralyn Mark, MD;  Location: [place] CATH LAB;  Service: Cardiovascular;  Laterality: N/A;  . CARDIAC CATHETERIZATION  09/2003   Ejection fraction was estimated at 65%.  . CARDIOVERSION N/A 03/15/2014   Procedure: CARDIOVERSION;  Surgeon: Lelon Perla, MD;  Location: [facility] ENDOSCOPY;  Service: Cardiovascular;  Laterality: N/A;  . CARDIOVERSION N/A 07/17/2017   Procedure: CARDIOVERSION;  Surgeon: Sueanne Margarita, MD;  Location: [place];  Service: Cardiovascular;  Laterality: N/A;  . CORONARY ARTERY BYPASS GRAFT  09/2003   [place]-lad,svg-diag,svg-om/distal LCX,svg-pda  . TEE WITHOUT CARDIOVERSION N/A 03/15/2014   Procedure: TRANSESOPHAGEAL ECHOCARDIOGRAM (TEE);  Surgeon: Lelon Perla, MD;  Location[facility] ENDOSCOPY;  Service: Cardiovascular;  Laterality: N/A;  . TEE WITHOUT CARDIOVERSION N/A 07/17/2017   Procedure: TRANSESOPHAGEAL ECHOCARDIOGRAM (TEE);  Surgeon: Sueanne Margarita, MD;  Location: [facility]  ENDOSCOPY;  Service: Cardiovascular;  Laterality: N/A;  . TONSILLECTOMY  1950's   Family History  Problem Relation Age of Onset  . Heart disease Mother        cabg  . Cancer Mother        breast  . Stroke Mother    Social History   Socioeconomic History  . Marital status: Married    Spouse name: Not on file  . Number of children: 1  . Years of education: Not on file  . Highest education level: Not on file  Occupational History  . Occupation: Print production planner: [employer]  . Financial resource strain: Not on file  . Food insecurity:    Worry: Not on file    Inability: Not on file  . Transportation needs:    Medical: Not on file    Non-medical: Not on file  Tobacco Use  . Smoking status: Former Smoker    Packs/day: 0.50    Years: 5.00    Pack years: 2.50    Types: Cigarettes  . Smokeless tobacco: Never Used  . Tobacco comment: "quit smoking cigarettes in the late 1970's"  Substance and Sexual Activity  . Alcohol use: Yes    Alcohol/week: 4.0 standard drinks    Types: 2 Cans of beer, 2 Shots of liquor per week    Comment: 2 to 3 drinks a wake   . Drug use: No  . Sexual activity: Yes  Lifestyle  . Physical activity:    Days per week: Not on file    Minutes per session: Not on file  . Stress: Not on file  Relationships  . Social connections:    Talks on phone: Not on file    Gets together: Not on file    Attends religious service: Not on file    Active member of club or organization: Not on file    Attends meetings of clubs or organizations: Not on file    Relationship status: Not on file  Other Topics Concern  . Not on file  Social History Narrative  . Not on file    Outpatient Encounter Medications as of 04/08/2018  Medication Sig  . apixaban (ELIQUIS) 5 MG TABS tablet Take 1 tablet (5 mg total) by mouth 2 (two) times daily.  Marland Kitchen atorvastatin (LIPITOR) 40 MG tablet TAKE 1 TABLET BY MOUTH DAILY.  . cholecalciferol  (VITAMIN D) 1000 UNITS tablet Take 1,000 Units by mouth daily.    . Coenzyme Q10 (CO Q 10 PO) Take 100 mg by mouth daily.   . colchicine 0.6 MG tablet TAKE 1 TABLET BY MOUTH TWICE DAILY.  . furosemide (LASIX) 20 MG tablet Take 20 mg by mouth daily.  Marland Kitchen losartan (COZAAR) 25 MG tablet Take 1 tablet (25 mg total) by mouth daily.  . metoprolol succinate (TOPROL-XL) 25 MG 24 hr  tablet TAKE 1 TABLET BY MOUTH TWICE DAILY.  . multivitamin (THERAGRAN) per tablet Take 1 tablet by mouth daily.    . nitroGLYCERIN (NITROSTAT) 0.4 MG SL tablet PLACE 1 TABLET UNDER THE TONGUE EVERY 5 MINUTES AS NEEDED FOR CHEST PAIN.  Marland Kitchen potassium chloride SA (K-DUR,KLOR-CON) 20 MEQ tablet Take 1 tablet (20 mEq total) by mouth daily.  . sildenafil (REVATIO) 20 MG tablet TAKE 2 TO 5 TABLETS ONE HOUR PRIOR TO SEXUAL ACTIVITY.  . tadalafil (CIALIS) 20 MG tablet Take 20 mg by mouth as directed.   No facility-administered encounter medications on file as of 04/08/2018.     Activities of Daily Living In your present state of health, do you have any difficulty performing the following activities: 04/08/2018  Hearing? N  Vision? N  Difficulty concentrating or making decisions? N  Walking or climbing stairs? N  Dressing or bathing? N  Doing errands, shopping? N  Preparing Food and eating ? N  Using the Toilet? N  In the past six months, have you accidently leaked urine? N  Do you have problems with loss of bowel control? N  Managing your Medications? N  Managing your Finances? N  Housekeeping or managing your Housekeeping? N  Some recent data might be hidden    Patient Care Team: [name], MD as PCP - General (Family Medicine)   Assessment:   This is a routine wellness examination for Gar[place].  Exercise Activities and Dietary recommendations Current Exercise Habits: Structured exercise class, Time (Minutes): 60, Frequency (Times/Week): 3, Weekly Exercise (Minutes/Week): 180  Goals    . Patient Stated     Try  to maintain Try to lose a few more pounds Keep exercising and cut back on portions    . Weight (lb) < 200 lb (90.7 kg)     Plans to change eating habits Slow down on eating out at lunch  One larger and one smaller a day  Can use http://vang.com/       Fall Risk Fall Risk  04/08/2018 03/14/2017 10/12/2014  Falls in the past year? No Yes No  Number falls in past yr: - 1 -  Comment - fell going hiking  -  Follow up - Education provided -     Depression Screen PHQ 2/9 Scores 04/08/2018 03/14/2017 10/12/2014  PHQ - 2 Score 0 0 0    Cognitive Function MMSE - Mini Mental State Exam 04/08/2018 03/14/2017  Not completed: (No Data) (No Data)     Ad8 score reviewed for issues:  Issues making decisions:  Less interest in hobbies / activities:  Repeats questions, stories (family complaining):  Trouble using ordinary gadgets (microwave, computer, phone):  Forgets the month or year:   Mismanaging finances:   Remembering appts:  Daily problems with thinking and/or memory: Ad8 score is=0         Immunization History  Administered Date(s) Administered  . Influenza, High Dose Seasonal PF 05/06/2017  . Influenza,inj,Quad PF,6+ Mos 08/20/2013, 03/31/2014  . Pneumococcal Conjugate-13 08/20/2013  . Td 07/23/2008     Screening Tests Health Maintenance  Topic Date Due  . Hepatitis C Screening  08-06-1950  . INFLUENZA VACCINE  11/26/2018 (Originally 02/20/2018)  . PNA vac Low Risk Adult (2 of 2 - PPSV23) 04/09/2019 (Originally 08/20/2014)  . TETANUS/TDAP  07/23/2018  . COLONOSCOPY  11/06/2021         Plan:      PCP Notes   Health Maintenance Declined flu and PSV 23 today Will take  later   Colonoscopy 10/2011 per the patient; - due 2023 Due 10/2021  PSV 23 due  Flu vaccine - will do this when he comes back to see Dr. Elease Hashimoto Educated to take prior to flu season  Educate on Hep C - will obtain at his next blood draw  Educate regarding shingrix   Remote  smoking hx goes to [facility] and states he has had a CT scan there, so most likely would have seen AAA  Will discuss with Dr. Elease Hashimoto   Abnormal Screens  none  Referrals  none  Patient concerns; Requesting tadalafil generic instead of brand name. Please advise (placed in basket note)   Height 35ft 1/2in and was 17ft 1; in a class discussing bone health  Educated regarding osteoporosis and given the osteoporosis web site.  Discussed back health   HOPE program   Nurse Concerns; As noted  Next PCP apt [facility] later this year       I have personally reviewed and noted the following in the patient's chart:   . Medical and social history . Use of alcohol, tobacco or illicit drugs  . Current medications and supplements . Functional ability and status . Nutritional status . Physical activity . Advanced directives . List of other physicians . Hospitalizations, surgeries, and ER visits in previous 12 months . Vitals . Screenings to include cognitive, depression, and falls . Referrals and appointments  In addition, I have reviewed and discussed with patient certain preventive protocols, quality metrics, and best practice recommendations. A written personalized care plan for preventive services as well as general preventive health recommendations were provided to patient.     TIWPY,KDXIP, RN  04/08/2018  I have reviewed the documentation for the AWV and [place] provided by the health coach and agree with their documentation. I was immediately available for any questions  [name] MD [facility]

## 2018-04-08 ENCOUNTER — Telehealth: Payer: Self-pay

## 2018-04-08 ENCOUNTER — Ambulatory Visit (INDEPENDENT_AMBULATORY_CARE_PROVIDER_SITE_OTHER): Payer: PPO

## 2018-04-08 VITALS — BP 106/60 | HR 85 | Ht 72.5 in | Wt 199.0 lb

## 2018-04-08 DIAGNOSIS — Z0000 Encounter for general adult medical examination without abnormal findings: Secondary | ICD-10-CM | POA: Diagnosis not present

## 2018-04-08 DIAGNOSIS — Z1159 Encounter for screening for other viral diseases: Secondary | ICD-10-CM

## 2018-04-08 NOTE — Patient Instructions (Addendum)
Mr. Norcia , Thank you for taking time to come for your Medicare Wellness Visit. I appreciate your ongoing commitment to your health goals. Please review the following plan we discussed and let me know if I can assist you in the future.   Men Ages 45 to 91 Years who Have Ever Smoked  The [employer]PSTF recommends one-time screening for abdominal aortic aneurysm (AAA) with ultrasonography in men ages 53 to 11 years who have ever smoked.   A Tetanus is recommended every 10 years. Medicare covers a tetanus if you have a cut or wound; otherwise, there may be a charge. If you had not had a tetanus with pertusses, known as the Tdap, you can take this anytime.   Shingrix is a vaccine for the prevention of Shingles in Adults 50 and older.  If you are on Medicare, the shingrix is covered under your Part D plan, so you will take both of the vaccines in the series at your pharmacy. Please check with your benefits regarding applicable copays or out of pocket expenses.  The Shingrix is given in 2 vaccines approx 8 weeks apart. You must receive the 2nd dose prior to 6 months from receipt of the first. Please have the pharmacist print out you Immunization  dates for our office records   Recommendations for Dexa Scan Male over the age of 18 Man age 17 or older If you broke a bone past the age of 11 Women menopausal age with risk factors (thin frame; smoker; hx of fx ) Post menopausal women under the age of 41 with risk factors A man age 80 to 106 with risk factors Other: Spine xray that is showing break of bone loss Back pain with possible break Height loss of 1/2 inch or more within one year Total loss in height of 1.5 inches from your original height  Calcium 1200mg  with Vit D 800u per day; more as directed by physician Strength building exercises discussed; can include walking; housework; small weights or stretch bands; silver sneakers if access to the [facility]  Please visit the osteoporosis foundation.org for  up to date recommendations      These are the goals we discussed: Goals    . Weight (lb) < 200 lb (90.7 kg)     Plans to change eating habits Slow down on eating out at lunch  One larger and one smaller a day  Can use http://vang.com/       This is a list of the screening recommended for you and due dates:  Health Maintenance  Topic Date Due  .  Hepatitis C: One time screening is recommended by Center for Disease Control  (CDC) for  adults born from [birth year] through 1967.   1950-11-11  . Pneumonia vaccines (2 of 2 - PPSV23) 08/20/2014  . Flu Shot  02/20/2018  . Tetanus Vaccine  07/23/2018  . Colon Cancer Screening  11/06/2021      Fall Prevention in the Home Falls can cause injuries. They can happen to people of all ages. There are many things you can do to make your home safe and to help prevent falls. What can I do on the outside of my home?  Regularly fix the edges of walkways and driveways and fix any cracks.  Remove anything that might make you trip as you walk through a door, such as a raised step or threshold.  Trim any bushes or trees on the path to your home.  Use bright outdoor lighting.  Clear  any walking paths of anything that might make someone trip, such as rocks or tools.  Regularly check to see if handrails are loose or broken. Make sure that both sides of any steps have handrails.  Any raised decks and porches should have guardrails on the edges.  Have any leaves, snow, or ice cleared regularly.  Use sand or salt on walking paths during winter.  Clean up any spills in your garage right away. This includes oil or grease spills. What can I do in the bathroom?  Use night lights.  Install grab bars by the toilet and in the tub and shower. Do not use towel bars as grab bars.  Use non-skid mats or decals in the tub or shower.  If you need to sit down in the shower, use a plastic, non-slip stool.  Keep the floor dry. Clean up any water that spills  on the floor as soon as it happens.  Remove soap buildup in the tub or shower regularly.  Attach bath mats securely with double-sided non-slip rug tape.  Do not have throw rugs and other things on the floor that can make you trip. What can I do in the bedroom?  Use night lights.  Make sure that you have a light by your bed that is easy to reach.  Do not use any sheets or blankets that are too big for your bed. They should not hang down onto the floor.  Have a firm chair that has side arms. You can use this for support while you get dressed.  Do not have throw rugs and other things on the floor that can make you trip. What can I do in the kitchen?  Clean up any spills right away.  Avoid walking on wet floors.  Keep items that you use a lot in easy-to-reach places.  If you need to reach something above you, use a strong step stool that has a grab bar.  Keep electrical cords out of the way.  Do not use floor polish or wax that makes floors slippery. If you must use wax, use non-skid floor wax.  Do not have throw rugs and other things on the floor that can make you trip. What can I do with my stairs?  Do not leave any items on the stairs.  Make sure that there are handrails on both sides of the stairs and use them. Fix handrails that are broken or loose. Make sure that handrails are as long as the stairways.  Check any carpeting to make sure that it is firmly attached to the stairs. Fix any carpet that is loose or worn.  Avoid having throw rugs at the top or bottom of the stairs. If you do have throw rugs, attach them to the floor with carpet tape.  Make sure that you have a light switch at the top of the stairs and the bottom of the stairs. If you do not have them, ask someone to add them for you. What else can I do to help prevent falls?  Wear shoes that: ? Do not have high heels. ? Have rubber bottoms. ? Are comfortable and fit you well. ? Are closed at the toe. Do  not wear sandals.  If you use a stepladder: ? Make sure that it is fully opened. Do not climb a closed stepladder. ? Make sure that both sides of the stepladder are locked into place. ? Ask someone to hold it for you, if possible.  Clearly mark  and make sure that you can see: ? Any grab bars or handrails. ? First and last steps. ? Where the edge of each step is.  Use tools that help you move around (mobility aids) if they are needed. These include: ? Canes. ? Walkers. ? Scooters. ? Crutches.  Turn on the lights when you go into a dark area. Replace any light bulbs as soon as they burn out.  Set up your furniture so you have a clear path. Avoid moving your furniture around.  If any of your floors are uneven, fix them.  If there are any pets around you, be aware of where they are.  Review your medicines with your doctor. Some medicines can make you feel dizzy. This can increase your chance of falling. Ask your doctor what other things that you can do to help prevent falls. This information is not intended to replace advice given to you by your health care provider. Make sure you discuss any questions you have with your health care provider. Document Released: 05/05/2009 Document Revised: 12/15/2015 Document Reviewed: 08/13/2014 Elsevier Interactive Patient Education  2018 [place] Maintenance, Male A healthy lifestyle and preventive care is important for your health and wellness. Ask your health care provider about what schedule of regular examinations is right for you. What should I know about weight and diet? Eat a Healthy Diet  Eat plenty of vegetables, fruits, whole grains, low-fat dairy products, and lean protein.  Do not eat a lot of foods high in solid fats, added sugars, or salt.  Maintain a Healthy Weight Regular exercise can help you achieve or maintain a healthy weight. You should:  Do at least 150 minutes of exercise each week. The exercise should  increase your heart rate and make you sweat (moderate-intensity exercise).  Do strength-training exercises at least twice a week.  Watch Your Levels of Cholesterol and Blood Lipids  Have your blood tested for lipids and cholesterol every 5 years starting at 69 years of age. If you are at high risk for heart disease, you should start having your blood tested when you are 69 years old. You may need to have your cholesterol levels checked more often if: ? Your lipid or cholesterol levels are high. ? You are older than 69 years of age. ? You are at high risk for heart disease.  What should I know about cancer screening? Many types of cancers can be detected early and may often be prevented. Lung Cancer  You should be screened every year for lung cancer if: ? You are a current smoker who has smoked for at least 30 years. ? You are a former smoker who has quit within the past 15 years.  Talk to your health care provider about your screening options, when you should start screening, and how often you should be screened.  Colorectal Cancer  Routine colorectal cancer screening usually begins at 69 years of age and should be repeated every 5-10 years until you are 69 years old. You may need to be screened more often if early forms of precancerous polyps or small growths are found. Your health care provider may recommend screening at an earlier age if you have risk factors for colon cancer.  Your health care provider may recommend using home test kits to check for hidden blood in the stool.  A small camera at the end of a tube can be used to examine your colon (sigmoidoscopy or colonoscopy). This checks for the  earliest forms of colorectal cancer.  Prostate and Testicular Cancer  Depending on your age and overall health, your health care provider may do certain tests to screen for prostate and testicular cancer.  Talk to your health care provider about any symptoms or concerns you have about  testicular or prostate cancer.  Skin Cancer  Check your skin from head to toe regularly.  Tell your health care provider about any new moles or changes in moles, especially if: ? There is a change in a mole's size, shape, or color. ? You have a mole that is larger than a pencil eraser.  Always use sunscreen. Apply sunscreen liberally and repeat throughout the day.  Protect yourself by wearing long sleeves, pants, a wide-brimmed hat, and sunglasses when outside.  What should I know about heart disease, diabetes, and high blood pressure?  If you are 38-34 years of age, have your blood pressure checked every 3-5 years. If you are 41 years of age or older, have your blood pressure checked every year. You should have your blood pressure measured twice-once when you are at a hospital or clinic, and once when you are not at a hospital or clinic. Record the average of the two measurements. To check your blood pressure when you are not at a hospital or clinic, you can use: ? An automated blood pressure machine at a pharmacy. ? A home blood pressure monitor.  Talk to your health care provider about your target blood pressure.  If you are between 79-32 years old, ask your health care provider if you should take aspirin to prevent heart disease.  Have regular diabetes screenings by checking your fasting blood sugar level. ? If you are at a normal weight and have a low risk for diabetes, have this test once every three years after the age of 65. ? If you are overweight and have a high risk for diabetes, consider being tested at a younger age or more often.  A one-time screening for abdominal aortic aneurysm (AAA) by ultrasound is recommended for men aged 2-75 years who are current or former smokers. What should I know about preventing infection? Hepatitis B If you have a higher risk for hepatitis B, you should be screened for this virus. Talk with your health care provider to find out if you are  at risk for hepatitis B infection. Hepatitis C Blood testing is recommended for:  Everyone born from [birth year] through 1967.  Anyone with known risk factors for hepatitis C.  Sexually Transmitted Diseases (STDs)  You should be screened each year for STDs including gonorrhea and chlamydia if: ? You are sexually active and are younger than 69 years of age. ? You are older than 69 years of age and your health care provider tells you that you are at risk for this type of infection. ? Your sexual activity has changed since you were last screened and you are at an increased risk for chlamydia or gonorrhea. Ask your health care provider if you are at risk.  Talk with your health care provider about whether you are at high risk of being infected with HIV. Your health care provider may recommend a prescription medicine to help prevent HIV infection.  What else can I do?  Schedule regular health, dental, and eye exams.  Stay current with your vaccines (immunizations).  Do not use any tobacco products, such as cigarettes, chewing tobacco, and e-cigarettes. If you need help quitting, ask your health care provider.  Limit alcohol  intake to no more than 2 drinks per day. One drink equals 12 ounces of beer, 5 ounces of wine, or 1 ounces of hard liquor.  Do not use street drugs.  Do not share needles.  Ask your health care provider for help if you need support or information about quitting drugs.  Tell your health care provider if you often feel depressed.  Tell your health care provider if you have ever been abused or do not feel safe at home. This information is not intended to replace advice given to you by your health care provider. Make sure you discuss any questions you have with your health care provider. Document Released: 01/05/2008 Document Revised: 03/07/2016 Document Reviewed: 04/12/2015 Elsevier Interactive Patient Education  [employer].

## 2018-04-08 NOTE — Telephone Encounter (Signed)
In for AWV 9/17  Requesting tadalafil generic instead of brand name. Please advise as it looks like it was generic. States price is prohibitive Can't take sildenafil   Please advise

## 2018-04-08 NOTE — Telephone Encounter (Signed)
We can send in generic Tadalafil 20 mg po every other day as needed for erectile dysfunction #6 with 11 refills ( but sometimes generic are also very costly)

## 2018-04-10 ENCOUNTER — Other Ambulatory Visit: Payer: Self-pay

## 2018-04-10 MED ORDER — TADALAFIL 20 MG PO TABS: 20.0000 mg | ORAL_TABLET | ORAL | 11 refills | Status: DC

## 2018-04-10 NOTE — Progress Notes (Unsigned)
   The patient requested tadalafil generic   Per Dr. Elease Hashimoto;  We can send in generic Tadalafil 20 mg po every other day as needed for erectile dysfunction #6 with 11 refills ( but sometimes generic are also very costly)  Order placed at pharamcy. The patient was called and voice mail left on self identified VM  Wynetta Fines RN

## 2018-04-12 ENCOUNTER — Ambulatory Visit (INDEPENDENT_AMBULATORY_CARE_PROVIDER_SITE_OTHER): Payer: PPO

## 2018-04-12 ENCOUNTER — Ambulatory Visit (HOSPITAL_COMMUNITY)
Admission: EM | Admit: 2018-04-12 | Discharge: 2018-04-12 | Disposition: A | Discharge status: Home / Self Care | Payer: PPO | Attending: Internal Medicine | Admitting: Internal Medicine

## 2018-04-12 ENCOUNTER — Encounter (HOSPITAL_COMMUNITY): Payer: Self-pay | Admitting: Emergency Medicine

## 2018-04-12 DIAGNOSIS — I4892 Unspecified atrial flutter: Secondary | ICD-10-CM | POA: Insufficient documentation

## 2018-04-12 DIAGNOSIS — Z7901 Long term (current) use of anticoagulants: Secondary | ICD-10-CM | POA: Insufficient documentation

## 2018-04-12 DIAGNOSIS — J029 Acute pharyngitis, unspecified: Secondary | ICD-10-CM | POA: Diagnosis not present

## 2018-04-12 DIAGNOSIS — L4050 Arthropathic psoriasis, unspecified: Secondary | ICD-10-CM | POA: Diagnosis not present

## 2018-04-12 DIAGNOSIS — I2510 Atherosclerotic heart disease of native coronary artery without angina pectoris: Secondary | ICD-10-CM | POA: Diagnosis not present

## 2018-04-12 DIAGNOSIS — I481 Persistent atrial fibrillation: Secondary | ICD-10-CM | POA: Insufficient documentation

## 2018-04-12 DIAGNOSIS — M109 Gout, unspecified: Secondary | ICD-10-CM | POA: Insufficient documentation

## 2018-04-12 DIAGNOSIS — Z79899 Other long term (current) drug therapy: Secondary | ICD-10-CM | POA: Insufficient documentation

## 2018-04-12 DIAGNOSIS — R05 Cough: Secondary | ICD-10-CM

## 2018-04-12 DIAGNOSIS — Z8249 Family history of ischemic heart disease and other diseases of the circulatory system: Secondary | ICD-10-CM | POA: Insufficient documentation

## 2018-04-12 DIAGNOSIS — J22 Unspecified acute lower respiratory infection: Secondary | ICD-10-CM | POA: Insufficient documentation

## 2018-04-12 DIAGNOSIS — K219 Gastro-esophageal reflux disease without esophagitis: Secondary | ICD-10-CM | POA: Insufficient documentation

## 2018-04-12 DIAGNOSIS — Z9889 Other specified postprocedural states: Secondary | ICD-10-CM | POA: Insufficient documentation

## 2018-04-12 DIAGNOSIS — Z951 Presence of aortocoronary bypass graft: Secondary | ICD-10-CM | POA: Diagnosis not present

## 2018-04-12 DIAGNOSIS — E785 Hyperlipidemia, unspecified: Secondary | ICD-10-CM | POA: Insufficient documentation

## 2018-04-12 DIAGNOSIS — Z87891 Personal history of nicotine dependence: Secondary | ICD-10-CM | POA: Insufficient documentation

## 2018-04-12 DIAGNOSIS — R0602 Shortness of breath: Secondary | ICD-10-CM | POA: Diagnosis not present

## 2018-04-12 DIAGNOSIS — I10 Essential (primary) hypertension: Secondary | ICD-10-CM | POA: Diagnosis not present

## 2018-04-12 DIAGNOSIS — J069 Acute upper respiratory infection, unspecified: Secondary | ICD-10-CM | POA: Diagnosis present

## 2018-04-12 DIAGNOSIS — N529 Male erectile dysfunction, unspecified: Secondary | ICD-10-CM | POA: Diagnosis not present

## 2018-04-12 DIAGNOSIS — G4733 Obstructive sleep apnea (adult) (pediatric): Secondary | ICD-10-CM | POA: Diagnosis not present

## 2018-04-12 LAB — POCT RAPID STREP A: STREPTOCOCCUS, GROUP A SCREEN (DIRECT): NEGATIVE

## 2018-04-12 MED ORDER — CETIRIZINE HCL 10 MG PO CAPS: 10.0000 mg | ORAL_CAPSULE | Freq: Every day | ORAL | 0 refills | Status: DC

## 2018-04-12 MED ORDER — BENZONATATE 200 MG PO CAPS: 200.0000 mg | ORAL_CAPSULE | Freq: Three times a day (TID) | ORAL | 0 refills | Status: DC | PRN

## 2018-04-12 MED ORDER — AZITHROMYCIN 250 MG PO TABS: 250.0000 mg | ORAL_TABLET | Freq: Every day | ORAL | 0 refills | Status: DC

## 2018-04-12 MED ORDER — LIDOCAINE-EPINEPHRINE (PF) 2 %-1:200000 IJ SOLN
INTRAMUSCULAR | Status: AC
  Filled 2018-04-12: qty 20

## 2018-04-12 NOTE — Discharge Instructions (Signed)
Possible early pneumonia, begin azithromycin, 2 tablets today, 1 tablet for the following 4 days Please use Tessalon as needed for cough Daily allergy pill to help with congestion/drainage contributing to sore throat Drink plenty of fluids, may try honey mixed in hot tea to help with cough and throat discomfort  Please follow-up here in emergency room if developing worsening symptoms, persistent fever, shortness of breath, chest discomfort

## 2018-04-12 NOTE — ED Provider Notes (Signed)
[place]    CSN: [id number] Arrival date & time: 04/12/18  1017     History   Chief Complaint Chief Complaint  Patient presents with  . URI    HPI [name] [name] is a 67 y.o. male history of CAD, hypertension, hyperlipidemia, OSA presenting today for evaluation of sore throat, cough and fever.  Patient states that his symptoms began Wednesday.  He has had a fever off and on, up to 101.  He has been taking Tylenol, last dose around 2 AM this morning.  Cough is been productive, states that he has had a brown sputum.  Also having chills.  Endorsing constant sore throat.  Denies difficulty swallowing.  Mild rhinorrhea.   earPain.  Has tried Mucinex, Zicam and Tylenol.  HPI  Past Medical History:  Diagnosis Date  . Chicken pox   . Coronary artery disease    Severe three-vessel coronary artery disease with ejection fraction of 65%  . GERD (gastroesophageal reflux disease)   . History of gout   . History of psoriasis   . Hypercholesterolemia   . Hypertension   . OSA (obstructive sleep apnea)    Moderate with AHI of 16.9/hr now on CPAP at 10cm H2O  . Psoriatic arthritis [facility])     Patient Active Problem List   Diagnosis Date Noted  . Psoriatic arthritis ([place])   . History of psoriasis   . History of gout   . GERD (gastroesophageal reflux disease)   . Chicken pox   . Persistent atrial fibrillation ([place])   . Erectile dysfunction 05/06/2017  . Overweight (BMI 25.0-29.9) 08/06/2016  . OSA (obstructive sleep apnea) 08/06/2014  . Snoring 05/22/2014  . Hypertension   . Atrial flutter ([place]) 03/14/2014  . Psoriasis 02/27/2013  . Gout 02/27/2013  . S/P CABG (coronary artery bypass graft) 06/29/2011  . Coronary artery disease   . Hypercholesterolemia     Past Surgical History:  Procedure Laterality Date  . ATRIAL FLUTTER ABLATION  03/30/2014  . ATRIAL FLUTTER ABLATION N/A 03/30/2014   Procedure: ATRIAL FLUTTER ABLATION;  Surgeon: Coralyn Mark, MD;  Location: [place]  CATH LAB;  Service: Cardiovascular;  Laterality: N/A;  . CARDIAC CATHETERIZATION  09/2003   Ejection fraction was estimated at 65%.  . CARDIOVERSION N/A 03/15/2014   Procedure: CARDIOVERSION;  Surgeon: Lelon Perla, MD;  Location: [facility] ENDOSCOPY;  Service: Cardiovascular;  Laterality: N/A;  . CARDIOVERSION N/A 07/17/2017   Procedure: CARDIOVERSION;  Surgeon: Sueanne Margarita, MD;  Location: [place];  Service: Cardiovascular;  Laterality: N/A;  . CORONARY ARTERY BYPASS GRAFT  09/2003   Lima-lad,svg-diag,svg-om/distal LCX,svg-pda  . TEE WITHOUT CARDIOVERSION N/A 03/15/2014   Procedure: TRANSESOPHAGEAL ECHOCARDIOGRAM (TEE);  Surgeon: Lelon Perla, MD;  Location: [facility] ENDOSCOPY;  Service: Cardiovascular;  Laterality: N/A;  . TEE WITHOUT CARDIOVERSION N/A 07/17/2017   Procedure: TRANSESOPHAGEAL ECHOCARDIOGRAM (TEE);  Surgeon: Sueanne Margarita, MD;  Location: [facility] ENDOSCOPY;  Service: Cardiovascular;  Laterality: N/A;  . TONSILLECTOMY  1950's       Home Medications    Prior to Admission medications   Medication Sig Start Date End Date Taking? Authorizing Provider  apixaban (ELIQUIS) 5 MG TABS tablet Take 1 tablet (5 mg total) by mouth 2 (two) times daily. 07/03/17   Martinique, Peter M, MD  atorvastatin (LIPITOR) 40 MG tablet TAKE 1 TABLET BY MOUTH DAILY. 03/10/18   Martinique, Peter M, MD  azithromycin (ZITHROMAX) 250 MG tablet Take 1 tablet (250 mg total) by mouth daily. Take  first 2 tablets together, then 1 every day until finished. 04/12/18   Ewan Grau C, PA-C  benzonatate (TESSALON) 200 MG capsule Take 1 capsule (200 mg total) by mouth 3 (three) times daily as needed for up to 7 days for cough. 04/12/18 04/19/18  Alijah Akram C, PA-C  Cetirizine HCl 10 MG CAPS Take 1 capsule (10 mg total) by mouth daily for 10 days. 04/12/18 04/22/18  Antowan Samford C, PA-C  cholecalciferol (VITAMIN D) 1000 UNITS tablet Take 1,000 Units by mouth daily.      [provider]  Coenzyme Q10 (CO Q 10  PO) Take 100 mg by mouth daily.     [provider]  colchicine 0.6 MG tablet TAKE 1 TABLET BY MOUTH TWICE DAILY. 09/30/17   Burchette, Alinda Sierras, MD  furosemide (LASIX) 20 MG tablet Take 20 mg by mouth daily.    [provider]  losartan (COZAAR) 25 MG tablet Take 1 tablet (25 mg total) by mouth daily. 01/30/18   Martinique, Peter M, MD  metoprolol succinate (TOPROL-XL) 25 MG 24 hr tablet TAKE 1 TABLET BY MOUTH TWICE DAILY. 01/02/18   Martinique, Peter M, MD  multivitamin [facility]) per tablet Take 1 tablet by mouth daily.      [provider]  nitroGLYCERIN (NITROSTAT) 0.4 MG SL tablet PLACE 1 TABLET UNDER THE TONGUE EVERY 5 MINUTES AS NEEDED FOR CHEST PAIN. 12/02/17   Martinique, Peter M, MD  potassium chloride SA (K-DUR,KLOR-CON) 20 MEQ tablet Take 1 tablet (20 mEq total) by mouth daily. 07/08/17   Martinique, Peter M, MD  sildenafil (REVATIO) 20 MG tablet TAKE 2 TO 5 TABLETS ONE HOUR PRIOR TO SEXUAL ACTIVITY. 12/13/17   Burchette, Alinda Sierras, MD  tadalafil (ADCIRCA/CIALIS) 20 MG tablet Take 1 tablet (20 mg total) by mouth every other day. 04/10/18   Burchette, Alinda Sierras, MD    Family History Family History  Problem Relation Age of Onset  . Heart disease Mother        cabg  . Cancer Mother        breast  . Stroke Mother     Social History Social History   Tobacco Use  . Smoking status: Former Smoker    Packs/day: 0.50    Years: 5.00    Pack years: 2.50    Types: Cigarettes  . Smokeless tobacco: Never Used  . Tobacco comment: "quit smoking cigarettes in the late 1970's"  Substance Use Topics  . Alcohol use: Yes    Alcohol/week: 4.0 standard drinks    Types: 2 Cans of beer, 2 Shots of liquor per week    Comment: 2 to 3 drinks a wake   . Drug use: No     Allergies   Patient has no known allergies.   Review of Systems Review of Systems  Constitutional: Positive for chills and fever. Negative for activity change, appetite change and fatigue.  HENT: Positive for  congestion, rhinorrhea and sore throat. Negative for ear pain, sinus pressure and trouble swallowing.   Eyes: Negative for discharge and redness.  Respiratory: Positive for cough. Negative for chest tightness and shortness of breath.   Cardiovascular: Negative for chest pain.  Gastrointestinal: Negative for abdominal pain, diarrhea, nausea and vomiting.  Musculoskeletal: Negative for myalgias.  Skin: Negative for rash.  Neurological: Negative for dizziness, light-headedness and headaches.     Physical Exam Triage Vital Signs ED Triage Vitals [04/12/18 1120]  Enc Vitals Group     BP 119/60  Pulse Rate 79     Resp 18     Temp 98.6 F (37 C)     Temp Source Oral     SpO2 100 %     Weight      Height      Head Circumference      Peak Flow      Pain Score      Pain Loc      Pain Edu?      Excl. in [place]?    No data found.  Updated Vital Signs BP 119/60 (BP Location: Right Arm)   Pulse 79   Temp 98.6 F (37 C) (Oral)   Resp 18   SpO2 100%   Visual Acuity Right Eye Distance:   Left Eye Distance:    Bilateral Distance:    Right Eye Near:   Left Eye Near:    Bilateral Near:     Physical Exam  Constitutional: He appears well-developed and well-nourished.  HENT:  Head: Normocephalic and atraumatic.  Bilateral ears without tenderness to palpation of external auricle, tragus and mastoid, EAC's without erythema or swelling, TM's with good bony landmarks and cone of light. Non erythematous.  Oral mucosa pink and moist, no tonsillar enlargement or exudate. Posterior pharynx patent and erythematous, no uvula deviation or swelling. Normal phonation.  Eyes: Conjunctivae are normal.  Neck: Neck supple.  Cardiovascular: Normal rate and regular rhythm.  No murmur heard. Pulmonary/Chest: Effort normal and breath sounds normal. No respiratory distress.  Breathing comfortably at rest, CTABL, no wheezing, rales or other adventitious sounds auscultated  Abdominal: Soft. There is  no tenderness.  Musculoskeletal: He exhibits no edema.  Neurological: He is alert.  Skin: Skin is warm and dry.  Psychiatric: He has a normal mood and affect.  Nursing note and vitals reviewed.    UC Treatments / Results  Labs (all labs ordered are listed, but only abnormal results are displayed) Labs Reviewed  CULTURE, GROUP A STREP [facility])  POCT RAPID STREP A    EKG None  Radiology Dg Chest 2 View  Result Date: 04/12/2018 CLINICAL DATA:  Cough, fever, shortness of breath EXAM: CHEST - 2 VIEW COMPARISON:  03/14/2014 FINDINGS: Stable cardiomegaly and previous coronary bypass changes. Mild central vascular congestion. Slight increased basilar densities compatible with atelectasis versus developing airspace disease or pneumonia when compared to the prior study. No edema, effusion or thorax. Trachea is midline. Aorta is atherosclerotic. No acute osseous finding. IMPRESSION: Stable cardiomegaly and previous coronary bypass changes. Negative for CHF Increased basilar opacities favored to represent atelectasis. Difficult to exclude early mild basilar pneumonia. Electronically Signed   By: Jerilynn Mages.  Shick M.D.   On: 04/12/2018 12:03    Procedures Procedures (including critical care time)  Medications Ordered in UC Medications - No data to display  Initial Impression / Assessment and Plan / UC Course  I have reviewed the triage vital signs and the nursing notes.  Pertinent labs & imaging results that were available during my care of the patient were reviewed by me and considered in my medical decision making (see chart for details).     Chest x-ray showing possible early pneumonia.  Will initiate treatment with azithromycin.  Will continue further symptomatic treatment, Tessalon for cough.  Will provide daily allergy pill to help with any drainage/congestion contributing to sore throat.  Push fluids.Discussed strict return precautions. Patient verbalized understanding and is agreeable with  plan.  Final Clinical Impressions(s) / UC Diagnoses   Final diagnoses:  Lower respiratory infection (e.g., bronchitis, pneumonia, pneumonitis, pulmonitis)     Discharge Instructions     Possible early pneumonia, begin azithromycin, 2 tablets today, 1 tablet for the following 4 days Please use Tessalon as needed for cough Daily allergy pill to help with congestion/drainage contributing to sore throat Drink plenty of fluids, may try honey mixed in hot tea to help with cough and throat discomfort  Please follow-up here in emergency room if developing worsening symptoms, persistent fever, shortness of breath, chest discomfort   ED Prescriptions    Medication Sig Dispense Auth. Provider   azithromycin (ZITHROMAX) 250 MG tablet Take 1 tablet (250 mg total) by mouth daily. Take first 2 tablets together, then 1 every day until finished. 6 tablet Alden Feagan C, PA-C   Cetirizine HCl 10 MG CAPS Take 1 capsule (10 mg total) by mouth daily for 10 days. 10 capsule Nohelia Valenza C, PA-C   benzonatate (TESSALON) 200 MG capsule Take 1 capsule (200 mg total) by mouth 3 (three) times daily as needed for up to 7 days for cough. 28 capsule Jennica Tagliaferri C, PA-C     Controlled Substance Prescriptions [place] Controlled Substance Registry consulted? Not Applicable   Janith Lima, [place] 04/12/18 1424

## 2018-04-12 NOTE — ED Triage Notes (Signed)
Pt sts URI sx with sore throat and fever

## 2018-04-14 ENCOUNTER — Other Ambulatory Visit: Payer: Self-pay | Admitting: Cardiology

## 2018-04-14 ENCOUNTER — Encounter: Payer: Self-pay | Admitting: Family Medicine

## 2018-04-14 ENCOUNTER — Ambulatory Visit (INDEPENDENT_AMBULATORY_CARE_PROVIDER_SITE_OTHER): Payer: PPO | Admitting: Family Medicine

## 2018-04-14 VITALS — BP 110/70 | HR 92 | Temp 98.9°F | Wt 201.2 lb

## 2018-04-14 DIAGNOSIS — R05 Cough: Secondary | ICD-10-CM | POA: Diagnosis not present

## 2018-04-14 DIAGNOSIS — G4733 Obstructive sleep apnea (adult) (pediatric): Secondary | ICD-10-CM | POA: Diagnosis not present

## 2018-04-14 DIAGNOSIS — R059 Cough, unspecified: Secondary | ICD-10-CM

## 2018-04-14 LAB — CULTURE, GROUP A STREP (THRC)

## 2018-04-14 MED ORDER — HYDROCODONE-HOMATROPINE 5-1.5 MG/5ML PO SYRP: 5.0000 mL | ORAL_SOLUTION | Freq: Four times a day (QID) | ORAL | 0 refills | Status: DC | PRN

## 2018-04-14 NOTE — Progress Notes (Signed)
Subjective:     Patient ID: [name] [name], male   DOB: 03-24-1951, 67 y.o.   MRN: [id number]  HPI Patient seen for urgent care follow-up. He was seen there Saturday with increased cough and fever. He had x-ray which showed question of basilar opacities from pneumonia versus bibasilar atelectasis. History of Zithromax. His fever has resolved and overall he feels better. His cough was productive of brown sputum and now mostly clear. No nausea or vomiting. He is planning to go out of town Wednesday to [place] and will be flying. Patient is nonsmoker. He is on eliquis with history of atrial fibrillation.  He was prescribed Tessalon for cough but this has not helped his cough any. Has had fairly severe cough at night. No wheezing.  Past Medical History:  Diagnosis Date  . Chicken pox   . Coronary artery disease    Severe three-vessel coronary artery disease with ejection fraction of 65%  . GERD (gastroesophageal reflux disease)   . History of gout   . History of psoriasis   . Hypercholesterolemia   . Hypertension   . OSA (obstructive sleep apnea)    Moderate with AHI of 16.9/hr now on CPAP at 10cm H2O  . Psoriatic arthritis [facility])    Past Surgical History:  Procedure Laterality Date  . ATRIAL FLUTTER ABLATION  03/30/2014  . ATRIAL FLUTTER ABLATION N/A 03/30/2014   Procedure: ATRIAL FLUTTER ABLATION;  Surgeon: Coralyn Mark, MD;  Location: [place] CATH LAB;  Service: Cardiovascular;  Laterality: N/A;  . CARDIAC CATHETERIZATION  09/2003   Ejection fraction was estimated at 65%.  . CARDIOVERSION N/A 03/15/2014   Procedure: CARDIOVERSION;  Surgeon: Lelon Perla, MD;  Location: [facility] ENDOSCOPY;  Service: Cardiovascular;  Laterality: N/A;  . CARDIOVERSION N/A 07/17/2017   Procedure: CARDIOVERSION;  Surgeon: Sueanne Margarita, MD;  Location: [place];  Service: Cardiovascular;  Laterality: N/A;  . CORONARY ARTERY BYPASS GRAFT  09/2003   Lima-lad,svg-diag,svg-om/distal LCX,svg-pda  . TEE WITHOUT  CARDIOVERSION N/A 03/15/2014   Procedure: TRANSESOPHAGEAL ECHOCARDIOGRAM (TEE);  Surgeon: Lelon Perla, MD;  Location: [facility] ENDOSCOPY;  Service: Cardiovascular;  Laterality: N/A;  . TEE WITHOUT CARDIOVERSION N/A 07/17/2017   Procedure: TRANSESOPHAGEAL ECHOCARDIOGRAM (TEE);  Surgeon: Sueanne Margarita, MD;  Location: [facility] ENDOSCOPY;  Service: Cardiovascular;  Laterality: N/A;  . TONSILLECTOMY  1950's    reports that he has quit smoking. His smoking use included cigarettes. He has a 2.50 pack-year smoking history. He has never used smokeless tobacco. He reports that he drinks about 4.0 standard drinks of alcohol per week. He reports that he does not use drugs. family history includes Cancer in his mother; Heart disease in his mother; Stroke in his mother. No Known Allergies   Review of Systems  Constitutional: Negative for chills and fever.  Respiratory: Positive for cough. Negative for shortness of breath and wheezing.   Cardiovascular: Negative for chest pain.  Gastrointestinal: Negative for nausea and vomiting.       Objective:   Physical Exam  Constitutional: He appears well-developed and well-nourished.  Cardiovascular: Normal rate.  Pulmonary/Chest: Effort normal and breath sounds normal.  Musculoskeletal: He exhibits no edema.       Assessment:     Cough. Chest x-ray recently brought up issue of possible early bibasilar pneumonia. Clinically improved after initiation of Zithromax.    Plan:     -finish out Zithromax -Follow-up promptly for any recurrent fever or increased shortness of breath -We gave him 1 refill of Hycodan cough  syrup 1 teaspoon every 6 hours for severe cough 120 mL's with no refill -he has questions regarding flying. Since he is afebrile and doing much better at this time and no hypoxia, tachypnea, or other complicating factors he should be able to fly by middle of week hopefully.  [name] MD [facility]

## 2018-04-14 NOTE — Patient Instructions (Signed)
Follow up promptly for any fever or increased shortness of breath. 

## 2018-04-22 ENCOUNTER — Other Ambulatory Visit: Payer: Self-pay | Admitting: Family Medicine

## 2018-04-22 MED ORDER — HYDROCODONE-HOMATROPINE 5-1.5 MG/5ML PO SYRP: 5.0000 mL | ORAL_SOLUTION | Freq: Four times a day (QID) | ORAL | 0 refills | Status: AC | PRN

## 2018-04-22 NOTE — Telephone Encounter (Signed)
Last OV 04/14/18, No future OV  Last filled 04/14/18, 120 mL with 0 refills  Please advise if okay to fill.

## 2018-04-22 NOTE — Telephone Encounter (Signed)
Refilled once. 

## 2018-04-22 NOTE — Telephone Encounter (Signed)
Copied from [place] [phone]. Topic: Quick Communication - Rx Refill/Question >> Apr 22, 2018  2:59 PM Scherrie Gerlach wrote: Medication: HYDROcodone-homatropine (HYCODAN) 5-1.5 MG/5ML syrup  Pt requesting refill, states dr Elease Hashimoto said if he needed to just call. [place], [place] [phone] (Phone) [phone] (Fax)

## 2018-04-22 NOTE — Addendum Note (Signed)
Addended by: [name] on: 04/22/2018 04:35 PM   Modules accepted: Orders

## 2018-04-25 ENCOUNTER — Ambulatory Visit (INDEPENDENT_AMBULATORY_CARE_PROVIDER_SITE_OTHER): Payer: PPO

## 2018-04-25 ENCOUNTER — Ambulatory Visit (INDEPENDENT_AMBULATORY_CARE_PROVIDER_SITE_OTHER): Payer: PPO | Admitting: Family Medicine

## 2018-04-25 ENCOUNTER — Encounter: Payer: Self-pay | Admitting: Family Medicine

## 2018-04-25 VITALS — BP 124/82 | HR 71 | Temp 98.0°F | Resp 12 | Ht 72.5 in | Wt 202.2 lb

## 2018-04-25 DIAGNOSIS — J069 Acute upper respiratory infection, unspecified: Secondary | ICD-10-CM | POA: Diagnosis not present

## 2018-04-25 DIAGNOSIS — R059 Cough, unspecified: Secondary | ICD-10-CM

## 2018-04-25 DIAGNOSIS — R05 Cough: Secondary | ICD-10-CM | POA: Diagnosis not present

## 2018-04-25 NOTE — Progress Notes (Signed)
ACUTE VISIT   HPI:  Chief Complaint  Patient presents with  . Pneumonia    cough getting worse with brown phlem    Mr.Ronnell Jerilynn Mages Davidoff is a 67 y.o. male with history of CAD, atrial fibrillation, and OSA, who is here today complaining of worsening cough since Monday.  He is concerned about possible pneumonia. He was treated for pneumonia on 04/12/2018, he completed treatment with azithromycin. He states that the cough improved but never resolved and has been mildly worse for the past 5 days. A couple days ago he started with brownish sputum.  He denies hemoptysis. Former smoker. He has not noted fever, chills, chest pain, dyspnea, or wheezing.  He has had nasal congestion, rhinorrhea, postnasal drainage. + Ear fullness sensation,intermittetly.  CXR 04/12/18: Stable cardiomegaly and previous coronary bypass changes. Negative for CHF. Increased basilar opacities favored to represent atelectasis. Difficult to exclude early mild basilar pneumonia.  Negative for sick contact or overseas travel.  He is taking Hycodan at bedtime.  He has taken OTC Mucinex. History of allergic rhinitis, currently he is on cetirizine 10 mg daily.  He has history of GERD, denies heartburn, acid reflux, nausea, vomiting, or abdominal pain.  Review of Systems  Constitutional: Positive for fatigue. Negative for appetite change, chills and fever.  HENT: Positive for congestion and rhinorrhea. Negative for ear pain, mouth sores, postnasal drip, sore throat, trouble swallowing and voice change.   Eyes: Negative for discharge, redness and itching.  Respiratory: Positive for cough. Negative for chest tightness, shortness of breath and wheezing.   Cardiovascular: Negative for chest pain and leg swelling.  Gastrointestinal: Negative for abdominal pain, diarrhea, nausea and vomiting.  Musculoskeletal: Negative for gait problem and myalgias.  Skin: Negative for rash.  Allergic/Immunologic: Positive  for environmental allergies.  Neurological: Negative for weakness and headaches.      Current Outpatient Medications on File Prior to Visit  Medication Sig Dispense Refill  . apixaban (ELIQUIS) 5 MG TABS tablet Take 1 tablet (5 mg total) by mouth 2 (two) times daily. 60 tablet 11  . atorvastatin (LIPITOR) 40 MG tablet TAKE 1 TABLET BY MOUTH DAILY. 30 tablet 8  . cholecalciferol (VITAMIN D) 1000 UNITS tablet Take 1,000 Units by mouth daily.      . Coenzyme Q10 (CO Q 10 PO) Take 100 mg by mouth daily.     . colchicine 0.6 MG tablet TAKE 1 TABLET BY MOUTH TWICE DAILY. 90 tablet 1  . furosemide (LASIX) 20 MG tablet Take 20 mg by mouth daily.    Marland Kitchen HYDROcodone-homatropine (HYCODAN) 5-1.5 MG/5ML syrup Take 5 mLs by mouth every 6 (six) hours as needed for up to 10 days. 120 mL 0  . losartan (COZAAR) 25 MG tablet Take 1 tablet (25 mg total) by mouth daily. 90 tablet 3  . metoprolol succinate (TOPROL-XL) 25 MG 24 hr tablet TAKE 1 TABLET BY MOUTH TWICE DAILY. 60 tablet 3  . multivitamin (THERAGRAN) per tablet Take 1 tablet by mouth daily.      . nitroGLYCERIN (NITROSTAT) 0.4 MG SL tablet PLACE 1 TABLET UNDER THE TONGUE EVERY 5 MINUTES AS NEEDED FOR CHEST PAIN. 25 tablet 5  . potassium chloride SA (K-DUR,KLOR-CON) 20 MEQ tablet Take 1 tablet (20 mEq total) by mouth daily. 30 tablet 6  . tadalafil (ADCIRCA/CIALIS) 20 MG tablet Take 1 tablet (20 mg total) by mouth every other day. (Patient taking differently: Take 20 mg by mouth every other day. Take one every other  day as needed.) 6 tablet 11  . Cetirizine HCl 10 MG CAPS Take 1 capsule (10 mg total) by mouth daily for 10 days. 10 capsule 0   No current facility-administered medications on file prior to visit.      Past Medical History:  Diagnosis Date  . Chicken pox   . Coronary artery disease    Severe three-vessel coronary artery disease with ejection fraction of 65%  . GERD (gastroesophageal reflux disease)   . History of gout   . History of  psoriasis   . Hypercholesterolemia   . Hypertension   . OSA (obstructive sleep apnea)    Moderate with AHI of 16.9/hr now on CPAP at 10cm H2O  . Psoriatic arthritis ([place])    No Known Allergies  Social History   Socioeconomic History  . Marital status: Married    Spouse name: Not on file  . Number of children: 1  . Years of education: Not on file  . Highest education level: Not on file  Occupational History  . Occupation: Print production planner: [employer]  . Financial resource strain: Not on file  . Food insecurity:    Worry: Not on file    Inability: Not on file  . Transportation needs:    Medical: Not on file    Non-medical: Not on file  Tobacco Use  . Smoking status: Former Smoker    Packs/day: 0.50    Years: 5.00    Pack years: 2.50    Types: Cigarettes  . Smokeless tobacco: Never Used  . Tobacco comment: "quit smoking cigarettes in the late 1970's"  Substance and Sexual Activity  . Alcohol use: Yes    Alcohol/week: 4.0 standard drinks    Types: 2 Cans of beer, 2 Shots of liquor per week    Comment: 2 to 3 drinks a wake   . Drug use: No  . Sexual activity: Yes  Lifestyle  . Physical activity:    Days per week: Not on file    Minutes per session: Not on file  . Stress: Not on file  Relationships  . Social connections:    Talks on phone: Not on file    Gets together: Not on file    Attends religious service: Not on file    Active member of club or organization: Not on file    Attends meetings of clubs or organizations: Not on file    Relationship status: Not on file  Other Topics Concern  . Not on file  Social History Narrative  . Not on file    Vitals:   04/25/18 1105  BP: 124/82  Pulse: 71  Resp: 12  Temp: 98 F (36.7 C)  SpO2: 97%   Body mass index is 27.05 kg/m.   Physical Exam  Nursing note and vitals reviewed. Constitutional: He is oriented to person, place, and time. He appears well-developed and  well-nourished. He does not appear ill. No distress.  HENT:  Head: Normocephalic and atraumatic.  Right Ear: External ear and ear canal normal. Tympanic membrane is not erythematous. A middle ear effusion is present.  Left Ear: External ear and ear canal normal. Tympanic membrane is not erythematous. A middle ear effusion is present.  Nose: Rhinorrhea present. Right sinus exhibits no maxillary sinus tenderness and no frontal sinus tenderness. Left sinus exhibits no maxillary sinus tenderness and no frontal sinus tenderness.  Mouth/Throat: Oropharynx is clear and moist and mucous membranes are normal.  Eyes: Conjunctivae and EOM are normal.  Cardiovascular: Normal rate and regular rhythm.  No murmur heard. Respiratory: Effort normal and breath sounds normal. No stridor. No respiratory distress.  Lymphadenopathy:    He has no cervical adenopathy.  Neurological: He is alert and oriented to person, place, and time. He has normal strength. Gait normal.  Skin: Skin is warm. No rash noted. No erythema.  Psychiatric: He has a normal mood and affect.  Well groomed, good eye contact.     ASSESSMENT AND PLAN:  Mr. Yanuel was seen today for pneumonia.  Diagnoses and all orders for this visit:  Cough  Explained that cough after a URI or respiratory infection can last a few weeks and even a couple months. We discussed other possible etiologies. Lung auscultation is clear. Further recommendation will be given according to imaging results.  -     DG Chest 2 View; Future  URI, acute  Symptoms suggests a viral etiology, for now I am recommending symptomatic treatment. Instructed to monitor for signs of complications, including new onset of fever among some, clearly instructed about warning signs.  F/U as needed.  Addendum: CXR  1.  Prior CABG.  Stable cardiomegaly. 2. Persistent but improved bibasilar atelectasis/infiltrates. Mild right mid lung field subsegmental atelectasis noted on  today's exam. Continued follow-up exams suggested to demonstrate clearing.  Doxycycline sent to his pharmacy. Recommend repeating CXR in 3-4 weeks.   Return if symptoms worsen or fail to improve.      Betty G. Martinique, MD  [facility]. [place] office.

## 2018-04-25 NOTE — Patient Instructions (Signed)
A few things to remember from today's visit:   Cough - Plan: DG Chest 2 View  URI, acute  Let me know in fever in the next few days.  Continue plain Mucinex.

## 2018-04-27 ENCOUNTER — Encounter: Payer: Self-pay | Admitting: Family Medicine

## 2018-04-27 MED ORDER — DOXYCYCLINE HYCLATE 100 MG PO TABS: 100.0000 mg | ORAL_TABLET | Freq: Two times a day (BID) | ORAL | 0 refills | Status: AC

## 2018-05-02 ENCOUNTER — Other Ambulatory Visit: Payer: Self-pay

## 2018-05-02 ENCOUNTER — Ambulatory Visit (HOSPITAL_COMMUNITY): Payer: PPO | Attending: Cardiology

## 2018-05-02 DIAGNOSIS — I2510 Atherosclerotic heart disease of native coronary artery without angina pectoris: Secondary | ICD-10-CM | POA: Diagnosis not present

## 2018-05-02 DIAGNOSIS — I4819 Other persistent atrial fibrillation: Secondary | ICD-10-CM

## 2018-05-02 DIAGNOSIS — I10 Essential (primary) hypertension: Secondary | ICD-10-CM | POA: Diagnosis not present

## 2018-05-02 DIAGNOSIS — I351 Nonrheumatic aortic (valve) insufficiency: Secondary | ICD-10-CM | POA: Diagnosis not present

## 2018-05-02 DIAGNOSIS — Z951 Presence of aortocoronary bypass graft: Secondary | ICD-10-CM | POA: Diagnosis not present

## 2018-05-18 ENCOUNTER — Other Ambulatory Visit: Payer: Self-pay | Admitting: Cardiology

## 2018-05-21 ENCOUNTER — Encounter: Payer: Self-pay | Admitting: Family Medicine

## 2018-05-21 ENCOUNTER — Ambulatory Visit (INDEPENDENT_AMBULATORY_CARE_PROVIDER_SITE_OTHER): Payer: PPO

## 2018-05-21 ENCOUNTER — Other Ambulatory Visit: Payer: Self-pay

## 2018-05-21 ENCOUNTER — Ambulatory Visit (INDEPENDENT_AMBULATORY_CARE_PROVIDER_SITE_OTHER): Payer: PPO | Admitting: Family Medicine

## 2018-05-21 VITALS — BP 124/70 | HR 69 | Temp 98.2°F | Ht 72.5 in | Wt 201.9 lb

## 2018-05-21 DIAGNOSIS — R05 Cough: Secondary | ICD-10-CM | POA: Diagnosis not present

## 2018-05-21 DIAGNOSIS — R9389 Abnormal findings on diagnostic imaging of other specified body structures: Secondary | ICD-10-CM

## 2018-05-21 DIAGNOSIS — I878 Other specified disorders of veins: Secondary | ICD-10-CM

## 2018-05-21 DIAGNOSIS — Z981 Arthrodesis status: Secondary | ICD-10-CM | POA: Diagnosis not present

## 2018-05-21 DIAGNOSIS — R059 Cough, unspecified: Secondary | ICD-10-CM

## 2018-05-21 NOTE — Patient Instructions (Signed)

## 2018-05-21 NOTE — Progress Notes (Signed)
Subjective:     Patient ID: [name] [name], male   DOB: 1951/02/25, 67 y.o.   MRN: [id number]  HPI Patient here for follow-up of possible recent bibasilar pneumonia.  He had developed some cough and had been seen elsewhere back in September and initially treated with Zithromax.  He was then seen here a few weeks ago with increased cough which was productive but no fever.  X-ray follow-up showed question of bibasilar atelectasis versus infiltrates.  He was placed on doxycycline.  He states his cough has essentially resolved since then.  No dyspnea.  He went hiking in the mountains this past weekend without difficulty.  Recommendation was to get follow-up chest x-ray to show resolution and continued improvement of chest x-ray  Other issues he has had some pigment changes in his lower legs and some chronic leg edema.  He has history of recurrent atrial fibrillation and is maintained on Lasix.  He notices increased sock line end of day.  He has compression garments at home but not using regularly.  No orthopnea.  No history of any leg ulcers. Remains on Eliquis  Past Medical History:  Diagnosis Date  . Chicken pox   . Coronary artery disease    Severe three-vessel coronary artery disease with ejection fraction of 65%  . GERD (gastroesophageal reflux disease)   . History of gout   . History of psoriasis   . Hypercholesterolemia   . Hypertension   . OSA (obstructive sleep apnea)    Moderate with AHI of 16.9/hr now on CPAP at 10cm H2O  . Psoriatic arthritis [facility])    Past Surgical History:  Procedure Laterality Date  . ATRIAL FLUTTER ABLATION  03/30/2014  . ATRIAL FLUTTER ABLATION N/A 03/30/2014   Procedure: ATRIAL FLUTTER ABLATION;  Surgeon: Coralyn Mark, MD;  Location: [place] CATH LAB;  Service: Cardiovascular;  Laterality: N/A;  . CARDIAC CATHETERIZATION  09/2003   Ejection fraction was estimated at 65%.  . CARDIOVERSION N/A 03/15/2014   Procedure: CARDIOVERSION;  Surgeon: Lelon Perla, MD;   Location: [facility] ENDOSCOPY;  Service: Cardiovascular;  Laterality: N/A;  . CARDIOVERSION N/A 07/17/2017   Procedure: CARDIOVERSION;  Surgeon: Sueanne Margarita, MD;  Location: [facility];  Service: Cardiovascular;  Laterality: N/A;  . CORONARY ARTERY BYPASS GRAFT  09/2003   Lima-lad,svg-diag,svg-om/distal LCX,svg-pda  . TEE WITHOUT CARDIOVERSION N/A 03/15/2014   Procedure: TRANSESOPHAGEAL ECHOCARDIOGRAM (TEE);  Surgeon: Lelon Perla, MD;  Location: [facility] ENDOSCOPY;  Service: Cardiovascular;  Laterality: N/A;  . TEE WITHOUT CARDIOVERSION N/A 07/17/2017   Procedure: TRANSESOPHAGEAL ECHOCARDIOGRAM (TEE);  Surgeon: Sueanne Margarita, MD;  Location: [facility] ENDOSCOPY;  Service: Cardiovascular;  Laterality: N/A;  . TONSILLECTOMY  1950's    reports that he has quit smoking. His smoking use included cigarettes. He has a 2.50 pack-year smoking history. He has never used smokeless tobacco. He reports that he drinks about 4.0 standard drinks of alcohol per week. He reports that he does not use drugs. family history includes Cancer in his mother; Heart disease in his mother; Stroke in his mother. No Known Allergies   Review of Systems  Constitutional: Negative for appetite change, chills, fever and unexpected weight change.  HENT: Negative for congestion.   Respiratory: Negative for cough, shortness of breath and wheezing.   Cardiovascular: Positive for leg swelling. Negative for chest pain.       Objective:   Physical Exam  Constitutional: He appears well-developed and well-nourished.  Cardiovascular: Normal rate.  Pulmonary/Chest: Effort normal and breath  sounds normal. He has no wheezes. He has no rales.  Musculoskeletal:  Trace edema legs bilaterally.  Both feet are warm to touch.  Excellent capillary refill.  Slightly diminished dorsalis pedis pulse right foot compared to left.  He has 1+ posterior tibial bilaterally.  He has some superficial varicosities in both feet and ankles.  No calf tenderness.        Assessment:     #1 history of recent cough with bibasilar atelectasis versus bibasilar pneumonia clinically improving following 2 rounds of antibiotic initially Zithromax and then subsequent doxycycline.  Clinically improved at this time  #2 peripheral edema.  Suspect venous stasis edema.  He has some mild pigmentation from hemosiderin deposits.  No ulceration.    Plan:     -Obtain repeat chest x-ray -Recommend compression knee-high garments lower extremities daily -Reassurance that he has no evidence for any claudication or any impaired circulation lower extremities  [name] MD [facility]

## 2018-05-23 ENCOUNTER — Other Ambulatory Visit: Payer: Self-pay | Admitting: Family Medicine

## 2018-05-27 DIAGNOSIS — D485 Neoplasm of uncertain behavior of skin: Secondary | ICD-10-CM | POA: Diagnosis not present

## 2018-05-27 DIAGNOSIS — L400 Psoriasis vulgaris: Secondary | ICD-10-CM | POA: Diagnosis not present

## 2018-05-27 DIAGNOSIS — L814 Other melanin hyperpigmentation: Secondary | ICD-10-CM | POA: Diagnosis not present

## 2018-05-27 DIAGNOSIS — D1801 Hemangioma of skin and subcutaneous tissue: Secondary | ICD-10-CM | POA: Diagnosis not present

## 2018-05-27 DIAGNOSIS — Z85820 Personal history of malignant melanoma of skin: Secondary | ICD-10-CM | POA: Diagnosis not present

## 2018-05-27 DIAGNOSIS — D229 Melanocytic nevi, unspecified: Secondary | ICD-10-CM | POA: Diagnosis not present

## 2018-05-27 DIAGNOSIS — L821 Other seborrheic keratosis: Secondary | ICD-10-CM | POA: Diagnosis not present

## 2018-07-09 ENCOUNTER — Other Ambulatory Visit: Payer: Self-pay | Admitting: Cardiology

## 2018-07-28 NOTE — Progress Notes (Signed)
Cardiology Office Note   Date:  07/29/2018   ID:  [name] [name], [name] [name] 18-Jun-1951, MRN [id number]  PCP:  [name], MD  Cardiologist:   Yong Wahlquist Martinique, MD   Chief Complaint  Patient presents with  . Edema    states having a little in both ankles but more in the left ankle       History of Present Illness: [name] [name] is a 68 y.o. male who is seen for follow up Afib and CHF. He has a history of  atrial flutter and CAD. He is status post CABG in 2005. He had a normal stress echo in May of 2013. In August 2015 he presented with atrial flutter with RVR. He had mildly elevated troponins. He had successful TEE guided DCCV. He then underwent ablation of atrial flutter and ectopic atrial tachycardia on 03/30/14 by Dr. [name].  Stress Myoview September 2015 demonstrated excellent exercise tolerance and normal perfusion. TEE showed normal LV function, mild biatrial enlargement, mild MR,TR,AI. He does have moderate obstructive sleep apnea followed by Dr. Radford Pax and is on CPAP therapy.  He was seen in December with  an irregular and fast pulse. Was found to be in AFib with RVR. Echo showed EF 40-45% with moderate MR and AI and moderate biatrial enlargement. Toprol dose was increased and he was started on Eliquis. Underwent TEE guided DCCV on 07/17/17. Echo showed EF 40% with moderate biatrial enlargement. Moderate to severe MR, moderate TR, patent foramen ovale. He was successfully cardioverted but had recurrence of Afib on 07/24/17. He was seen in Afib clinic with Dr. [name] for consideration of Afib ablation versus AAD therapy. There was discussion about starting him on Tikosyn. Given new LV dysfunction and valvular disease it was felt that right and left heart cath were indicated prior to initiation of AAD therapy.  He also wore a monitor that showed episodes of wide complex tachycardia and it was unclear whether this was aberrancy versus VT. After our last visit he got a  second opinion at  [place] with Dr. [name]. He was admitted in February and had a nuclear stress test showing no ischemia. EF 44%. He was loaded with Tikosyn and DCCV was performed. he had significant QT prolongation and Tikosyn was discontinued. He later underwent Cardiac MRI. He underwent AFib ablation with pulmonary vein isolation on 10/15/17. Echo following ablation as noted below.  Both MRI and Echo showed evidence of moderate to severe AI. LV function had returned to normal with restoration of NSR.  On follow up today he is feeling very well. He is exercising at the gym 3 days a week and walking on other days. He denies any dyspnea, chest pain or palpitations. Has some swelling in ankles at the end of the day L>R. Energy level is good.     Past Medical History:  Diagnosis Date  . Chicken pox   . Coronary artery disease    Severe three-vessel coronary artery disease with ejection fraction of 65%  . GERD (gastroesophageal reflux disease)   . History of gout   . History of psoriasis   . Hypercholesterolemia   . Hypertension   . OSA (obstructive sleep apnea)    Moderate with AHI of 16.9/hr now on CPAP at 10cm H2O  . Psoriatic arthritis [facility])     Past Surgical History:  Procedure Laterality Date  . ATRIAL FLUTTER ABLATION  03/30/2014  . ATRIAL FLUTTER ABLATION N/A 03/30/2014   Procedure: ATRIAL FLUTTER ABLATION;  Surgeon: Coralyn Mark, MD;  Location: [facility] CATH LAB;  Service: Cardiovascular;  Laterality: N/A;  . CARDIAC CATHETERIZATION  09/2003   Ejection fraction was estimated at 65%.  . CARDIOVERSION N/A 03/15/2014   Procedure: CARDIOVERSION;  Surgeon: Lelon Perla, MD;  Location: [facility] ENDOSCOPY;  Service: Cardiovascular;  Laterality: N/A;  . CARDIOVERSION N/A 07/17/2017   Procedure: CARDIOVERSION;  Surgeon: Sueanne Margarita, MD;  Location: [place];  Service: Cardiovascular;  Laterality: N/A;  . CORONARY ARTERY BYPASS GRAFT  09/2003   Lima-lad,svg-diag,svg-om/distal LCX,svg-pda  . TEE WITHOUT  CARDIOVERSION N/A 03/15/2014   Procedure: TRANSESOPHAGEAL ECHOCARDIOGRAM (TEE);  Surgeon: Lelon Perla, MD;  Location: [facility] ENDOSCOPY;  Service: Cardiovascular;  Laterality: N/A;  . TEE WITHOUT CARDIOVERSION N/A 07/17/2017   Procedure: TRANSESOPHAGEAL ECHOCARDIOGRAM (TEE);  Surgeon: Sueanne Margarita, MD;  Location: [facility] ENDOSCOPY;  Service: Cardiovascular;  Laterality: N/A;  . TONSILLECTOMY  1950's     Current Outpatient Medications  Medication Sig Dispense Refill  . atorvastatin (LIPITOR) 40 MG tablet TAKE 1 TABLET BY MOUTH DAILY. 30 tablet 8  . cholecalciferol (VITAMIN D) 1000 UNITS tablet Take 1,000 Units by mouth daily.      . Coenzyme Q10 (CO Q 10 PO) Take 100 mg by mouth daily.     . colchicine 0.6 MG tablet TAKE 1 TABLET BY MOUTH TWICE DAILY. 90 tablet 0  . ELIQUIS 5 MG TABS tablet TAKE 1 TABLET BY MOUTH TWICE DAILY. 60 tablet 3  . furosemide (LASIX) 20 MG tablet Take 20 mg by mouth daily.    Marland Kitchen losartan (COZAAR) 25 MG tablet Take 1 tablet (25 mg total) by mouth daily. 90 tablet 3  . metoprolol succinate (TOPROL-XL) 25 MG 24 hr tablet TAKE 1 TABLET BY MOUTH TWICE DAILY. 60 tablet 4  . multivitamin (THERAGRAN) per tablet Take 1 tablet by mouth daily.      . nitroGLYCERIN (NITROSTAT) 0.4 MG SL tablet PLACE 1 TABLET UNDER THE TONGUE EVERY 5 MINUTES AS NEEDED FOR CHEST PAIN. 25 tablet 5  . potassium chloride SA (K-DUR,KLOR-CON) 20 MEQ tablet Take 1 tablet (20 mEq total) by mouth daily. 30 tablet 6  . tadalafil (ADCIRCA/CIALIS) 20 MG tablet Take 1 tablet (20 mg total) by mouth every other day. (Patient taking differently: Take 20 mg by mouth every other day. Take one every other day as needed.) 6 tablet 11   No current facility-administered medications for this visit.     Allergies:   Patient has no known allergies.    Social History:  The patient  reports that he has quit smoking. His smoking use included cigarettes. He has a 2.50 pack-year smoking history. He has never used smokeless  tobacco. He reports current alcohol use of about 4.0 standard drinks of alcohol per week. He reports that he does not use drugs.   Family History:  The patient's family history includes Cancer in his mother; Heart disease in his mother; Stroke in his mother.    ROS:  Please see the history of present illness.   Otherwise, review of systems are positive for none.   All other systems are reviewed and negative.    PHYSICAL EXAM: VS:  BP 132/70 (BP Location: Right Arm, Cuff Size: Normal)   Pulse (!) 59   Ht 6\' 1"  (1.854 m)   Wt 204 lb 9.6 oz (92.8 kg)   BMI 26.99 kg/m  , BMI Body mass index is 26.99 kg/m. GENERAL:  Well appearing WM in NAD HEENT:  PERRL, EOMI,  sclera are clear. Oropharynx is clear. NECK:  No jugular venous distention, carotid upstroke brisk and symmetric, no bruits, no thyromegaly or adenopathy LUNGS:  Clear to auscultation bilaterally CHEST:  Unremarkable HEART:  RRR with occ. Extrasystole,  PMI not displaced or sustained,S1 and S2 within normal limits, no S3, no S4: no clicks, no rubs, very soft 1/6 diastolic murmur. ABD:  Soft, nontender. BS +, no masses or bruits. No hepatomegaly, no splenomegaly EXT:  2 + pulses throughout, no edema, no cyanosis no clubbing SKIN:  Warm and dry.  No rashes NEURO:  Alert and oriented x 3. Cranial nerves II through XII intact. PSYCH:  Cognitively intact   Recent Labs: 08/13/2017: BUN 13; Creatinine, Ser 1.01; Hemoglobin 15.7; Platelets 163; Potassium 4.9; Sodium 140  Dated 05/08/17: normal CMET and CBC  Lipid Panel    Component Value Date/Time   CHOL 153 02/14/2017 0000   TRIG 65 02/14/2017 0000   HDL 71 02/14/2017 0000   CHOLHDL 2.2 02/14/2017 0000   CHOLHDL 3 11/23/2015 0837   VLDL 16.4 11/23/2015 0837   LDLCALC 69 02/14/2017 0000      Wt Readings from Last 3 Encounters:  07/29/18 204 lb 9.6 oz (92.8 kg)  05/21/18 201 lb 14.4 oz (91.6 kg)  04/25/18 202 lb 4 oz (91.7 kg)    Ecg today shows NSR with PACs. Otherwise  normal. I have personally reviewed and interpreted this study.   Other studies Reviewed: Additional studies/ records that were reviewed today include: none.  ETT 03/06/17: Study Highlights     Blood pressure demonstrated a hypertensive response to exercise.  Upsloping ST segment depression ST segment depression of 1 mm was noted during stress in the II, III, aVF, V4 and V5 leads, and returning to baseline after less than 1 minute of recovery.   1. Good exercise tolerance.  2. Upsloping ST depression in inferior leads and V4/V5.  This is nonspecific.   No evidence for ischemia.    Echo 07/08/17: Study Conclusions  - Left ventricle: The cavity size was normal. Wall thickness was   increased in a pattern of moderate LVH. Systolic function was   mildly to moderately reduced. The estimated ejection fraction was   in the range of 40% to 45%. - Aortic valve: There was moderate regurgitation. - Mitral valve: There was moderate regurgitation. - Left atrium: The atrium was moderately dilated. - Right atrium: The atrium was moderately dilated.  TEE 07/17/17: Study Conclusions  - Left ventricle: The estimated ejection fraction was in the range   of 40% to 45%. Mild diffuse hypokinesis with no identifiable   regional variations. No evidence of thrombus. - Aortic valve: There was moderate perivalvular regurgitation. - Mitral valve: No evidence of vegetation. There was moderate to   severe regurgitation, with multiple jets. Diastolic regurgitation   was absent. - Left atrium: The atrium was moderately dilated. No evidence of   thrombus in the atrial cavity or appendage. No evidence of   thrombus in the atrial cavity or appendage. Emptying velocity was   reduced. - Right atrium: The atrium was moderately dilated. - Atrial septum: There was a medium-sized fenestrated patent   foramen ovale. A in the fossa ovalis region was present. There   was a moderate bidirectional shunt through a  patent foramen   ovale, in the baseline state. - Tricuspid valve: No evidence of vegetation. There was moderate   regurgitation. - Pulmonic valve: No evidence of vegetation.  Impressions:  - Successful cardioversion. No  cardiac source of emboli was   indentified.  Event monitor 07/25/17: Study Highlights   Atrial fibrillation Ventricular rates are frequently elevated Frequent nonsustained ventricular tachycardia as well as aberrantly conducted afib are note No sustained ventricular arrhythmias No prolonged pauses or AV block   Cardiac MRI 10/14/17: Cardiac MRI and thoracic MRA with and without contrast was performed on a 1.5 T MRI scanner to evaluate myocardial morphology, function, viability and assess pulmonary vein anatomy in a patient with hx of GERD, HTN, hypercholesterolemia, OSA on CPAP, CAD s/p CABG (LIMA to LAD, svg to D1, sequential svg to OM and distal LCx, and svg to PDA), atrial flutter s/p ablation, atrial fibrillation, and wide complex tachycardia thought to due to SVT with aberrancy.Transthoracic echo demonstrated an LVEF of 40-45%, normal cavity size, moderate MR, and moderate AR.Transesophageal echo demonstrated moderate aortic regurgitation, moderate-severe mitral regurgitation, and a PFO.Nuclear stress demonstrated a mildly dilated LV with no perfusion defect and an EF of 44%.The patient is scheduled for ablation for atrial fibrillation.  Cardiac MRI 1. The left ventricle is moderately dilated.Wall thickness is normal.Regional and global LV systolic function are low normal.The LVEF is calculated at 59%.  2. The right ventricle is normal in cavity size and wall thickness.Global RV systolic function is low normal to mildly reduced.The RVEF is calculated at 50%.  3. Both atria are severely enlarged.  4. The aortic valve is trileaflet in morphology. There is no significant aortic stenosis. There is moderate-severe aortic regurgitation.The  regurgitant orifice area measures 0.26 cm^2.There is reversal of flow in the proximal descending thoracic aorta.  5.There ismild-moderate mitral regurgitation.There is moderate tricuspid regurgitation.There is mild pulmonic regurgitation.  6. Delayed enhancement imaging demonstrates no evidence of myocardial infarction, scarring,or infiltration.  7.There is no evidence of an intracardiac thrombus.  8.The pericardium is normal in thickness.There is no significant pericardial effusion.  Thoracic MRA 1. There are 3 right sided pulmonary veins.The right lower and right middle pulmonary veins enter the LA immediately adjacent to one another. The right middle vein is small.  The 2 left sided pulmonary veins enter the LA normally.  All veins are patent without significant stenosis proximally.   MRA bi-orthogonal luminal dimensions are listed below: RUPV: 2.0 x 1.8 cm, 76 cm/sec RMPV: 0.6 x 0.5 cm RLPV: 2.0 x 1.7 cm, 60 cm/ sec LUPV: 1.9 x 1.0 cm, 59 cm/ sec LLPV: 1.9 x 0.9 cm, 60 cm/ sec  MRA LA dimensions Head-foot: 7.1 cm Right-left:6.2 cm Anterior-posterior:4.3 cm  2. The thoracic aorta is normal in diameter. There is no evidence of a dissection flap.  3. The main and proximal branch pulmonary arteries are normal in size  4. Systemic venous connections are normal.  5. The esophagus is posterior of the left atrium in close proximity to the ostia of the left-sided pulmonary veins.The descending thoracic aorta is behind the esophagus and behind the left sided pulmonary Veins.  Echo 10/16/17: INTERPRETATION --------------------------------------------------------------- NORMAL LEFT VENTRICULAR SYSTOLIC FUNCTION WITH MILD LVH NORMAL LA PRESSURES WITH DIASTOLIC DYSFUNCTION NORMAL RIGHT VENTRICULAR SYSTOLIC FUNCTION VALVULAR REGURGITATION: MODERATE AR, MILD MR, TRIVIAL PR, MILD TR NO VALVULAR STENOSIS IRREGULAR RHYTHM Moderate to severe  aortic regurgitation. Mild left ventricular enlargement suggests color Doppler may underestimate aortic regurgitation severity. AR VENA CONTRACTA=0.5CM MILD TO MODERATE TR NO PRIOR STUDY FOR COMPARISON  Echo 05/02/18: Study Conclusions  - Left ventricle: The cavity size was mildly dilated. Wall   thickness was increased in a pattern of mild LVH. Systolic   function was  normal. The estimated ejection fraction was in the   range of 50% to 55%. Wall motion was normal; there were no   regional wall motion abnormalities. Left ventricular diastolic   function parameters were normal. - Aortic valve: There was mild to moderate regurgitation. - Mitral valve: Calcified annulus. There was mild regurgitation. - Left atrium: The atrium was severely dilated. - Tricuspid valve: There was moderate regurgitation. - Pulmonary arteries: Systolic pressure was mildly increased. PA   peak pressure: 41 mm Hg (S).  Impressions:  - Normal LV systolic function; mild LVE and LVH; mild to moderate   AI; mild MR; severe LAE; moderate TR; mild pulmonary   hypertension.   ASSESSMENT AND PLAN:  1. Atrial fibriillation with RVR. Prior history of atrial flutter and atrial tachycardia ablation in 2015. On anticoagulation with Eliquis 5 mg bid. [place] vasc score of 3. On higher Toprol XL dose. S/p TEE guided DCCV but remained in normal rhythm for only about a week. Failed Tikosyn due to QT prolongation. Now s/p Afib ablation at [facility] on 10/15/17. Maintaining NSR. Has PACs. Recommend long term anticoagulation.   2. Coronary disease status post CABG in 2005. He is without chest pain. He had a normal Myoview study September 2015. Low risk ETT in August 2018. Repeat nuclear  study at [facility] showed no ischemia. EF 44%. Repeat EF in October 2019 50-55%. We will continue risk factor modification.   3. Chronic Aortic insufficiency. By Echo and MRI this is in the moderate to severe range. EF has improved with restoration  of NSR. No stigmata of chronic AI. Pulse pressure normal. Murmur is minimal.  Recommend repeat Echo yearly to follow.  4. 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   5. Atrial flutter s/p ablation.   6. OSA- now on CPAP. Followed by Dr. Radford Pax.  7. Hyperlipidemia on statin.  8. HTN controlled.  Will check CMET, CBC, and lipids today.    Disposition:   FU with me in 6 months  Signed, [name] Shankland Martinique, MD  07/29/2018 9:01 AM    [place]

## 2018-07-29 ENCOUNTER — Ambulatory Visit: Payer: Medicare HMO | Admitting: Cardiology

## 2018-07-29 ENCOUNTER — Encounter: Payer: Self-pay | Admitting: Cardiology

## 2018-07-29 VITALS — BP 132/70 | HR 59 | Ht 73.0 in | Wt 204.6 lb

## 2018-07-29 DIAGNOSIS — Z951 Presence of aortocoronary bypass graft: Secondary | ICD-10-CM | POA: Diagnosis not present

## 2018-07-29 DIAGNOSIS — I4892 Unspecified atrial flutter: Secondary | ICD-10-CM

## 2018-07-29 DIAGNOSIS — I10 Essential (primary) hypertension: Secondary | ICD-10-CM

## 2018-07-29 DIAGNOSIS — I2510 Atherosclerotic heart disease of native coronary artery without angina pectoris: Secondary | ICD-10-CM

## 2018-07-29 DIAGNOSIS — I4891 Unspecified atrial fibrillation: Secondary | ICD-10-CM

## 2018-07-29 DIAGNOSIS — I351 Nonrheumatic aortic (valve) insufficiency: Secondary | ICD-10-CM

## 2018-07-29 LAB — BASIC METABOLIC PANEL
BUN/Creatinine Ratio: 13 (ref 10–24)
BUN: 13 mg/dL (ref 8–27)
CO2: 25 mmol/L (ref 20–29)
Calcium: 10 mg/dL (ref 8.6–10.2)
Chloride: 100 mmol/L (ref 96–106)
Creatinine, Ser: 1.02 mg/dL (ref 0.76–1.27)
GFR calc Af Amer: 86 mL/min/{1.73_m2} (ref >59)
GFR, EST NON AFRICAN AMERICAN: 75 mL/min/{1.73_m2} (ref >59)
Glucose: 96 mg/dL (ref 65–99)
Potassium: 4.5 mmol/L (ref 3.5–5.2)
SODIUM: 140 mmol/L (ref 134–144)

## 2018-07-29 LAB — CBC WITH DIFFERENTIAL/PLATELET
Basophils Absolute: 0.1 10*3/uL (ref 0.0–0.2)
Basos: 1 %
EOS (ABSOLUTE): 0.1 10*3/uL (ref 0.0–0.4)
Eos: 2 %
Hematocrit: 43.9 % (ref 37.5–51.0)
Hemoglobin: 15.6 g/dL (ref 13.0–17.7)
Immature Grans (Abs): 0 10*3/uL (ref 0.0–0.1)
Immature Granulocytes: 0 %
LYMPHS ABS: 1.6 10*3/uL (ref 0.7–3.1)
LYMPHS: 29 %
MCH: 32.1 pg (ref 26.6–33.0)
MCHC: 35.5 g/dL (ref 31.5–35.7)
MCV: 90 fL (ref 79–97)
MONOCYTES: 12 %
Monocytes Absolute: 0.6 10*3/uL (ref 0.1–0.9)
NEUTROS PCT: 56 %
Neutrophils Absolute: 3 10*3/uL (ref 1.4–7.0)
Platelets: 157 10*3/uL (ref 150–450)
RBC: 4.86 x10E6/uL (ref 4.14–5.80)
RDW: 12.2 % (ref 11.6–15.4)
WBC: 5.4 10*3/uL (ref 3.4–10.8)

## 2018-07-29 LAB — HEPATIC FUNCTION PANEL
ALT: 21 IU/L (ref 0–44)
AST: 23 IU/L (ref 0–40)
Albumin: 4.7 g/dL (ref 3.6–4.8)
Alkaline Phosphatase: 84 IU/L (ref 39–117)
Bilirubin Total: 1.1 mg/dL (ref 0.0–1.2)
Bilirubin, Direct: 0.32 mg/dL (ref 0.00–0.40)
Total Protein: 6.8 g/dL (ref 6.0–8.5)

## 2018-07-29 LAB — LIPID PANEL
CHOL/HDL RATIO: 2.3 ratio (ref 0.0–5.0)
Cholesterol, Total: 172 mg/dL (ref 100–199)
HDL: 74 mg/dL (ref >39)
LDL CALC: 85 mg/dL (ref 0–99)
Triglycerides: 63 mg/dL (ref 0–149)
VLDL CHOLESTEROL CAL: 13 mg/dL (ref 5–40)

## 2018-08-12 ENCOUNTER — Telehealth: Payer: Self-pay | Admitting: Cardiology

## 2018-08-12 MED ORDER — APIXABAN 5 MG PO TABS: 5.0000 mg | ORAL_TABLET | Freq: Two times a day (BID) | ORAL | 3 refills | Status: DC

## 2018-08-12 NOTE — Telephone Encounter (Signed)
New Message    *STAT* If patient is at the pharmacy, call can be transferred to refill team.   1. Which medications need to be refilled? (please list name of each medication and dose if known) Eliquis 5mg    2. Which pharmacy/location (including street and city if local pharmacy) is medication to be sent to? [place] on [address]  3. Do they need a 30 day or 90 day supply? 30 day

## 2018-09-26 ENCOUNTER — Ambulatory Visit: Payer: PPO | Admitting: Cardiology

## 2018-09-26 ENCOUNTER — Encounter: Payer: Self-pay | Admitting: Cardiology

## 2018-09-26 VITALS — BP 118/62 | HR 72 | Ht 73.0 in | Wt 212.4 lb

## 2018-09-26 DIAGNOSIS — I4819 Other persistent atrial fibrillation: Secondary | ICD-10-CM | POA: Diagnosis not present

## 2018-09-26 DIAGNOSIS — I10 Essential (primary) hypertension: Secondary | ICD-10-CM | POA: Diagnosis not present

## 2018-09-26 DIAGNOSIS — G4733 Obstructive sleep apnea (adult) (pediatric): Secondary | ICD-10-CM

## 2018-09-26 NOTE — Progress Notes (Signed)
Cardiology Office Note:    Date:  09/26/2018   ID:  [name] [name], DOB 09-03-1950, MRN [id number]  PCP:  [name], MD  Cardiologist:  No primary care provider on file.    Referring MD: [name], MD   Chief Complaint  Patient presents with  . Sleep Apnea  . Hypertension  . Atrial Fibrillation    History of Present Illness:    [name] [name] is a 68 y.o. male with a hx of moderate OSA with an AHI of 16.9 events per hour mainly occurring in the non supine position and during NREM sleep. He is on CPAP at 10cm H2O.  He is doing well with his CPAP device and thinks that he has gotten used to it.  He tolerates the mask and feels the pressure is adequate.  Since going on CPAP he feels rested in the am and has no significant daytime sleepiness.  He denies any significant mouth or nasal dryness or nasal congestion.  He does not think that he snores.     Past Medical History:  Diagnosis Date  . Chicken pox   . Coronary artery disease    Severe three-vessel coronary artery disease with ejection fraction of 65%  . GERD (gastroesophageal reflux disease)   . History of gout   . History of psoriasis   . Hypercholesterolemia   . Hypertension   . OSA (obstructive sleep apnea)    Moderate with AHI of 16.9/hr now on CPAP at 10cm H2O  . Psoriatic arthritis [facility])     Past Surgical History:  Procedure Laterality Date  . ATRIAL FLUTTER ABLATION  03/30/2014  . ATRIAL FLUTTER ABLATION N/A 03/30/2014   Procedure: ATRIAL FLUTTER ABLATION;  Surgeon: Coralyn Mark, MD;  Location: [facility] CATH LAB;  Service: Cardiovascular;  Laterality: N/A;  . CARDIAC CATHETERIZATION  09/2003   Ejection fraction was estimated at 65%.  . CARDIOVERSION N/A 03/15/2014   Procedure: CARDIOVERSION;  Surgeon: Lelon Perla, MD;  Location: [facility] ENDOSCOPY;  Service: Cardiovascular;  Laterality: N/A;  . CARDIOVERSION N/A 07/17/2017   Procedure: CARDIOVERSION;  Surgeon: Sueanne Margarita, MD;  Location: [place];  Service: Cardiovascular;  Laterality: N/A;  . CORONARY ARTERY BYPASS GRAFT  09/2003   Lima-lad,svg-diag,svg-om/distal LCX,svg-pda  . TEE WITHOUT CARDIOVERSION N/A 03/15/2014   Procedure: TRANSESOPHAGEAL ECHOCARDIOGRAM (TEE);  Surgeon: Lelon Perla, MD;  Location: [facility] ENDOSCOPY;  Service: Cardiovascular;  Laterality: N/A;  . TEE WITHOUT CARDIOVERSION N/A 07/17/2017   Procedure: TRANSESOPHAGEAL ECHOCARDIOGRAM (TEE);  Surgeon: Sueanne Margarita, MD;  Location: [facility] ENDOSCOPY;  Service: Cardiovascular;  Laterality: N/A;  . TONSILLECTOMY  1950's    Current Medications: Current Meds  Medication Sig  . apixaban (ELIQUIS) 5 MG TABS tablet Take 1 tablet (5 mg total) by mouth 2 (two) times daily.  Marland Kitchen atorvastatin (LIPITOR) 40 MG tablet TAKE 1 TABLET BY MOUTH DAILY.  . cholecalciferol (VITAMIN D) 1000 UNITS tablet Take 1,000 Units by mouth daily.    . Coenzyme Q10 (CO Q 10 PO) Take 100 mg by mouth daily.   . colchicine 0.6 MG tablet TAKE 1 TABLET BY MOUTH TWICE DAILY.  . furosemide (LASIX) 20 MG tablet Take 20 mg by mouth daily.  Marland Kitchen losartan (COZAAR) 25 MG tablet Take 1 tablet (25 mg total) by mouth daily.  . metoprolol succinate (TOPROL-XL) 25 MG 24 hr tablet TAKE 1 TABLET BY MOUTH TWICE DAILY.  . multivitamin (THERAGRAN) per tablet Take 1 tablet by mouth daily.    Marland Kitchen  nitroGLYCERIN (NITROSTAT) 0.4 MG SL tablet PLACE 1 TABLET UNDER THE TONGUE EVERY 5 MINUTES AS NEEDED FOR CHEST PAIN.  Marland Kitchen potassium chloride SA (K-DUR,KLOR-CON) 20 MEQ tablet Take 1 tablet (20 mEq total) by mouth daily.  . tadalafil (ADCIRCA/CIALIS) 20 MG tablet Take 1 tablet (20 mg total) by mouth every other day. (Patient taking differently: Take 20 mg by mouth every other day. Take one every other day as needed.)     Allergies:   Patient has no known allergies.   Social History   Socioeconomic History  . Marital status: Married    Spouse name: Not on file  . Number of children: 1  . Years of education: Not on file  .  Highest education level: Not on file  Occupational History  . Occupation: Print production planner: [employer]  . Financial resource strain: Not on file  . Food insecurity:    Worry: Not on file    Inability: Not on file  . Transportation needs:    Medical: Not on file    Non-medical: Not on file  Tobacco Use  . Smoking status: Former Smoker    Packs/day: 0.50    Years: 5.00    Pack years: 2.50    Types: Cigarettes  . Smokeless tobacco: Never Used  . Tobacco comment: "quit smoking cigarettes in the late 1970's"  Substance and Sexual Activity  . Alcohol use: Yes    Alcohol/week: 4.0 standard drinks    Types: 2 Cans of beer, 2 Shots of liquor per week    Comment: 2 to 3 drinks a wake   . Drug use: No  . Sexual activity: Yes  Lifestyle  . Physical activity:    Days per week: Not on file    Minutes per session: Not on file  . Stress: Not on file  Relationships  . Social connections:    Talks on phone: Not on file    Gets together: Not on file    Attends religious service: Not on file    Active member of club or organization: Not on file    Attends meetings of clubs or organizations: Not on file    Relationship status: Not on file  Other Topics Concern  . Not on file  Social History Narrative  . Not on file     Family History: The patient's family history includes Cancer in his mother; Heart disease in his mother; Stroke in his mother.  ROS:   Please see the history of present illness.    ROS  All other systems reviewed and negative.   EKGs/Labs/Other Studies Reviewed:    The following studies were reviewed today: PAP download  EKG:  EKG is ordered today and showed NSR with PACs   Recent Labs: 07/29/2018: ALT 21; BUN 13; Creatinine, Ser 1.02; Hemoglobin 15.6; Platelets 157; Potassium 4.5; Sodium 140   Recent Lipid Panel    Component Value Date/Time   CHOL 172 07/29/2018 0921   TRIG 63 07/29/2018 0921   HDL 74 07/29/2018 0921     CHOLHDL 2.3 07/29/2018 0921   CHOLHDL 3 11/23/2015 0837   VLDL 16.4 11/23/2015 0837   LDLCALC 85 07/29/2018 0921    Physical Exam:    VS:  BP 118/62   Pulse 72   Ht 6\' 1"  (1.854 m)   Wt 212 lb 6.4 oz (96.3 kg)   SpO2 98%   BMI 28.02 kg/m     Wt Readings from  Last 3 Encounters:  09/26/18 212 lb 6.4 oz (96.3 kg)  07/29/18 204 lb 9.6 oz (92.8 kg)  05/21/18 201 lb 14.4 oz (91.6 kg)     GEN:  Well nourished, well developed in no acute distress HEENT: Normal NECK: No JVD; No carotid bruits LYMPHATICS: No lymphadenopathy CARDIAC: RRR, no murmurs, rubs, gallops RESPIRATORY:  Clear to auscultation without rales, wheezing or rhonchi  ABDOMEN: Soft, non-tender, non-distended MUSCULOSKELETAL:  No edema; No deformity  SKIN: Warm and dry NEUROLOGIC:  Alert and oriented x 3 PSYCHIATRIC:  Normal affect   ASSESSMENT:    1. OSA (obstructive sleep apnea)   2. Essential hypertension   3. Persistent atrial fibrillation    PLAN:    In order of problems listed above:  1.  OSA - the patient is tolerating PAP therapy well without any problems. The PAP download was reviewed today and showed an AHI of 1.3/hr on 10 cm H2O with 97% compliance in using more than 4 hours nightly.  The patient has been using and benefiting from PAP use and will continue to benefit from therapy.   2.  HTN -  BP is controlled on exam today.  He will continue on losartan 25 mg daily and Toprol-XL 25 mg daily.  3.  Persistent atrial fibrilation - His HR is irregular today but EKG shows normal sinus rhythm with frequent PACs.Marland Kitchen  He will continue on Toprol and Apixaban.       Medication Adjustments/Labs and Tests Ordered: Current medicines are reviewed at length with the patient today.  Concerns regarding medicines are outlined above.  No orders of the defined types were placed in this encounter.  No orders of the defined types were placed in this encounter.   Signed, Fransico Him, MD  09/26/2018 8:53 AM     [place]

## 2018-09-26 NOTE — Addendum Note (Signed)
Addended by: Gar Ponto on: 09/26/2018 09:11 AM   Modules accepted: Orders

## 2018-09-26 NOTE — Patient Instructions (Signed)

## 2018-11-28 ENCOUNTER — Other Ambulatory Visit: Payer: Self-pay | Admitting: Cardiology

## 2018-11-28 NOTE — Telephone Encounter (Signed)
This is Dr. Jordan's pt. °

## 2018-11-28 NOTE — Telephone Encounter (Signed)
Metoprolol Succ 25 mg refilled

## 2018-12-04 ENCOUNTER — Other Ambulatory Visit: Payer: Self-pay | Admitting: Family Medicine

## 2018-12-11 ENCOUNTER — Other Ambulatory Visit: Payer: Self-pay | Admitting: Cardiology

## 2019-01-16 ENCOUNTER — Other Ambulatory Visit: Payer: Self-pay | Admitting: Family Medicine

## 2019-02-12 ENCOUNTER — Other Ambulatory Visit: Payer: Self-pay | Admitting: Cardiology

## 2019-02-13 ENCOUNTER — Other Ambulatory Visit: Payer: Self-pay | Admitting: Cardiology

## 2019-02-17 ENCOUNTER — Other Ambulatory Visit: Payer: Self-pay | Admitting: Cardiology

## 2019-02-19 ENCOUNTER — Other Ambulatory Visit: Payer: Self-pay

## 2019-02-19 MED ORDER — METOPROLOL SUCCINATE ER 25 MG PO TB24: 25.0000 mg | ORAL_TABLET | Freq: Two times a day (BID) | ORAL | 1 refills | Status: DC

## 2019-02-21 ENCOUNTER — Other Ambulatory Visit: Payer: Self-pay

## 2019-02-21 ENCOUNTER — Telehealth (INDEPENDENT_AMBULATORY_CARE_PROVIDER_SITE_OTHER): Payer: Medicare HMO | Admitting: Family Medicine

## 2019-02-21 DIAGNOSIS — R490 Dysphonia: Secondary | ICD-10-CM | POA: Diagnosis not present

## 2019-02-21 DIAGNOSIS — R6882 Decreased libido: Secondary | ICD-10-CM | POA: Diagnosis not present

## 2019-02-21 MED ORDER — OMEPRAZOLE 20 MG PO CPDR: 20.0000 mg | DELAYED_RELEASE_CAPSULE | Freq: Two times a day (BID) | ORAL | 0 refills | Status: DC

## 2019-02-21 NOTE — Progress Notes (Signed)
Virtual Visit via Video Note  I connected with the patient on 02/21/19 at  9:20 AM EDT by a video enabled telemedicine application and verified that I am speaking with the correct person using two identifiers.  Location patient: home Location provider:work or home office Persons participating in the virtual visit: patient, provider  I discussed the limitations of evaluation and management by telemedicine and the availability of in person appointments. The patient expressed understanding and agreed to proceed.   HPI: He has several issues to discuss. First for the past month he has had hoarseness, which is unusual for him. This is constant. No PND or ST or cough. He rarely has heartburn. He smoked a little many years ago but quit. The second issue is a loss of libido. This has been building up for several years. Cialis helps with erections, but he has very little desire for sex. He has had recent lab work showing normal CBC, liver functions, and BMET.    ROS: See pertinent positives and negatives per HPI.  Past Medical History:  Diagnosis Date  . Chicken pox   . Coronary artery disease    Severe three-vessel coronary artery disease with ejection fraction of 65%  . GERD (gastroesophageal reflux disease)   . History of gout   . History of psoriasis   . Hypercholesterolemia   . Hypertension   . OSA (obstructive sleep apnea)    Moderate with AHI of 16.9/hr now on CPAP at 10cm H2O  . Psoriatic arthritis [facility])     Past Surgical History:  Procedure Laterality Date  . ATRIAL FLUTTER ABLATION  03/30/2014  . ATRIAL FLUTTER ABLATION N/A 03/30/2014   Procedure: ATRIAL FLUTTER ABLATION;  Surgeon: Coralyn Mark, MD;  Location: [facility] CATH LAB;  Service: Cardiovascular;  Laterality: N/A;  . CARDIAC CATHETERIZATION  09/2003   Ejection fraction was estimated at 65%.  . CARDIOVERSION N/A 03/15/2014   Procedure: CARDIOVERSION;  Surgeon: Lelon Perla, MD;  Location: [facility] ENDOSCOPY;  Service:  Cardiovascular;  Laterality: N/A;  . CARDIOVERSION N/A 07/17/2017   Procedure: CARDIOVERSION;  Surgeon: Sueanne Margarita, MD;  Location: [place];  Service: Cardiovascular;  Laterality: N/A;  . CORONARY ARTERY BYPASS GRAFT  09/2003   Lima-lad,svg-diag,svg-om/distal LCX,svg-pda  . TEE WITHOUT CARDIOVERSION N/A 03/15/2014   Procedure: TRANSESOPHAGEAL ECHOCARDIOGRAM (TEE);  Surgeon: Lelon Perla, MD;  Location: [facility] ENDOSCOPY;  Service: Cardiovascular;  Laterality: N/A;  . TEE WITHOUT CARDIOVERSION N/A 07/17/2017   Procedure: TRANSESOPHAGEAL ECHOCARDIOGRAM (TEE);  Surgeon: Sueanne Margarita, MD;  Location: [facility] ENDOSCOPY;  Service: Cardiovascular;  Laterality: N/A;  . TONSILLECTOMY  1950's    Family History  Problem Relation Age of Onset  . Heart disease Mother        cabg  . Cancer Mother        breast  . Stroke Mother      Current Outpatient Medications:  .  atorvastatin (LIPITOR) 40 MG tablet, TAKE 1 TABLET BY MOUTH EVERY DAY, Disp: 30 tablet, Rfl: 4 .  cholecalciferol (VITAMIN D) 1000 UNITS tablet, Take 1,000 Units by mouth daily.  , Disp: , Rfl:  .  Coenzyme Q10 (CO Q 10 PO), Take 100 mg by mouth daily. , Disp: , Rfl:  .  colchicine 0.6 MG tablet, TAKE 1 TABLET BY MOUTH TWICE A DAY, Disp: 60 tablet, Rfl: 0 .  ELIQUIS 5 MG TABS tablet, TAKE 1 TABLET BY MOUTH TWICE A DAY, Disp: 180 tablet, Rfl: 1 .  furosemide (LASIX) 20 MG tablet,  Take 20 mg by mouth daily., Disp: , Rfl:  .  losartan (COZAAR) 25 MG tablet, Take 1 tablet (25 mg total) by mouth daily. OFFICE VISIT NEEDED, Disp: 45 tablet, Rfl: 0 .  metoprolol succinate (TOPROL-XL) 25 MG 24 hr tablet, Take 1 tablet (25 mg total) by mouth 2 (two) times daily., Disp: 60 tablet, Rfl: 1 .  multivitamin (THERAGRAN) per tablet, Take 1 tablet by mouth daily.  , Disp: , Rfl:  .  nitroGLYCERIN (NITROSTAT) 0.4 MG SL tablet, PLACE 1 TABLET UNDER THE TONGUE EVERY 5 MINUTES AS NEEDED FOR CHEST PAIN., Disp: 25 tablet, Rfl: 5 .  potassium chloride SA  (K-DUR,KLOR-CON) 20 MEQ tablet, Take 1 tablet (20 mEq total) by mouth daily., Disp: 30 tablet, Rfl: 6 .  tadalafil (ADCIRCA/CIALIS) 20 MG tablet, Take 1 tablet (20 mg total) by mouth every other day. (Patient taking differently: Take 20 mg by mouth every other day. Take one every other day as needed.), Disp: 6 tablet, Rfl: 11  EXAM:  VITALS per patient if applicable:  GENERAL: alert, oriented, appears well and in no acute distress  HEENT: atraumatic, conjunttiva clear, no obvious abnormalities on inspection of external nose and ears  NECK: normal movements of the head and neck  LUNGS: on inspection no signs of respiratory distress, breathing rate appears normal, no obvious gross SOB, gasping or wheezing  CV: no obvious cyanosis  MS: moves all visible extremities without noticeable abnormality  PSYCH/NEURO: pleasant and cooperative, no obvious depression or anxiety, speech and thought processing grossly intact  ASSESSMENT AND PLAN: His hoarseness may be due to silent reflux, so he will try Prilosec 20 mg bid for 2 weeks. If this does not work he will follow up with Dr. Elease Hashimoto. Also for the loss of libido, we will check a testosterone level and a TSH. Alysia Penna, MD Discussed the following assessment and plan:  No diagnosis found.     I discussed the assessment and treatment plan with the patient. The patient was provided an opportunity to ask questions and all were answered. The patient agreed with the plan and demonstrated an understanding of the instructions.   The patient was advised to call back or seek an in-person evaluation if the symptoms worsen or if the condition fails to improve as anticipated.

## 2019-02-24 ENCOUNTER — Other Ambulatory Visit (INDEPENDENT_AMBULATORY_CARE_PROVIDER_SITE_OTHER): Payer: Medicare HMO

## 2019-02-24 ENCOUNTER — Other Ambulatory Visit: Payer: Self-pay

## 2019-02-24 DIAGNOSIS — R6882 Decreased libido: Secondary | ICD-10-CM | POA: Diagnosis not present

## 2019-02-24 DIAGNOSIS — Z1159 Encounter for screening for other viral diseases: Secondary | ICD-10-CM

## 2019-02-24 LAB — TESTOSTERONE: Testosterone: 391.11 ng/dL (ref 300.00–890.00)

## 2019-02-24 LAB — TSH: TSH: 0.93 u[IU]/mL (ref 0.35–4.50)

## 2019-02-25 LAB — HEPATITIS C ANTIBODY
Hepatitis C Ab: NONREACTIVE
SIGNAL TO CUT-OFF: 0.02 (ref <1.00)

## 2019-04-06 ENCOUNTER — Ambulatory Visit (INDEPENDENT_AMBULATORY_CARE_PROVIDER_SITE_OTHER): Payer: Medicare HMO | Admitting: Family Medicine

## 2019-04-06 ENCOUNTER — Other Ambulatory Visit: Payer: Self-pay

## 2019-04-06 ENCOUNTER — Telehealth: Payer: Self-pay

## 2019-04-06 DIAGNOSIS — R490 Dysphonia: Secondary | ICD-10-CM | POA: Diagnosis not present

## 2019-04-06 DIAGNOSIS — R6882 Decreased libido: Secondary | ICD-10-CM

## 2019-04-06 MED ORDER — AZELASTINE HCL 0.1 % NA SOLN: 1.0000 | Freq: Two times a day (BID) | NASAL | 12 refills | Status: DC

## 2019-04-06 NOTE — Progress Notes (Signed)
Patient ID: [name] [name], male   DOB: [birth year], 68 y.o.   MRN: [id number]  This visit type was conducted due to national recommendations for restrictions regarding the COVID-19 pandemic in an effort to limit this patient's exposure and mitigate transmission in our community.   Virtual Visit via Telephone Note  I connected with [name] on 04/06/19 at  4:00 PM EDT by telephone and verified that I am speaking with the correct person using two identifiers.   I discussed the limitations, risks, security and privacy concerns of performing an evaluation and management service by telephone and the availability of in person appointments. I also discussed with the patient that there may be a patient responsible charge related to this service. The patient expressed understanding and agreed to proceed.  Location patient: home Location provider: work or home office Participants present for the call: patient, provider Patient did not have a visit in the prior 7 days to address this/these issue(s).   History of Present Illness: Patient has had persistent raspy voice now for over a month.  He had consulted with another provider on 1 August and there was concern for possible reflux related symptoms.  Took Prilosec 20 mg twice daily for a couple weeks without any improvement.  He denies any persistent hoarseness.  He states he has somewhat of a "raspy voice" that comes and goes.  He is aware of some postnasal drip symptoms.  He has not had any active GERD symptoms.  No dysphagia.  No recent fevers or chills.  No cough.  No facial pain.  No purulent nasal secretions.  He also complained recently of low libido.  His testosterone level came back 391 with normal TSH.  Does not take any antidepressant medications.   Observations/Objective: Patient sounds cheerful and well on the phone. I do not appreciate any SOB. Speech and thought processing are grossly intact. Patient reported vitals:  Assessment  and Plan:  #1 raspy voice.  Unresponsive to PPI.  Question allergic postnasal drip related  -Consider trial of Astelin nasal 1 spray to each nostril twice daily -Touch base in 2 weeks if symptoms not improving -Consider possible ENT referral if not improving over the next couple weeks  #2 low libido.  Recent normal testosterone level  -Discussed factors that can contribute to low libido.  He is not a candidate for testosterone replacement with recent normal labs  Follow Up Instructions:  -As above   99441 5-10 [id number] 11-20 99443 21-30 I did not refer this patient for an OV in the next 24 hours for this/these issue(s).  I discussed the assessment and treatment plan with the patient. The patient was provided an opportunity to ask questions and all were answered. The patient agreed with the plan and demonstrated an understanding of the instructions.   The patient was advised to call back or seek an in-person evaluation if the symptoms worsen or if the condition fails to improve as anticipated.  I provided 23 minutes of non-face-to-face time during this encounter.   [name], MD

## 2019-04-06 NOTE — Telephone Encounter (Signed)
Copied from [place] [id number]. Topic: General - Other >> Apr 06, 2019 12:15 PM Mathis Bud wrote: Reason for CRM: Patient is calling to make sure virtual is set up correctly. It says it is virtual on appt notes but does not look like appt has been green lighted.  Just want to make sure. Did explain to patient how it works, and he would like it sent to cell phone number. [phone]

## 2019-04-06 NOTE — Telephone Encounter (Signed)
Spoke with patient, he is aware his appointment will be virtual. Nothing further needed.

## 2019-04-06 NOTE — Addendum Note (Signed)
Addended by: [name] on: 04/06/2019 05:28 PM   Modules accepted: Level of Service

## 2019-04-13 ENCOUNTER — Ambulatory Visit: Payer: PPO

## 2019-04-30 ENCOUNTER — Other Ambulatory Visit: Payer: Self-pay | Admitting: Family Medicine

## 2019-04-30 MED ORDER — TADALAFIL 20 MG PO TABS: 20.0000 mg | ORAL_TABLET | ORAL | 11 refills | Status: DC

## 2019-04-30 NOTE — Telephone Encounter (Signed)
.  Medication Refill - Medication: tadalafil (ADCIRCA/CIALIS) 20 MG tablet [id number]    Has the patient contacted their pharmacy? No. (Agent: If no, request that the patient contact the pharmacy for the refill.) (Agent: If yes, when and what did the pharmacy advise?)  Preferred Pharmacy (with phone number or street name): [facility] on [address] for just this med   Agent: Please be advised that RX refills may take up to 3 business days. We ask that you follow-up with your pharmacy.

## 2019-06-09 ENCOUNTER — Ambulatory Visit (INDEPENDENT_AMBULATORY_CARE_PROVIDER_SITE_OTHER): Payer: Medicare HMO

## 2019-06-09 ENCOUNTER — Other Ambulatory Visit: Payer: Self-pay

## 2019-06-09 DIAGNOSIS — Z23 Encounter for immunization: Secondary | ICD-10-CM

## 2019-06-11 ENCOUNTER — Other Ambulatory Visit: Payer: Self-pay | Admitting: Family Medicine

## 2019-06-17 ENCOUNTER — Other Ambulatory Visit: Payer: Self-pay

## 2019-06-17 DIAGNOSIS — Z20822 Contact with and (suspected) exposure to covid-19: Secondary | ICD-10-CM

## 2019-06-19 LAB — NOVEL CORONAVIRUS, NAA: SARS-CoV-2, NAA: NOT DETECTED

## 2019-06-25 ENCOUNTER — Other Ambulatory Visit: Payer: Self-pay | Admitting: Cardiology

## 2019-06-25 MED ORDER — LOSARTAN POTASSIUM 25 MG PO TABS: 25.0000 mg | ORAL_TABLET | Freq: Every day | ORAL | 0 refills | Status: DC

## 2019-06-25 NOTE — Telephone Encounter (Signed)
Requested Prescriptions   Signed Prescriptions Disp Refills  . losartan (COZAAR) 25 MG tablet 90 tablet 0    Sig: Take 1 tablet (25 mg total) by mouth daily. OFFICE VISIT NEEDED    Authorizing Provider: [place]ique, PETER M    Ordering User: Raelene Bott, BRANDY L

## 2019-06-25 NOTE — Telephone Encounter (Signed)
New Message      *STAT* If patient is at the pharmacy, call can be transferred to refill team.   1. Which medications need to be refilled? (please list name of each medication and dose if known)   losartan (COZAAR) 25 MG tablet     2. Which pharmacy/location (including street and city if local pharmacy) is medication to be sent to?[id number] [place], [place] [address] AT [address]  3. Do they need a 30 day or 90 day supply? [place]

## 2019-07-08 IMAGING — DX DG CHEST 2V
3 series · 3 of 3 positions shown · non-contrast
Comparison: Chest x-ray of 04/26/1999

CLINICAL DATA: History of bibasilar infiltrates, smoking history
follow-up

EXAM:
CHEST - 2 VIEW

[chest pa]
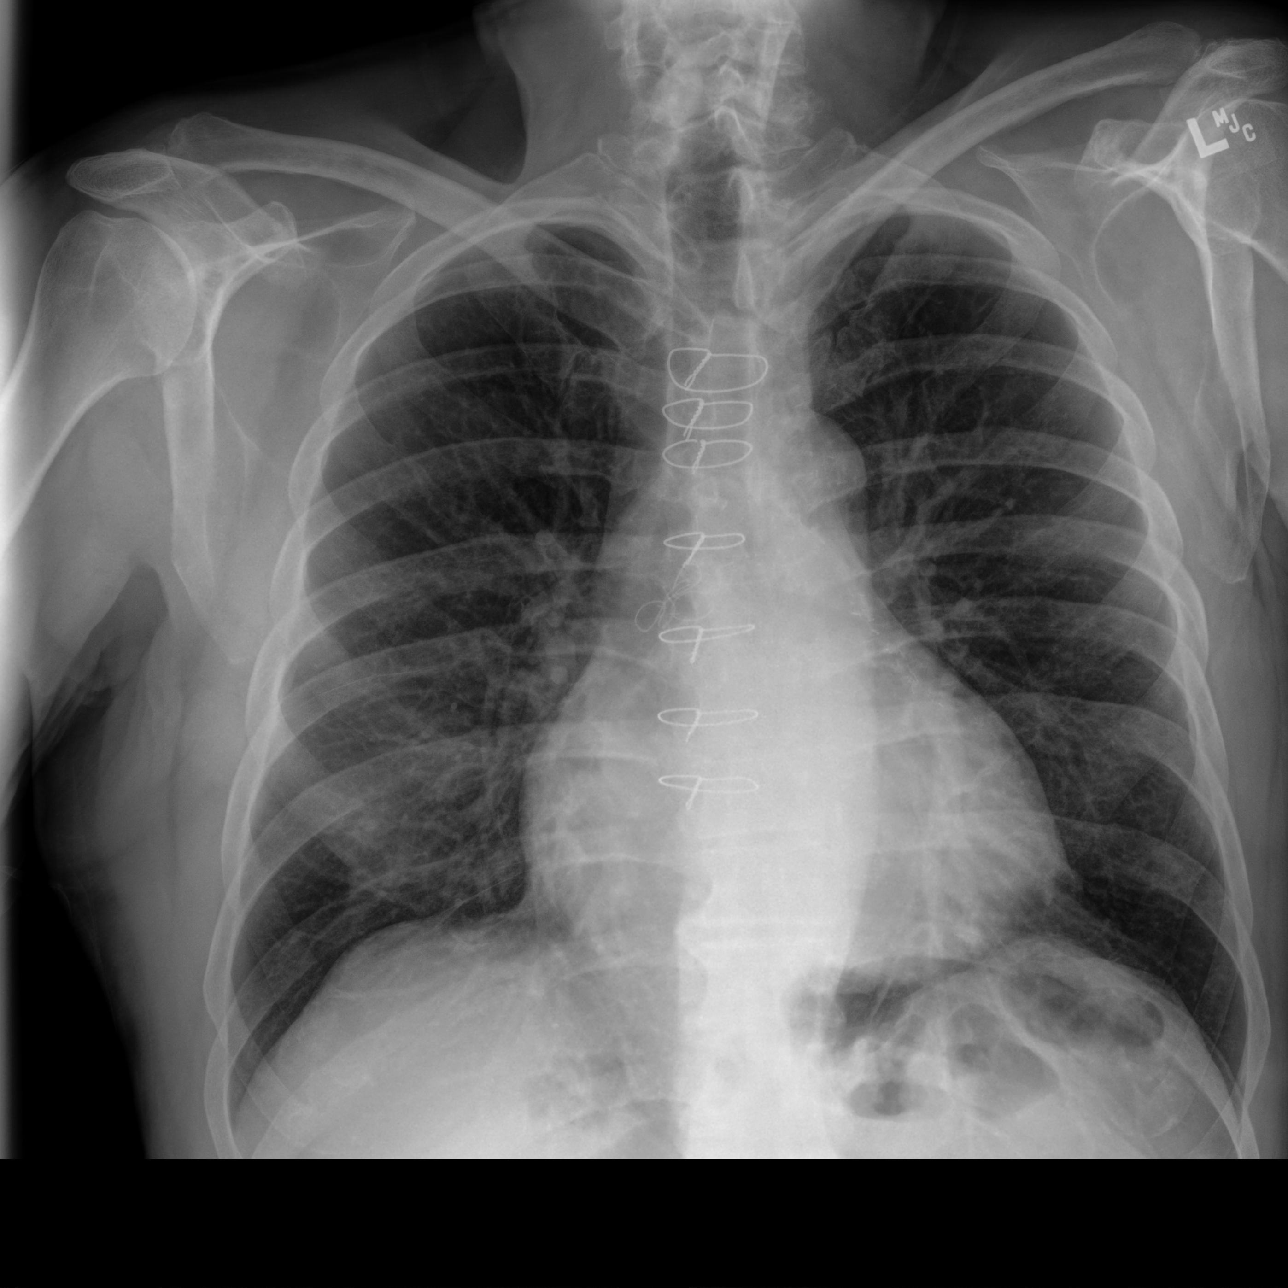

[chest lat (1 of 2)]
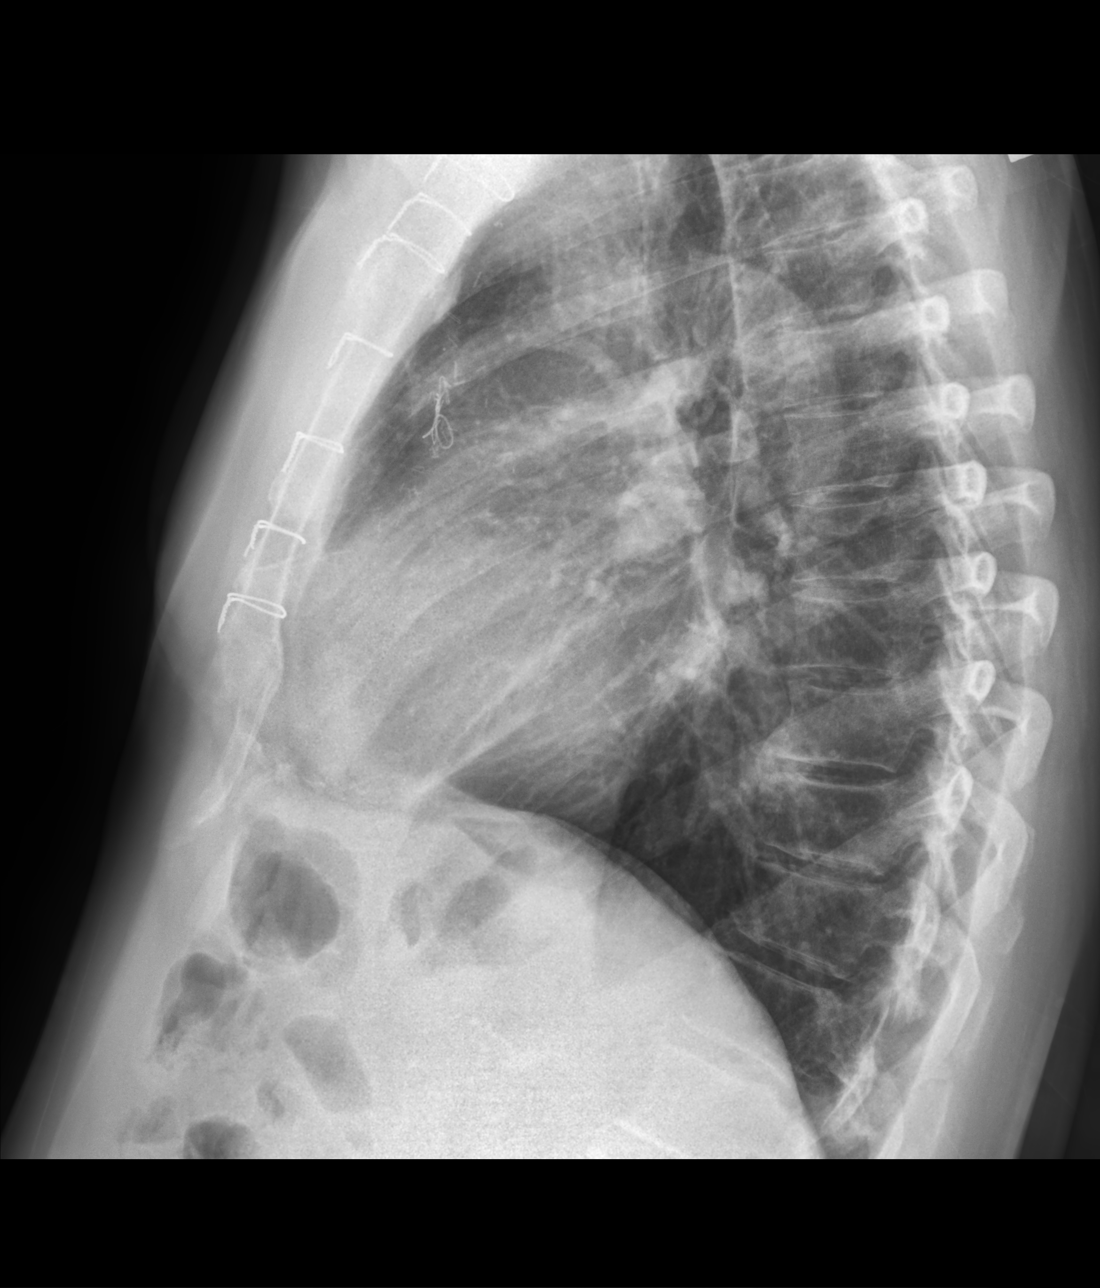

[chest lat (2 of 2)]
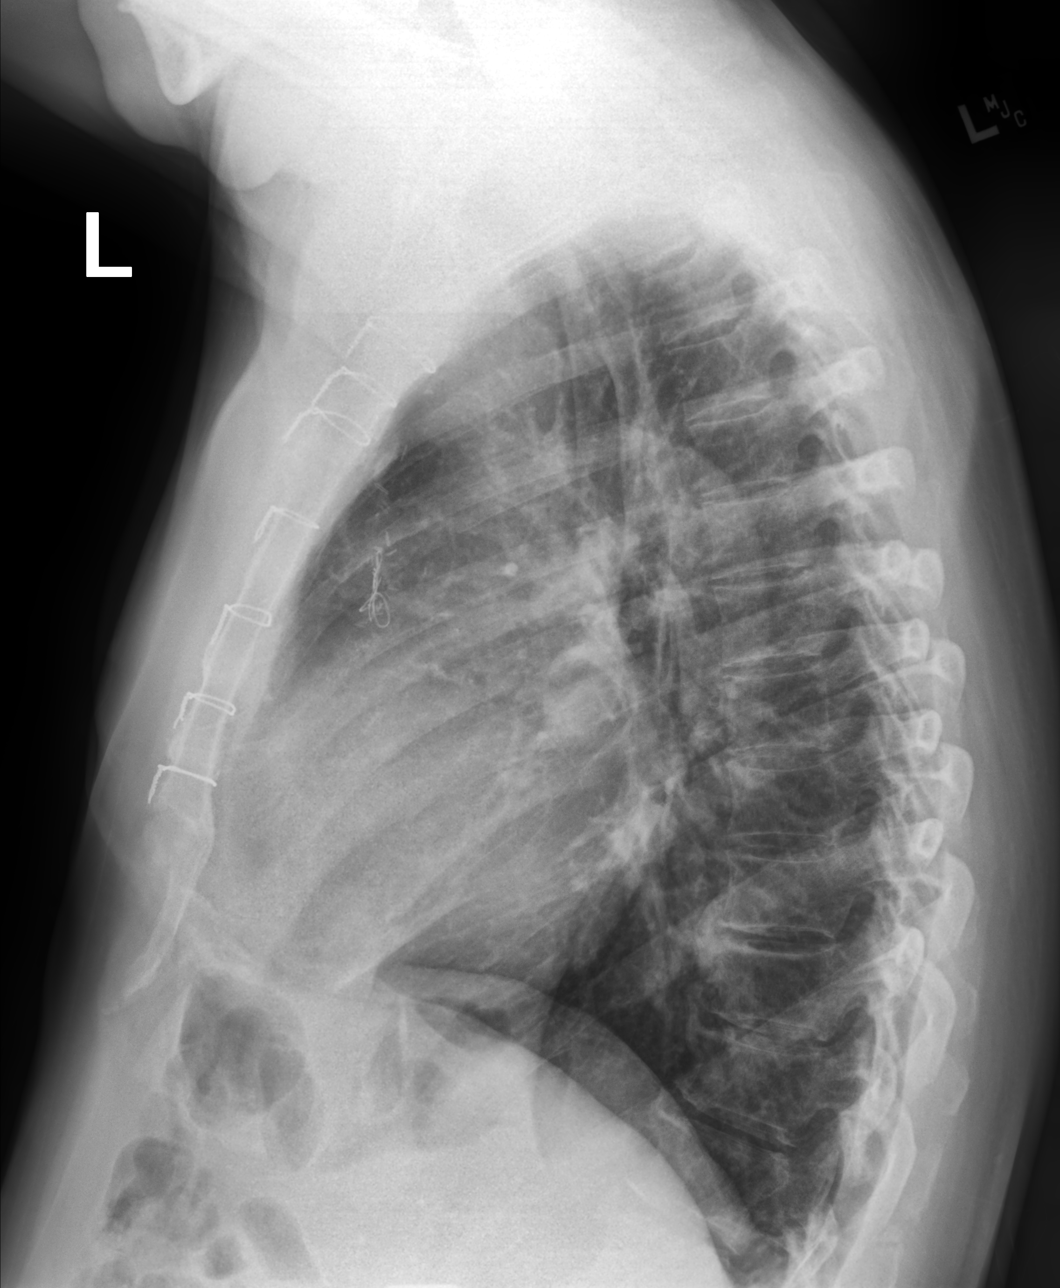

[3 of 3 positions shown; findings below may reference images not displayed]

FINDINGS: No infiltrate or effusion is seen. Mediastinal contours. There is
some peribronchial thickening in this patient with a smoking history
most likely indicating chronic bronchitis. Median sternotomy sutures
are noted from prior CABG and cardiomegaly is stable. No acute
abnormality is seen.
IMPRESSION: No active lung disease. Probable chronic bronchitis. Stable
cardiomegaly.

## 2019-07-12 ENCOUNTER — Other Ambulatory Visit: Payer: Self-pay | Admitting: Cardiology

## 2019-07-19 ENCOUNTER — Other Ambulatory Visit: Payer: Self-pay | Admitting: Family Medicine

## 2019-07-21 ENCOUNTER — Ambulatory Visit (INDEPENDENT_AMBULATORY_CARE_PROVIDER_SITE_OTHER): Payer: Medicare HMO

## 2019-07-21 VITALS — Ht 73.0 in | Wt 200.0 lb

## 2019-07-21 DIAGNOSIS — Z0000 Encounter for general adult medical examination without abnormal findings: Secondary | ICD-10-CM | POA: Diagnosis not present

## 2019-07-21 NOTE — Patient Instructions (Addendum)
[name] [name] , Thank you for taking time to participate in your Medicare Wellness Visit. I appreciate your ongoing commitment to your health goals. Please review the following plan we discussed and let me know if I can assist you in the future.   Screening recommendations/referrals: Colorectal Screening: completed colonoscopy in 2013; due again 2023.  Vision and Dental Exams: Recommended annual ophthalmology exams for early detection of glaucoma and other disorders of the eye Recommended annual dental exams for proper oral hygiene  Diabetic Exams: Diabetic Eye Exam: N/A Diabetic Foot Exam: N/A  Vaccinations: Influenza vaccine: performed 06/09/2019; due again in Fall 2021. Pneumococcal vaccine: performed 08/20/2013; currently up to date. Tdap vaccine: due now. Discussed need for updated Tdap and may obtain at pharmacy. Shingles vaccine: Please call your pharmacy to determine your out of pocket expense for the Shingrix vaccine. You may receive this vaccine at your local pharmacy.  Advanced directives: Advance directives discussed with you today.  Please bring a copy of your POA (Power of [place]) and/or Living Will to your next appointment.  Goals: Recommend to drink at least 6-8 8oz glasses of water per day. Continue to exercise for at least 150 minutes per week.  Recommend to decrease portion sizes by eating 3 small healthy meals and at least 2 healthy snacks per day.  Next appointment: Please schedule your Annual Wellness Visit with your Nurse Health Advisor in one year.  Preventive Care 70 Years and Older, Male Preventive care refers to lifestyle choices and visits with your health care provider that can promote health and wellness. What does preventive care include?  A yearly physical exam. This is also called an annual well check.  Dental exams once or twice a year.  Routine eye exams. Ask your health care provider how often you should have your eyes checked.  Personal  lifestyle choices, including:  Daily care of your teeth and gums.  Regular physical activity.  Eating a healthy diet.  Avoiding tobacco and drug use.  Limiting alcohol use.  Practicing safe sex.  Taking low doses of aspirin every day if recommended by your health care provider..  Taking vitamin and mineral supplements as recommended by your health care provider. What happens during an annual well check? The services and screenings done by your health care provider during your annual well check will depend on your age, overall health, lifestyle risk factors, and family history of disease. Counseling  Your health care provider may ask you questions about your:  Alcohol use.  Tobacco use.  Drug use.  Emotional well-being.  Home and relationship well-being.  Sexual activity.  Eating habits.  History of falls.  Memory and ability to understand (cognition).  Work and work Statistician. Screening  You may have the following tests or measurements:  Height, weight, and BMI.  Blood pressure.  Lipid and cholesterol levels. These may be checked every 5 years, or more frequently if you are over 70 years old.  Skin check.  Lung cancer screening. You may have this screening every year starting at age 70 if you have a 30-pack-year history of smoking and currently smoke or have quit within the past 15 years.  Fecal occult blood test (FOBT) of the stool. You may have this test every year starting at age 70.  Flexible sigmoidoscopy or colonoscopy. You may have a sigmoidoscopy every 5 years or a colonoscopy every 10 years starting at age 70.  Prostate cancer screening. Recommendations will vary depending on your family history and other risks.  Hepatitis C blood test.  Hepatitis B blood test.  Sexually transmitted disease (STD) testing.  Diabetes screening. This is done by checking your blood sugar (glucose) after you have not eaten for a while (fasting). You may have  this done every 1-3 years.  Abdominal aortic aneurysm (AAA) screening. You may need this if you are a current or former smoker.  Osteoporosis. You may be screened starting at age 30 if you are at high risk. Talk with your health care provider about your test results, treatment options, and if necessary, the need for more tests. Vaccines  Your health care provider may recommend certain vaccines, such as:  Influenza vaccine. This is recommended every year.  Tetanus, diphtheria, and acellular pertussis (Tdap, Td) vaccine. You may need a Td booster every 10 years.  Zoster vaccine. You may need this after age 70.  Pneumococcal 13-valent conjugate (PCV13) vaccine. One dose is recommended after age 23.  Pneumococcal polysaccharide (PPSV23) vaccine. One dose is recommended after age 25. Talk to your health care provider about which screenings and vaccines you need and how often you need them. This information is not intended to replace advice given to you by your health care provider. Make sure you discuss any questions you have with your health care provider. Document Released: 08/05/2015 Document Revised: 03/28/2016 Document Reviewed: 05/10/2015 Elsevier Interactive Patient Education  2017 [place] Prevention in the Home Falls can cause injuries. They can happen to people of all ages. There are many things you can do to make your home safe and to help prevent falls. What can I do on the outside of my home?  Regularly fix the edges of walkways and driveways and fix any cracks.  Remove anything that might make you trip as you walk through a door, such as a raised step or threshold.  Trim any bushes or trees on the path to your home.  Use bright outdoor lighting.  Clear any walking paths of anything that might make someone trip, such as rocks or tools.  Regularly check to see if handrails are loose or broken. Make sure that both sides of any steps have handrails.  Any raised  decks and porches should have guardrails on the edges.  Have any leaves, snow, or ice cleared regularly.  Use sand or salt on walking paths during winter.  Clean up any spills in your garage right away. This includes oil or grease spills. What can I do in the bathroom?  Use night lights.  Install grab bars by the toilet and in the tub and shower. Do not use towel bars as grab bars.  Use non-skid mats or decals in the tub or shower.  If you need to sit down in the shower, use a plastic, non-slip stool.  Keep the floor dry. Clean up any water that spills on the floor as soon as it happens.  Remove soap buildup in the tub or shower regularly.  Attach bath mats securely with double-sided non-slip rug tape.  Do not have throw rugs and other things on the floor that can make you trip. What can I do in the bedroom?  Use night lights.  Make sure that you have a light by your bed that is easy to reach.  Do not use any sheets or blankets that are too big for your bed. They should not hang down onto the floor.  Have a firm chair that has side arms. You can use this for support while you  get dressed.  Do not have throw rugs and other things on the floor that can make you trip. What can I do in the kitchen?  Clean up any spills right away.  Avoid walking on wet floors.  Keep items that you use a lot in easy-to-reach places.  If you need to reach something above you, use a strong step stool that has a grab bar.  Keep electrical cords out of the way.  Do not use floor polish or wax that makes floors slippery. If you must use wax, use non-skid floor wax.  Do not have throw rugs and other things on the floor that can make you trip. What can I do with my stairs?  Do not leave any items on the stairs.  Make sure that there are handrails on both sides of the stairs and use them. Fix handrails that are broken or loose. Make sure that handrails are as long as the stairways.  Check  any carpeting to make sure that it is firmly attached to the stairs. Fix any carpet that is loose or worn.  Avoid having throw rugs at the top or bottom of the stairs. If you do have throw rugs, attach them to the floor with carpet tape.  Make sure that you have a light switch at the top of the stairs and the bottom of the stairs. If you do not have them, ask someone to add them for you. What else can I do to help prevent falls?  Wear shoes that:  Do not have high heels.  Have rubber bottoms.  Are comfortable and fit you well.  Are closed at the toe. Do not wear sandals.  If you use a stepladder:  Make sure that it is fully opened. Do not climb a closed stepladder.  Make sure that both sides of the stepladder are locked into place.  Ask someone to hold it for you, if possible.  Clearly mark and make sure that you can see:  Any grab bars or handrails.  First and last steps.  Where the edge of each step is.  Use tools that help you move around (mobility aids) if they are needed. These include:  Canes.  Walkers.  Scooters.  Crutches.  Turn on the lights when you go into a dark area. Replace any light bulbs as soon as they burn out.  Set up your furniture so you have a clear path. Avoid moving your furniture around.  If any of your floors are uneven, fix them.  If there are any pets around you, be aware of where they are.  Review your medicines with your doctor. Some medicines can make you feel dizzy. This can increase your chance of falling. Ask your doctor what other things that you can do to help prevent falls. This information is not intended to replace advice given to you by your health care provider. Make sure you discuss any questions you have with your health care provider. Document Released: 05/05/2009 Document Revised: 12/15/2015 Document Reviewed: 08/13/2014 Elsevier Interactive Patient Education  2017 [employer].

## 2019-07-21 NOTE — Progress Notes (Signed)
This visit is being conducted via phone call due to the COVID-19 pandemic. This patient has given me verbal consent via phone to conduct this visit, patient states they are participating from their home address. Some vital signs may be absent or patient reported.   Patient identification: identified by name, DOB, and current address.  Location provider: [facility], Office Persons participating in the virtual visit: Mr. Sari Petrou and Franne Forts, LPN.     Subjective:   [name] [name] is a 69 y.o. male who presents for Medicare Annual/Subsequent preventive examination.  Mr. Smutny participates in the [facility] program at [facility] 3 x week via zoom exercises and also works with a Physiological scientist 2 x week via zoom. He also walks 2-3 miles several days a week. He admits he should drink more water and would like to lose some weight; ultimate goal of 180 lbs.   Review of Systems:  Cardiac Risk Factors include: hypertension;male gender;advanced age (>68men, >2 women)     Objective:    Vitals: Ht 6\' 1"  (1.854 m)   Wt 200 lb (90.7 kg)   BMI 26.39 kg/m   Body mass index is 26.39 kg/m.  Advanced Directives 07/21/2019 04/08/2018 07/17/2017 03/14/2017 03/30/2014 03/14/2014  Does Patient Have a Medical Advance Directive? Yes Yes No Yes Yes Yes  Type of Paramedic of [place];Living will - - - - Press photographer;Living will  Does patient want to make changes to medical advance directive? No - Patient declined - - - No - Patient declined No - Patient declined  Copy of [place] in Chart? No - copy requested - - - No - copy requested No - copy requested  Would patient like information on creating a medical advance directive? - - No - Patient declined - - -    Tobacco Social History   Tobacco Use  Smoking Status Former Smoker  . Packs/day: 0.50  . Years: 5.00  . Pack years: 2.50  . Types: Cigarettes  Smokeless Tobacco Never Used  Tobacco  Comment   "quit smoking cigarettes in the late 1970's"     Counseling given: Not Answered Comment: "quit smoking cigarettes in the late 1970's"   Clinical Intake:  Pre-visit preparation completed: Yes  Pain : No/denies pain     Nutritional Status: BMI 25 -29 Overweight Nutritional Risks: None Diabetes: No  How often do you need to have someone help you when you read instructions, pamphlets, or other written materials from your doctor or pharmacy?: 1 - Never What is the last grade level you completed in school?: college graduate  Interpreter Needed?: No  Information entered by :: Franne Forts, LPN.  Past Medical History:  Diagnosis Date  . Chicken pox   . Coronary artery disease    Severe three-vessel coronary artery disease with ejection fraction of 65%  . GERD (gastroesophageal reflux disease)   . History of gout   . History of psoriasis   . Hypercholesterolemia   . Hypertension   . OSA (obstructive sleep apnea)    Moderate with AHI of 16.9/hr now on CPAP at 10cm H2O  . Psoriatic arthritis [facility])    Past Surgical History:  Procedure Laterality Date  . ATRIAL FLUTTER ABLATION  03/30/2014  . ATRIAL FLUTTER ABLATION N/A 03/30/2014   Procedure: ATRIAL FLUTTER ABLATION;  Surgeon: Coralyn Mark, MD;  Location: [facility] CATH LAB;  Service: Cardiovascular;  Laterality: N/A;  . CARDIAC CATHETERIZATION  09/2003   Ejection fraction  was estimated at 65%.  . CARDIOVERSION N/A 03/15/2014   Procedure: CARDIOVERSION;  Surgeon: Lelon Perla, MD;  Location: [facility] ENDOSCOPY;  Service: Cardiovascular;  Laterality: N/A;  . CARDIOVERSION N/A 07/17/2017   Procedure: CARDIOVERSION;  Surgeon: Sueanne Margarita, MD;  Location: [facility];  Service: Cardiovascular;  Laterality: N/A;  . CORONARY ARTERY BYPASS GRAFT  09/2003   [place]-lad,svg-diag,svg-om/distal LCX,svg-pda  . TEE WITHOUT CARDIOVERSION N/A 03/15/2014   Procedure: TRANSESOPHAGEAL ECHOCARDIOGRAM (TEE);  Surgeon: Lelon Perla, MD;   Location: [facility] ENDOSCOPY;  Service: Cardiovascular;  Laterality: N/A;  . TEE WITHOUT CARDIOVERSION N/A 07/17/2017   Procedure: TRANSESOPHAGEAL ECHOCARDIOGRAM (TEE);  Surgeon: Sueanne Margarita, MD;  Location: [facility] ENDOSCOPY;  Service: Cardiovascular;  Laterality: N/A;  . TONSILLECTOMY  1950's   Family History  Problem Relation Age of Onset  . Heart disease Mother        cabg  . Cancer Mother        breast  . Stroke Mother    Social History   Socioeconomic History  . Marital status: Married    Spouse name: Not on file  . Number of children: 1  . Years of education: 4 years college  . Highest education level: Bachelor's degree (e.g., BA, AB, BS)  Occupational History  . Occupation: Print production planner: [employer]    Comment: works part time   Tobacco Use  . Smoking status: Former Smoker    Packs/day: 0.50    Years: 5.00    Pack years: 2.50    Types: Cigarettes  . Smokeless tobacco: Never Used  . Tobacco comment: "quit smoking cigarettes in the late 1970's"  Substance and Sexual Activity  . Alcohol use: Yes    Alcohol/week: 4.0 standard drinks    Types: 2 Cans of beer, 2 Shots of liquor per week    Comment: 1-2  drinks a week  . Drug use: No  . Sexual activity: Yes  Other Topics Concern  . Not on file  Social History Narrative   Working part time   Married; [place] of 2   1 child   Social Determinants of Radio broadcast assistant Strain: Low Risk   . Difficulty of Paying Living Expenses: Not hard at all  Food Insecurity:   . Worried About Charity fundraiser in the Last Year: Not on file  . Ran Out of Food in the Last Year: Not on file  Transportation Needs: No Transportation Needs  . Lack of Transportation (Medical): No  . Lack of Transportation (Non-Medical): No  Physical Activity: Sufficiently Active  . Days of Exercise per Week: 5 days  . Minutes of Exercise per Session: 40 min  Stress:   . Feeling of Stress : Not on file  Social Connections:  Unknown  . Frequency of Communication with Friends and Family: More than three times a week  . Frequency of Social Gatherings with Friends and Family: Once a week  . Attends Religious Services: Not on file  . Active Member of Clubs or Organizations: Not on file  . Attends Archivist Meetings: Not on file  . Marital Status: Married    Outpatient Encounter Medications as of 07/21/2019  Medication Sig  . atorvastatin (LIPITOR) 40 MG tablet TAKE 1 TABLET BY MOUTH EVERY DAY  . azelastine (ASTELIN) 0.1 % nasal spray Place 1 spray into both nostrils 2 (two) times daily. Use in each nostril as directed  . cholecalciferol (VITAMIN D) 1000 UNITS  tablet Take 1,000 Units by mouth daily.    . Coenzyme Q10 (CO Q 10 PO) Take 100 mg by mouth daily.   . colchicine 0.6 MG tablet TAKE 1 TABLET BY MOUTH TWICE A DAY  . ELIQUIS 5 MG TABS tablet TAKE 1 TABLET BY MOUTH TWICE A DAY  . furosemide (LASIX) 20 MG tablet Take 20 mg by mouth daily.  Marland Kitchen losartan (COZAAR) 25 MG tablet Take 1 tablet (25 mg total) by mouth daily. OFFICE VISIT NEEDED  . methotrexate 2.5 MG tablet Take 2.5 mg by mouth once a week. Caution:Chemotherapy. Protect from light.  . metoprolol succinate (TOPROL-XL) 25 MG 24 hr tablet Take 1 tablet (25 mg total) by mouth 2 (two) times daily.  . multivitamin (THERAGRAN) per tablet Take 1 tablet by mouth daily.    . nitroGLYCERIN (NITROSTAT) 0.4 MG SL tablet PLACE 1 TABLET UNDER THE TONGUE EVERY 5 MINUTES AS NEEDED FOR CHEST PAIN.  Marland Kitchen potassium chloride SA (K-DUR,KLOR-CON) 20 MEQ tablet Take 1 tablet (20 mEq total) by mouth daily.  . tadalafil (CIALIS) 20 MG tablet Take 1 tablet (20 mg total) by mouth every other day.  Marland Kitchen omeprazole (PRILOSEC) 20 MG capsule Take 1 capsule (20 mg total) by mouth 2 (two) times daily. (Patient not taking: Reported on 07/21/2019)   No facility-administered encounter medications on file as of 07/21/2019.    Activities of Daily Living In your present state of  health, do you have any difficulty performing the following activities: 07/21/2019  Hearing? N  Vision? N  Difficulty concentrating or making decisions? N  Walking or climbing stairs? N  Dressing or bathing? N  Doing errands, shopping? N  Preparing Food and eating ? N  Using the Toilet? N  In the past six months, have you accidently leaked urine? N  Do you have problems with loss of bowel control? N  Managing your Medications? N  Managing your Finances? N  Housekeeping or managing your Housekeeping? N  Some recent data might be hidden    Patient Care Team: [name], MD as PCP - General (Family Medicine)   Assessment:   This is a routine wellness examination for [place].  Exercise Activities and Dietary recommendations Current Exercise Habits: Home exercise routine;Structured exercise class, Type of exercise: calisthenics;walking, Time (Minutes): 40, Frequency (Times/Week): 6, Weekly Exercise (Minutes/Week): 240, Intensity: Moderate, Exercise limited by: None identified  Goals    . Weight (lb) < 200 lb (90.7 kg)     Patient would like to be at 180 lbs Reduce portions       Fall Risk Fall Risk  07/21/2019 04/08/2018 03/14/2017 10/12/2014  Falls in the past year? 0 No Yes No  Number falls in past yr: - - 1 -  Comment - - fell going hiking  -  Risk for fall due to : Medication side effect - - -  Follow up Falls evaluation completed;Education provided;Falls prevention discussed - Education provided -   Is the patient's home free of loose throw rugs in walkways, pet beds, electrical cords, etc?   yes      Grab bars in the bathroom? yes      Handrails on the stairs?   yes      Adequate lighting?   yes  Timed Get Up and Go Performed: N/A due to telephone visit  Depression Screen PHQ 2/9 Scores 07/21/2019 04/08/2018 03/14/2017 10/12/2014  PHQ - 2 Score 0 0 0 0    Cognitive Function MMSE - Mini Mental  State Exam 04/08/2018 03/14/2017  Not completed: (No Data) (No Data)       6CIT Screen 07/21/2019  What Year? 0 points  What month? 0 points  What time? 0 points  Count back from 20 0 points  Months in reverse 0 points  Repeat phrase 0 points  Total Score 0    Immunization History  Administered Date(s) Administered  . Fluad Quad(high Dose 65+) 06/09/2019  . Influenza, High Dose Seasonal PF 05/06/2017, 06/10/2018  . Influenza,inj,Quad PF,6+ Mos 08/20/2013, 03/31/2014  . Influenza-Unspecified 05/14/2017  . Pneumococcal Conjugate-13 08/20/2013  . Td 07/23/2008    Qualifies for Shingles Vaccine? Yes, he will check with pharmacy for his out of pocket cost to obtain.   Screening Tests Health Maintenance  Topic Date Due  . PNA vac Low Risk Adult (2 of 2 - PPSV23) 08/20/2014  . TETANUS/TDAP  07/23/2018  . COLONOSCOPY  11/06/2021  . INFLUENZA VACCINE  Completed  . Hepatitis C Screening  Completed   Cancer Screenings: Lung: Low Dose CT Chest recommended if Age 28-80 years, 30 pack-year currently smoking OR have quit w/in 15years. Patient does not qualify. Colorectal: Yes  Additional Screenings:  Hepatitis C Screening: completed 02/24/2019.      Plan:   Recommendations for updated Tdap and Shingrix vaccines. He may obtain these at local pharmacy as cheapest option. Goals to consume more water and to decrease portion sizes at meals.   I have personally reviewed and noted the following in the patient's chart:   . Medical and social history . Use of alcohol, tobacco or illicit drugs  . Current medications and supplements . Functional ability and status . Nutritional status . Physical activity . Advanced directives . List of other physicians . Hospitalizations, surgeries, and ER visits in previous 12 months . Vitals . Screenings to include cognitive, depression, and falls . Referrals and appointments  In addition, I have reviewed and discussed with patient certain preventive protocols, quality metrics, and best practice recommendations. A  written personalized care plan for preventive services as well as general preventive health recommendations were provided to patient.     Franne Forts, LPN  [phone]

## 2019-07-23 ENCOUNTER — Other Ambulatory Visit: Payer: Self-pay | Admitting: Cardiology

## 2019-07-23 NOTE — Telephone Encounter (Signed)
Prescription refill request for Eliquis received.  Last office visit: 07/29/2018, [place] Scr: 1.02, 07/29/2018 Age: 68 y.o. Weight: 96.3kg    Called pt and clarified that he is still seeing [name] and Dr. Radford Pax. Prescription refill sent.

## 2019-08-18 NOTE — Progress Notes (Signed)
Cardiology Office Note   Date:  08/27/2019   ID:  [name], [name] 03/01/1951, MRN [id number]  PCP:  [name], MD  Cardiologist:   Tobi Groesbeck Martin[place], MD   Chief Complaint  Patient presents with  . Atrial Fibrillation  . Congestive Heart Failure  . Aortic Insuffiency      History of Present Illness: [name] [name] is a 69 y.o. male who is seen for follow up Afib, CAD, and CHF. He has a history of  atrial flutter and CAD. He is status post CABG in 2005. He had a normal stress echo in May of 2013. In August 2015 he presented with atrial flutter with RVR. He had mildly elevated troponins. He had successful TEE guided DCCV. He then underwent ablation of atrial flutter and ectopic atrial tachycardia on 03/30/14 by Dr. [name].  Stress Myoview September 2015 demonstrated excellent exercise tolerance and normal perfusion. TEE showed normal LV function, mild biatrial enlargement, mild MR,TR,AI. He does have moderate obstructive sleep apnea followed by Dr. Radford Pax and is on CPAP therapy.  He was seen in December 2018 with  an irregular and fast pulse. Was found to be in AFib with RVR. Echo showed EF 40-45% with moderate MR and AI and moderate biatrial enlargement. Toprol dose was increased and he was started on Eliquis. Underwent TEE guided DCCV on 07/17/17. Echo showed EF 40% with moderate biatrial enlargement. Moderate to severe MR, moderate TR, patent foramen ovale. He was successfully cardioverted but had recurrence of Afib on 07/24/17. He was seen in Afib clinic with Dr. [name] for consideration of Afib ablation versus AAD therapy. There was discussion about starting him on Tikosyn. Given new LV dysfunction and valvular disease it was felt that right and left heart cath were indicated prior to initiation of AAD therapy.  He also wore a monitor that showed episodes of wide complex tachycardia and it was unclear whether this was aberrancy versus VT. After our last visit he got a  second opinion  at [facility] with Dr. [name]. He was admitted in February and had a nuclear stress test showing no ischemia. EF 44%. He was loaded with Tikosyn and DCCV was performed. he had significant QT prolongation and Tikosyn was discontinued. He later underwent Cardiac MRI. He underwent AFib ablation with pulmonary vein isolation on 10/15/17. Echo following ablation showed EF 50-55%.  Both MRI and Echo showed evidence of moderate to severe AI. LV function had returned to normal with restoration of NSR.  In June 2020 he had recurrent Afib and underwent DCCV at [facility]. On follow up he was noted to be bradycardic and metoprolol dose was reduced. In September he had an Echo at [facility] showing normal LV function and moderate AI.   He states he feels well. No chest pain or dyspnea. Still notes some mild swelling of ankles if he skips his lasix dose. No orthopnea or PND. Has noted BP running higher this week sometimes [id number] systolic. He is walking 5 days a week for 2-3 miles.      Past Medical History:  Diagnosis Date  . Chicken pox   . Coronary artery disease    Severe three-vessel coronary artery disease with ejection fraction of 65%  . GERD (gastroesophageal reflux disease)   . History of gout   . History of psoriasis   . Hypercholesterolemia   . Hypertension   . OSA (obstructive sleep apnea)    Moderate with AHI of 16.9/hr now on CPAP at 10cm  H2O  . Psoriatic arthritis [facility])     Past Surgical History:  Procedure Laterality Date  . ATRIAL FLUTTER ABLATION  03/30/2014  . ATRIAL FLUTTER ABLATION N/A 03/30/2014   Procedure: ATRIAL FLUTTER ABLATION;  Surgeon: Coralyn Mark, MD;  Location: [facility] CATH LAB;  Service: Cardiovascular;  Laterality: N/A;  . CARDIAC CATHETERIZATION  09/2003   Ejection fraction was estimated at 65%.  . CARDIOVERSION N/A 03/15/2014   Procedure: CARDIOVERSION;  Surgeon: Lelon Perla, MD;  Location: [facility] ENDOSCOPY;  Service: Cardiovascular;  Laterality: N/A;  . CARDIOVERSION N/A 07/17/2017     Procedure: CARDIOVERSION;  Surgeon: Sueanne Margarita, MD;  Location: [facility];  Service: Cardiovascular;  Laterality: N/A;  . CORONARY ARTERY BYPASS GRAFT  09/2003   Lima-lad,svg-diag,svg-om/distal LCX,svg-pda  . TEE WITHOUT CARDIOVERSION N/A 03/15/2014   Procedure: TRANSESOPHAGEAL ECHOCARDIOGRAM (TEE);  Surgeon: Lelon Perla, MD;  Location: [facility] ENDOSCOPY;  Service: Cardiovascular;  Laterality: N/A;  . TEE WITHOUT CARDIOVERSION N/A 07/17/2017   Procedure: TRANSESOPHAGEAL ECHOCARDIOGRAM (TEE);  Surgeon: Sueanne Margarita, MD;  Location: [facility] ENDOSCOPY;  Service: Cardiovascular;  Laterality: N/A;  . TONSILLECTOMY  1950's     Current Outpatient Medications  Medication Sig Dispense Refill  . atorvastatin (LIPITOR) 40 MG tablet TAKE 1 TABLET BY MOUTH EVERY DAY 90 tablet 2  . azelastine (ASTELIN) 0.1 % nasal spray Place 1 spray into both nostrils 2 (two) times daily. Use in each nostril as directed 30 mL 12  . cholecalciferol (VITAMIN D) 1000 UNITS tablet Take 1,000 Units by mouth daily.      . Coenzyme Q10 (CO Q 10 PO) Take 100 mg by mouth daily.     . colchicine 0.6 MG tablet TAKE 1 TABLET BY MOUTH TWICE A DAY 60 tablet 3  . ELIQUIS 5 MG TABS tablet TAKE 1 TABLET BY MOUTH TWICE A DAY 180 tablet 1  . furosemide (LASIX) 20 MG tablet Take 20 mg by mouth daily.    Marland Kitchen losartan (COZAAR) 25 MG tablet Take 1 tablet (25 mg total) by mouth daily. OFFICE VISIT NEEDED 90 tablet 0  . methotrexate 2.5 MG tablet Take 2.5 mg by mouth once a week. Caution:Chemotherapy. Protect from light.    . metoprolol succinate (TOPROL-XL) 25 MG 24 hr tablet Take 1 tablet (25 mg total) by mouth 2 (two) times daily. Please keep upcoming appt for refills. Thank you (Patient taking differently: Take 25 mg by mouth. Please keep upcoming appt for refills. Thank you) 60 tablet 0  . multivitamin (THERAGRAN) per tablet Take 1 tablet by mouth daily.      . nitroGLYCERIN (NITROSTAT) 0.4 MG SL tablet PLACE 1 TABLET UNDER THE TONGUE  EVERY 5 MINUTES AS NEEDED FOR CHEST PAIN. 25 tablet 5  . omeprazole (PRILOSEC) 20 MG capsule Take 1 capsule (20 mg total) by mouth 2 (two) times daily. 30 capsule 0  . potassium chloride SA (K-DUR,KLOR-CON) 20 MEQ tablet Take 1 tablet (20 mEq total) by mouth daily. 30 tablet 6  . tadalafil (CIALIS) 20 MG tablet Take 1 tablet (20 mg total) by mouth every other day. 6 tablet 11   No current facility-administered medications for this visit.    Allergies:   Patient has no known allergies.    Social History:  The patient  reports that he has quit smoking. His smoking use included cigarettes. He has a 2.50 pack-year smoking history. He has never used smokeless tobacco. He reports current alcohol use of about 4.0 standard drinks of alcohol per week.  He reports that he does not use drugs.   Family History:  The patient's family history includes Cancer in his mother; Heart disease in his mother; Stroke in his mother.    ROS:  Please see the history of present illness.   Otherwise, review of systems are positive for none.   All other systems are reviewed and negative.    PHYSICAL EXAM: VS:  BP (!) 143/72   Pulse 65   Temp 97.8 F (36.6 C)   Ht 6\' 1"  (1.854 m)   Wt 206 lb 12.8 oz (93.8 kg)   SpO2 97%   BMI 27.28 kg/m  , BMI Body mass index is 27.28 kg/m. GENERAL:  Well appearing WM in NAD HEENT:  PERRL, EOMI, sclera are clear. Oropharynx is clear. NECK:  No jugular venous distention, carotid upstroke brisk and symmetric, no bruits, no thyromegaly or adenopathy LUNGS:  Clear to auscultation bilaterally CHEST:  Unremarkable HEART:  RRR with occ. Extrasystole,  PMI not displaced or sustained,S1 and S2 within normal limits, no S3, no S4: no clicks, no rubs, very soft 1/6 diastolic murmur. ABD:  Soft, nontender. BS +, no masses or bruits. No hepatomegaly, no splenomegaly EXT:  2 + pulses throughout, no edema, chronic venous stasis changes. SKIN:  Warm and dry.  No rashes NEURO:  Alert and  oriented x 3. Cranial nerves II through XII intact. PSYCH:  Cognitively intact   Recent Labs: 02/24/2019: TSH 0.93  Dated 05/08/17: normal CMET and CBC  Lipid Panel    Component Value Date/Time   CHOL 172 07/29/2018 0921   TRIG 63 07/29/2018 0921   HDL 74 07/29/2018 0921   CHOLHDL 2.3 07/29/2018 0921   CHOLHDL 3 11/23/2015 0837   VLDL 16.4 11/23/2015 0837   LDLCALC 85 07/29/2018 0921      Wt Readings from Last 3 Encounters:  08/27/19 206 lb 12.8 oz (93.8 kg)  07/21/19 200 lb (90.7 kg)  09/26/18 212 lb 6.4 oz (96.3 kg)    Ecg today shows NSR with PACs. Otherwise normal. Rate 65.  I have personally reviewed and interpreted this study.   Other studies Reviewed: Additional studies/ records that were reviewed today include: none.  ETT 03/06/17: Study Highlights     Blood pressure demonstrated a hypertensive response to exercise.  Upsloping ST segment depression ST segment depression of 1 mm was noted during stress in the II, III, aVF, V4 and V5 leads, and returning to baseline after less than 1 minute of recovery.   1. Good exercise tolerance.  2. Upsloping ST depression in inferior leads and V4/V5.  This is nonspecific.   No evidence for ischemia.    Echo 07/08/17: Study Conclusions  - Left ventricle: The cavity size was normal. Wall thickness was   increased in a pattern of moderate LVH. Systolic function was   mildly to moderately reduced. The estimated ejection fraction was   in the range of 40% to 45%. - Aortic valve: There was moderate regurgitation. - Mitral valve: There was moderate regurgitation. - Left atrium: The atrium was moderately dilated. - Right atrium: The atrium was moderately dilated.  TEE 07/17/17: Study Conclusions  - Left ventricle: The estimated ejection fraction was in the range   of 40% to 45%. Mild diffuse hypokinesis with no identifiable   regional variations. No evidence of thrombus. - Aortic valve: There was moderate perivalvular  regurgitation. - Mitral valve: No evidence of vegetation. There was moderate to   severe regurgitation, with multiple jets. Diastolic  regurgitation   was absent. - Left atrium: The atrium was moderately dilated. No evidence of   thrombus in the atrial cavity or appendage. No evidence of   thrombus in the atrial cavity or appendage. Emptying velocity was   reduced. - Right atrium: The atrium was moderately dilated. - Atrial septum: There was a medium-sized fenestrated patent   foramen ovale. A in the fossa ovalis region was present. There   was a moderate bidirectional shunt through a patent foramen   ovale, in the baseline state. - Tricuspid valve: No evidence of vegetation. There was moderate   regurgitation. - Pulmonic valve: No evidence of vegetation.  Impressions:  - Successful cardioversion. No cardiac source of emboli was   indentified.  Event monitor 07/25/17: Study Highlights   Atrial fibrillation Ventricular rates are frequently elevated Frequent nonsustained ventricular tachycardia as well as aberrantly conducted afib are note No sustained ventricular arrhythmias No prolonged pauses or AV block   Cardiac MRI 10/14/17: Cardiac MRI and thoracic MRA with and without contrast was performed on a 1.5 T MRI scanner to evaluate myocardial morphology, function, viability and assess pulmonary vein anatomy in a patient with hx of GERD, HTN, hypercholesterolemia, OSA on CPAP, CAD s/p CABG (LIMA to LAD, svg to D1, sequential svg to OM and distal LCx, and svg to PDA), atrial flutter s/p ablation, atrial fibrillation, and wide complex tachycardia thought to due to SVT with aberrancy.Transthoracic echo demonstrated an LVEF of 40-45%, normal cavity size, moderate MR, and moderate AR.Transesophageal echo demonstrated moderate aortic regurgitation, moderate-severe mitral regurgitation, and a PFO.Nuclear stress demonstrated a mildly dilated LV with no perfusion defect and an EF of  44%.The patient is scheduled for ablation for atrial fibrillation.  Cardiac MRI 1. The left ventricle is moderately dilated.Wall thickness is normal.Regional and global LV systolic function are low normal.The LVEF is calculated at 59%.  2. The right ventricle is normal in cavity size and wall thickness.Global RV systolic function is low normal to mildly reduced.The RVEF is calculated at 50%.  3. Both atria are severely enlarged.  4. The aortic valve is trileaflet in morphology. There is no significant aortic stenosis. There is moderate-severe aortic regurgitation.The regurgitant orifice area measures 0.26 cm^2.There is reversal of flow in the proximal descending thoracic aorta.  5.There ismild-moderate mitral regurgitation.There is moderate tricuspid regurgitation.There is mild pulmonic regurgitation.  6. Delayed enhancement imaging demonstrates no evidence of myocardial infarction, scarring,or infiltration.  7.There is no evidence of an intracardiac thrombus.  8.The pericardium is normal in thickness.There is no significant pericardial effusion.  Thoracic MRA 1. There are 3 right sided pulmonary veins.The right lower and right middle pulmonary veins enter the LA immediately adjacent to one another. The right middle vein is small.  The 2 left sided pulmonary veins enter the LA normally.  All veins are patent without significant stenosis proximally.   MRA bi-orthogonal luminal dimensions are listed below: RUPV: 2.0 x 1.8 cm, 76 cm/sec RMPV: 0.6 x 0.5 cm RLPV: 2.0 x 1.7 cm, 60 cm/ sec LUPV: 1.9 x 1.0 cm, 59 cm/ sec LLPV: 1.9 x 0.9 cm, 60 cm/ sec  MRA LA dimensions Head-foot: 7.1 cm Right-left:6.2 cm Anterior-posterior:4.3 cm  2. The thoracic aorta is normal in diameter. There is no evidence of a dissection flap.  3. The main and proximal branch pulmonary arteries are normal in size  4. Systemic venous connections are normal.  5.  The esophagus is posterior of the left atrium in close proximity to the ostia of  the left-sided pulmonary veins.The descending thoracic aorta is behind the esophagus and behind the left sided pulmonary Veins.  Echo 10/16/17: INTERPRETATION --------------------------------------------------------------- NORMAL LEFT VENTRICULAR SYSTOLIC FUNCTION WITH MILD LVH NORMAL LA PRESSURES WITH DIASTOLIC DYSFUNCTION NORMAL RIGHT VENTRICULAR SYSTOLIC FUNCTION VALVULAR REGURGITATION: MODERATE AR, MILD MR, TRIVIAL PR, MILD TR NO VALVULAR STENOSIS IRREGULAR RHYTHM Moderate to severe aortic regurgitation. Mild left ventricular enlargement suggests color Doppler may underestimate aortic regurgitation severity. AR VENA CONTRACTA=0.5CM MILD TO MODERATE TR NO PRIOR STUDY FOR COMPARISON  Echo 05/02/18: Study Conclusions  - Left ventricle: The cavity size was mildly dilated. Wall   thickness was increased in a pattern of mild LVH. Systolic   function was normal. The estimated ejection fraction was in the   range of 50% to 55%. Wall motion was normal; there were no   regional wall motion abnormalities. Left ventricular diastolic   function parameters were normal. - Aortic valve: There was mild to moderate regurgitation. - Mitral valve: Calcified annulus. There was mild regurgitation. - Left atrium: The atrium was severely dilated. - Tricuspid valve: There was moderate regurgitation. - Pulmonary arteries: Systolic pressure was mildly increased. PA   peak pressure: 41 mm Hg (S).  Impressions:  - Normal LV systolic function; mild LVE and LVH; mild to moderate   AI; mild MR; severe LAE; moderate TR; mild pulmonary   hypertension.  Echo 03/26/19: INTERPRETATION ---------------------------------------------------------------  NORMAL LEFT VENTRICULAR SYSTOLIC FUNCTION  NORMAL LA PRESSURES WITH DIASTOLIC DYSFUNCTION  NORMAL RIGHT VENTRICULAR SYSTOLIC FUNCTION  VALVULAR  REGURGITATION: MODERATE AR, MILD MR, TRIVIAL PR, MILD TR  NO VALVULAR STENOSIS  AR VENA CONTRACTA=0.6CM  MILD TO MODERATE TR  NO PRIOR STUDY FOR COMPARISON   ASSESSMENT AND PLAN:  1. Atrial fibriillation with RVR. Prior history of atrial flutter and atrial tachycardia ablation in 2015. On anticoagulation with Eliquis 5 mg bid. [place] vasc score of 3. On  Toprol XL dose. S/p TEE guided DCCV but remained in normal rhythm for only about a week. Failed Tikosyn due to QT prolongation. Now s/p Afib ablation at [facility] on 10/15/17. Recurrent Afib in June 2020 with repeat DCCV. In NSR today with  PACs. Recommend long term anticoagulation.   2. Coronary disease status post CABG in 2005. He is without chest pain. He had a normal Myoview study September 2015. Low risk ETT in August 2018. Repeat nuclear  study at [facility] 2019 showed no ischemia. EF 44%.   3. Chronic Aortic insufficiency.  Pulse pressure normal. No symptoms. Recent Echo in September showed moderate AI with normal EF.  4. 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   5. Atrial flutter s/p ablation.   6. OSA- now on CPAP. Followed by Dr. Radford Pax.  7. Hyperlipidemia on statin. Will follow up fasting lab today  8. HTN systolic pressure is elevated. I have asked him to monitor for the next 2 weeks and report back to me. If BP stays high I would increase losartan to 50 mg daily     Disposition:   FU with me in 6 months  Signed, Eavan Gonterman Martinique, MD  08/27/2019 8:50 AM    [facility]

## 2019-08-21 ENCOUNTER — Ambulatory Visit: Payer: Medicare HMO

## 2019-08-22 ENCOUNTER — Other Ambulatory Visit: Payer: Self-pay | Admitting: Cardiology

## 2019-08-26 ENCOUNTER — Ambulatory Visit: Payer: Medicare HMO

## 2019-08-27 ENCOUNTER — Ambulatory Visit: Payer: Medicare HMO | Admitting: Cardiology

## 2019-08-27 ENCOUNTER — Other Ambulatory Visit: Payer: Self-pay

## 2019-08-27 ENCOUNTER — Encounter: Payer: Self-pay | Admitting: Cardiology

## 2019-08-27 VITALS — BP 143/72 | HR 65 | Temp 97.8°F | Ht 73.0 in | Wt 206.8 lb

## 2019-08-27 DIAGNOSIS — Z951 Presence of aortocoronary bypass graft: Secondary | ICD-10-CM

## 2019-08-27 DIAGNOSIS — I351 Nonrheumatic aortic (valve) insufficiency: Secondary | ICD-10-CM

## 2019-08-27 DIAGNOSIS — I4891 Unspecified atrial fibrillation: Secondary | ICD-10-CM | POA: Diagnosis not present

## 2019-08-27 DIAGNOSIS — I4892 Unspecified atrial flutter: Secondary | ICD-10-CM

## 2019-08-27 DIAGNOSIS — I10 Essential (primary) hypertension: Secondary | ICD-10-CM

## 2019-08-27 DIAGNOSIS — I2510 Atherosclerotic heart disease of native coronary artery without angina pectoris: Secondary | ICD-10-CM | POA: Diagnosis not present

## 2019-08-27 LAB — BASIC METABOLIC PANEL
BUN/Creatinine Ratio: 12 (ref 10–24)
BUN: 12 mg/dL (ref 8–27)
CO2: 24 mmol/L (ref 20–29)
Calcium: 9.9 mg/dL (ref 8.6–10.2)
Chloride: 99 mmol/L (ref 96–106)
Creatinine, Ser: 0.97 mg/dL (ref 0.76–1.27)
GFR calc Af Amer: 90 mL/min/{1.73_m2} (ref >59)
GFR calc non Af Amer: 78 mL/min/{1.73_m2} (ref >59)
Glucose: 96 mg/dL (ref 65–99)
Potassium: 4.3 mmol/L (ref 3.5–5.2)
Sodium: 139 mmol/L (ref 134–144)

## 2019-08-27 LAB — LIPID PANEL
Chol/HDL Ratio: 2.7 ratio (ref 0.0–5.0)
Cholesterol, Total: 236 mg/dL — ABNORMAL HIGH (ref 100–199)
HDL: 86 mg/dL (ref >39)
LDL Chol Calc (NIH): 140 mg/dL — ABNORMAL HIGH (ref 0–99)
Triglycerides: 59 mg/dL (ref 0–149)
VLDL Cholesterol Cal: 10 mg/dL (ref 5–40)

## 2019-08-27 LAB — HEPATIC FUNCTION PANEL
ALT: 21 IU/L (ref 0–44)
AST: 22 IU/L (ref 0–40)
Albumin: 4.7 g/dL (ref 3.7–4.7)
Alkaline Phosphatase: 80 IU/L (ref 39–117)
Bilirubin Total: 0.9 mg/dL (ref 0.0–1.2)
Bilirubin, Direct: 0.22 mg/dL (ref 0.00–0.40)
Total Protein: 6.9 g/dL (ref 6.0–8.5)

## 2019-08-27 NOTE — Patient Instructions (Signed)
Monitor your BP for the next couple of weeks and send me your results through MyChart  Follow up in 6 months

## 2019-08-31 ENCOUNTER — Other Ambulatory Visit: Payer: Self-pay

## 2019-08-31 DIAGNOSIS — I2510 Atherosclerotic heart disease of native coronary artery without angina pectoris: Secondary | ICD-10-CM

## 2019-08-31 DIAGNOSIS — E7800 Pure hypercholesterolemia, unspecified: Secondary | ICD-10-CM

## 2019-08-31 MED ORDER — ATORVASTATIN CALCIUM 80 MG PO TABS: 80.0000 mg | ORAL_TABLET | Freq: Every day | ORAL | 3 refills | Status: DC

## 2019-09-24 ENCOUNTER — Other Ambulatory Visit: Payer: Self-pay | Admitting: *Deleted

## 2019-09-24 MED ORDER — LOSARTAN POTASSIUM 25 MG PO TABS: 25.0000 mg | ORAL_TABLET | Freq: Every day | ORAL | 3 refills | Status: DC

## 2019-09-28 NOTE — Progress Notes (Signed)
Virtual Visit via Video Note   This visit type was conducted due to national recommendations for restrictions regarding the COVID-19 Pandemic (e.g. social distancing) in an effort to limit this patient's exposure and mitigate transmission in our community.  Due to his co-morbid illnesses, this patient is at least at moderate risk for complications without adequate follow up.  This format is felt to be most appropriate for this patient at this time.  All issues noted in this document were discussed and addressed.  A limited physical exam was performed with this format.  Please refer to the patient's chart for his consent to telehealth for [facility].  Evaluation Performed:  Follow-up visit  This visit type was conducted due to national recommendations for restrictions regarding the COVID-19 Pandemic (e.g. social distancing).  This format is felt to be most appropriate for this patient at this time.  All issues noted in this document were discussed and addressed.  No physical exam was performed (except for noted visual exam findings with Video Visits).  Please refer to the patient's chart (MyChart message for video visits and phone note for telephone visits) for the patient's consent to telehealth for [facility].  Date:  09/29/2019   ID:  [name] [name], DOB [birth year], MRN [id number]  Patient Location:  Home  Provider location:   [place]  PCP:  [name], MD  Cardiologist:  [name], MD Sleep Medicine:  Fransico Him, MD Electrophysiologist:  None   Chief Complaint:  OSA, HTN  History of Present Illness:    [name] [name] is a 69 y.o. male who presents via audio/video conferencing for a telehealth visit today.    [name] [name] is a 69 y.o. male with a hx of moderate OSA with an AHI of 16.9 events per hour mainly occurring in the non supine position and during NREM sleep. He is on CPAP at 10cm H2O.  He is doing well with his CPAP device and thinks that he has  gotten used to it.  He tolerates the nasal mask and feels the pressure is adequate.  Since going on CPAP he feels rested in the am and has no significant daytime sleepiness.  He does have some problems mouth dryness.  He does not think that he snores.    The patient does have symptoms concerning for COVID-19 infection (fever, chills, cough, or new shortness of breath).   Prior CV studies:   The following studies were reviewed today:  PAP compliance download from [facility]  Past Medical History:  Diagnosis Date  . Chicken pox   . Coronary artery disease    Severe three-vessel coronary artery disease with ejection fraction of 65%  . GERD (gastroesophageal reflux disease)   . History of gout   . History of psoriasis   . Hypercholesterolemia   . Hypertension   . OSA (obstructive sleep apnea)    Moderate with AHI of 16.9/hr now on CPAP at 10cm H2O  . Psoriatic arthritis [facility])    Past Surgical History:  Procedure Laterality Date  . ATRIAL FLUTTER ABLATION  03/30/2014  . ATRIAL FLUTTER ABLATION N/A 03/30/2014   Procedure: ATRIAL FLUTTER ABLATION;  Surgeon: Coralyn Mark, MD;  Location: [place] CATH LAB;  Service: Cardiovascular;  Laterality: N/A;  . CARDIAC CATHETERIZATION  09/2003   Ejection fraction was estimated at 65%.  . CARDIOVERSION N/A 03/15/2014   Procedure: CARDIOVERSION;  Surgeon: Lelon Perla, MD;  Location: [facility] ENDOSCOPY;  Service: Cardiovascular;  Laterality: N/A;  .  CARDIOVERSION N/A 07/17/2017   Procedure: CARDIOVERSION;  Surgeon: Sueanne Margarita, MD;  Location: [place];  Service: Cardiovascular;  Laterality: N/A;  . CORONARY ARTERY BYPASS GRAFT  09/2003   Lima-lad,svg-diag,svg-om/distal LCX,svg-pda  . TEE WITHOUT CARDIOVERSION N/A 03/15/2014   Procedure: TRANSESOPHAGEAL ECHOCARDIOGRAM (TEE);  Surgeon: Lelon Perla, MD;  Location: [facility] ENDOSCOPY;  Service: Cardiovascular;  Laterality: N/A;  . TEE WITHOUT CARDIOVERSION N/A 07/17/2017   Procedure: TRANSESOPHAGEAL  ECHOCARDIOGRAM (TEE);  Surgeon: Sueanne Margarita, MD;  Location: [facility] ENDOSCOPY;  Service: Cardiovascular;  Laterality: N/A;  . TONSILLECTOMY  1950's     Current Meds  Medication Sig  . atorvastatin (LIPITOR) 80 MG tablet Take 1 tablet (80 mg total) by mouth daily.  Marland Kitchen azelastine (ASTELIN) 0.1 % nasal spray Place 1 spray into both nostrils 2 (two) times daily. Use in each nostril as directed (Patient taking differently: Place 1 spray into both nostrils as needed. Use in each nostril as directed)  . cholecalciferol (VITAMIN D) 1000 UNITS tablet Take 1,000 Units by mouth daily.    . Coenzyme Q10 (CO Q 10 PO) Take 100 mg by mouth daily.   . colchicine 0.6 MG tablet TAKE 1 TABLET BY MOUTH TWICE A DAY  . ELIQUIS 5 MG TABS tablet TAKE 1 TABLET BY MOUTH TWICE A DAY  . furosemide (LASIX) 20 MG tablet Take 20 mg by mouth 2 (two) times daily.   Marland Kitchen losartan (COZAAR) 25 MG tablet Take 1 tablet (25 mg total) by mouth daily. Please keep scheduled appointment  . methotrexate 2.5 MG tablet Take 2.5 mg by mouth once a week. Caution:Chemotherapy. Protect from light.  . metoprolol succinate (TOPROL-XL) 25 MG 24 hr tablet Take 1 tablet (25 mg total) by mouth 2 (two) times daily. Please keep upcoming appt for refills. Thank you (Patient taking differently: Take 25 mg by mouth daily. Please keep upcoming appt for refills. Thank you)  . multivitamin (THERAGRAN) per tablet Take 1 tablet by mouth daily.    . nitroGLYCERIN (NITROSTAT) 0.4 MG SL tablet PLACE 1 TABLET UNDER THE TONGUE EVERY 5 MINUTES AS NEEDED FOR CHEST PAIN.  Marland Kitchen potassium chloride SA (K-DUR,KLOR-CON) 20 MEQ tablet Take 1 tablet (20 mEq total) by mouth daily.  . tadalafil (CIALIS) 20 MG tablet Take 1 tablet (20 mg total) by mouth every other day. (Patient taking differently: Take 20 mg by mouth daily as needed. )     Allergies:   Patient has no known allergies.   Social History   Tobacco Use  . Smoking status: Former Smoker    Packs/day: 0.50    Years:  5.00    Pack years: 2.50    Types: Cigarettes  . Smokeless tobacco: Never Used  . Tobacco comment: "quit smoking cigarettes in the late 1970's"  Substance Use Topics  . Alcohol use: Yes    Alcohol/week: 4.0 standard drinks    Types: 2 Cans of beer, 2 Shots of liquor per week    Comment: 1-2  drinks a week  . Drug use: No     Family Hx: The patient's family history includes Cancer in his mother; Heart disease in his mother; Stroke in his mother.  ROS:   Please see the history of present illness.     All other systems reviewed and are negative.   Labs/Other Tests and Data Reviewed:    Recent Labs: 02/24/2019: TSH 0.93 08/27/2019: ALT 21; BUN 12; Creatinine, Ser 0.97; Potassium 4.3; Sodium 139   Recent Lipid Panel Lab Results  Component Value Date/Time   CHOL 236 (H) 08/27/2019 09:09 AM   TRIG 59 08/27/2019 09:09 AM   HDL 86 08/27/2019 09:09 AM   CHOLHDL 2.7 08/27/2019 09:09 AM   CHOLHDL 3 11/23/2015 08:37 AM   LDLCALC 140 (H) 08/27/2019 09:09 AM    Wt Readings from Last 3 Encounters:  09/29/19 200 lb (90.7 kg)  08/27/19 206 lb 12.8 oz (93.8 kg)  07/21/19 200 lb (90.7 kg)     Objective:    Vital Signs:  BP 117/61   Pulse 63   Ht 6\' 1"  (1.854 m)   Wt 200 lb (90.7 kg)   BMI 26.39 kg/m     ASSESSMENT & PLAN:    1.  OSA -   The patient is tolerating PAP therapy well without any problems. The PAP download was reviewed today and showed an AHI of 1.4/hr on 10 cm H2O with 97% compliance in using more than 4 hours nightly.  The patient has been using and benefiting from PAP use and will continue to benefit from therapy. He has some mouth dryness and I encouraged him to adjust his humidity and wear his chin strap.  2.  HTN -BP controled -continue Toprol XL 25mg  daily and Losartan 25mg  daily -outside labs from PCP reviewed on KPN and showed a creatinine of 0.97 and K+ 4.3 last month  COVID-19 Education: The signs and symptoms of COVID-19 were discussed with the patient  and how to seek care for testing (follow up with PCP or arrange E-visit).  The importance of social distancing was discussed today.  Patient Risk:   After full review of this patient's clinical status, I feel that they are at least moderate risk at this time.  Time:   Today, I have spent 20 minutes on telemedicine discussing medical problems including OSA< HTN and reviewing patient's chart including PAP compliance download from [place].  Medication Adjustments/Labs and Tests Ordered: Current medicines are reviewed at length with the patient today.  Concerns regarding medicines are outlined above.  Tests Ordered: No orders of the defined types were placed in this encounter.  Medication Changes: No orders of the defined types were placed in this encounter.   Disposition:  Follow up in 1 year(s)  Signed, Fransico Him, MD  09/29/2019 8:14 AM    [facility]

## 2019-09-29 ENCOUNTER — Telehealth: Payer: Self-pay

## 2019-09-29 ENCOUNTER — Other Ambulatory Visit: Payer: Self-pay

## 2019-09-29 ENCOUNTER — Telehealth (INDEPENDENT_AMBULATORY_CARE_PROVIDER_SITE_OTHER): Payer: Medicare HMO | Admitting: Cardiology

## 2019-09-29 ENCOUNTER — Encounter: Payer: Self-pay | Admitting: Cardiology

## 2019-09-29 VITALS — BP 117/61 | HR 63 | Ht 73.0 in | Wt 200.0 lb

## 2019-09-29 DIAGNOSIS — I10 Essential (primary) hypertension: Secondary | ICD-10-CM | POA: Diagnosis not present

## 2019-09-29 DIAGNOSIS — G4733 Obstructive sleep apnea (adult) (pediatric): Secondary | ICD-10-CM | POA: Diagnosis not present

## 2019-09-29 NOTE — Patient Instructions (Signed)

## 2019-09-29 NOTE — Telephone Encounter (Signed)
Spoke to patient about email he sent this morning.DrJordan reviewed B/P readings.He advised to increase Losartan to 50 mg daily.Advised to continue to monitor B/P and send readings again in 2 weeks.

## 2019-09-29 NOTE — Telephone Encounter (Signed)
  Patient Consent for Virtual Visit         [name] [name] has provided verbal consent on 09/29/2019 for a virtual visit (video or telephone).   CONSENT FOR VIRTUAL VISIT FOR:  [name] [name]  By participating in this virtual visit I agree to the following:  I hereby voluntarily request, consent and authorize [facility] and its employed or contracted physicians, physician assistants, nurse practitioners or other licensed health care professionals (the Practitioner), to provide me with telemedicine health care services (the "Services") as deemed necessary by the treating Practitioner. I acknowledge and consent to receive the Services by the Practitioner via telemedicine. I understand that the telemedicine visit will involve communicating with the Practitioner through live audiovisual communication technology and the disclosure of certain medical information by electronic transmission. I acknowledge that I have been given the opportunity to request an in-person assessment or other available alternative prior to the telemedicine visit and am voluntarily participating in the telemedicine visit.  I understand that I have the right to withhold or withdraw my consent to the use of telemedicine in the course of my care at any time, without affecting my right to future care or treatment, and that the Practitioner or I may terminate the telemedicine visit at any time. I understand that I have the right to inspect all information obtained and/or recorded in the course of the telemedicine visit and may receive copies of available information for a reasonable fee.  I understand that some of the potential risks of receiving the Services via telemedicine include:  Marland Kitchen Delay or interruption in medical evaluation due to technological equipment failure or disruption; . Information transmitted may not be sufficient (e.g. poor resolution of images) to allow for appropriate medical decision making by the Practitioner;  and/or  . In rare instances, security protocols could fail, causing a breach of personal health information.  Furthermore, I acknowledge that it is my responsibility to provide information about my medical history, conditions and care that is complete and accurate to the best of my ability. I acknowledge that Practitioner's advice, recommendations, and/or decision may be based on factors not within their control, such as incomplete or inaccurate data provided by me or distortions of diagnostic images or specimens that may result from electronic transmissions. I understand that the practice of medicine is not an exact science and that Practitioner makes no warranties or guarantees regarding treatment outcomes. I acknowledge that a copy of this consent can be made available to me via my patient portal ([place] [place]), or I can request a printed copy by calling the office of [place].    I understand that my insurance will be billed for this visit.   I have read or had this consent read to me. . I understand the contents of this consent, which adequately explains the benefits and risks of the Services being provided via telemedicine.  . I have been provided ample opportunity to ask questions regarding this consent and the Services and have had my questions answered to my satisfaction. . I give my informed consent for the services to be provided through the use of telemedicine in my medical care

## 2019-10-28 ENCOUNTER — Other Ambulatory Visit: Payer: Self-pay | Admitting: Cardiology

## 2019-11-02 ENCOUNTER — Other Ambulatory Visit: Payer: Self-pay | Admitting: *Deleted

## 2019-11-02 MED ORDER — FUROSEMIDE 20 MG PO TABS: 20.0000 mg | ORAL_TABLET | Freq: Two times a day (BID) | ORAL | 6 refills | Status: DC

## 2019-11-30 LAB — HEPATIC FUNCTION PANEL
ALT: 22 IU/L (ref 0–44)
AST: 25 IU/L (ref 0–40)
Albumin: 4.6 g/dL (ref 3.7–4.7)
Alkaline Phosphatase: 86 IU/L (ref 39–117)
Bilirubin Total: 1.2 mg/dL (ref 0.0–1.2)
Bilirubin, Direct: 0.34 mg/dL (ref 0.00–0.40)
Total Protein: 6.4 g/dL (ref 6.0–8.5)

## 2019-11-30 LAB — LIPID PANEL
Chol/HDL Ratio: 2.2 ratio (ref 0.0–5.0)
Cholesterol, Total: 163 mg/dL (ref 100–199)
HDL: 74 mg/dL
LDL Chol Calc (NIH): 75 mg/dL (ref 0–99)
Triglycerides: 74 mg/dL (ref 0–149)
VLDL Cholesterol Cal: 14 mg/dL (ref 5–40)

## 2019-12-24 MED ORDER — LOSARTAN POTASSIUM 50 MG PO TABS: 50.0000 mg | ORAL_TABLET | Freq: Every day | ORAL | 3 refills | Status: DC

## 2020-02-24 ENCOUNTER — Other Ambulatory Visit: Payer: Self-pay | Admitting: Cardiology

## 2020-02-24 NOTE — Telephone Encounter (Signed)
Prescription refill request for Eliquis received. Indication:  Atrial Fibrillation Last office visit: 08/27/2019  Martinique Scr: 0.97 08/27/2019 Age: 69 Weight:  90.7 kg   Prescription refilled

## 2020-02-26 ENCOUNTER — Ambulatory Visit (INDEPENDENT_AMBULATORY_CARE_PROVIDER_SITE_OTHER): Payer: Medicare HMO | Admitting: Family Medicine

## 2020-02-26 ENCOUNTER — Encounter: Payer: Self-pay | Admitting: Family Medicine

## 2020-02-26 ENCOUNTER — Other Ambulatory Visit: Payer: Self-pay

## 2020-02-26 VITALS — BP 112/62 | HR 62 | Temp 98.1°F | Ht 73.0 in | Wt 207.8 lb

## 2020-02-26 DIAGNOSIS — Z0000 Encounter for general adult medical examination without abnormal findings: Secondary | ICD-10-CM | POA: Diagnosis not present

## 2020-02-26 DIAGNOSIS — Z23 Encounter for immunization: Secondary | ICD-10-CM

## 2020-02-26 MED ORDER — TADALAFIL 20 MG PO TABS
ORAL_TABLET | ORAL | 5 refills | Status: DC
Start: 2020-02-26 — End: 2021-02-28

## 2020-02-26 NOTE — Progress Notes (Signed)
Established Patient Office Visit  Subjective:  Patient ID: [name] [name], male    DOB: 04-Nov-1950  Age: 69 y.o. MRN: [id number]  CC:  Chief Complaint  Patient presents with  . Annual Exam    pt has no new concerns    HPI [name] [name] presents for physical exam.  He has history of CAD, gout, psoriasis, hyperlipidemia, hypertension, obstructive sleep apnea.  Health maintenance reviewed  -Needs Pneumovax.  He has had previous Prevnar -Covid vaccines already given -Previous hepatitis C screen negative -Colonoscopy due in 2 years -Last tetanus 11 years ago. -No history of shingles vaccine  He has history of erectile dysfunction.  He took sildenafil in the past but states that made his reflux worse.  He would like to consider Cialis.  He does not take nitroglycerin and knows that he absolutely cannot mix this with ED medications  Past Medical History:  Diagnosis Date  . Chicken pox   . Coronary artery disease    Severe three-vessel coronary artery disease with ejection fraction of 65%  . GERD (gastroesophageal reflux disease)   . History of gout   . History of psoriasis   . Hypercholesterolemia   . Hypertension   . OSA (obstructive sleep apnea)    Moderate with AHI of 16.9/hr now on CPAP at 10cm H2O  . Psoriatic arthritis [facility])     Past Surgical History:  Procedure Laterality Date  . ATRIAL FLUTTER ABLATION  03/30/2014  . ATRIAL FLUTTER ABLATION N/A 03/30/2014   Procedure: ATRIAL FLUTTER ABLATION;  Surgeon: Coralyn Mark, MD;  Location: [place] CATH LAB;  Service: Cardiovascular;  Laterality: N/A;  . CARDIAC CATHETERIZATION  09/2003   Ejection fraction was estimated at 65%.  . CARDIOVERSION N/A 03/15/2014   Procedure: CARDIOVERSION;  Surgeon: Lelon Perla, MD;  Location: [facility] ENDOSCOPY;  Service: Cardiovascular;  Laterality: N/A;  . CARDIOVERSION N/A 07/17/2017   Procedure: CARDIOVERSION;  Surgeon: Sueanne Margarita, MD;  Location: [place];  Service: Cardiovascular;   Laterality: N/A;  . CORONARY ARTERY BYPASS GRAFT  09/2003   Lima-lad,svg-diag,svg-om/distal LCX,svg-pda  . TEE WITHOUT CARDIOVERSION N/A 03/15/2014   Procedure: TRANSESOPHAGEAL ECHOCARDIOGRAM (TEE);  Surgeon: Lelon Perla, MD;  Location: [facility] ENDOSCOPY;  Service: Cardiovascular;  Laterality: N/A;  . TEE WITHOUT CARDIOVERSION N/A 07/17/2017   Procedure: TRANSESOPHAGEAL ECHOCARDIOGRAM (TEE);  Surgeon: Sueanne Margarita, MD;  Location: [facility] ENDOSCOPY;  Service: Cardiovascular;  Laterality: N/A;  . TONSILLECTOMY  1950's    Family History  Problem Relation Age of Onset  . Heart disease Mother        cabg  . Cancer Mother        breast  . Stroke Mother     Social History   Socioeconomic History  . Marital status: Married    Spouse name: Not on file  . Number of children: 1  . Years of education: 4 years college  . Highest education level: Bachelor's degree (e.g., BA, AB, BS)  Occupational History  . Occupation: Print production planner: [employer]    Comment: works part time   Tobacco Use  . Smoking status: Former Smoker    Packs/day: 0.50    Years: 5.00    Pack years: 2.50    Types: Cigarettes  . Smokeless tobacco: Never Used  . Tobacco comment: "quit smoking cigarettes in the late 1970's"  Vaping Use  . Vaping Use: Never used  Substance and Sexual Activity  . Alcohol use: Yes  Alcohol/week: 4.0 standard drinks    Types: 2 Cans of beer, 2 Shots of liquor per week    Comment: 1-2  drinks a week  . Drug use: No  . Sexual activity: Yes  Other Topics Concern  . Not on file  Social History Narrative   Working part time   Married; [place] of 2   1 child   Social Determinants of Radio broadcast assistant Strain: Low Risk   . Difficulty of Paying Living Expenses: Not hard at all  Food Insecurity:   . Worried About Charity fundraiser in the Last Year:   . Arboriculturist in the Last Year:   Transportation Needs: No Transportation Needs  . Lack of  Transportation (Medical): No  . Lack of Transportation (Non-Medical): No  Physical Activity: Sufficiently Active  . Days of Exercise per Week: 5 days  . Minutes of Exercise per Session: 40 min  Stress:   . Feeling of Stress :   Social Connections: Unknown  . Frequency of Communication with Friends and Family: More than three times a week  . Frequency of Social Gatherings with Friends and Family: Once a week  . Attends Religious Services: Not on file  . Active Member of Clubs or Organizations: Not on file  . Attends Archivist Meetings: Not on file  . Marital Status: Married  Human resources officer Violence:   . Fear of Current or Ex-Partner:   . Emotionally Abused:   Marland Kitchen Physically Abused:   . Sexually Abused:     Outpatient Medications Prior to Visit  Medication Sig Dispense Refill  . azelastine (ASTELIN) 0.1 % nasal spray Place 1 spray into both nostrils 2 (two) times daily. Use in each nostril as directed (Patient taking differently: Place 1 spray into both nostrils as needed. Use in each nostril as directed) 30 mL 12  . cholecalciferol (VITAMIN D) 1000 UNITS tablet Take 1,000 Units by mouth daily.      . Coenzyme Q10 (CO Q 10 PO) Take 100 mg by mouth daily.     . colchicine 0.6 MG tablet TAKE 1 TABLET BY MOUTH TWICE A DAY 60 tablet 3  . ELIQUIS 5 MG TABS tablet TAKE 1 TABLET BY MOUTH TWICE A DAY 180 tablet 1  . furosemide (LASIX) 20 MG tablet Take 1 tablet (20 mg total) by mouth 2 (two) times daily. 60 tablet 6  . losartan (COZAAR) 50 MG tablet Take 1 tablet (50 mg total) by mouth daily. 90 tablet 3  . methotrexate 2.5 MG tablet Take 2.5 mg by mouth once a week. Caution:Chemotherapy. Protect from light.    . metoprolol succinate (TOPROL-XL) 25 MG 24 hr tablet Take 1 tablet (25 mg total) by mouth daily. 180 tablet 2  . multivitamin (THERAGRAN) per tablet Take 1 tablet by mouth daily.      . nitroGLYCERIN (NITROSTAT) 0.4 MG SL tablet PLACE 1 TABLET UNDER THE TONGUE EVERY 5  MINUTES AS NEEDED FOR CHEST PAIN. 25 tablet 5  . potassium chloride SA (K-DUR,KLOR-CON) 20 MEQ tablet Take 1 tablet (20 mEq total) by mouth daily. 30 tablet 6  . omeprazole (PRILOSEC) 20 MG capsule Take 1 capsule (20 mg total) by mouth 2 (two) times daily. 30 capsule 0  . tadalafil (CIALIS) 20 MG tablet Take 1 tablet (20 mg total) by mouth every other day. (Patient taking differently: Take 20 mg by mouth daily as needed. ) 6 tablet 11  . atorvastatin (LIPITOR) 80 MG  tablet Take 1 tablet (80 mg total) by mouth daily. 90 tablet 3   No facility-administered medications prior to visit.    No Known Allergies  ROS Review of Systems  Constitutional: Negative for activity change, appetite change, fatigue and fever.  HENT: Negative for congestion, ear pain and trouble swallowing.   Eyes: Negative for pain and visual disturbance.  Respiratory: Negative for cough, shortness of breath and wheezing.   Cardiovascular: Negative for chest pain and palpitations.  Gastrointestinal: Negative for abdominal distention, abdominal pain, blood in stool, constipation, diarrhea, nausea, rectal pain and vomiting.  Endocrine: Negative for polydipsia and polyuria.  Genitourinary: Negative for dysuria, hematuria and testicular pain.  Musculoskeletal: Negative for arthralgias and joint swelling.  Skin: Positive for rash.  Neurological: Negative for dizziness, syncope and headaches.  Hematological: Negative for adenopathy.  Psychiatric/Behavioral: Negative for confusion and dysphoric mood.      Objective:    Physical Exam Constitutional:      General: He is not in acute distress.    Appearance: He is well-developed.  HENT:     Head: Normocephalic and atraumatic.     Right Ear: External ear normal.     Left Ear: External ear normal.  Eyes:     Conjunctiva/sclera: Conjunctivae normal.     Pupils: Pupils are equal, round, and reactive to light.  Neck:     Thyroid: No thyromegaly.  Cardiovascular:     Rate  and Rhythm: Normal rate and regular rhythm.     Heart sounds: Normal heart sounds. No murmur heard.   Pulmonary:     Effort: No respiratory distress.     Breath sounds: No wheezing or rales.  Abdominal:     General: Bowel sounds are normal. There is no distension.     Palpations: Abdomen is soft. There is no mass.     Tenderness: There is no abdominal tenderness. There is no guarding or rebound.  Musculoskeletal:     Cervical back: Normal range of motion and neck supple.  Lymphadenopathy:     Cervical: No cervical adenopathy.  Skin:    Findings: Rash present.     Comments: He has typical psoriasis rash with erythematous base and thick scaly surface scattered on his trunk and extremities  Neurological:     Mental Status: He is alert and oriented to person, place, and time.     Cranial Nerves: No cranial nerve deficit.     Deep Tendon Reflexes: Reflexes normal.     BP 112/62 (BP Location: Left Arm, Patient Position: Sitting, Cuff Size: Normal)   Pulse 62   Temp 98.1 F (36.7 C) (Oral)   Ht 6\' 1"  (1.854 m)   Wt 207 lb 12.8 oz (94.3 kg)   SpO2 98%   BMI 27.42 kg/m  Wt Readings from Last 3 Encounters:  02/26/20 207 lb 12.8 oz (94.3 kg)  09/29/19 200 lb (90.7 kg)  08/27/19 206 lb 12.8 oz (93.8 kg)     Health Maintenance Due  Topic Date Due  . TETANUS/TDAP  07/23/2018  . INFLUENZA VACCINE  02/21/2020    There are no preventive care reminders to display for this patient.  Lab Results  Component Value Date   TSH 0.93 02/24/2019   Lab Results  Component Value Date   WBC 5.4 07/29/2018   HGB 15.6 07/29/2018   HCT 43.9 07/29/2018   MCV 90 07/29/2018   PLT 157 07/29/2018   Lab Results  Component Value Date   NA 139 08/27/2019  K 4.3 08/27/2019   CO2 24 08/27/2019   GLUCOSE 96 08/27/2019   BUN 12 08/27/2019   CREATININE 0.97 08/27/2019   BILITOT 1.2 11/30/2019   ALKPHOS 86 11/30/2019   AST 25 11/30/2019   ALT 22 11/30/2019   PROT 6.4 11/30/2019   ALBUMIN  4.6 11/30/2019   CALCIUM 9.9 08/27/2019   ANIONGAP 10 03/30/2014   GFR 91.74 11/23/2015   Lab Results  Component Value Date   CHOL 163 11/30/2019   Lab Results  Component Value Date   HDL 74 11/30/2019   Lab Results  Component Value Date   LDLCALC 75 11/30/2019   Lab Results  Component Value Date   TRIG 74 11/30/2019   Lab Results  Component Value Date   CHOLHDL 2.2 11/30/2019   No results found for: HGBA1C    Assessment & Plan:   Problem List Items Addressed This Visit    None    Visit Diagnoses    Physical exam    -  Primary   Relevant Orders   Basic metabolic panel   TSH   CBC with Differential/Platelet   PSA   Need for pneumococcal vaccination       Relevant Orders   Pneumococcal polysaccharide vaccine 23-valent greater than or equal to 2yo subcutaneous/IM (Completed)    -Pneumovax given -Discussed Shingrix vaccine and he will check on insurance coverage -We discussed tetanus but the fact that his insurance may not cover in the absence of injury -Long discussion regarding pros and cons of PSA screening and he would like to check once more.  Check other labs above. -Refilled Cialis to take 1 every other day as needed for ED.  He knows that this cannot be mixed with nitroglycerin and would need to avoid nitroglycerin within 2 days of Cialis dosing.  Meds ordered this encounter  Medications  . tadalafil (CIALIS) 20 MG tablet    Sig: Take one tablet by mouth every other day as needed for erectile dysfunction    Dispense:  10 tablet    Refill:  5    Follow-up: No follow-ups on file.    [name], MD

## 2020-02-26 NOTE — Patient Instructions (Addendum)

## 2020-02-27 LAB — CBC WITH DIFFERENTIAL/PLATELET
Absolute Monocytes: 518 cells/uL (ref 200–950)
Basophils Absolute: 41 cells/uL (ref 0–200)
Basophils Relative: 0.9 %
Eosinophils Absolute: 108 cells/uL (ref 15–500)
Eosinophils Relative: 2.4 %
HCT: 42.5 % (ref 38.5–50.0)
Hemoglobin: 14.8 g/dL (ref 13.2–17.1)
Lymphs Abs: 1058 cells/uL (ref 850–3900)
MCH: 32.2 pg (ref 27.0–33.0)
MCHC: 34.8 g/dL (ref 32.0–36.0)
MCV: 92.6 fL (ref 80.0–100.0)
MPV: 11.5 fL (ref 7.5–12.5)
Monocytes Relative: 11.5 %
Neutro Abs: 2777 cells/uL (ref 1500–7800)
Neutrophils Relative %: 61.7 %
Platelets: 151 10*3/uL (ref 140–400)
RBC: 4.59 10*6/uL (ref 4.20–5.80)
RDW: 12.5 % (ref 11.0–15.0)
Total Lymphocyte: 23.5 %
WBC: 4.5 10*3/uL (ref 3.8–10.8)

## 2020-02-27 LAB — BASIC METABOLIC PANEL
BUN: 10 mg/dL (ref 7–25)
CO2: 26 mmol/L (ref 20–32)
Calcium: 9.5 mg/dL (ref 8.6–10.3)
Chloride: 103 mmol/L (ref 98–110)
Creat: 0.79 mg/dL (ref 0.70–1.18)
Glucose, Bld: 90 mg/dL (ref 65–99)
Potassium: 4.3 mmol/L (ref 3.5–5.3)
Sodium: 141 mmol/L (ref 135–146)

## 2020-02-27 LAB — TSH: TSH: 0.82 mIU/L (ref 0.40–4.50)

## 2020-02-27 LAB — PSA: PSA: 0.8 ng/mL (ref <=4.0)

## 2020-03-18 NOTE — Progress Notes (Signed)
Cardiology Office Note   Date:  03/21/2020   ID:  Breaker, Springer May 03, 1951, MRN [id number]  PCP:  [name], MD  Cardiologist:   Rasheena Talmadge [place], MD   Chief Complaint  Patient presents with  . Coronary Artery Disease  . Atrial Fibrillation      History of Present Illness: [name] [name] is a 69 y.o. male who is seen for follow up Afib, CAD, AI, and CHF. He has a history of  atrial flutter and CAD. He is status post CABG in 2005. He had a normal stress echo in May of 2013. In August 2015 he presented with atrial flutter with RVR. He had mildly elevated troponins. He had successful TEE guided DCCV. He then underwent ablation of atrial flutter and ectopic atrial tachycardia on 03/30/14 by Dr. [name].  Stress Myoview September 2015 demonstrated excellent exercise tolerance and normal perfusion. TEE showed normal LV function, mild biatrial enlargement, mild MR,TR,AI. He does have moderate obstructive sleep apnea followed by Dr. Radford Pax and is on CPAP therapy.  He was seen in December 2018 with  an irregular and fast pulse. Was found to be in AFib with RVR. Echo showed EF 40-45% with moderate MR and AI and moderate biatrial enlargement. Toprol dose was increased and he was started on Eliquis. Underwent TEE guided DCCV on 07/17/17. Echo showed EF 40% with moderate biatrial enlargement. Moderate to severe MR, moderate TR, patent foramen ovale. He was successfully cardioverted but had recurrence of Afib on 07/24/17. He was seen in Afib clinic with Dr. [name] for consideration of Afib ablation versus AAD therapy. There was discussion about starting him on Tikosyn. Given new LV dysfunction and valvular disease it was felt that right and left heart cath were indicated prior to initiation of AAD therapy.  He also wore a monitor that showed episodes of wide complex tachycardia and it was unclear whether this was aberrancy versus VT.  He got a  second opinion at [facility] with Dr. [name]. He was  admitted in February 2019 and had a nuclear stress test showing no ischemia. EF 44%. He was loaded with Tikosyn and DCCV was performed. he had significant QT prolongation and Tikosyn was discontinued. He later underwent Cardiac MRI. He underwent AFib ablation with pulmonary vein isolation on 10/15/17. Echo following ablation showed EF 50-55%.  Both MRI and Echo showed evidence of moderate to severe AI. LV function had returned to normal with restoration of NSR.  In June 2020 he had recurrent Afib and underwent DCCV at [facility]. On follow up he was noted to be bradycardic and metoprolol dose was reduced. In September 2020 he had an Echo at [facility] showing normal LV function and moderate AI.   On follow up today he states he feels well. No chest pain or dyspnea. Still notes some mild swelling of ankles even when taking lasix. Is careful with salt intake. No orthopnea or PND. Has noted BP is higher in his right arm compared to the left. He is exercising 3-5 days a week.   Past Medical History:  Diagnosis Date  . Chicken pox   . Coronary artery disease    Severe three-vessel coronary artery disease with ejection fraction of 65%  . GERD (gastroesophageal reflux disease)   . History of gout   . History of psoriasis   . Hypercholesterolemia   . Hypertension   . OSA (obstructive sleep apnea)    Moderate with AHI of 16.9/hr now on CPAP at 10cm  H2O  . Psoriatic arthritis [facility])     Past Surgical History:  Procedure Laterality Date  . ATRIAL FLUTTER ABLATION  03/30/2014  . ATRIAL FLUTTER ABLATION N/A 03/30/2014   Procedure: ATRIAL FLUTTER ABLATION;  Surgeon: Coralyn Mark, MD;  Location: [place] CATH LAB;  Service: Cardiovascular;  Laterality: N/A;  . CARDIAC CATHETERIZATION  09/2003   Ejection fraction was estimated at 65%.  . CARDIOVERSION N/A 03/15/2014   Procedure: CARDIOVERSION;  Surgeon: Lelon Perla, MD;  Location: [facility] ENDOSCOPY;  Service: Cardiovascular;  Laterality: N/A;  . CARDIOVERSION N/A  07/17/2017   Procedure: CARDIOVERSION;  Surgeon: Sueanne Margarita, MD;  Location: [place];  Service: Cardiovascular;  Laterality: N/A;  . CORONARY ARTERY BYPASS GRAFT  09/2003   [place]-lad,svg-diag,svg-om/distal LCX,svg-pda  . TEE WITHOUT CARDIOVERSION N/A 03/15/2014   Procedure: TRANSESOPHAGEAL ECHOCARDIOGRAM (TEE);  Surgeon: Lelon Perla, MD;  Location: [facility] ENDOSCOPY;  Service: Cardiovascular;  Laterality: N/A;  . TEE WITHOUT CARDIOVERSION N/A 07/17/2017   Procedure: TRANSESOPHAGEAL ECHOCARDIOGRAM (TEE);  Surgeon: Sueanne Margarita, MD;  Location: [facility] ENDOSCOPY;  Service: Cardiovascular;  Laterality: N/A;  . TONSILLECTOMY  1950's     Current Outpatient Medications  Medication Sig Dispense Refill  . atorvastatin (LIPITOR) 80 MG tablet Take 1 tablet (80 mg total) by mouth daily. 90 tablet 3  . azelastine (ASTELIN) 0.1 % nasal spray Place 1 spray into both nostrils 2 (two) times daily. Use in each nostril as directed (Patient taking differently: Place 1 spray into both nostrils as needed. Use in each nostril as directed) 30 mL 12  . cholecalciferol (VITAMIN D) 1000 UNITS tablet Take 1,000 Units by mouth daily.      . Coenzyme Q10 (CO Q 10 PO) Take 100 mg by mouth daily.     . colchicine 0.6 MG tablet TAKE 1 TABLET BY MOUTH TWICE A DAY 60 tablet 3  . ELIQUIS 5 MG TABS tablet TAKE 1 TABLET BY MOUTH TWICE A DAY 180 tablet 1  . furosemide (LASIX) 20 MG tablet Take 1 tablet (20 mg total) by mouth 2 (two) times daily. 60 tablet 6  . losartan (COZAAR) 50 MG tablet Take 1 tablet (50 mg total) by mouth daily. 90 tablet 3  . methotrexate 2.5 MG tablet Take 2.5 mg by mouth once a week. Caution:Chemotherapy. Protect from light.    . metoprolol succinate (TOPROL-XL) 25 MG 24 hr tablet Take 1 tablet (25 mg total) by mouth daily. 180 tablet 2  . multivitamin (THERAGRAN) per tablet Take 1 tablet by mouth daily.      . nitroGLYCERIN (NITROSTAT) 0.4 MG SL tablet PLACE 1 TABLET UNDER THE TONGUE EVERY 5 MINUTES  AS NEEDED FOR CHEST PAIN. 25 tablet 5  . omeprazole (PRILOSEC) 10 MG capsule Take 10 mg by mouth daily.    . potassium chloride SA (K-DUR,KLOR-CON) 20 MEQ tablet Take 1 tablet (20 mEq total) by mouth daily. 30 tablet 6  . tadalafil (CIALIS) 20 MG tablet Take one tablet by mouth every other day as needed for erectile dysfunction 10 tablet 5   No current facility-administered medications for this visit.    Allergies:   Patient has no known allergies.    Social History:  The patient  reports that he has quit smoking. His smoking use included cigarettes. He has a 2.50 pack-year smoking history. He has never used smokeless tobacco. He reports current alcohol use of about 4.0 standard drinks of alcohol per week. He reports that he does not use drugs.  Family History:  The patient's family history includes Cancer in his mother; Heart disease in his mother; Stroke in his mother.    ROS:  Please see the history of present illness.   Otherwise, review of systems are positive for none.   All other systems are reviewed and negative.    PHYSICAL EXAM: VS:  BP 124/70   Pulse 71   Ht 6\' 1"  (1.854 m)   Wt 208 lb (94.3 kg)   SpO2 97%   BMI 27.44 kg/m  , BMI Body mass index is 27.44 kg/m.  On my assessment he has about a 14 mm Hg gradient left arm compared to right.  GENERAL:  Well appearing WM in NAD HEENT:  PERRL, EOMI, sclera are clear. Oropharynx is clear. NECK:  No jugular venous distention, carotid upstroke brisk and symmetric, soft left subclavian bruit, no thyromegaly or adenopathy LUNGS:  Clear to auscultation bilaterally CHEST:  Unremarkable HEART:  RRR with occ. Extrasystole,  PMI not displaced or sustained,S1 and S2 within normal limits, no S3, no S4: no clicks, no rubs, very soft 1/6 diastolic murmur. ABD:  Soft, nontender. BS +, no masses or bruits. No hepatomegaly, no splenomegaly EXT:  2 + pulses throughout, 1+ edema, chronic venous stasis changes. SKIN:  Warm and dry.  No  rashes NEURO:  Alert and oriented x 3. Cranial nerves II through XII intact. PSYCH:  Cognitively intact   Recent Labs: 11/30/2019: ALT 22 02/26/2020: BUN 10; Creat 0.79; Hemoglobin 14.8; Platelets 151; Potassium 4.3; Sodium 141; TSH 0.82  Dated 05/08/17: normal CMET and CBC  Lipid Panel    Component Value Date/Time   CHOL 163 11/30/2019 0827   TRIG 74 11/30/2019 0827   HDL 74 11/30/2019 0827   CHOLHDL 2.2 11/30/2019 0827   CHOLHDL 3 11/23/2015 0837   VLDL 16.4 11/23/2015 0837   LDLCALC 75 11/30/2019 0827      Wt Readings from Last 3 Encounters:  03/21/20 208 lb (94.3 kg)  02/26/20 207 lb 12.8 oz (94.3 kg)  09/29/19 200 lb (90.7 kg)    Ecg today shows NSR with PACs. Otherwise normal. Rate 65.  I have personally reviewed and interpreted this study.   Other studies Reviewed: Additional studies/ records that were reviewed today include: none.  ETT 03/06/17: Study Highlights     Blood pressure demonstrated a hypertensive response to exercise.  Upsloping ST segment depression ST segment depression of 1 mm was noted during stress in the II, III, aVF, V4 and V5 leads, and returning to baseline after less than 1 minute of recovery.   1. Good exercise tolerance.  2. Upsloping ST depression in inferior leads and V4/V5.  This is nonspecific.   No evidence for ischemia.    Echo 07/08/17: Study Conclusions  - Left ventricle: The cavity size was normal. Wall thickness was   increased in a pattern of moderate LVH. Systolic function was   mildly to moderately reduced. The estimated ejection fraction was   in the range of 40% to 45%. - Aortic valve: There was moderate regurgitation. - Mitral valve: There was moderate regurgitation. - Left atrium: The atrium was moderately dilated. - Right atrium: The atrium was moderately dilated.  TEE 07/17/17: Study Conclusions  - Left ventricle: The estimated ejection fraction was in the range   of 40% to 45%. Mild diffuse hypokinesis  with no identifiable   regional variations. No evidence of thrombus. - Aortic valve: There was moderate perivalvular regurgitation. - Mitral valve: No evidence of  vegetation. There was moderate to   severe regurgitation, with multiple jets. Diastolic regurgitation   was absent. - Left atrium: The atrium was moderately dilated. No evidence of   thrombus in the atrial cavity or appendage. No evidence of   thrombus in the atrial cavity or appendage. Emptying velocity was   reduced. - Right atrium: The atrium was moderately dilated. - Atrial septum: There was a medium-sized fenestrated patent   foramen ovale. A in the fossa ovalis region was present. There   was a moderate bidirectional shunt through a patent foramen   ovale, in the baseline state. - Tricuspid valve: No evidence of vegetation. There was moderate   regurgitation. - Pulmonic valve: No evidence of vegetation.  Impressions:  - Successful cardioversion. No cardiac source of emboli was   indentified.  Event monitor 07/25/17: Study Highlights   Atrial fibrillation Ventricular rates are frequently elevated Frequent nonsustained ventricular tachycardia as well as aberrantly conducted afib are note No sustained ventricular arrhythmias No prolonged pauses or AV block   Cardiac MRI 10/14/17: Cardiac MRI and thoracic MRA with and without contrast was performed on a 1.5 T MRI scanner to evaluate myocardial morphology, function, viability and assess pulmonary vein anatomy in a patient with hx of GERD, HTN, hypercholesterolemia, OSA on CPAP, CAD s/p CABG (LIMA to LAD, svg to D1, sequential svg to OM and distal LCx, and svg to PDA), atrial flutter s/p ablation, atrial fibrillation, and wide complex tachycardia thought to due to SVT with aberrancy.Transthoracic echo demonstrated an LVEF of 40-45%, normal cavity size, moderate MR, and moderate AR.Transesophageal echo demonstrated moderate aortic regurgitation, moderate-severe  mitral regurgitation, and a PFO.Nuclear stress demonstrated a mildly dilated LV with no perfusion defect and an EF of 44%.The patient is scheduled for ablation for atrial fibrillation.  Cardiac MRI 1. The left ventricle is moderately dilated.Wall thickness is normal.Regional and global LV systolic function are low normal.The LVEF is calculated at 59%.  2. The right ventricle is normal in cavity size and wall thickness.Global RV systolic function is low normal to mildly reduced.The RVEF is calculated at 50%.  3. Both atria are severely enlarged.  4. The aortic valve is trileaflet in morphology. There is no significant aortic stenosis. There is moderate-severe aortic regurgitation.The regurgitant orifice area measures 0.26 cm^2.There is reversal of flow in the proximal descending thoracic aorta.  5.There ismild-moderate mitral regurgitation.There is moderate tricuspid regurgitation.There is mild pulmonic regurgitation.  6. Delayed enhancement imaging demonstrates no evidence of myocardial infarction, scarring,or infiltration.  7.There is no evidence of an intracardiac thrombus.  8.The pericardium is normal in thickness.There is no significant pericardial effusion.  Thoracic MRA 1. There are 3 right sided pulmonary veins.The right lower and right middle pulmonary veins enter the LA immediately adjacent to one another. The right middle vein is small.  The 2 left sided pulmonary veins enter the LA normally.  All veins are patent without significant stenosis proximally.   MRA bi-orthogonal luminal dimensions are listed below: RUPV: 2.0 x 1.8 cm, 76 cm/sec RMPV: 0.6 x 0.5 cm RLPV: 2.0 x 1.7 cm, 60 cm/ sec LUPV: 1.9 x 1.0 cm, 59 cm/ sec LLPV: 1.9 x 0.9 cm, 60 cm/ sec  MRA LA dimensions Head-foot: 7.1 cm Right-left:6.2 cm Anterior-posterior:4.3 cm  2. The thoracic aorta is normal in diameter. There is no evidence of a dissection flap.  3.  The main and proximal branch pulmonary arteries are normal in size  4. Systemic venous connections are normal.  5. The esophagus  is posterior of the left atrium in close proximity to the ostia of the left-sided pulmonary veins.The descending thoracic aorta is behind the esophagus and behind the left sided pulmonary Veins.  Echo 10/16/17: INTERPRETATION --------------------------------------------------------------- NORMAL LEFT VENTRICULAR SYSTOLIC FUNCTION WITH MILD LVH NORMAL LA PRESSURES WITH DIASTOLIC DYSFUNCTION NORMAL RIGHT VENTRICULAR SYSTOLIC FUNCTION VALVULAR REGURGITATION: MODERATE AR, MILD MR, TRIVIAL PR, MILD TR NO VALVULAR STENOSIS IRREGULAR RHYTHM Moderate to severe aortic regurgitation. Mild left ventricular enlargement suggests color Doppler may underestimate aortic regurgitation severity. AR VENA CONTRACTA=0.5CM MILD TO MODERATE TR NO PRIOR STUDY FOR COMPARISON  Echo 05/02/18: Study Conclusions  - Left ventricle: The cavity size was mildly dilated. Wall   thickness was increased in a pattern of mild LVH. Systolic   function was normal. The estimated ejection fraction was in the   range of 50% to 55%. Wall motion was normal; there were no   regional wall motion abnormalities. Left ventricular diastolic   function parameters were normal. - Aortic valve: There was mild to moderate regurgitation. - Mitral valve: Calcified annulus. There was mild regurgitation. - Left atrium: The atrium was severely dilated. - Tricuspid valve: There was moderate regurgitation. - Pulmonary arteries: Systolic pressure was mildly increased. PA   peak pressure: 41 mm Hg (S).  Impressions:  - Normal LV systolic function; mild LVE and LVH; mild to moderate   AI; mild MR; severe LAE; moderate TR; mild pulmonary   hypertension.  Echo 03/26/19: INTERPRETATION ---------------------------------------------------------------  NORMAL LEFT VENTRICULAR SYSTOLIC  FUNCTION  NORMAL LA PRESSURES WITH DIASTOLIC DYSFUNCTION  NORMAL RIGHT VENTRICULAR SYSTOLIC FUNCTION  VALVULAR REGURGITATION: MODERATE AR, MILD MR, TRIVIAL PR, MILD TR  NO VALVULAR STENOSIS  AR VENA CONTRACTA=0.6CM  MILD TO MODERATE TR  NO PRIOR STUDY FOR COMPARISON   ASSESSMENT AND PLAN:  1. Atrial fibriillation with RVR. Prior history of atrial flutter and atrial tachycardia ablation in 2015. On anticoagulation with Eliquis 5 mg bid. [place] vasc score of 3. On  Toprol XL dose. S/p TEE guided DCCV but remained in normal rhythm for only about a week. Failed Tikosyn due to QT prolongation. Now s/p Afib ablation at [facility] on 10/15/17. Recurrent Afib in June 2020 with repeat DCCV. Recommend long term anticoagulation.   2. Coronary disease status post CABG in 2005. He is without chest pain. He had a normal Myoview study September 2015. Low risk ETT in August 2018. Repeat nuclear  study at [facility] 2019 showed no ischemia. EF 44%.   3. Chronic Aortic insufficiency.  Pulse pressure normal. No symptoms. Plan repeat Echo in September at [facility].   4. 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   5. Atrial flutter s/p ablation.   6. OSA- now on CPAP. Followed by Dr. Radford Pax.  7. Hyperlipidemia on statin. Last LDL 75.   8. HTN BP is under good control  9. Left subclavian stenosis. By exam there is a 14 mm pressure gradient. No symptoms. Will monitor.     Disposition: FU with me in 6 months  Signed, Andriana Casa Martinique, MD  03/21/2020 9:06 AM    [place]

## 2020-03-21 ENCOUNTER — Ambulatory Visit: Payer: Medicare HMO | Admitting: Cardiology

## 2020-03-21 ENCOUNTER — Encounter: Payer: Self-pay | Admitting: Cardiology

## 2020-03-21 ENCOUNTER — Other Ambulatory Visit: Payer: Self-pay

## 2020-03-21 VITALS — BP 124/70 | HR 71 | Ht 73.0 in | Wt 208.0 lb

## 2020-03-21 DIAGNOSIS — Z951 Presence of aortocoronary bypass graft: Secondary | ICD-10-CM

## 2020-03-21 DIAGNOSIS — I4891 Unspecified atrial fibrillation: Secondary | ICD-10-CM | POA: Diagnosis not present

## 2020-03-21 DIAGNOSIS — E7800 Pure hypercholesterolemia, unspecified: Secondary | ICD-10-CM

## 2020-03-21 DIAGNOSIS — I351 Nonrheumatic aortic (valve) insufficiency: Secondary | ICD-10-CM

## 2020-03-21 DIAGNOSIS — I4892 Unspecified atrial flutter: Secondary | ICD-10-CM

## 2020-03-21 DIAGNOSIS — I10 Essential (primary) hypertension: Secondary | ICD-10-CM

## 2020-07-11 ENCOUNTER — Other Ambulatory Visit: Payer: Self-pay | Admitting: Cardiology

## 2020-07-11 NOTE — Telephone Encounter (Signed)
Age 69, weight 94kg, SCr 0.79 on 02/26/20 afib indication, last visit Aug 2021

## 2020-07-28 ENCOUNTER — Other Ambulatory Visit: Payer: Self-pay | Admitting: Family Medicine

## 2020-08-20 ENCOUNTER — Other Ambulatory Visit: Payer: Self-pay | Admitting: Family Medicine

## 2020-08-26 NOTE — Progress Notes (Signed)
Cardiology Office Note   Date:  08/29/2020   ID:  [name], [name] [birth year], MRN [id number]  PCP:  [name], MD  Cardiologist:   Saiya Crist Martinique, MD   Chief Complaint  Patient presents with  . Coronary Artery Disease  . Atrial Fibrillation      History of Present Illness: [name] [name] is a 70 y.o. male who is seen for follow up Afib, CAD, AI, and CHF. He has a history of  atrial flutter and CAD. He is status post CABG in 2005. He had a normal stress echo in May of 2013. In August 2015 he presented with atrial flutter with RVR. He had mildly elevated troponins. He had successful TEE guided DCCV. He then underwent ablation of atrial flutter and ectopic atrial tachycardia on 03/30/14 by Dr. [name].  Stress Myoview September 2015 demonstrated excellent exercise tolerance and normal perfusion. TEE showed normal LV function, mild biatrial enlargement, mild MR,TR,AI. He does have moderate obstructive sleep apnea followed by Dr. Radford Pax and is on CPAP therapy.  He was seen in December 2018 with  an irregular and fast pulse. Was found to be in AFib with RVR. Echo showed EF 40-45% with moderate MR and AI and moderate biatrial enlargement. Toprol dose was increased and he was started on Eliquis. Underwent TEE guided DCCV on 07/17/17. Echo showed EF 40% with moderate biatrial enlargement. Moderate to severe MR, moderate TR, patent foramen ovale. He was successfully cardioverted but had recurrence of Afib on 07/24/17. He was seen in Afib clinic with Dr. [name] for consideration of Afib ablation versus AAD therapy. There was discussion about starting him on Tikosyn. Given new LV dysfunction and valvular disease it was felt that right and left heart cath were indicated prior to initiation of AAD therapy.  He also wore a monitor that showed episodes of wide complex tachycardia and it was unclear whether this was aberrancy versus VT.  He got a  second opinion at [facility] with Dr. [name]. He was  admitted in February 2019 and had a nuclear stress test showing no ischemia. EF 44%. He was loaded with Tikosyn and DCCV was performed. he had significant QT prolongation and Tikosyn was discontinued. He later underwent Cardiac MRI. He underwent AFib ablation with pulmonary vein isolation on 10/15/17. Echo following ablation showed EF 50-55%.  Both MRI and Echo showed evidence of moderate to severe AI. LV function had returned to normal with restoration of NSR.  In June 2020 he had recurrent Afib and underwent DCCV at [facility]. On follow up he was noted to be bradycardic and metoprolol dose was reduced. In September 2020 he had an Echo at [facility] showing normal LV function and moderate AI. He is followed regularly by Dr [name] at [facility]. Seen there in August. Echo done showing moderate AI. Normal LV function.  He was seen there at [facility] in October. Note reports NSR with PACs but Ecg.  On follow up today he states he feels well. No chest pain or dyspnea. Still notes some  swelling of ankles even when taking lasix. Is careful with salt intake. No orthopnea or PND. BP has been well controlled. He is still exercising 3-5 days a week at a program at [facility].    Past Medical History:  Diagnosis Date  . Chicken pox   . Coronary artery disease    Severe three-vessel coronary artery disease with ejection fraction of 65%  . GERD (gastroesophageal reflux disease)   . History of  gout   . History of psoriasis   . Hypercholesterolemia   . Hypertension   . OSA (obstructive sleep apnea)    Moderate with AHI of 16.9/hr now on CPAP at 10cm H2O  . Psoriatic arthritis [facility])     Past Surgical History:  Procedure Laterality Date  . ATRIAL FLUTTER ABLATION  03/30/2014  . ATRIAL FLUTTER ABLATION N/A 03/30/2014   Procedure: ATRIAL FLUTTER ABLATION;  Surgeon: Coralyn Mark, MD;  Location: [facility] CATH LAB;  Service: Cardiovascular;  Laterality: N/A;  . CARDIAC CATHETERIZATION  09/2003   Ejection fraction was estimated at 65%.  .  CARDIOVERSION N/A 03/15/2014   Procedure: CARDIOVERSION;  Surgeon: Lelon Perla, MD;  Location: [facility] ENDOSCOPY;  Service: Cardiovascular;  Laterality: N/A;  . CARDIOVERSION N/A 07/17/2017   Procedure: CARDIOVERSION;  Surgeon: Sueanne Margarita, MD;  Location: [place];  Service: Cardiovascular;  Laterality: N/A;  . CORONARY ARTERY BYPASS GRAFT  09/2003   Lima-lad,svg-diag,svg-om/distal LCX,svg-pda  . TEE WITHOUT CARDIOVERSION N/A 03/15/2014   Procedure: TRANSESOPHAGEAL ECHOCARDIOGRAM (TEE);  Surgeon: Lelon Perla, MD;  Location: [facility] ENDOSCOPY;  Service: Cardiovascular;  Laterality: N/A;  . TEE WITHOUT CARDIOVERSION N/A 07/17/2017   Procedure: TRANSESOPHAGEAL ECHOCARDIOGRAM (TEE);  Surgeon: Sueanne Margarita, MD;  Location: [facility] ENDOSCOPY;  Service: Cardiovascular;  Laterality: N/A;  . TONSILLECTOMY  1950's     Current Outpatient Medications  Medication Sig Dispense Refill  . atorvastatin (LIPITOR) 80 MG tablet Take 1 tablet (80 mg total) by mouth daily. 90 tablet 3  . azelastine (ASTELIN) 0.1 % nasal spray Place 1 spray into both nostrils 2 (two) times daily. Use in each nostril as directed (Patient taking differently: Place 1 spray into both nostrils as needed. Use in each nostril as directed) 30 mL 12  . cholecalciferol (VITAMIN D) 1000 UNITS tablet Take 1,000 Units by mouth daily.    . Coenzyme Q10 (CO Q 10 PO) Take 100 mg by mouth daily.     . colchicine 0.6 MG tablet TAKE 1 TABLET BY MOUTH TWICE A DAY 180 tablet 1  . ELIQUIS 5 MG TABS tablet TAKE 1 TABLET BY MOUTH TWICE A DAY 180 tablet 1  . furosemide (LASIX) 20 MG tablet Take 1 tablet (20 mg total) by mouth 2 (two) times daily. 60 tablet 6  . losartan (COZAAR) 50 MG tablet Take 1 tablet (50 mg total) by mouth daily. 90 tablet 3  . methotrexate 2.5 MG tablet Take 2.5 mg by mouth once a week. Caution:Chemotherapy. Protect from light.    . metoprolol succinate (TOPROL-XL) 25 MG 24 hr tablet Take 1 tablet (25 mg total) by mouth daily. 180  tablet 2  . multivitamin (THERAGRAN) per tablet Take 1 tablet by mouth daily.    . nitroGLYCERIN (NITROSTAT) 0.4 MG SL tablet PLACE 1 TABLET UNDER THE TONGUE EVERY 5 MINUTES AS NEEDED FOR CHEST PAIN. 25 tablet 5  . potassium chloride SA (K-DUR,KLOR-CON) 20 MEQ tablet Take 1 tablet (20 mEq total) by mouth daily. 30 tablet 6  . tadalafil (CIALIS) 20 MG tablet Take one tablet by mouth every other day as needed for erectile dysfunction 10 tablet 5   No current facility-administered medications for this visit.    Allergies:   Patient has no known allergies.    Social History:  The patient  reports that he has quit smoking. His smoking use included cigarettes. He has a 2.50 pack-year smoking history. He has never used smokeless tobacco. He reports current alcohol use of about 4.0  standard drinks of alcohol per week. He reports that he does not use drugs.   Family History:  The patient's family history includes Cancer in his mother; Heart disease in his mother; Stroke in his mother.    ROS:  Please see the history of present illness.   Otherwise, review of systems are positive for none.   All other systems are reviewed and negative.    PHYSICAL EXAM: VS:  BP 130/62   Pulse 60   Ht 6\' 1"  (1.854 m)   Wt 210 lb 6.4 oz (95.4 kg)   BMI 27.76 kg/m  , BMI Body mass index is 27.76 kg/m.  GENERAL:  Well appearing WM in NAD HEENT:  PERRL, EOMI, sclera are clear. Oropharynx is clear. NECK:  No jugular venous distention, carotid upstroke brisk and symmetric, soft left subclavian bruit, no thyromegaly or adenopathy LUNGS:  Clear to auscultation bilaterally CHEST:  Unremarkable HEART:  RRR with occ. Extrasystole,  PMI not displaced or sustained,S1 and S2 within normal limits, no S3, no S4: no clicks, no rubs, very soft 1/6 diastolic murmur. ABD:  Soft, nontender. BS +, no masses or bruits. No hepatomegaly, no splenomegaly EXT:  2 + pulses throughout, tr-1+ edema, chronic venous stasis changes. SKIN:   Warm and dry.  No rashes NEURO:  Alert and oriented x 3. Cranial nerves II through XII intact. PSYCH:  Cognitively intact   Recent Labs: 11/30/2019: ALT 22 02/26/2020: BUN 10; Creat 0.79; Hemoglobin 14.8; Platelets 151; Potassium 4.3; Sodium 141; TSH 0.82  Dated 05/08/17: normal CMET and CBC  Lipid Panel    Component Value Date/Time   CHOL 163 11/30/2019 0827   TRIG 74 11/30/2019 0827   HDL 74 11/30/2019 0827   CHOLHDL 2.2 11/30/2019 0827   CHOLHDL 3 11/23/2015 0837   VLDL 16.4 11/23/2015 0837   LDLCALC 75 11/30/2019 0827      Wt Readings from Last 3 Encounters:  08/29/20 210 lb 6.4 oz (95.4 kg)  03/21/20 208 lb (94.3 kg)  02/26/20 207 lb 12.8 oz (94.3 kg)    Ecg today shows NSR with PACs. Otherwise normal. Rate 60.  I have personally reviewed and interpreted this study.   Other studies Reviewed: Additional studies/ records that were reviewed today include: none.  ETT 03/06/17: Study Highlights     Blood pressure demonstrated a hypertensive response to exercise.  Upsloping ST segment depression ST segment depression of 1 mm was noted during stress in the II, III, aVF, V4 and V5 leads, and returning to baseline after less than 1 minute of recovery.   1. Good exercise tolerance.  2. Upsloping ST depression in inferior leads and V4/V5.  This is nonspecific.   No evidence for ischemia.    Echo 07/08/17: Study Conclusions  - Left ventricle: The cavity size was normal. Wall thickness was   increased in a pattern of moderate LVH. Systolic function was   mildly to moderately reduced. The estimated ejection fraction was   in the range of 40% to 45%. - Aortic valve: There was moderate regurgitation. - Mitral valve: There was moderate regurgitation. - Left atrium: The atrium was moderately dilated. - Right atrium: The atrium was moderately dilated.  TEE 07/17/17: Study Conclusions  - Left ventricle: The estimated ejection fraction was in the range   of 40% to 45%.  Mild diffuse hypokinesis with no identifiable   regional variations. No evidence of thrombus. - Aortic valve: There was moderate perivalvular regurgitation. - Mitral valve: No evidence of vegetation.  There was moderate to   severe regurgitation, with multiple jets. Diastolic regurgitation   was absent. - Left atrium: The atrium was moderately dilated. No evidence of   thrombus in the atrial cavity or appendage. No evidence of   thrombus in the atrial cavity or appendage. Emptying velocity was   reduced. - Right atrium: The atrium was moderately dilated. - Atrial septum: There was a medium-sized fenestrated patent   foramen ovale. A in the fossa ovalis region was present. There   was a moderate bidirectional shunt through a patent foramen   ovale, in the baseline state. - Tricuspid valve: No evidence of vegetation. There was moderate   regurgitation. - Pulmonic valve: No evidence of vegetation.  Impressions:  - Successful cardioversion. No cardiac source of emboli was   indentified.  Event monitor 07/25/17: Study Highlights   Atrial fibrillation Ventricular rates are frequently elevated Frequent nonsustained ventricular tachycardia as well as aberrantly conducted afib are note No sustained ventricular arrhythmias No prolonged pauses or AV block   Cardiac MRI 10/14/17: Cardiac MRI and thoracic MRA with and without contrast was performed on a 1.5 T MRI scanner to evaluate myocardial morphology, function, viability and assess pulmonary vein anatomy in a patient with hx of GERD, HTN, hypercholesterolemia, OSA on CPAP, CAD s/p CABG (LIMA to LAD, svg to D1, sequential svg to OM and distal LCx, and svg to PDA), atrial flutter s/p ablation, atrial fibrillation, and wide complex tachycardia thought to due to SVT with aberrancy.Transthoracic echo demonstrated an LVEF of 40-45%, normal cavity size, moderate MR, and moderate AR.Transesophageal echo demonstrated moderate aortic  regurgitation, moderate-severe mitral regurgitation, and a PFO.Nuclear stress demonstrated a mildly dilated LV with no perfusion defect and an EF of 44%.The patient is scheduled for ablation for atrial fibrillation.  Cardiac MRI 1. The left ventricle is moderately dilated.Wall thickness is normal.Regional and global LV systolic function are low normal.The LVEF is calculated at 59%.  2. The right ventricle is normal in cavity size and wall thickness.Global RV systolic function is low normal to mildly reduced.The RVEF is calculated at 50%.  3. Both atria are severely enlarged.  4. The aortic valve is trileaflet in morphology. There is no significant aortic stenosis. There is moderate-severe aortic regurgitation.The regurgitant orifice area measures 0.26 cm^2.There is reversal of flow in the proximal descending thoracic aorta.  5.There ismild-moderate mitral regurgitation.There is moderate tricuspid regurgitation.There is mild pulmonic regurgitation.  6. Delayed enhancement imaging demonstrates no evidence of myocardial infarction, scarring,or infiltration.  7.There is no evidence of an intracardiac thrombus.  8.The pericardium is normal in thickness.There is no significant pericardial effusion.  Thoracic MRA 1. There are 3 right sided pulmonary veins.The right lower and right middle pulmonary veins enter the LA immediately adjacent to one another. The right middle vein is small.  The 2 left sided pulmonary veins enter the LA normally.  All veins are patent without significant stenosis proximally.   MRA bi-orthogonal luminal dimensions are listed below: RUPV: 2.0 x 1.8 cm, 76 cm/sec RMPV: 0.6 x 0.5 cm RLPV: 2.0 x 1.7 cm, 60 cm/ sec LUPV: 1.9 x 1.0 cm, 59 cm/ sec LLPV: 1.9 x 0.9 cm, 60 cm/ sec  MRA LA dimensions Head-foot: 7.1 cm Right-left:6.2 cm Anterior-posterior:4.3 cm  2. The thoracic aorta is normal in diameter. There is no  evidence of a dissection flap.  3. The main and proximal branch pulmonary arteries are normal in size  4. Systemic venous connections are normal.  5. The esophagus is  posterior of the left atrium in close proximity to the ostia of the left-sided pulmonary veins.The descending thoracic aorta is behind the esophagus and behind the left sided pulmonary Veins.  Echo 10/16/17: INTERPRETATION --------------------------------------------------------------- NORMAL LEFT VENTRICULAR SYSTOLIC FUNCTION WITH MILD LVH NORMAL LA PRESSURES WITH DIASTOLIC DYSFUNCTION NORMAL RIGHT VENTRICULAR SYSTOLIC FUNCTION VALVULAR REGURGITATION: MODERATE AR, MILD MR, TRIVIAL PR, MILD TR NO VALVULAR STENOSIS IRREGULAR RHYTHM Moderate to severe aortic regurgitation. Mild left ventricular enlargement suggests color Doppler may underestimate aortic regurgitation severity. AR VENA CONTRACTA=0.5CM MILD TO MODERATE TR NO PRIOR STUDY FOR COMPARISON  Echo 05/02/18: Study Conclusions  - Left ventricle: The cavity size was mildly dilated. Wall   thickness was increased in a pattern of mild LVH. Systolic   function was normal. The estimated ejection fraction was in the   range of 50% to 55%. Wall motion was normal; there were no   regional wall motion abnormalities. Left ventricular diastolic   function parameters were normal. - Aortic valve: There was mild to moderate regurgitation. - Mitral valve: Calcified annulus. There was mild regurgitation. - Left atrium: The atrium was severely dilated. - Tricuspid valve: There was moderate regurgitation. - Pulmonary arteries: Systolic pressure was mildly increased. PA   peak pressure: 41 mm Hg (S).  Impressions:  - Normal LV systolic function; mild LVE and LVH; mild to moderate   AI; mild MR; severe LAE; moderate TR; mild pulmonary   hypertension.  Echo 03/26/19: INTERPRETATION ---------------------------------------------------------------   NORMAL LEFT VENTRICULAR SYSTOLIC FUNCTION  NORMAL LA PRESSURES WITH DIASTOLIC DYSFUNCTION  NORMAL RIGHT VENTRICULAR SYSTOLIC FUNCTION  VALVULAR REGURGITATION: MODERATE AR, MILD MR, TRIVIAL PR, MILD TR  NO VALVULAR STENOSIS  AR VENA CONTRACTA=0.6CM  MILD TO MODERATE TR  NO PRIOR STUDY FOR COMPARISON   Echo 04/11/20: ECHOCARDIOGRAPHIC DESCRIPTIONS -----------------------------------------------  AORTIC ROOT     Size: MILDLY DILATED  Dissection: INDETERM FOR DISSECTION   AORTIC VALVE   Leaflets: Tricuspid       Morphology: MILDLY THICKENED   Mobility: Fully Mobile   LEFT VENTRICLE                   Anterior: Normal     Size: MILDLY ENLARGED            Lateral: Normal  Contraction: Normal                 Septal: Normal  Closest EF: >55%(Estimated) Calc.EF: 56% (3D)   Apical: Normal   LV masses: No Masses              Inferior: Normal      LVH: MILD LVH               Posterior: Normal  LV GLS(LOL): -18.0%  LV GLS(AVG): -16.6% Normal Range [ <= -16]  Dias.FxClass: N/A   MITRAL VALVE   Leaflets: Normal         Mobility: Fully mobile  Morphology: Normal   LEFT ATRIUM     Size: MODERATELY ENLARGED   LA masses: No masses        Normal IAS   MAIN PA     Size: DILATED   PULMONIC VALVE  Morphology: Normal   Mobility: Fully Mobile   RIGHT VENTRICLE     Size: Normal          Free wall: Normal  Contraction: Normal          RV masses: No Masses     TAPSE:  2.1 cm, Normal Range [>=  1.6 cm]    RV Note: ANNULAR VELOCITY=10cm/s   TRICUSPID VALVE   Leaflets:Normal         Mobility: Fully mobile  Morphology: Normal   RIGHT ATRIUM     Size: MODERATELY ENLARGED    RA Other: None   RA masses: No masses   PERICARDIUM     Fluid: No effusion   INFERIOR  VENACAVA     Size: DILATED  Normal respiratory collapse   DOPPLER ECHO and OTHER SPECIAL PROCEDURES ------------------------------------   Aortic: MODERATE AR      No AS    Mitral: MILD MR        No MS   MV Inflow E Vel.= 70.0 cm/s MV Annulus E'Vel.= 8.5 cm/s E/E'Ratio= 8   Tricuspid: MILD TR        No TS       2.9 m/s peak TR vel  41 mmHg peak RV pressure   Pulmonary: TRIVIAL PR       No PS    Other:   INTERPRETATION ---------------------------------------------------------------  NORMAL LEFT VENTRICULAR SYSTOLIC FUNCTION WITH MILD LVH  NORMAL RIGHT VENTRICULAR SYSTOLIC FUNCTION  VALVULAR REGURGITATION: MODERATE AR, MILD MR, TRIVIAL PR, MILD TR  NO VALVULAR STENOSIS  MILDLY DILATED LEFT VENTRICLE SUGGESTS POSSIBILITY THAT ECHOCARDIOGRAPHY  UNDERESTIMATES AORTIC REGURGITATION SEVERITY.  AR VENA CONTRACTA=0.6cm  3D acquisition and reconstructions were performed as part of this  examination to more accurately quantify the effects of moderate or greater  valve regurgitation. (post-processing on an Independent workstation).   Compared with prior Echo study on 03/26/2019: NO SIGNIFICANT CHANGES.     ASSESSMENT AND PLAN:  1. Atrial fibriillation with RVR. Prior history of atrial flutter and atrial tachycardia ablation in 2015. On anticoagulation with Eliquis 5 mg bid. [place] vasc score of 3. On  Toprol XL dose. S/p TEE guided DCCV but remained in normal rhythm for only about a week. Failed Tikosyn due to QT prolongation. Now s/p Afib ablation at [facility] on 10/15/17. Recurrent Afib in June 2020 with repeat DCCV. Recommend long term anticoagulation. In NSR today with PACs.   2. Coronary disease status post CABG in 2005. He is without chest pain. He had a normal Myoview study September 2015. Low risk ETT in August 2018. Repeat nuclear  study at [facility] 2019 showed no ischemia. EF 44%.   3. Chronic Aortic insufficiency.   Pulse pressure normal. No symptoms. Repeat Echo in August showed no change. LV function still good. Recommend follow up Echo yearly.  4. 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   5. Atrial flutter s/p ablation.   6. OSA- now on CPAP. Followed by Dr. Radford Pax.  7. Hyperlipidemia on statin. Last LDL 75.   8. HTN BP is under good control  9. Left subclavian stenosis. Asymptomatic.     Disposition: FU with me in 6 months  Signed, Roby Spalla Martinique, MD  08/29/2020 9:01 AM    [address]

## 2020-08-29 ENCOUNTER — Other Ambulatory Visit: Payer: Self-pay

## 2020-08-29 ENCOUNTER — Ambulatory Visit: Payer: Medicare HMO | Admitting: Cardiology

## 2020-08-29 ENCOUNTER — Encounter: Payer: Self-pay | Admitting: Cardiology

## 2020-08-29 VITALS — BP 130/62 | HR 60 | Ht 73.0 in | Wt 210.4 lb

## 2020-08-29 DIAGNOSIS — E7800 Pure hypercholesterolemia, unspecified: Secondary | ICD-10-CM | POA: Diagnosis not present

## 2020-08-29 DIAGNOSIS — I10 Essential (primary) hypertension: Secondary | ICD-10-CM

## 2020-08-29 DIAGNOSIS — I2510 Atherosclerotic heart disease of native coronary artery without angina pectoris: Secondary | ICD-10-CM

## 2020-08-29 DIAGNOSIS — I4892 Unspecified atrial flutter: Secondary | ICD-10-CM

## 2020-08-29 DIAGNOSIS — I351 Nonrheumatic aortic (valve) insufficiency: Secondary | ICD-10-CM | POA: Diagnosis not present

## 2020-08-29 DIAGNOSIS — Z951 Presence of aortocoronary bypass graft: Secondary | ICD-10-CM

## 2020-09-15 ENCOUNTER — Other Ambulatory Visit: Payer: Self-pay | Admitting: Cardiology

## 2020-09-15 NOTE — Telephone Encounter (Signed)
76m, 95.4kg, scr 0.79 02/26/20, lovw/jordan 08/29/20

## 2020-09-23 ENCOUNTER — Encounter: Payer: Self-pay | Admitting: Cardiology

## 2020-09-23 ENCOUNTER — Other Ambulatory Visit: Payer: Self-pay

## 2020-09-23 ENCOUNTER — Telehealth (INDEPENDENT_AMBULATORY_CARE_PROVIDER_SITE_OTHER): Payer: Medicare HMO | Admitting: Cardiology

## 2020-09-23 VITALS — BP 159/70 | HR 67 | Ht 73.0 in | Wt 205.0 lb

## 2020-09-23 DIAGNOSIS — G4733 Obstructive sleep apnea (adult) (pediatric): Secondary | ICD-10-CM

## 2020-09-23 DIAGNOSIS — I10 Essential (primary) hypertension: Secondary | ICD-10-CM

## 2020-09-23 MED ORDER — FUROSEMIDE 20 MG PO TABS: 20.0000 mg | ORAL_TABLET | Freq: Two times a day (BID) | ORAL | 3 refills | Status: DC

## 2020-09-23 NOTE — Progress Notes (Signed)
Virtual Visit via Video Note   This visit type was conducted due to national recommendations for restrictions regarding the COVID-19 Pandemic (e.g. social distancing) in an effort to limit this patient's exposure and mitigate transmission in our community.  Due to his co-morbid illnesses, this patient is at least at moderate risk for complications without adequate follow up.  This format is felt to be most appropriate for this patient at this time.  All issues noted in this document were discussed and addressed.  A limited physical exam was performed with this format.  Please refer to the patient's chart for his consent to telehealth for [facility].  Evaluation Performed:  Follow-up visit  This visit type was conducted due to national recommendations for restrictions regarding the COVID-19 Pandemic (e.g. social distancing).  This format is felt to be most appropriate for this patient at this time.  All issues noted in this document were discussed and addressed.  No physical exam was performed (except for noted visual exam findings with Video Visits).  Please refer to the patient's chart (MyChart message for video visits and phone note for telephone visits) for the patient's consent to telehealth for [facility].  Date:  09/23/2020   ID:  [name] [name], DOB 07-Jun-1951, MRN [id number]  Patient Location:  Home  Provider location:   [place]  PCP:  [name], MD  Cardiologist:  [name], MD Sleep Medicine:  Fransico Him, MD Electrophysiologist:  None   Chief Complaint:  OSA, HTN  History of Present Illness:    [name] [name] is a 70 y.o. male who presents via audio/video conferencing for a telehealth visit today.    [name] [name] is a 70 y.o. male with a hx of moderate OSA with an AHI of 16.9 events per hour mainly occurring in the non supine position and during NREM sleep. He is on CPAP at 10cm H2O.  He is doing well with his CPAP device and thinks that he has  gotten used to it.  He tolerates the nasal mask and feels the pressure is adequate.  Since going on CPAP he feels rested in the am and has no significant daytime sleepiness but occasionally will fall asleep on the couch at night watching TV.  He has some problems with mouth dryness but no nasal dryness or nasal congestion.  He does not think that he snores.    The patient does have symptoms concerning for COVID-19 infection (fever, chills, cough, or new shortness of breath).   Prior CV studies:   The following studies were reviewed today:  PAP compliance download from [facility]  Past Medical History:  Diagnosis Date  . Chicken pox   . Coronary artery disease    Severe three-vessel coronary artery disease with ejection fraction of 65%  . GERD (gastroesophageal reflux disease)   . History of gout   . History of psoriasis   . Hypercholesterolemia   . Hypertension   . OSA (obstructive sleep apnea)    Moderate with AHI of 16.9/hr now on CPAP at 10cm H2O  . Psoriatic arthritis [facility])    Past Surgical History:  Procedure Laterality Date  . ATRIAL FLUTTER ABLATION  03/30/2014  . ATRIAL FLUTTER ABLATION N/A 03/30/2014   Procedure: ATRIAL FLUTTER ABLATION;  Surgeon: Coralyn Mark, MD;  Location: [place] CATH LAB;  Service: Cardiovascular;  Laterality: N/A;  . CARDIAC CATHETERIZATION  09/2003   Ejection fraction was estimated at 65%.  . CARDIOVERSION N/A 03/15/2014  Procedure: CARDIOVERSION;  Surgeon: Lelon Perla, MD;  Location: [facility] ENDOSCOPY;  Service: Cardiovascular;  Laterality: N/A;  . CARDIOVERSION N/A 07/17/2017   Procedure: CARDIOVERSION;  Surgeon: Sueanne Margarita, MD;  Location: [place];  Service: Cardiovascular;  Laterality: N/A;  . CORONARY ARTERY BYPASS GRAFT  09/2003   [place]-lad,svg-diag,svg-om/distal LCX,svg-pda  . TEE WITHOUT CARDIOVERSION N/A 03/15/2014   Procedure: TRANSESOPHAGEAL ECHOCARDIOGRAM (TEE);  Surgeon: Lelon Perla, MD;  Location: [facility] ENDOSCOPY;  Service:  Cardiovascular;  Laterality: N/A;  . TEE WITHOUT CARDIOVERSION N/A 07/17/2017   Procedure: TRANSESOPHAGEAL ECHOCARDIOGRAM (TEE);  Surgeon: Sueanne Margarita, MD;  Location: [facility] ENDOSCOPY;  Service: Cardiovascular;  Laterality: N/A;  . TONSILLECTOMY  1950's     Current Meds  Medication Sig  . atorvastatin (LIPITOR) 80 MG tablet TAKE 1 TABLET BY MOUTH EVERY DAY  . azelastine (ASTELIN) 0.1 % nasal spray Place 1 spray into both nostrils 2 (two) times daily. Use in each nostril as directed (Patient taking differently: Place 1 spray into both nostrils as needed. Use in each nostril as directed)  . cholecalciferol (VITAMIN D) 1000 UNITS tablet Take 1,000 Units by mouth daily.  . Coenzyme Q10 (CO Q 10 PO) Take 100 mg by mouth daily.   . colchicine 0.6 MG tablet TAKE 1 TABLET BY MOUTH TWICE A DAY (Patient taking differently: Take 0.6 mg by mouth daily as needed (gout).)  . ELIQUIS 5 MG TABS tablet TAKE 1 TABLET BY MOUTH TWICE A DAY  . furosemide (LASIX) 20 MG tablet Take 1 tablet (20 mg total) by mouth 2 (two) times daily.  Marland Kitchen losartan (COZAAR) 50 MG tablet Take 1 tablet (50 mg total) by mouth daily.  . methotrexate 2.5 MG tablet Take 2.5 mg by mouth once a week. Caution:Chemotherapy. Protect from light.  . metoprolol succinate (TOPROL-XL) 25 MG 24 hr tablet Take 1 tablet (25 mg total) by mouth daily.  . multivitamin (THERAGRAN) per tablet Take 1 tablet by mouth daily.  . nitroGLYCERIN (NITROSTAT) 0.4 MG SL tablet PLACE 1 TABLET UNDER THE TONGUE EVERY 5 MINUTES AS NEEDED FOR CHEST PAIN.  Marland Kitchen potassium chloride SA (K-DUR,KLOR-CON) 20 MEQ tablet Take 1 tablet (20 mEq total) by mouth daily.  . tadalafil (CIALIS) 20 MG tablet Take one tablet by mouth every other day as needed for erectile dysfunction     Allergies:   Patient has no known allergies.   Social History   Tobacco Use  . Smoking status: Former Smoker    Packs/day: 0.50    Years: 5.00    Pack years: 2.50    Types: Cigarettes  . Smokeless  tobacco: Never Used  . Tobacco comment: "quit smoking cigarettes in the late 1970's"  Vaping Use  . Vaping Use: Never used  Substance Use Topics  . Alcohol use: Yes    Alcohol/week: 4.0 standard drinks    Types: 2 Cans of beer, 2 Shots of liquor per week    Comment: 1-2  drinks a week  . Drug use: No     Family Hx: The patient's family history includes Cancer in his mother; Heart disease in his mother; Stroke in his mother.  ROS:   Please see the history of present illness.     All other systems reviewed and are negative.   Labs/Other Tests and Data Reviewed:    Recent Labs: 11/30/2019: ALT 22 02/26/2020: BUN 10; Creat 0.79; Hemoglobin 14.8; Platelets 151; Potassium 4.3; Sodium 141; TSH 0.82   Recent Lipid Panel Lab Results  Component  Value Date/Time   CHOL 163 11/30/2019 08:27 AM   TRIG 74 11/30/2019 08:27 AM   HDL 74 11/30/2019 08:27 AM   CHOLHDL 2.2 11/30/2019 08:27 AM   CHOLHDL 3 11/23/2015 08:37 AM   LDLCALC 75 11/30/2019 08:27 AM    Wt Readings from Last 3 Encounters:  09/23/20 205 lb (93 kg)  08/29/20 210 lb 6.4 oz (95.4 kg)  03/21/20 208 lb (94.3 kg)     Objective:    Vital Signs:  BP (!) 159/70   Pulse 67   Ht 6\' 1"  (1.854 m)   Wt 205 lb (93 kg)   BMI 27.05 kg/m   Well nourished, well developed male in no acute distress. Well appearing, alert and conversant, regular work of breathing,  good skin color  Eyes- anicteric mouth- oral mucosa is pink  neuro- grossly intact skin- no apparent rash or lesions or cyanosis  ASSESSMENT & PLAN:    1.  OSA - The patient is tolerating PAP therapy well without any problems. The PAP download was reviewed today and showed an AHI of 1.7/hr on 10 cm H2O with 90% compliance in using more than 4 hours nightly.  The patient has been using and benefiting from PAP use and will continue to benefit from therapy.  -I encouraged him to use his chin strap to help with mouth drynes -He would like to try a nasal pillow mask  2.   HTN -BP is borderline controlled on exam today>>he just took his BP meds and has been controlled at him -continue Toprol XL 25mg  daily and Losartan 50mg  daily  COVID-19 Education: The signs and symptoms of COVID-19 were discussed with the patient and how to seek care for testing (follow up with PCP or arrange E-visit).  The importance of social distancing was discussed today.  Patient Risk:   After full review of this patient's clinical status, I feel that they are at least moderate risk at this time.  Time:   Today, I have spent 20 minutes on telemedicine discussing medical problems including OSA< HTN and reviewing patient's chart including PAP compliance download from [place].  Medication Adjustments/Labs and Tests Ordered: Current medicines are reviewed at length with the patient today.  Concerns regarding medicines are outlined above.  Tests Ordered: No orders of the defined types were placed in this encounter.  Medication Changes: No orders of the defined types were placed in this encounter.   Disposition:  Follow up in 1 year(s)  Signed, Fransico Him, MD  09/23/2020 10:03 AM    [facility]

## 2020-09-27 ENCOUNTER — Telehealth: Payer: Self-pay | Admitting: *Deleted

## 2020-09-27 DIAGNOSIS — G4733 Obstructive sleep apnea (adult) (pediatric): Secondary | ICD-10-CM

## 2020-09-27 NOTE — Telephone Encounter (Signed)
Order placed to [employer] via community message 

## 2020-09-27 NOTE — Telephone Encounter (Signed)
-----   Message from Sueanne Margarita, MD sent at 09/23/2020 10:08 AM EST ----- Order PAP supplies and a nasal pillow mask along with chin strap.  Followup with me in 1 year

## 2020-10-19 ENCOUNTER — Emergency Department (HOSPITAL_COMMUNITY)
Admission: EM | Admit: 2020-10-19 | Discharge: 2020-10-19 | Disposition: A | Discharge status: Home / Self Care | Payer: Medicare HMO | Attending: Emergency Medicine | Admitting: Emergency Medicine

## 2020-10-19 DIAGNOSIS — Z79899 Other long term (current) drug therapy: Secondary | ICD-10-CM | POA: Diagnosis not present

## 2020-10-19 DIAGNOSIS — I10 Essential (primary) hypertension: Secondary | ICD-10-CM | POA: Diagnosis not present

## 2020-10-19 DIAGNOSIS — I2510 Atherosclerotic heart disease of native coronary artery without angina pectoris: Secondary | ICD-10-CM | POA: Diagnosis not present

## 2020-10-19 DIAGNOSIS — Z951 Presence of aortocoronary bypass graft: Secondary | ICD-10-CM | POA: Diagnosis not present

## 2020-10-19 DIAGNOSIS — I471 Supraventricular tachycardia: Secondary | ICD-10-CM | POA: Insufficient documentation

## 2020-10-19 DIAGNOSIS — Z87891 Personal history of nicotine dependence: Secondary | ICD-10-CM | POA: Insufficient documentation

## 2020-10-19 DIAGNOSIS — Z7901 Long term (current) use of anticoagulants: Secondary | ICD-10-CM | POA: Diagnosis not present

## 2020-10-19 DIAGNOSIS — R000 Tachycardia, unspecified: Secondary | ICD-10-CM | POA: Diagnosis not present

## 2020-10-19 LAB — CBC
HCT: 47 % (ref 39.0–52.0)
Hemoglobin: 16.1 g/dL (ref 13.0–17.0)
MCH: 31.9 pg (ref 26.0–34.0)
MCHC: 34.3 g/dL (ref 30.0–36.0)
MCV: 93.1 fL (ref 80.0–100.0)
Platelets: 229 10*3/uL (ref 150–400)
RBC: 5.05 MIL/uL (ref 4.22–5.81)
RDW: 12.2 % (ref 11.5–15.5)
WBC: 11 10*3/uL — ABNORMAL HIGH (ref 4.0–10.5)
nRBC: 0 % (ref 0.0–0.2)

## 2020-10-19 LAB — BASIC METABOLIC PANEL
Anion gap: 11 (ref 5–15)
BUN: 10 mg/dL (ref 8–23)
CO2: 25 mmol/L (ref 22–32)
Calcium: 9.4 mg/dL (ref 8.9–10.3)
Chloride: 103 mmol/L (ref 98–111)
Creatinine, Ser: 0.95 mg/dL (ref 0.61–1.24)
GFR, Estimated: >60 mL/min (ref >60)
Glucose, Bld: 115 mg/dL — ABNORMAL HIGH (ref 70–99)
Potassium: 4 mmol/L (ref 3.5–5.1)
Sodium: 139 mmol/L (ref 135–145)

## 2020-10-19 LAB — MAGNESIUM: Magnesium: 1.8 mg/dL (ref 1.7–2.4)

## 2020-10-19 MED ORDER — ADENOSINE 6 MG/2ML IV SOLN
INTRAVENOUS | Status: AC
  Administered 2020-10-19: 6 mg via INTRAVENOUS
  Filled 2020-10-19: qty 4

## 2020-10-19 MED ORDER — METOPROLOL TARTRATE 25 MG PO TABS: 12.5000 mg | ORAL_TABLET | Freq: Two times a day (BID) | ORAL | 0 refills | Status: DC | PRN

## 2020-10-19 MED ORDER — ETOMIDATE 2 MG/ML IV SOLN: 7.5000 mg | Freq: Once | INTRAVENOUS | Status: DC

## 2020-10-19 MED ORDER — ADENOSINE 6 MG/2ML IV SOLN
6.0000 mg | Freq: Once | INTRAVENOUS | Status: AC
  Filled 2020-10-19: qty 2

## 2020-10-19 NOTE — ED Triage Notes (Signed)
Pt here from home with c/o sob with a HR of 196 hx of same , has had 2 ablation

## 2020-10-19 NOTE — ED Notes (Signed)
Patient verbalizes understanding of discharge instructions. Opportunity for questioning and answers were provided. Armband removed by staff, pt discharged from ED.  

## 2020-10-19 NOTE — ED Provider Notes (Signed)
[place] EMERGENCY DEPARTMENT Provider Note   CSN: [id number] Arrival date & time: 10/19/20  1411     History No chief complaint on file.   [name] [name] is a 70 y.o. male.  HPI Patient presents with tachycardia.  No chest pain.  States he was able to work out this morning without difficulty then around 10 AM began to feel his heart go fast.  History of atrial fib/flutter with previous ablation.  Last episode was around a year ago.  He is on Eliquis and has been taking consistently.  No lightheadedness or dizziness.  States he can feel his heart going fast however.  States he called his doctor at [facility] was called to come into the ER.    Past Medical History:  Diagnosis Date  . Chicken pox   . Coronary artery disease    Severe three-vessel coronary artery disease with ejection fraction of 65%  . GERD (gastroesophageal reflux disease)   . History of gout   . History of psoriasis   . Hypercholesterolemia   . Hypertension   . OSA (obstructive sleep apnea)    Moderate with AHI of 16.9/hr now on CPAP at 10cm H2O  . Psoriatic arthritis [facility])     Patient Active Problem List   Diagnosis Date Noted  . Psoriatic arthritis ([place])   . History of psoriasis   . History of gout   . GERD (gastroesophageal reflux disease)   . Chicken pox   . Persistent atrial fibrillation ([place])   . Erectile dysfunction 05/06/2017  . Overweight (BMI 25.0-29.9) 08/06/2016  . OSA (obstructive sleep apnea) 08/06/2014  . Snoring 05/22/2014  . Hypertension   . Atrial flutter ([place]) 03/14/2014  . Psoriasis 02/27/2013  . Gout 02/27/2013  . S/P CABG (coronary artery bypass graft) 06/29/2011  . Coronary artery disease   . Hypercholesterolemia     Past Surgical History:  Procedure Laterality Date  . ATRIAL FLUTTER ABLATION  03/30/2014  . ATRIAL FLUTTER ABLATION N/A 03/30/2014   Procedure: ATRIAL FLUTTER ABLATION;  Surgeon: Coralyn Mark, MD;  Location: [place] CATH LAB;  Service:  Cardiovascular;  Laterality: N/A;  . CARDIAC CATHETERIZATION  09/2003   Ejection fraction was estimated at 65%.  . CARDIOVERSION N/A 03/15/2014   Procedure: CARDIOVERSION;  Surgeon: Lelon Perla, MD;  Location: [facility] ENDOSCOPY;  Service: Cardiovascular;  Laterality: N/A;  . CARDIOVERSION N/A 07/17/2017   Procedure: CARDIOVERSION;  Surgeon: Sueanne Margarita, MD;  Location: [place];  Service: Cardiovascular;  Laterality: N/A;  . CORONARY ARTERY BYPASS GRAFT  09/2003   [place]ima-lad,svg-diag,svg-om/distal LCX,svg-pda  . TEE WITHOUT CARDIOVERSION N/A 03/15/2014   Procedure: TRANSESOPHAGEAL ECHOCARDIOGRAM (TEE);  Surgeon: Lelon Perla, MD;  Location: [facility] ENDOSCOPY;  Service: Cardiovascular;  Laterality: N/A;  . TEE WITHOUT CARDIOVERSION N/A 07/17/2017   Procedure: TRANSESOPHAGEAL ECHOCARDIOGRAM (TEE);  Surgeon: Sueanne Margarita, MD;  Location: [facility] ENDOSCOPY;  Service: Cardiovascular;  Laterality: N/A;  . TONSILLECTOMY  1950's       Family History  Problem Relation Age of Onset  . Heart disease Mother        cabg  . Cancer Mother        breast  . Stroke Mother     Social History   Tobacco Use  . Smoking status: Former Smoker    Packs/day: 0.50    Years: 5.00    Pack years: 2.50    Types: Cigarettes  . Smokeless tobacco: Never Used  . Tobacco comment: "quit smoking  cigarettes in the late 1970's"  Vaping Use  . Vaping Use: Never used  Substance Use Topics  . Alcohol use: Yes    Alcohol/week: 4.0 standard drinks    Types: 2 Cans of beer, 2 Shots of liquor per week    Comment: 1-2  drinks a week  . Drug use: No    Home Medications Prior to Admission medications   Medication Sig Start Date End Date Taking? Authorizing Provider  atorvastatin (LIPITOR) 80 MG tablet TAKE 1 TABLET BY MOUTH EVERY DAY 09/15/20   Martinique, Peter M, MD  azelastine (ASTELIN) 0.1 % nasal spray Place 1 spray into both nostrils 2 (two) times daily. Use in each nostril as directed Patient taking differently:  Place 1 spray into both nostrils as needed. Use in each nostril as directed 04/06/19   Burchette, Alinda Sierras, MD  cholecalciferol (VITAMIN D) 1000 UNITS tablet Take 1,000 Units by mouth daily.    [provider]  Coenzyme Q10 (CO Q 10 PO) Take 100 mg by mouth daily.     [provider]  colchicine 0.6 MG tablet TAKE 1 TABLET BY MOUTH TWICE A DAY Patient taking differently: Take 0.6 mg by mouth daily as needed (gout). 08/22/20   Burchette, Alinda Sierras, MD  ELIQUIS 5 MG TABS tablet TAKE 1 TABLET BY MOUTH TWICE A DAY 09/15/20   Martinique, Peter M, MD  furosemide (LASIX) 20 MG tablet Take 1 tablet (20 mg total) by mouth 2 (two) times daily. 09/23/20   Martinique, Peter M, MD  losartan (COZAAR) 50 MG tablet Take 1 tablet (50 mg total) by mouth daily. 12/24/19   Martinique, Peter M, MD  methotrexate 2.5 MG tablet Take 2.5 mg by mouth once a week. Caution:Chemotherapy. Protect from light.    [provider]  metoprolol succinate (TOPROL-XL) 25 MG 24 hr tablet Take 1 tablet (25 mg total) by mouth daily. 10/29/19   Martinique, Peter M, MD  multivitamin [facility]) per tablet Take 1 tablet by mouth daily.    [provider]  nitroGLYCERIN (NITROSTAT) 0.4 MG SL tablet PLACE 1 TABLET UNDER THE TONGUE EVERY 5 MINUTES AS NEEDED FOR CHEST PAIN. 12/02/17   Martinique, Peter M, MD  potassium chloride SA (K-DUR,KLOR-CON) 20 MEQ tablet Take 1 tablet (20 mEq total) by mouth daily. 07/08/17   Martinique, Peter M, MD  tadalafil (CIALIS) 20 MG tablet Take one tablet by mouth every other day as needed for erectile dysfunction 02/26/20   Burchette, Alinda Sierras, MD    Allergies    Patient has no known allergies.  Review of Systems   Review of Systems  Constitutional: Negative for appetite change, fatigue and fever.  HENT: Negative for congestion.   Respiratory: Negative for shortness of breath.   Cardiovascular: Positive for palpitations. Negative for chest pain.  Gastrointestinal: Negative for abdominal pain.  Genitourinary:  Negative for flank pain.  Musculoskeletal: Negative for back pain.  Skin: Negative for rash.  Neurological: Negative for weakness.  Psychiatric/Behavioral: Negative for confusion.    Physical Exam Updated Vital Signs BP 112/60   Pulse 82   Temp 97.8 F (36.6 C)   Resp (!) 29   SpO2 99%   Physical Exam Vitals and nursing note reviewed.  HENT:     Head: Atraumatic.  Eyes:     Pupils: Pupils are equal, round, and reactive to light.  Cardiovascular:     Rate and Rhythm: Regular rhythm. Tachycardia present.  Pulmonary:     Breath sounds: No wheezing  or rhonchi.  Abdominal:     Tenderness: There is no abdominal tenderness.  Musculoskeletal:        General: No tenderness.     Cervical back: Neck supple.  Skin:    General: Skin is warm.     Capillary Refill: Capillary refill takes less than 2 seconds.  Neurological:     Mental Status: He is alert and oriented to person, place, and time.  Psychiatric:        Mood and Affect: Mood normal.     ED Results / Procedures / Treatments   Labs (all labs ordered are listed, but only abnormal results are displayed) Labs Reviewed  CBC - Abnormal; Notable for the following components:      Result Value   WBC 11.0 (*)    All other components within normal limits  BASIC METABOLIC PANEL  MAGNESIUM    EKG EKG Interpretation  Date/Time:  Wednesday October 19 2020 14:12:29 EDT Ventricular Rate:  196 PR Interval:    QRS Duration: 88 QT Interval:  234 QTC Calculation: 422 R Axis:   34 Text Interpretation: Supraventricular tachycardia vs afib/flutter Cannot rule out Anterior infarct , age undetermined Marked ST abnormality, possible inferolateral subendocardial injury Abnormal ECG Confirmed by Davonna Belling [id number]) on 10/19/2020 2:27:14 PM   Radiology No results found.  Procedures Procedures   Medications Ordered in ED Medications  etomidate (AMIDATE) injection 7.5 mg (7.5 mg Intravenous Not Given 10/19/20 1532)  adenosine  (ADENOCARD) 6 MG/2ML injection 6 mg (6 mg Intravenous Given 10/19/20 1451)    ED Course  I have reviewed the triage vital signs and the nursing notes.  Pertinent labs & imaging results that were available during my care of the patient were reviewed by me and considered in my medical decision making (see chart for details).    MDM Rules/Calculators/A&P                          Patient presented with tachycardia.  Heart rate running rather fast for atrial fib/flutter.  Thought to be more likely in SVT.  Adenosine given but had a brief conversion to close to 10 beats of ventricular tachycardia.  Then came back in with the SVT but with a right bundle branch block.  Patient's blood pressures been mildly low throughout.  No chest pain.  Decision made for cardioversion since patient has been anticoagulated.  However patient converted when he was being set up.  Lab work will be done but if negative likely discharge home.  Can follow-up with his cardiologist Final Clinical Impression(s) / ED Diagnoses Final diagnoses:  SVT (supraventricular tachycardia) ([place])    Rx / DC Orders ED Discharge Orders    None       Davonna Belling, MD 10/19/20 1536

## 2020-10-21 ENCOUNTER — Encounter: Payer: Self-pay | Admitting: Family Medicine

## 2020-10-27 ENCOUNTER — Other Ambulatory Visit: Payer: Self-pay | Admitting: Student

## 2020-10-27 DIAGNOSIS — I471 Supraventricular tachycardia: Secondary | ICD-10-CM

## 2020-10-27 NOTE — Progress Notes (Signed)
At the request of Dr. Ellyn Hack, ordered monitor for further evaluation of SVT after recent ED visit on 10/19/2020. Please see note from this ED visit for more information.  Darreld Mclean, PA-C 10/27/2020 9:05 PM

## 2020-11-03 DIAGNOSIS — H5203 Hypermetropia, bilateral: Secondary | ICD-10-CM | POA: Diagnosis not present

## 2020-11-04 ENCOUNTER — Ambulatory Visit (INDEPENDENT_AMBULATORY_CARE_PROVIDER_SITE_OTHER): Payer: Medicare HMO

## 2020-11-04 DIAGNOSIS — I471 Supraventricular tachycardia: Secondary | ICD-10-CM | POA: Diagnosis not present

## 2020-11-16 ENCOUNTER — Encounter (HOSPITAL_BASED_OUTPATIENT_CLINIC_OR_DEPARTMENT_OTHER): Payer: Self-pay | Admitting: *Deleted

## 2020-11-16 ENCOUNTER — Emergency Department (HOSPITAL_BASED_OUTPATIENT_CLINIC_OR_DEPARTMENT_OTHER): Payer: Medicare HMO

## 2020-11-16 ENCOUNTER — Other Ambulatory Visit: Payer: Self-pay

## 2020-11-16 ENCOUNTER — Emergency Department (HOSPITAL_BASED_OUTPATIENT_CLINIC_OR_DEPARTMENT_OTHER)
Admission: EM | Admit: 2020-11-16 | Discharge: 2020-11-16 | Disposition: A | Discharge status: Home / Self Care | Payer: Medicare HMO | Attending: Emergency Medicine | Admitting: Emergency Medicine

## 2020-11-16 DIAGNOSIS — I10 Essential (primary) hypertension: Secondary | ICD-10-CM | POA: Insufficient documentation

## 2020-11-16 DIAGNOSIS — S5001XA Contusion of right elbow, initial encounter: Secondary | ICD-10-CM | POA: Diagnosis not present

## 2020-11-16 DIAGNOSIS — I2510 Atherosclerotic heart disease of native coronary artery without angina pectoris: Secondary | ICD-10-CM | POA: Diagnosis not present

## 2020-11-16 DIAGNOSIS — W010XXA Fall on same level from slipping, tripping and stumbling without subsequent striking against object, initial encounter: Secondary | ICD-10-CM | POA: Insufficient documentation

## 2020-11-16 DIAGNOSIS — Z7901 Long term (current) use of anticoagulants: Secondary | ICD-10-CM | POA: Diagnosis not present

## 2020-11-16 DIAGNOSIS — S27321A Contusion of lung, unilateral, initial encounter: Secondary | ICD-10-CM | POA: Insufficient documentation

## 2020-11-16 DIAGNOSIS — Z951 Presence of aortocoronary bypass graft: Secondary | ICD-10-CM | POA: Insufficient documentation

## 2020-11-16 DIAGNOSIS — Z79899 Other long term (current) drug therapy: Secondary | ICD-10-CM | POA: Insufficient documentation

## 2020-11-16 DIAGNOSIS — S2241XA Multiple fractures of ribs, right side, initial encounter for closed fracture: Secondary | ICD-10-CM

## 2020-11-16 DIAGNOSIS — Z87891 Personal history of nicotine dependence: Secondary | ICD-10-CM | POA: Insufficient documentation

## 2020-11-16 DIAGNOSIS — Y92007 Garden or yard of unspecified non-institutional (private) residence as the place of occurrence of the external cause: Secondary | ICD-10-CM | POA: Diagnosis not present

## 2020-11-16 DIAGNOSIS — S299XXA Unspecified injury of thorax, initial encounter: Secondary | ICD-10-CM | POA: Diagnosis present

## 2020-11-16 MED ORDER — OXYCODONE HCL 5 MG PO TABS: 5.0000 mg | ORAL_TABLET | Freq: Four times a day (QID) | ORAL | 0 refills | Status: DC | PRN

## 2020-11-16 MED ORDER — ACETAMINOPHEN 325 MG PO TABS: 650.0000 mg | ORAL_TABLET | Freq: Four times a day (QID) | ORAL | 0 refills | Status: AC | PRN

## 2020-11-16 MED ORDER — LIDOCAINE 5 % EX PTCH: 1.0000 | MEDICATED_PATCH | CUTANEOUS | 0 refills | Status: AC

## 2020-11-16 NOTE — ED Triage Notes (Addendum)
Patient tripped over a dog while he was at his neighbor's backyard today.  Pt stated that he did not hit his head.  He did complaint of upper back, right ribs and right elbow pain.

## 2020-11-16 NOTE — ED Notes (Signed)
ED Provider at bedside. 

## 2020-11-16 NOTE — Discharge Instructions (Signed)
Use your incentive spirometer 4 times per day for the next 2 weeks.

## 2020-11-16 NOTE — ED Provider Notes (Signed)
[place] EMERGENCY DEPT Provider Note   CSN: [id number] Arrival date & time: 11/16/20  1856     History Chief Complaint  Patient presents with  . Fall    [name] [name] is a 70 y.o. male with a history of A. fib on Eliquis presenting to ED with mechanical fall.  The patient reports that he got tangled up in his neighbors dog today, and fell backwards from standing.  He struck his elbow on the ground, and said that the right side of his back landed against a 2 x 4.  He is describing pain near his right shoulder blade.  It is mildly worse with inspiration with arm movement.  The pain is mild to moderate in intensity.  He is adamant that he did not strike his head, has no headache, did not lose consciousness.  He did take his morning Eliquis.  He is here with his wife at the bedside.  He denies any other injuries.  HPI     Past Medical History:  Diagnosis Date  . Chicken pox   . Coronary artery disease    Severe three-vessel coronary artery disease with ejection fraction of 65%  . GERD (gastroesophageal reflux disease)   . History of gout   . History of psoriasis   . Hypercholesterolemia   . Hypertension   . OSA (obstructive sleep apnea)    Moderate with AHI of 16.9/hr now on CPAP at 10cm H2O  . Psoriatic arthritis [facility])     Patient Active Problem List   Diagnosis Date Noted  . Psoriatic arthritis ([place])   . History of psoriasis   . History of gout   . GERD (gastroesophageal reflux disease)   . Chicken pox   . Persistent atrial fibrillation ([place])   . Erectile dysfunction 05/06/2017  . Overweight (BMI 25.0-29.9) 08/06/2016  . OSA (obstructive sleep apnea) 08/06/2014  . Snoring 05/22/2014  . Hypertension   . Atrial flutter ([place]) 03/14/2014  . Psoriasis 02/27/2013  . Gout 02/27/2013  . S/P CABG (coronary artery bypass graft) 06/29/2011  . Coronary artery disease   . Hypercholesterolemia     Past Surgical History:  Procedure Laterality Date  .  ATRIAL FLUTTER ABLATION  03/30/2014  . ATRIAL FLUTTER ABLATION N/A 03/30/2014   Procedure: ATRIAL FLUTTER ABLATION;  Surgeon: Coralyn Mark, MD;  Location: [place] CATH LAB;  Service: Cardiovascular;  Laterality: N/A;  . CARDIAC CATHETERIZATION  09/2003   Ejection fraction was estimated at 65%.  . CARDIOVERSION N/A 03/15/2014   Procedure: CARDIOVERSION;  Surgeon: Lelon Perla, MD;  Location: [facility] ENDOSCOPY;  Service: Cardiovascular;  Laterality: N/A;  . CARDIOVERSION N/A 07/17/2017   Procedure: CARDIOVERSION;  Surgeon: Sueanne Margarita, MD;  Location: [place];  Service: Cardiovascular;  Laterality: N/A;  . CORONARY ARTERY BYPASS GRAFT  09/2003   [place]-lad,svg-diag,svg-om/distal LCX,svg-pda  . TEE WITHOUT CARDIOVERSION N/A 03/15/2014   Procedure: TRANSESOPHAGEAL ECHOCARDIOGRAM (TEE);  Surgeon: Lelon Perla, MD;  Location: [facility] ENDOSCOPY;  Service: Cardiovascular;  Laterality: N/A;  . TEE WITHOUT CARDIOVERSION N/A 07/17/2017   Procedure: TRANSESOPHAGEAL ECHOCARDIOGRAM (TEE);  Surgeon: Sueanne Margarita, MD;  Location: [facility] ENDOSCOPY;  Service: Cardiovascular;  Laterality: N/A;  . TONSILLECTOMY  1950's       Family History  Problem Relation Age of Onset  . Heart disease Mother        cabg  . Cancer Mother        breast  . Stroke Mother     Social History  Tobacco Use  . Smoking status: Former Smoker    Packs/day: 0.50    Years: 5.00    Pack years: 2.50    Types: Cigarettes  . Smokeless tobacco: Never Used  . Tobacco comment: "quit smoking cigarettes in the late 1970's"  Vaping Use  . Vaping Use: Never used  Substance Use Topics  . Alcohol use: Yes    Alcohol/week: 4.0 standard drinks    Types: 2 Cans of beer, 2 Shots of liquor per week    Comment: 1-2  drinks a week  . Drug use: No    Home Medications Prior to Admission medications   Medication Sig Start Date End Date Taking? Authorizing Provider  acetaminophen (TYLENOL) 325 MG tablet Take 2 tablets (650 mg total) by mouth  every 6 (six) hours as needed for up to 30 doses for mild pain or moderate pain. 11/16/20  Yes Wyvonnia Dusky, MD  atorvastatin (LIPITOR) 80 MG tablet TAKE 1 TABLET BY MOUTH EVERY DAY 09/15/20  Yes Martinique, Peter M, MD  cholecalciferol (VITAMIN D) 1000 UNITS tablet Take 1,000 Units by mouth daily.   Yes [provider]  Coenzyme Q10 (CO Q 10 PO) Take 100 mg by mouth daily.    Yes [provider]  colchicine 0.6 MG tablet TAKE 1 TABLET BY MOUTH TWICE A DAY Patient taking differently: Take 0.6 mg by mouth daily as needed (gout). 08/22/20  Yes Burchette, Alinda Sierras, MD  ELIQUIS 5 MG TABS tablet TAKE 1 TABLET BY MOUTH TWICE A DAY 09/15/20  Yes Martinique, Peter M, MD  furosemide (LASIX) 20 MG tablet Take 1 tablet (20 mg total) by mouth 2 (two) times daily. 09/23/20  Yes Martinique, Peter M, MD  lidocaine (LIDODERM) 5 % Place 1 patch onto the skin daily for 15 doses. Remove & Discard patch within 12 hours or as directed by MD 11/16/20 12/01/20 Yes Antario Yasuda, Carola Rhine, MD  losartan (COZAAR) 50 MG tablet Take 1 tablet (50 mg total) by mouth daily. 12/24/19  Yes Martinique, Peter M, MD  methotrexate 2.5 MG tablet Take 2.5 mg by mouth once a week. Caution:Chemotherapy. Protect from light. Take on Tuesdays   Yes [provider]  metoprolol succinate (TOPROL-XL) 25 MG 24 hr tablet Take 1 tablet (25 mg total) by mouth daily. 10/29/19  Yes Martinique, Peter M, MD  multivitamin [facility]) per tablet Take 1 tablet by mouth daily.   Yes [provider]  oxyCODONE (ROXICODONE) 5 MG immediate release tablet Take 1 tablet (5 mg total) by mouth every 6 (six) hours as needed for up to 15 doses for severe pain. 11/16/20  Yes Anthonymichael Munday, Carola Rhine, MD  potassium chloride SA (K-DUR,KLOR-CON) 20 MEQ tablet Take 1 tablet (20 mEq total) by mouth daily. 07/08/17  Yes Martinique, Peter M, MD  metoprolol tartrate (LOPRESSOR) 25 MG tablet Take 0.5 tablets (12.5 mg total) by mouth 2 (two) times daily as needed (palpitations/heart  racing). 10/19/20   Gareth Morgan, MD  nitroGLYCERIN (NITROSTAT) 0.4 MG SL tablet PLACE 1 TABLET UNDER THE TONGUE EVERY 5 MINUTES AS NEEDED FOR CHEST PAIN. 12/02/17   Martinique, Peter M, MD  tadalafil (CIALIS) 20 MG tablet Take one tablet by mouth every other day as needed for erectile dysfunction 02/26/20   Burchette, Alinda Sierras, MD    Allergies    Patient has no known allergies.  Review of Systems   Review of Systems  Constitutional: Negative for chills and fever.  Respiratory: Negative for cough and shortness of  breath.   Cardiovascular: Negative for chest pain and palpitations.  Gastrointestinal: Negative for abdominal pain and vomiting.  Musculoskeletal: Positive for arthralgias, back pain, joint swelling and myalgias. Negative for neck pain and neck stiffness.  Skin: Positive for rash and wound.  Neurological: Negative for weakness and numbness.  Psychiatric/Behavioral: Negative for agitation and confusion.  All other systems reviewed and are negative.   Physical Exam Updated Vital Signs BP (!) 132/58 (BP Location: Left Arm)   Pulse 68   Temp 98.7 F (37.1 C) (Oral)   Resp 16   Ht 6\' 1"  (1.854 m)   Wt 93 kg   SpO2 95%   BMI 27.05 kg/m   Physical Exam Constitutional:      General: He is not in acute distress. HENT:     Head: Normocephalic and atraumatic.  Eyes:     Conjunctiva/sclera: Conjunctivae normal.     Pupils: Pupils are equal, round, and reactive to light.  Cardiovascular:     Rate and Rhythm: Normal rate and regular rhythm.  Pulmonary:     Effort: Pulmonary effort is normal. No respiratory distress.  Abdominal:     General: There is no distension.     Tenderness: There is no abdominal tenderness.  Musculoskeletal:     Comments: Right elbow swelling, no significant tenderness, full ROM active and passive at right elbow with minimal tenderness TTP of the right mid-thoracic back overlying ribs TTP of the right lower scapula Full ROM of the right shoulder with  moderate pain in shoulder blade  Skin:    General: Skin is warm and dry.  Neurological:     General: No focal deficit present.     Mental Status: He is alert. Mental status is at baseline.  Psychiatric:        Mood and Affect: Mood normal.        Behavior: Behavior normal.     ED Results / Procedures / Treatments   Labs (all labs ordered are listed, but only abnormal results are displayed) Labs Reviewed - No data to display  EKG None  Radiology CT Chest Wo Contrast  Result Date: 11/16/2020 CLINICAL DATA:  70 year old male with chest trauma. EXAM: CT CHEST WITHOUT CONTRAST TECHNIQUE: Multidetector CT imaging of the chest was performed following the standard protocol without IV contrast. COMPARISON:  Chest radiograph dated 05/21/2018. FINDINGS: Evaluation of this exam is limited in the absence of intravenous contrast. Cardiovascular: There is mild cardiomegaly. No pericardial effusion. There is 3 vessel coronary vascular calcification and postsurgical changes of CABG. Moderate atherosclerotic calcification of the thoracic aorta. No aneurysmal dilatation. The central pulmonary arteries are grossly unremarkable. Mediastinum/Nodes: No hilar or mediastinal adenopathy. The esophagus is grossly unremarkable. No mediastinal fluid collection. Lungs/Pleura: Linear atelectasis or scarring in the right middle lobe. No focal consolidation, pleural effusion, or pneumothorax. Calcified pleural plaque along the left upper lobe. The central airways are patent. Upper Abdomen: No acute abnormality. Musculoskeletal: Mildly displaced fractures of the anterior right 3-sixth ribs. Mild right chest wall subpleural contusion. Nondisplaced fractures of the posterior right fifth and sixth ribs. Median sternotomy wires. IMPRESSION: Multiple right rib fractures. No pneumothorax. Electronically Signed   By: Anner Crete M.D.   On: 11/16/2020 17:06    Procedures Procedures   Medications Ordered in ED Medications -  No data to display  ED Course  I have reviewed the triage vital signs and the nursing notes.  Pertinent labs & imaging results that were available during my care of the  patient were reviewed by me and considered in my medical decision making (see chart for details).  Low impact fall from standing, while on blood thinners.  He has a right elbow hematoma, no significant tenderness and excellent range of motion.  I highly doubt a fracture here.  He has mild to moderate tenderness of the right posterior thoracic mid back and right scapula.  Will obtain a CT scan of the chest evaluate for fracture.  No signs or symptoms of head trauma or loss of consciousness.  Doubt brain bleed.  No other evidence of traumatic injury my exam.  *  CT pending  Clinical Course as of 11/17/20 0947  Wed Nov 16, 2020  1721 Discussed the patient's diagnosis with him and his wife, as well as pain control options, as well as incentive spirometer.  We discussed return precautions and pain particular attention to signs or symptoms of pneumonia.  I will have him follow-up with his primary care doctor in 1 to 2 weeks to ensure that his pain is getting better and that he is healing.  They verbalized understanding. [MT]    Clinical Course User Index [MT] Wyvonnia Dusky, MD    Final Clinical Impression(s) / ED Diagnoses Final diagnoses:  Closed fracture of multiple ribs of right side, initial encounter  Contusion of right lung, initial encounter    Rx / DC Orders ED Discharge Orders         Ordered    oxyCODONE (ROXICODONE) 5 MG immediate release tablet  Every 6 hours PRN        11/16/20 1721    acetaminophen (TYLENOL) 325 MG tablet  Every 6 hours PRN        11/16/20 1721    lidocaine (LIDODERM) 5 %  Every 24 hours        11/16/20 1721           Wyvonnia Dusky, MD 11/17/20 [phone]

## 2020-11-17 ENCOUNTER — Other Ambulatory Visit: Payer: Self-pay | Admitting: Cardiology

## 2020-11-17 ENCOUNTER — Telehealth: Payer: Self-pay | Admitting: Cardiology

## 2020-11-17 ENCOUNTER — Encounter: Payer: Self-pay | Admitting: Family Medicine

## 2020-11-17 NOTE — Telephone Encounter (Signed)
Spoke with [employer] rep   First documentation of AFib with RVR - 110bpm with PVCs 7am CST 11/17/20 No symptoms reported  Report will be faxed  Per chart review, patient is anticoagulated on Eliquis Monitor ordered for SVT Patient has known AFib/Flutter per notes

## 2020-11-17 NOTE — Telephone Encounter (Signed)
He is followed at [facility] by Dr [name] for his Afib. Patient should make Dr [name] aware.   Dody Smartt Martin[place] MD, [facility]

## 2020-11-17 NOTE — Telephone Encounter (Signed)
John with [employer] calling with a critical ekg reading.

## 2020-11-17 NOTE — Telephone Encounter (Signed)
Report received - reviewed by DOD Dr. Percival Spanish - no changes needed  Patient has h/o CABG, AFib (followed at [facility] by Daubert MD), HTN, Aortic Insufficiency. Patient is on Eliquis 5mg  BID and metoprolol succinate + prn metoprolol tartrate.   Spoke with patient who reports no issues with his AFib - he was unaware. Advised we are just reaching out to check in, no new orders/changes advised by MD. He voiced understanding.   Of note, he went to ED yesterday after fall -- fractured ribs. Reports he is doing OK at this time.

## 2020-11-18 ENCOUNTER — Telehealth: Payer: Self-pay

## 2020-11-18 DIAGNOSIS — I471 Supraventricular tachycardia: Secondary | ICD-10-CM

## 2020-11-18 NOTE — Telephone Encounter (Signed)
Received monitor strip from yesterday 4/28 at 2:47 pm revealing atrial fib rate 170.Stated he was not aware of fast heart beat.He has been taking metoprolol succinate 25 mg daily.He has metoprolol tartrate to take if he has fast heart beat,but he was not aware so he did not take.He is taking Eliquis 5 mg twice a day.Advised Dr.Jordan is not in office.I will make him aware on Monday 11/21/20.

## 2020-11-21 ENCOUNTER — Telehealth: Payer: Self-pay | Admitting: Cardiology

## 2020-11-21 MED ORDER — METOPROLOL SUCCINATE ER 50 MG PO TB24: 50.0000 mg | ORAL_TABLET | Freq: Every day | ORAL | 3 refills | Status: DC

## 2020-11-21 NOTE — Addendum Note (Signed)
Addended by: Cristopher Estimable on: 11/21/2020 12:14 PM   Modules accepted: Orders

## 2020-11-21 NOTE — Telephone Encounter (Signed)
Spoke with pt, aware of dr Doug Sou recommendations. New script sent to the pharmacy

## 2020-11-21 NOTE — Telephone Encounter (Signed)
Received monitor results from [employer]  8:15 am CST Afib with RVR HR  Spoke with patient and he did not feel anything abnormal during that time Dr Martinique increased Metoprolol to 100 mg daily today  Will make Dr Martinique aware of results

## 2020-11-21 NOTE — Telephone Encounter (Signed)
Verdis Frederickson is calling to report a critical EKG for this pt.Please advise

## 2020-11-21 NOTE — Telephone Encounter (Signed)
Spoke with pt, he has spoke with his provider at [facility] and they are waiting to get the final read of the monitor. He is currently not scheduled to see them. He has a follow up with [place] this month. The patient reports feeling fine. Yesterday his bp was 112/68 with pulse 73 bpm and this morning it was 107/85 and 100 bpm. He notices some palpitations several times a day when the heart rate is elevated but not always. He is questioning if the continued elevation in his heat rate will cause problems. He is on metoprolol succ 25 mg in the evening and he has the metoprolol tartrate as needed but not sure when to use it. Will forward to dr Martinique to see if adjustments in medications need to be made at patient request.

## 2020-11-21 NOTE — Telephone Encounter (Signed)
Spoke with [employer], patient has atrial fib with 12 beats of v-tach at 2:47 central time. Spoke with pt, he is feeling fine and at the time of the episode he could tell as his heart was fluttering and beating harder than usual. He will start the higher dose of metoprolol this evening. Will make dr Martinique aware.

## 2020-11-21 NOTE — Telephone Encounter (Signed)
I would go ahead and increase Toprol to 50 mg daily  Nandika Stetzer Martinique MD, [facility]

## 2020-11-22 ENCOUNTER — Telehealth: Payer: Self-pay | Admitting: Cardiology

## 2020-11-22 NOTE — Telephone Encounter (Signed)
New Message:   Please call,concerning this pt's Monitor.

## 2020-11-22 NOTE — Telephone Encounter (Signed)
Dr. [name] is patients EP at [facility].  They are able to see notes in Epic regarding abnormal event on patients cardiac event monitor, but require strips.  Please fax to [phone].

## 2020-11-22 NOTE — Telephone Encounter (Signed)
We are aware. Thanks

## 2020-11-23 NOTE — Telephone Encounter (Signed)
Monitor strips received - faxed to [facility] @ number below

## 2020-11-29 ENCOUNTER — Ambulatory Visit (INDEPENDENT_AMBULATORY_CARE_PROVIDER_SITE_OTHER): Payer: Medicare HMO | Admitting: Family Medicine

## 2020-11-29 ENCOUNTER — Encounter: Payer: Self-pay | Admitting: Family Medicine

## 2020-11-29 ENCOUNTER — Other Ambulatory Visit: Payer: Self-pay

## 2020-11-29 VITALS — BP 110/58 | HR 69 | Temp 98.0°F | Wt 211.4 lb

## 2020-11-29 DIAGNOSIS — M7021 Olecranon bursitis, right elbow: Secondary | ICD-10-CM | POA: Diagnosis not present

## 2020-11-29 DIAGNOSIS — S2241XD Multiple fractures of ribs, right side, subsequent encounter for fracture with routine healing: Secondary | ICD-10-CM | POA: Diagnosis not present

## 2020-11-29 DIAGNOSIS — R5383 Other fatigue: Secondary | ICD-10-CM | POA: Diagnosis not present

## 2020-11-29 DIAGNOSIS — I4819 Other persistent atrial fibrillation: Secondary | ICD-10-CM | POA: Diagnosis not present

## 2020-11-29 NOTE — Progress Notes (Signed)
Cardiology Office Note:    Date:  12/13/2020   ID:  [name] [name], DOB 1951-03-10, MRN [id number]  PCP:  [name] Post, MD  Cardiologist:  [name] Martinique, MD   Referring MD: [name] Post, MD   Chief Complaint  Patient presents with  . Follow-up    Afib, monitor results    History of Present Illness:    [name] [name] is a 70 y.o. male with a hx of persistent atrial flutter/fib s/p ablation 09/2017 on eliquis, CAD s/p CABG, HTN, HLD, OSA (Dr. Radford [name]), GERD, and obesity. He had CABG in 2005 and a normal stress echo in 680321. Diagnosed with atrial flutter RVR 02/2014 and had successful TEE-guided DCCV. He subsequently underwent fllutter ablation and ectopic atrial tachycardia 03/30/14 by Dr. Rayann [name]. NST Sept 2015 was nonischemic. OSA compliant on CPAP. Afib RVR in Dec 2018 and started on eliquis, torpol increased. TEE-guided DCCV 07/17/17. Echo at that time with EF 40% moderate biatrial enlargement, moderate to sever MR, moderate TR, PFO. AAD vs Afib ablation was considered. Given new cardiomyopathy, it was felt right and left heart cath were indicated. Heart monitor showed episodes of wide complex tachycardia - uncler if this was aberrancy or VT. He was seen at [facility] / Dr. Lurene [name]. Nonischemic nuclear stress test 08/2017. He was loaded with tikosyn and DCCV performed. tikosyn D/C'ed for QT prolongation. Afib ablation with pulmonary vein isolation on 10/15/17. Echo after ablation with moderate to severe AI, EF normal with restoration of NSR (EF 50-55%). DCCV at [facility] in 12/2018 for recurrent Afib. Due to bradycardia, lopressor reduced. He follows with Dr. Mina [name] at [facility]. He was last seen by [name] 08/29/20 and felt well at that time. He presented to the ER with fast heart beat: SVT vs fib/flutter.  Adenosine given with short break to 10 beats of VT, then back to SVT with RBBB. DCCV was about to be performed, but he converted on his own when they sat the patient up. He was discharged without  admission. He sent monitor strips from 4/28 with Afib in the 170s. BB was increased. Strips were faxed to his [place] cardiologist.   He presents today for follow up. Had first occurrence of Afib RVR on March 30 - HR 190s. He converted to NSR. [place] and broke ribs on April 27th. Three dogs jumped on him and he fell and broke 4 ribs on right side (3-6). No head injury, no LOC. Following rib fractures, he "didn't feel right."  He now feels well and has restarted his gym workouts. He is doing cardio and weight training. Rib pain is improving. He has not had a recurrence of Afib. While wearing the heart monitor, however, he felt Afib but not like like the rapid heart rates that prompted ER visit. Monitor showed Afib 4/28-5/7 - could be related to his rib injury. Today, sinus bradycardia with PACs. He is compliant on eliquis. He has not had near/syncope or hypotension. Overall, doing well. Had no symptoms during Afib RVR on monitor.   Past Medical History:  Diagnosis Date  . Chicken pox   . Coronary artery disease    Severe three-vessel coronary artery disease with ejection fraction of 65%  . GERD (gastroesophageal reflux disease)   . History of gout   . History of psoriasis   . Hypercholesterolemia   . Hypertension   . OSA (obstructive sleep apnea)    Moderate with AHI of 16.9/hr now on CPAP at 10cm H2O  . Psoriatic arthritis (  [facility])     Past Surgical History:  Procedure Laterality Date  . ATRIAL FLUTTER ABLATION  03/30/2014  . ATRIAL FLUTTER ABLATION N/A 03/30/2014   Procedure: ATRIAL FLUTTER ABLATION;  Surgeon: Coralyn Mark, MD;  Location: [place] CATH LAB;  Service: Cardiovascular;  Laterality: N/A;  . CARDIAC CATHETERIZATION  09/2003   Ejection fraction was estimated at 65%.  . CARDIOVERSION N/A 03/15/2014   Procedure: CARDIOVERSION;  Surgeon: Lelon Perla, MD;  Location: [facility] ENDOSCOPY;  Service: Cardiovascular;  Laterality: N/A;  . CARDIOVERSION N/A 07/17/2017   Procedure: CARDIOVERSION;  Surgeon:  Sueanne Margarita, MD;  Location: [place];  Service: Cardiovascular;  Laterality: N/A;  . CORONARY ARTERY BYPASS GRAFT  09/2003   [place]-lad,svg-diag,svg-om/distal LCX,svg-pda  . TEE WITHOUT CARDIOVERSION N/A 03/15/2014   Procedure: TRANSESOPHAGEAL ECHOCARDIOGRAM (TEE);  Surgeon: Lelon Perla, MD;  Location: [facility] ENDOSCOPY;  Service: Cardiovascular;  Laterality: N/A;  . TEE WITHOUT CARDIOVERSION N/A 07/17/2017   Procedure: TRANSESOPHAGEAL ECHOCARDIOGRAM (TEE);  Surgeon: Sueanne Margarita, MD;  Location: [facility] ENDOSCOPY;  Service: Cardiovascular;  Laterality: N/A;  . TONSILLECTOMY  1950's    Current Medications: Current Meds  Medication Sig  . acetaminophen (TYLENOL) 325 MG tablet Take 2 tablets (650 mg total) by mouth every 6 (six) hours as needed for up to 30 doses for mild pain or moderate pain.  Marland Kitchen atorvastatin (LIPITOR) 80 MG tablet TAKE 1 TABLET BY MOUTH EVERY DAY  . cholecalciferol (VITAMIN D) 1000 UNITS tablet Take 1,000 Units by mouth daily.  . Coenzyme Q10 (CO Q 10 PO) Take 100 mg by mouth daily.   . colchicine 0.6 MG tablet TAKE 1 TABLET BY MOUTH TWICE A DAY (Patient taking differently: Take 0.6 mg by mouth daily as needed (gout).)  . ELIQUIS 5 MG TABS tablet TAKE 1 TABLET BY MOUTH TWICE A DAY  . furosemide (LASIX) 20 MG tablet Take 1 tablet (20 mg total) by mouth 2 (two) times daily.  Marland Kitchen losartan (COZAAR) 50 MG tablet Take 1 tablet (50 mg total) by mouth daily.  . methotrexate 2.5 MG tablet Take 2.5 mg by mouth once a week. Caution:Chemotherapy. Protect from light. Take on Tuesdays  . metoprolol succinate (TOPROL-XL) 50 MG 24 hr tablet Take 1 tablet (50 mg total) by mouth daily. Take with or immediately following a meal.  . metoprolol tartrate (LOPRESSOR) 25 MG tablet Take 0.5 tablets (12.5 mg total) by mouth 2 (two) times daily as needed (palpitations/heart racing).  . multivitamin (THERAGRAN) per tablet Take 1 tablet by mouth daily.  . potassium chloride SA (K-DUR,KLOR-CON) 20 MEQ  tablet Take 1 tablet (20 mEq total) by mouth daily.  . tadalafil (CIALIS) 20 MG tablet Take one tablet by mouth every other day as needed for erectile dysfunction  . [DISCONTINUED] nitroGLYCERIN (NITROSTAT) 0.4 MG SL tablet PLACE 1 TABLET UNDER THE TONGUE EVERY 5 MINUTES AS NEEDED FOR CHEST PAIN.     Allergies:   Patient has no known allergies.   Social History   Socioeconomic History  . Marital status: Married    Spouse name: Not on file  . Number of children: 1  . Years of education: 4 years college  . Highest education level: Bachelor's degree (e.g., BA, AB, BS)  Occupational History  . Occupation: Print production planner: [employer]    Comment: works part time   Tobacco Use  . Smoking status: Former Smoker    Packs/day: 0.50    Years: 5.00    Pack years:  2.50    Types: Cigarettes  . Smokeless tobacco: Never Used  . Tobacco comment: "quit smoking cigarettes in the late 1970's"  Vaping Use  . Vaping Use: Never used  Substance and Sexual Activity  . Alcohol use: Yes    Alcohol/week: 4.0 standard drinks    Types: 2 Cans of beer, 2 Shots of liquor per week    Comment: 1-2  drinks a week  . Drug use: No  . Sexual activity: Yes  Other Topics Concern  . Not on file  Social History Narrative   Working part time   Married; [place] of 2   1 child   Social Determinants of Radio broadcast assistant Strain: Not on file  Food Insecurity: Not on file  Transportation Needs: Not on file  Physical Activity: Not on file  Stress: Not on file  Social Connections: Not on file     Family History: The patient's family history includes Cancer in his mother; Heart disease in his mother; Stroke in his mother.  ROS:   Please see the history of present illness.     All other systems reviewed and are negative.  EKGs/Labs/Other Studies Reviewed:    The following studies were reviewed today:  Heart monitor 10/2020:  Atrial fibrillation with RVR and intermittent  aberrancy.  Patient had returned to NSR on 12/01/20    Echo 2019 Study Conclusions   - Left ventricle: The cavity size was mildly dilated. Wall  thickness was increased in a pattern of mild LVH. Systolic  function was normal. The estimated ejection fraction was in the  range of 50% to 55%. Wall motion was normal; there were no  regional wall motion abnormalities. Left ventricular diastolic  function parameters were normal.  - Aortic valve: There was mild to moderate regurgitation.  - Mitral valve: Calcified annulus. There was mild regurgitation.  - Left atrium: The atrium was severely dilated.  - Tricuspid valve: There was moderate regurgitation.  - Pulmonary arteries: Systolic pressure was mildly increased. PA  peak pressure: 41 mm Hg (S).   Impressions:   - Normal LV systolic function; mild LVE and LVH; mild to moderate  AI; mild MR; severe LAE; moderate TR; mild pulmonary  hypertension.   EKG:  EKG is ordered today.  The ekg ordered today demonstrates sinus bradycardia HR 56 with PACs  Recent Labs: 02/26/2020: TSH 0.82 10/19/2020: BUN 10; Creatinine, Ser 0.95; Hemoglobin 16.1; Magnesium 1.8; Platelets 229; Potassium 4.0; Sodium 139  Recent Lipid Panel    Component Value Date/Time   CHOL 163 11/30/2019 0827   TRIG 74 11/30/2019 0827   HDL 74 11/30/2019 0827   CHOLHDL 2.2 11/30/2019 0827   CHOLHDL 3 11/23/2015 0837   VLDL 16.4 11/23/2015 0837   LDLCALC 75 11/30/2019 0827    Physical Exam:    VS:  BP 110/68 (BP Location: Left Arm, Patient Position: Sitting, Cuff Size: Normal)   Pulse (!) 55   Ht 6\' 1"  (1.854 m)   Wt 205 lb 6.4 oz (93.2 kg)   SpO2 98%   BMI 27.10 kg/m     Wt Readings from Last 3 Encounters:  12/13/20 205 lb 6.4 oz (93.2 kg)  11/29/20 211 lb 6.4 oz (95.9 kg)  11/16/20 205 lb (93 kg)     GEN: elderly male in NAD HEENT: Normal NECK: No JVD; No carotid bruits LYMPHATICS: No lymphadenopathy CARDIAC: irregular rhythm, regular  rate, no murmur RESPIRATORY:  Clear to auscultation without rales, wheezing or rhonchi  ABDOMEN: Soft, non-tender, non-distended MUSCULOSKELETAL:  No edema, but chronic skin changes  SKIN: Warm and dry NEUROLOGIC:  Alert and oriented x 3 PSYCHIATRIC:  Normal affect   ASSESSMENT:    1. SVT (supraventricular tachycardia) ([place])   2. Atrial flutter, unspecified type ([place])   3. Atrial fibrillation, unspecified type ([place])   4. Nonrheumatic aortic valve insufficiency   5. S/P CABG (coronary artery bypass graft)   6. OSA (obstructive sleep apnea)   7. Essential hypertension   8. Hypercholesterolemia    PLAN:    In order of problems listed above:  Paroxysmal Afib RVR - on BB - titrated to 50 mg toprol, is not using PRN metoprolol - TEE/DCCV, but reverted back to Afib, failed tikosyn - s/p flutter ablation at [facility] - heart monitor shows Afib from 4/28-5/7 with NSVT - he is sinus bradycardia today - I have asked him to touch base with his EP provider at [facility] for consideration of repeat ablation - he is considering a hiking trip in [place] - I have advised him to check in with his EP physician prior to a trip out of the country - I also suggested a Kardia device for traveling outside of the [place]   Chronic anticoagulation - continue eliquis - no bleeding problems   CAD s/p CABG in 2005 - normal myoview 03/2014, 2019 (Ambriella Kitt) - low risk ETT 02/2017 - no anginal symptoms - exercising three times weekly, will not repeat an ischemic evaluation at this time   Chronic aortic insufficiency - serial echos followed at [facility]   Tachycardia mediated CM - improved with restoration of sinus rhythm - doing well on home dose of lasix - no signs/symptoms of heart failure - is getting serial echos at [facility] for AI    OSA on CPAP - followed by Dr. Radford [name]   Hypertension - well controlled, no medication changes   Hyperlipidemia with LDL goal < 70 - update lipid profile when he returns in  August - continue statin   Keep follow up with [name] in August.   Medication Adjustments/Labs and Tests Ordered: Current medicines are reviewed at length with the patient today.  Concerns regarding medicines are outlined above.  Orders Placed This Encounter  Procedures  . EKG 12-Lead   Meds ordered this encounter  Medications  . nitroGLYCERIN (NITROSTAT) 0.4 MG SL tablet    Sig: Place 1 tablet (0.4 mg total) under the tongue every 5 (five) minutes as needed for chest pain.    Dispense:  25 tablet    Refill:  2    Signed, Ledora Bottcher, PA  12/13/2020 10:03 AM    [place]

## 2020-11-29 NOTE — Patient Instructions (Signed)
Rib Fracture  A rib fracture is a break or crack in one of the bones of the ribs. The ribs are long, curved bones that wrap around your chest and attach to your spine and your breastbone. The ribs protect your heart, lungs, and other organs in the chest. A broken or cracked rib is often painful but is not usually serious. Most rib fractures heal on their own over time. However, rib fractures can be more serious if multiple ribs are broken or if broken ribs move out of place and push against other structures or organs. What are the causes? This condition is caused by:  Repetitive movements with high force, such as pitching a baseball or having very bad coughing spells.  A direct hit the chest, such as a sports injury, a car crash, or a fall.  Cancer that has spread to the bones, which can weaken bones and cause them to break. What are the signs or symptoms? Symptoms of this condition include:  Pain when you breathe in or cough.  Pain when someone presses on the injured area.  Feeling short of breath. How is this diagnosed? This condition is diagnosed with a physical exam and medical history. You may also have imaging tests, such as:  Chest X-ray.  CT scan.  MRI.  Bone scan.  Chest ultrasound. How is this treated? Treatment for this condition depends on the severity of the fracture. Most rib fractures usually heal on their own in 1-3 months. Healing may take longer if you have a cough or if you do activities that make the injury worse. While you heal, you may be given medicines to control the pain. You will also be taught deep breathing exercises. Severe injuries may require hospitalization or surgery. Follow these instructions at home: Managing pain, stiffness, and swelling  If directed, put ice on the injured area. To do this: ? Put ice in a plastic bag. ? Place a towel between your skin and the bag. ? Leave the ice on for 20 minutes, 2-3 times a day. ? Remove the ice if  your skin turns bright red. This is very important. If you cannot feel pain, heat, or cold, you have a greater risk of damage to the area.  Take over-the-counter and prescription medicines only as told by your health care provider. Activity  Avoid doing activities or movements that cause pain. Be careful during activities and avoid bumping the injured rib.  Slowly increase your activity as told by your health care provider. General instructions  Do deep breathing exercises as told by your health care provider. This helps prevent pneumonia, which is a common complication of a broken rib. Your health care provider may instruct you to: ? Take deep breaths several times a day. ? Try to cough several times a day, holding a pillow against the injured area. ? Use a device called incentive spirometer to practice deep breathing several times a day.  Drink enough fluid to keep your urine pale yellow.  Do not wear a rib belt or binder. These restrict breathing, which can lead to pneumonia.  Keep all follow-up visits. This is important. Contact a health care provider if:  You have a fever. Get help right away if:  You have difficulty breathing or you are short of breath.  You develop a cough that does not stop, or you cough up thick or bloody sputum.  You have nausea, vomiting, or pain in your abdomen.  Your pain gets worse and medicine  does not help. These symptoms may represent a serious problem that is an emergency. Do not wait to see if the symptoms will go away. Get medical help right away. Call your local emergency services (911 in the [place] Do not drive yourself to the hospital. Summary  A rib fracture is a break or crack in one of the bones of the ribs.  A broken or cracked rib is often painful but is not usually serious.  Most rib fractures heal on their own over time.  Treatment for this condition depends on the severity of the fracture.  Avoid doing any activities or  movements that cause pain. This information is not intended to replace advice given to you by your health care provider. Make sure you discuss any questions you have with your health care provider. Document Revised: 10/30/2019 Document Reviewed: 10/30/2019 Elsevier Patient Education  2021 [employer]  

## 2020-11-29 NOTE — Progress Notes (Signed)
Established Patient Office Visit  Subjective:  Patient ID: PER BEAGLEY, male    DOB: 10/24/1950  Age: 70 y.o. MRN: [id number]  CC:  Chief Complaint  Patient presents with  . Hospitalization Follow-up    Fractured ribs    HPI [name] [name] presents for couple of recent ER visits.  Most recent was on 11/16/2020 following a fall.  He was over at a friend's house and was basically knocked off balance by couple of large dogs.  He fell into a 2 x 4 which is standing up on his side and landed right rib cage area.  Imaging with CT chest revealed mildly displaced fractures of the anterior right third through sixth ribs.  Also nondisplaced fractures of the posterior right fifth and sixth rib.  Patient was given incentive spirometer which he is using regularly.  Pain is improving.  He has used mostly just regular Tylenol.  He was given some oxycodone and has taken only half of a tablet on a couple of occasions.  No fever.  No dyspnea.  Has large area of bruising right chest wall and back from fall with small hematoma.  He also developed small amount of swelling right elbow olecranon bursa region.  Full range of motion right elbow.  Mild soreness of the forearm but full range of motion elbow and wrist.  No pain with supination or pronation  Patient also seen in ER on 10-19-2020 with tachycardia with heart rate of 195.  Suspected SVT.  Patient was given adenosine and developed 10 beat run of V. tach but then converted to SVT with right bundle branch block.  Plan was to cardiovert but in the process of sitting him up in bed he coughed and then reverted to normal sinus rhythm.  Cardiology has increased his Toprol-XL from 25 to 50 mg.  He also has short acting metoprolol to take as well as needed.  He remains on Eliquis.  General fatigue issues.  He also has low libido.  Testosterone check over a year ago was 391.  Recent thyroid function last August was normal.  Denies any active depression  issues.    Past Medical History:  Diagnosis Date  . Chicken pox   . Coronary artery disease    Severe three-vessel coronary artery disease with ejection fraction of 65%  . GERD (gastroesophageal reflux disease)   . History of gout   . History of psoriasis   . Hypercholesterolemia   . Hypertension   . OSA (obstructive sleep apnea)    Moderate with AHI of 16.9/hr now on CPAP at 10cm H2O  . Psoriatic arthritis [facility])     Past Surgical History:  Procedure Laterality Date  . ATRIAL FLUTTER ABLATION  03/30/2014  . ATRIAL FLUTTER ABLATION N/A 03/30/2014   Procedure: ATRIAL FLUTTER ABLATION;  Surgeon: Coralyn Mark, MD;  Location: [place] CATH LAB;  Service: Cardiovascular;  Laterality: N/A;  . CARDIAC CATHETERIZATION  09/2003   Ejection fraction was estimated at 65%.  . CARDIOVERSION N/A 03/15/2014   Procedure: CARDIOVERSION;  Surgeon: Lelon Perla, MD;  Location: [facility] ENDOSCOPY;  Service: Cardiovascular;  Laterality: N/A;  . CARDIOVERSION N/A 07/17/2017   Procedure: CARDIOVERSION;  Surgeon: Sueanne Margarita, MD;  Location: [place];  Service: Cardiovascular;  Laterality: N/A;  . CORONARY ARTERY BYPASS GRAFT  09/2003   [place]-lad,svg-diag,svg-om/distal LCX,svg-pda  . TEE WITHOUT CARDIOVERSION N/A 03/15/2014   Procedure: TRANSESOPHAGEAL ECHOCARDIOGRAM (TEE);  Surgeon: Lelon Perla, MD;  Location: [facility] ENDOSCOPY;  Service: Cardiovascular;  Laterality: N/A;  . TEE WITHOUT CARDIOVERSION N/A 07/17/2017   Procedure: TRANSESOPHAGEAL ECHOCARDIOGRAM (TEE);  Surgeon: Sueanne Margarita, MD;  Location: [facility] ENDOSCOPY;  Service: Cardiovascular;  Laterality: N/A;  . TONSILLECTOMY  1950's    Family History  Problem Relation Age of Onset  . Heart disease Mother        cabg  . Cancer Mother        breast  . Stroke Mother     Social History   Socioeconomic History  . Marital status: Married    Spouse name: Not on file  . Number of children: 1  . Years of education: 4 years college  . Highest  education level: Bachelor's degree (e.g., BA, AB, BS)  Occupational History  . Occupation: Print production planner: [employer]    Comment: works part time   Tobacco Use  . Smoking status: Former Smoker    Packs/day: 0.50    Years: 5.00    Pack years: 2.50    Types: Cigarettes  . Smokeless tobacco: Never Used  . Tobacco comment: "quit smoking cigarettes in the late 1970's"  Vaping Use  . Vaping Use: Never used  Substance and Sexual Activity  . Alcohol use: Yes    Alcohol/week: 4.0 standard drinks    Types: 2 Cans of beer, 2 Shots of liquor per week    Comment: 1-2  drinks a week  . Drug use: No  . Sexual activity: Yes  Other Topics Concern  . Not on file  Social History Narrative   Working part time   Married; [place] of 2   1 child   Social Determinants of Radio broadcast assistant Strain: Not on file  Food Insecurity: Not on file  Transportation Needs: Not on file  Physical Activity: Not on file  Stress: Not on file  Social Connections: Not on file  Intimate Partner Violence: Not on file    Outpatient Medications Prior to Visit  Medication Sig Dispense Refill  . acetaminophen (TYLENOL) 325 MG tablet Take 2 tablets (650 mg total) by mouth every 6 (six) hours as needed for up to 30 doses for mild pain or moderate pain. 30 tablet 0  . atorvastatin (LIPITOR) 80 MG tablet TAKE 1 TABLET BY MOUTH EVERY DAY 90 tablet 3  . cholecalciferol (VITAMIN D) 1000 UNITS tablet Take 1,000 Units by mouth daily.    . Coenzyme Q10 (CO Q 10 PO) Take 100 mg by mouth daily.     . colchicine 0.6 MG tablet TAKE 1 TABLET BY MOUTH TWICE A DAY (Patient taking differently: Take 0.6 mg by mouth daily as needed (gout).) 180 tablet 1  . ELIQUIS 5 MG TABS tablet TAKE 1 TABLET BY MOUTH TWICE A DAY 180 tablet 1  . furosemide (LASIX) 20 MG tablet Take 1 tablet (20 mg total) by mouth 2 (two) times daily. 180 tablet 3  . lidocaine (LIDODERM) 5 % Place 1 patch onto the skin daily for 15 doses.  Remove & Discard patch within 12 hours or as directed by MD 15 patch 0  . losartan (COZAAR) 50 MG tablet Take 1 tablet (50 mg total) by mouth daily. 90 tablet 3  . methotrexate 2.5 MG tablet Take 2.5 mg by mouth once a week. Caution:Chemotherapy. Protect from light. Take on Tuesdays    . metoprolol succinate (TOPROL-XL) 50 MG 24 hr tablet Take 1 tablet (50 mg total) by mouth daily. Take with or immediately following  a meal. 90 tablet 3  . metoprolol tartrate (LOPRESSOR) 25 MG tablet Take 0.5 tablets (12.5 mg total) by mouth 2 (two) times daily as needed (palpitations/heart racing). 12 tablet 0  . multivitamin (THERAGRAN) per tablet Take 1 tablet by mouth daily.    . nitroGLYCERIN (NITROSTAT) 0.4 MG SL tablet PLACE 1 TABLET UNDER THE TONGUE EVERY 5 MINUTES AS NEEDED FOR CHEST PAIN. 25 tablet 5  . oxyCODONE (ROXICODONE) 5 MG immediate release tablet Take 1 tablet (5 mg total) by mouth every 6 (six) hours as needed for up to 15 doses for severe pain. 15 tablet 0  . potassium chloride SA (K-DUR,KLOR-CON) 20 MEQ tablet Take 1 tablet (20 mEq total) by mouth daily. 30 tablet 6  . tadalafil (CIALIS) 20 MG tablet Take one tablet by mouth every other day as needed for erectile dysfunction 10 tablet 5   No facility-administered medications prior to visit.    No Known Allergies  ROS Review of Systems  Constitutional: Positive for fatigue.  Respiratory: Negative for cough and shortness of breath.   Cardiovascular: Negative for chest pain.  Gastrointestinal: Negative for abdominal pain.  Neurological: Negative for dizziness, syncope and weakness.  Psychiatric/Behavioral: Negative for confusion.      Objective:    Physical Exam Vitals reviewed.  Constitutional:      Appearance: Normal appearance.  Cardiovascular:     Comments: Irregular rhythm.  Rate controlled.  By auscultation sounds like he may be having some premature beats.  He frequently has a run of 3-4 regular beats followed by  premature. Pulmonary:     Effort: Pulmonary effort is normal.     Breath sounds: Normal breath sounds.     Comments: Has fairly extensive area of bruising right lateral chest wall region.  Small hematoma right lateral chest wall which is nontender. Musculoskeletal:     Right lower leg: No edema.     Left lower leg: No edema.     Comments: Small swelling right olecranon bursa.  No warmth or erythema.  Full range of motion elbow.  No proximal radial tenderness.  Neurological:     Mental Status: He is alert.     BP (!) 110/58 (BP Location: Left Arm, Patient Position: Sitting, Cuff Size: Normal)   Pulse 69   Temp 98 F (36.7 C) (Oral)   Wt 211 lb 6.4 oz (95.9 kg)   SpO2 97%   BMI 27.89 kg/m  Wt Readings from Last 3 Encounters:  11/29/20 211 lb 6.4 oz (95.9 kg)  11/16/20 205 lb (93 kg)  10/19/20 205 lb (93 kg)     Health Maintenance Due  Topic Date Due  . TETANUS/TDAP  07/23/2018  . COVID-19 Vaccine (3 - Moderna risk 4-dose series) 10/15/2019    There are no preventive care reminders to display for this patient.  Lab Results  Component Value Date   TSH 0.82 02/26/2020   Lab Results  Component Value Date   WBC 11.0 (H) 10/19/2020   HGB 16.1 10/19/2020   HCT 47.0 10/19/2020   MCV 93.1 10/19/2020   PLT 229 10/19/2020   Lab Results  Component Value Date   NA 139 10/19/2020   K 4.0 10/19/2020   CO2 25 10/19/2020   GLUCOSE 115 (H) 10/19/2020   BUN 10 10/19/2020   CREATININE 0.95 10/19/2020   BILITOT 1.2 11/30/2019   ALKPHOS 86 11/30/2019   AST 25 11/30/2019   ALT 22 11/30/2019   PROT 6.4 11/30/2019   ALBUMIN 4.6 11/30/2019  CALCIUM 9.4 10/19/2020   ANIONGAP 11 10/19/2020   GFR 91.74 11/23/2015   Lab Results  Component Value Date   CHOL 163 11/30/2019   Lab Results  Component Value Date   HDL 74 11/30/2019   Lab Results  Component Value Date   LDLCALC 75 11/30/2019   Lab Results  Component Value Date   TRIG 74 11/30/2019   Lab Results   Component Value Date   CHOLHDL 2.2 11/30/2019   No results found for: HGBA1C    Assessment & Plan:   #1 closed fracture of multiple ribs on the right side.  Appears to be healing very well.  Doing surprisingly well with pain with minimal medication -Continue frequent use of incentive spirometer.  Continue Tylenol which seems to be adequately controlling his pain  #2 history of A. fib/flutter.  Recent episode of SVT.  Converted spontaneously after vagal maneuver with cough -Continue beta-blockers and close follow-up with cardiology -He has cardiac event monitor in place which she will complete soon -Continue Eliquis  #3 fatigue/low libido.  No SSRI use.  Previous testosterone level was within normal range at 391. -Suspect both issues are multifactorial.  Explained medication certainly could be playing some role.  Could repeat testosterone screen but would not replace unless okayed by cardiology.  No orders of the defined types were placed in this encounter.   Follow-up: No follow-ups on file.    [name], MD

## 2020-11-30 ENCOUNTER — Ambulatory Visit: Payer: Medicare HMO | Admitting: Family Medicine

## 2020-12-13 ENCOUNTER — Encounter: Payer: Self-pay | Admitting: Physician Assistant

## 2020-12-13 ENCOUNTER — Ambulatory Visit (INDEPENDENT_AMBULATORY_CARE_PROVIDER_SITE_OTHER): Payer: Medicare HMO | Admitting: Physician Assistant

## 2020-12-13 ENCOUNTER — Other Ambulatory Visit: Payer: Self-pay | Admitting: Physician Assistant

## 2020-12-13 ENCOUNTER — Other Ambulatory Visit: Payer: Self-pay

## 2020-12-13 VITALS — BP 110/68 | HR 55 | Ht 73.0 in | Wt 205.4 lb

## 2020-12-13 DIAGNOSIS — I471 Supraventricular tachycardia: Secondary | ICD-10-CM | POA: Diagnosis not present

## 2020-12-13 DIAGNOSIS — I10 Essential (primary) hypertension: Secondary | ICD-10-CM | POA: Diagnosis not present

## 2020-12-13 DIAGNOSIS — I351 Nonrheumatic aortic (valve) insufficiency: Secondary | ICD-10-CM | POA: Diagnosis not present

## 2020-12-13 DIAGNOSIS — Z951 Presence of aortocoronary bypass graft: Secondary | ICD-10-CM

## 2020-12-13 DIAGNOSIS — I4891 Unspecified atrial fibrillation: Secondary | ICD-10-CM | POA: Diagnosis not present

## 2020-12-13 DIAGNOSIS — E7800 Pure hypercholesterolemia, unspecified: Secondary | ICD-10-CM

## 2020-12-13 DIAGNOSIS — I4892 Unspecified atrial flutter: Secondary | ICD-10-CM

## 2020-12-13 DIAGNOSIS — G4733 Obstructive sleep apnea (adult) (pediatric): Secondary | ICD-10-CM | POA: Diagnosis not present

## 2020-12-13 MED ORDER — NITROGLYCERIN 0.4 MG SL SUBL: 0.4000 mg | SUBLINGUAL_TABLET | SUBLINGUAL | 2 refills | Status: DC | PRN

## 2020-12-13 NOTE — Patient Instructions (Signed)
Medication Instructions:  The current medical regimen is effective;  continue present plan and medications as directed. Please refer to the Current Medication list given to you today.; *If you need a refill on your cardiac medications before your next appointment, please call your pharmacy*  Lab Work:   Testing/Procedures:  NONE    NONE  Special Instructions MAKE SURE TO FOLLOW UP WITH ELECTROPHYSIOLOGY @[facility]   Follow-Up: Your next appointment:  KEEP SCHEDULED FOLLOW UP APPOINTMENT   In Person with [name], MD At [facility], you and your health needs are our priority.  As part of our continuing mission to provide you with exceptional heart care, we have created designated Provider Care Teams.  These Care Teams include your primary Cardiologist (physician) and Advanced Practice Providers (APPs -  Physician Assistants and Nurse Practitioners) who all work together to provide you with the care you need, when you need it.

## 2020-12-16 ENCOUNTER — Telehealth: Payer: Self-pay | Admitting: Cardiology

## 2020-12-16 NOTE — Telephone Encounter (Signed)
Spoke to patient . Patient states he received a MyChart message that Dr [name] ( EP at [facility])  has not received all of the monitor report that is needed .  RN called [facility] - Dr [name]   - phone [phone] Spoke to [place] RN  Confirmed that Dr [name] would like the whole complete monitor report ( all 193 pages-  Fax number given [phone]   Patient is aware.  informed patient once he receive appointment from [place] - if he would come aget a hard copy to take with him to his visit . Patient aware will need to come to office sign medical release for monitor report.

## 2020-12-16 NOTE — Telephone Encounter (Signed)
Monitor report faxed to [facility] #number..  4 separate faxes of the 193 pages completed

## 2020-12-16 NOTE — Telephone Encounter (Signed)
Pt is calling in regards to his heart monitor. Pt states [facility] called him and stated that they did not receive the tracings from the heart monitor. Pt heart monitor ended on 12/03/20. Please advise

## 2020-12-20 ENCOUNTER — Other Ambulatory Visit: Payer: Self-pay

## 2021-01-02 ENCOUNTER — Telehealth: Payer: Self-pay | Admitting: Family Medicine

## 2021-01-02 NOTE — Telephone Encounter (Signed)
The patient is out of town and will call back when he is in town and around his calendar.

## 2021-01-02 NOTE — Telephone Encounter (Signed)
Left message for patient to call back and schedule Medicare Annual Wellness Visit (AWV) either virtually or in office.   Last AWV 07/21/2019  please schedule at anytime with [facility] Nurse Health Advisor 1 or 2   This should be a 45 minute visit.

## 2021-01-23 ENCOUNTER — Other Ambulatory Visit: Payer: Self-pay | Admitting: Cardiology

## 2021-01-30 ENCOUNTER — Ambulatory Visit (INDEPENDENT_AMBULATORY_CARE_PROVIDER_SITE_OTHER): Payer: Medicare HMO

## 2021-01-30 ENCOUNTER — Other Ambulatory Visit: Payer: Self-pay

## 2021-01-30 VITALS — BP 124/64 | HR 67 | Temp 98.0°F | Ht 73.0 in | Wt 205.0 lb

## 2021-01-30 DIAGNOSIS — Z0000 Encounter for general adult medical examination without abnormal findings: Secondary | ICD-10-CM

## 2021-01-30 NOTE — Patient Instructions (Signed)
[name] , Thank you for taking time to come for your Medicare Wellness Visit. I appreciate your ongoing commitment to your health goals. Please review the following plan we discussed and let me know if I can assist you in the future.   Screening recommendations/referrals: Colonoscopy: 11/07/2011 Recommended yearly ophthalmology/optometry visit for glaucoma screening and checkup Recommended yearly dental visit for hygiene and checkup  Vaccinations: Influenza vaccine: due fall 2022 Pneumococcal vaccine: completed series  Tdap vaccine: due upon injury  Shingles vaccine: will obtain local pharmacy     Advanced directives: will provide copies   Conditions/risks identified: none   Next appointment: 02/28/2021  @845am    CPE  Dr.Burchette   Preventive Care 72 Years and Older, Male Preventive care refers to lifestyle choices and visits with your health care provider that can promote health and wellness. What does preventive care include? A yearly physical exam. This is also called an annual well check. Dental exams once or twice a year. Routine eye exams. Ask your health care provider how often you should have your eyes checked. Personal lifestyle choices, including: Daily care of your teeth and gums. Regular physical activity. Eating a healthy diet. Avoiding tobacco and drug use. Limiting alcohol use. Practicing safe sex. Taking low doses of aspirin every day. Taking vitamin and mineral supplements as recommended by your health care provider. What happens during an annual well check? The services and screenings done by your health care provider during your annual well check will depend on your age, overall health, lifestyle risk factors, and family history of disease. Counseling  Your health care provider may ask you questions about your: Alcohol use. Tobacco use. Drug use. Emotional well-being. Home and relationship well-being. Sexual activity. Eating habits. History of  falls. Memory and ability to understand (cognition). Work and work Statistician. Screening  You may have the following tests or measurements: Height, weight, and BMI. Blood pressure. Lipid and cholesterol levels. These may be checked every 5 years, or more frequently if you are over 72 years old. Skin check. Lung cancer screening. You may have this screening every year starting at age 72 if you have a 30-pack-year history of smoking and currently smoke or have quit within the past 15 years. Fecal occult blood test (FOBT) of the stool. You may have this test every year starting at age 72. Flexible sigmoidoscopy or colonoscopy. You may have a sigmoidoscopy every 5 years or a colonoscopy every 10 years starting at age 72. Prostate cancer screening. Recommendations will vary depending on your family history and other risks. Hepatitis C blood test. Hepatitis B blood test. Sexually transmitted disease (STD) testing. Diabetes screening. This is done by checking your blood sugar (glucose) after you have not eaten for a while (fasting). You may have this done every 1-3 years. Abdominal aortic aneurysm (AAA) screening. You may need this if you are a current or former smoker. Osteoporosis. You may be screened starting at age 72 if you are at high risk. Talk with your health care provider about your test results, treatment options, and if necessary, the need for more tests. Vaccines  Your health care provider may recommend certain vaccines, such as: Influenza vaccine. This is recommended every year. Tetanus, diphtheria, and acellular pertussis (Tdap, Td) vaccine. You may need a Td booster every 10 years. Zoster vaccine. You may need this after age 72. Pneumococcal 13-valent conjugate (PCV13) vaccine. One dose is recommended after age 72. Pneumococcal polysaccharide (PPSV23) vaccine. One dose is recommended after age 72. Talk  to your health care provider about which screenings and vaccines you need and  how often you need them. This information is not intended to replace advice given to you by your health care provider. Make sure you discuss any questions you have with your health care provider. Document Released: 08/05/2015 Document Revised: 03/28/2016 Document Reviewed: 05/10/2015 Elsevier Interactive Patient Education  2017 [employer] Prevention in the Home Falls can cause injuries. They can happen to people of all ages. There are many things you can do to make your home safe and to help prevent falls. What can I do on the outside of my home? Regularly fix the edges of walkways and driveways and fix any cracks. Remove anything that might make you trip as you walk through a door, such as a raised step or threshold. Trim any bushes or trees on the path to your home. Use bright outdoor lighting. Clear any walking paths of anything that might make someone trip, such as rocks or tools. Regularly check to see if handrails are loose or broken. Make sure that both sides of any steps have handrails. Any raised decks and porches should have guardrails on the edges. Have any leaves, snow, or ice cleared regularly. Use sand or salt on walking paths during winter. Clean up any spills in your garage right away. This includes oil or grease spills. What can I do in the bathroom? Use night lights. Install grab bars by the toilet and in the tub and shower. Do not use towel bars as grab bars. Use non-skid mats or decals in the tub or shower. If you need to sit down in the shower, use a plastic, non-slip stool. Keep the floor dry. Clean up any water that spills on the floor as soon as it happens. Remove soap buildup in the tub or shower regularly. Attach bath mats securely with double-sided non-slip rug tape. Do not have throw rugs and other things on the floor that can make you trip. What can I do in the bedroom? Use night lights. Make sure that you have a light by your bed that is easy to  reach. Do not use any sheets or blankets that are too big for your bed. They should not hang down onto the floor. Have a firm chair that has side arms. You can use this for support while you get dressed. Do not have throw rugs and other things on the floor that can make you trip. What can I do in the kitchen? Clean up any spills right away. Avoid walking on wet floors. Keep items that you use a lot in easy-to-reach places. If you need to reach something above you, use a strong step stool that has a grab bar. Keep electrical cords out of the way. Do not use floor polish or wax that makes floors slippery. If you must use wax, use non-skid floor wax. Do not have throw rugs and other things on the floor that can make you trip. What can I do with my stairs? Do not leave any items on the stairs. Make sure that there are handrails on both sides of the stairs and use them. Fix handrails that are broken or loose. Make sure that handrails are as long as the stairways. Check any carpeting to make sure that it is firmly attached to the stairs. Fix any carpet that is loose or worn. Avoid having throw rugs at the top or bottom of the stairs. If you do have throw rugs,  attach them to the floor with carpet tape. Make sure that you have a light switch at the top of the stairs and the bottom of the stairs. If you do not have them, ask someone to add them for you. What else can I do to help prevent falls? Wear shoes that: Do not have high heels. Have rubber bottoms. Are comfortable and fit you well. Are closed at the toe. Do not wear sandals. If you use a stepladder: Make sure that it is fully opened. Do not climb a closed stepladder. Make sure that both sides of the stepladder are locked into place. Ask someone to hold it for you, if possible. Clearly mark and make sure that you can see: Any grab bars or handrails. First and last steps. Where the edge of each step is. Use tools that help you move  around (mobility aids) if they are needed. These include: Canes. Walkers. Scooters. Crutches. Turn on the lights when you go into a dark area. Replace any light bulbs as soon as they burn out. Set up your furniture so you have a clear path. Avoid moving your furniture around. If any of your floors are uneven, fix them. If there are any pets around you, be aware of where they are. Review your medicines with your doctor. Some medicines can make you feel dizzy. This can increase your chance of falling. Ask your doctor what other things that you can do to help prevent falls. This information is not intended to replace advice given to you by your health care provider. Make sure you discuss any questions you have with your health care provider. Document Released: 05/05/2009 Document Revised: 12/15/2015 Document Reviewed: 08/13/2014 Elsevier Interactive Patient Education  2017 [employer].

## 2021-01-30 NOTE — Progress Notes (Signed)
Subjective:   [name] [name] is a 70 y.o. male who presents for an Initial Medicare Annual Wellness Visit.  Review of Systems    N/a       Objective:    There were no vitals filed for this visit. There is no height or weight on file to calculate BMI.  Advanced Directives 11/16/2020 07/21/2019 04/08/2018 07/17/2017 03/14/2017 03/30/2014 03/14/2014  Does Patient Have a Medical Advance Directive? Yes Yes Yes No Yes Yes Yes  Type of Paramedic of [place];Living will [place];Living will - - - - Press photographer;Living will  Does patient want to make changes to medical advance directive? - No - Patient declined - - - No - Patient declined No - Patient declined  Copy of Healthcare Power of Attorney in Chart? - No - copy requested - - - No - copy requested No - copy requested  Would patient like information on creating a medical advance directive? - - - No - Patient declined - - -    Current Medications (verified) Outpatient Encounter Medications as of 01/30/2021  Medication Sig   acetaminophen (TYLENOL) 325 MG tablet Take 2 tablets (650 mg total) by mouth every 6 (six) hours as needed for up to 30 doses for mild pain or moderate pain.   atorvastatin (LIPITOR) 80 MG tablet TAKE 1 TABLET BY MOUTH EVERY DAY   cholecalciferol (VITAMIN D) 1000 UNITS tablet Take 1,000 Units by mouth daily.   Coenzyme Q10 (CO Q 10 PO) Take 100 mg by mouth daily.    colchicine 0.6 MG tablet TAKE 1 TABLET BY MOUTH TWICE A DAY (Patient taking differently: Take 0.6 mg by mouth daily as needed (gout).)   ELIQUIS 5 MG TABS tablet TAKE 1 TABLET BY MOUTH TWICE A DAY   furosemide (LASIX) 20 MG tablet Take 1 tablet (20 mg total) by mouth 2 (two) times daily.   losartan (COZAAR) 50 MG tablet TAKE 1 TABLET BY MOUTH EVERY DAY   methotrexate 2.5 MG tablet Take 2.5 mg by mouth once a week. Caution:Chemotherapy. Protect from light. Take on Tuesdays   metoprolol succinate  (TOPROL-XL) 50 MG 24 hr tablet Take 1 tablet (50 mg total) by mouth daily. Take with or immediately following a meal.   metoprolol tartrate (LOPRESSOR) 25 MG tablet Take 0.5 tablets (12.5 mg total) by mouth 2 (two) times daily as needed (palpitations/heart racing).   multivitamin (THERAGRAN) per tablet Take 1 tablet by mouth daily.   nitroGLYCERIN (NITROSTAT) 0.4 MG SL tablet Place 1 tablet (0.4 mg total) under the tongue every 5 (five) minutes as needed for chest pain.   potassium chloride SA (K-DUR,KLOR-CON) 20 MEQ tablet Take 1 tablet (20 mEq total) by mouth daily.   tadalafil (CIALIS) 20 MG tablet Take one tablet by mouth every other day as needed for erectile dysfunction   No facility-administered encounter medications on file as of 01/30/2021.    Allergies (verified) Patient has no known allergies.   History: Past Medical History:  Diagnosis Date   Chicken pox    Coronary artery disease    Severe three-vessel coronary artery disease with ejection fraction of 65%   GERD (gastroesophageal reflux disease)    History of gout    History of psoriasis    Hypercholesterolemia    Hypertension    OSA (obstructive sleep apnea)    Moderate with AHI of 16.9/hr now on CPAP at 10cm H2O   Psoriatic arthritis ([place])  Past Surgical History:  Procedure Laterality Date   ATRIAL FLUTTER ABLATION  03/30/2014   ATRIAL FLUTTER ABLATION N/A 03/30/2014   Procedure: ATRIAL FLUTTER ABLATION;  Surgeon: Coralyn Mark, MD;  Location: [facility] CATH [facility];  Service: Cardiovascular;  Laterality: N/A;   CARDIAC CATHETERIZATION  09/2003   Ejection fraction was estimated at 65%.   CARDIOVERSION N/A 03/15/2014   Procedure: CARDIOVERSION;  Surgeon: Lelon Perla, MD;  Location: [facility] ENDOSCOPY;  Service: Cardiovascular;  Laterality: N/A;   CARDIOVERSION N/A 07/17/2017   Procedure: CARDIOVERSION;  Surgeon: Sueanne Margarita, MD;  Location[facility] ENDOSCOPY;  Service: Cardiovascular;  Laterality: N/A;   CORONARY ARTERY BYPASS  GRAFT  09/2003   [place]-lad,svg-diag,svg-om/distal LCX,svg-pda   TEE WITHOUT CARDIOVERSION N/A 03/15/2014   Procedure: TRANSESOPHAGEAL ECHOCARDIOGRAM (TEE);  Surgeon: Lelon Perla, MD;  Location: [facility] ENDOSCOPY;  Service: Cardiovascular;  Laterality: N/A;   TEE WITHOUT CARDIOVERSION N/A 07/17/2017   Procedure: TRANSESOPHAGEAL ECHOCARDIOGRAM (TEE);  Surgeon: Sueanne Margarita, MD;  Location: [facility] ENDOSCOPY;  Service: Cardiovascular;  Laterality: N/A;   TONSILLECTOMY  1950's   Family History  Problem Relation Age of Onset   Heart disease Mother        cabg   Cancer Mother        breast   Stroke Mother    Social History   Socioeconomic History   Marital status: Married    Spouse name: Not on file   Number of children: 1   Years of education: 4 years college   Highest education level: Bachelor's degree (e.g., BA, AB, BS)  Occupational History   Occupation: Print production planner: [employer]    Comment: works part time   Tobacco Use   Smoking status: Former    Packs/day: 0.50    Years: 5.00    Pack years: 2.50    Types: Cigarettes   Smokeless tobacco: Never   Tobacco comments:    "quit smoking cigarettes in the late 1970's"  Vaping Use   Vaping Use: Never used  Substance and Sexual Activity   Alcohol use: Yes    Alcohol/week: 4.0 standard drinks    Types: 2 Cans of beer, 2 Shots of liquor per week    Comment: 1-2  drinks a week   Drug use: No   Sexual activity: Yes  Other Topics Concern   Not on file  Social History Narrative   Working part time   Married; [place] of 2   1 child   Social Determinants of Radio broadcast assistant Strain: Not on file  Food Insecurity: Not on file  Transportation Needs: Not on file  Physical Activity: Not on file  Stress: Not on file  Social Connections: Not on file    Tobacco Counseling Counseling given: Not Answered Tobacco comments: "quit smoking cigarettes in the late 1970's"   Clinical Intake:                  Diabetic?no         Activities of Daily Living No flowsheet data found.  Patient Care Team: [name], MD as PCP - General (Family Medicine) [place], Peter M, MD as PCP - Cardiology (Cardiology)  Indicate any recent Medical Services you may have received from other than [facility] providers in the past year (date may be approximate).     Assessment:   This is a routine wellness examination for Sheena.  Hearing/Vision screen No results found.  Dietary issues and exercise activities  discussed:     Goals Addressed   None    Depression Screen PHQ 2/9 Scores 07/21/2019 04/08/2018 03/14/2017 10/12/2014  PHQ - 2 Score 0 0 0 0    Fall Risk Fall Risk  07/21/2019 04/08/2018 03/14/2017 10/12/2014  Falls in the past year? 0 No Yes No  Number falls in past yr: - - 1 -  Comment - - fell going hiking  -  Risk for fall due to : Medication side effect - - -  Follow up Falls evaluation completed;Education provided;Falls prevention discussed - Education provided -    FALL RISK PREVENTION PERTAINING TO THE HOME:  Any stairs in or around the home? Yes  If so, are there any without handrails? Yes  Home free of loose throw rugs in walkways, pet beds, electrical cords, etc? Yes  Adequate lighting in your home to reduce risk of falls? Yes   ASSISTIVE DEVICES UTILIZED TO PREVENT FALLS:  Life alert? No  Use of a cane, walker or w/c? No  Grab bars in the bathroom? No  Shower chair or bench in shower? No  Elevated toilet seat or a handicapped toilet? No   TIMED UP AND GO:  Was the test performed? Yes .  Length of time to ambulate 10 feet: 7 sec.   Gait steady and fast without use of assistive device  Cognitive Function: MMSE - Mini Mental State Exam 04/08/2018 03/14/2017  Not completed: (No Data) (No Data)     6CIT Screen 07/21/2019  What Year? 0 points  What month? 0 points  What time? 0 points  Count back from 20 0 points  Months in reverse 0 points  Repeat  phrase 0 points  Total Score 0    Immunizations Immunization History  Administered Date(s) Administered   Fluad Quad(high Dose 65+) 06/09/2019   Influenza, High Dose Seasonal PF 05/06/2017, 06/10/2018   Influenza,inj,Quad PF,6+ Mos 08/20/2013, 03/31/2014   Influenza-Unspecified 05/14/2017   Moderna Sars-Covid-2 Vaccination 08/17/2019, 09/17/2019   Pneumococcal Conjugate-13 08/20/2013   Pneumococcal Polysaccharide-23 02/26/2020   Td 07/23/2008    TDAP status: Due, Education has been provided regarding the importance of this vaccine. Advised may receive this vaccine at local pharmacy or Health Dept. Aware to provide a copy of the vaccination record if obtained from local pharmacy or Health Dept. Verbalized acceptance and understanding.  Flu Vaccine status: Up to date  Pneumococcal vaccine status: Up to date  Covid-19 vaccine status: Completed vaccines  Qualifies for Shingles Vaccine? Yes   Zostavax completed No   Shingrix Completed?: No.    Education has been provided regarding the importance of this vaccine. Patient has been advised to call insurance company to determine out of pocket expense if they have not yet received this vaccine. Advised may also receive vaccine at local pharmacy or Health Dept. Verbalized acceptance and understanding.  Screening Tests Health Maintenance  Topic Date Due   Zoster Vaccines- Shingrix (1 of 2) Never done   TETANUS/TDAP  07/23/2018   COVID-19 Vaccine (3 - Moderna risk series) 10/15/2019   INFLUENZA VACCINE  02/20/2021   COLONOSCOPY (Pts 45-50yrs Insurance coverage will need to be confirmed)  11/06/2021   Hepatitis C Screening  Completed   PNA vac Low Risk Adult  Completed   HPV VACCINES  Aged Out    Health Maintenance  Health Maintenance Due  Topic Date Due   Zoster Vaccines- Shingrix (1 of 2) Never done   TETANUS/TDAP  07/23/2018   COVID-19 Vaccine (3 - Moderna risk  series) 10/15/2019    Colorectal cancer screening: Type of  screening: Colonoscopy. Completed 11/07/2011. Repeat every 10 years  Lung Cancer Screening: (Low Dose CT Chest recommended if Age 57-80 years, 30 pack-year currently smoking OR have quit w/in 15years.) does not qualify.   Lung Cancer Screening Referral: n/a  Additional Screening:  Hepatitis C Screening: does not qualify; Completed 02/24/2019  Vision Screening: Recommended annual ophthalmology exams for early detection of glaucoma and other disorders of the eye. Is the patient up to date with their annual eye exam?  Yes  Who is the provider or what is the name of the office in which the patient attends annual eye exams? Dr.Omen If pt is not established with a provider, would they like to be referred to a provider to establish care? No .   Dental Screening: Recommended annual dental exams for proper oral hygiene  Community Resource Referral / Chronic Care Management: CRR required this visit?  No   CCM required this visit?  No      Plan:     I have personally reviewed and noted the following in the patient's chart:   Medical and social history Use of alcohol, tobacco or illicit drugs  Current medications and supplements including opioid prescriptions. Patient is not currently taking opioid prescriptions. Functional ability and status Nutritional status Physical activity Advanced directives List of other physicians Hospitalizations, surgeries, and ER visits in previous 12 months Vitals Screenings to include cognitive, depression, and falls Referrals and appointments  In addition, I have reviewed and discussed with patient certain preventive protocols, quality metrics, and best practice recommendations. A written personalized care plan for preventive services as well as general preventive health recommendations were provided to patient.     Randel Pigg, LPN   [id number]   Nurse Notes: none

## 2021-02-01 DIAGNOSIS — I4819 Other persistent atrial fibrillation: Secondary | ICD-10-CM | POA: Diagnosis not present

## 2021-02-01 DIAGNOSIS — I429 Cardiomyopathy, unspecified: Secondary | ICD-10-CM | POA: Diagnosis not present

## 2021-02-01 DIAGNOSIS — I4892 Unspecified atrial flutter: Secondary | ICD-10-CM | POA: Diagnosis not present

## 2021-02-17 NOTE — Progress Notes (Signed)
Cardiology Office Note   Date:  02/27/2021   ID:  [name] [name], [name] [name] 02-18-1951, MRN [id number]  PCP:  [name], MD  Cardiologist:   Zaylyn Bergdoll Martinique, MD   Chief Complaint  Patient presents with   Atrial Fibrillation       History of Present Illness: [name] [name] is a 70 y.o. male who is seen for follow up Afib, CAD, AI, and CHF. He has a history of  atrial flutter and CAD. He is status post CABG in 2005. He had a normal stress echo in May of 2013. In August 2015 he presented with atrial flutter with RVR. He had mildly elevated troponins. He had successful TEE guided DCCV. He then underwent ablation of atrial flutter and ectopic atrial tachycardia on 03/30/14 by Dr. [name].   Stress Myoview September 2015 demonstrated excellent exercise tolerance and normal perfusion. TEE showed normal LV function, mild biatrial enlargement, mild MR,TR,AI. He does have moderate obstructive sleep apnea followed by Dr. Radford Pax and is on CPAP therapy.  He was seen in December 2018 with  an irregular and fast pulse. Was found to be in AFib with RVR. Echo showed EF 40-45% with moderate MR and AI and moderate biatrial enlargement. Toprol dose was increased and he was started on Eliquis. Underwent TEE guided DCCV on 07/17/17. Echo showed EF 40% with moderate biatrial enlargement. Moderate to severe MR, moderate TR, patent foramen ovale. He was successfully cardioverted but had recurrence of Afib on 07/24/17. He was seen in Afib clinic with Dr. [name] for consideration of Afib ablation versus AAD therapy. There was discussion about starting him on Tikosyn. Given new LV dysfunction and valvular disease it was felt that right and left heart cath were indicated prior to initiation of AAD therapy.  He also wore a monitor that showed episodes of wide complex tachycardia and it was unclear whether this was aberrancy versus VT.  He got a  second opinion at [facility] with Dr. [name]. He was admitted in February 2019 and  had a nuclear stress test showing no ischemia. EF 44%. He was loaded with Tikosyn and DCCV was performed. he had significant QT prolongation and Tikosyn was discontinued. He later underwent Cardiac MRI. He underwent AFib ablation with pulmonary vein isolation on 10/15/17. Echo following ablation showed EF 50-55%.  Both MRI and Echo showed evidence of moderate to severe AI. LV function had returned to normal with restoration of NSR.  In June 2020 he had recurrent Afib and underwent DCCV at [facility]. On follow up he was noted to be bradycardic and metoprolol dose was reduced. In September 2020 he had an Echo at [facility] showing normal LV function and moderate AI. He is followed regularly by Dr [name] at [facility]. Seen there in August. Echo done showing moderate AI. Normal LV function.  He was seen there at [facility] in October. Note reports NSR with PACs but Ecg.  He presented to the ER on April 27 with fast heart beat: SVT vs fib/flutter.  Adenosine given with short break to 10 beats of VT, then back to SVT with RBBB. DCCV was about to be performed, but he converted on his own when they sat the patient up. He was discharged without admission. He sent monitor strips from 4/28 with Afib in the 170s. BB was increased. Strips were faxed to his [place] cardiologist. Had first occurrence of Afib RVR on March 30 - HR 190s. He converted to NSR. [place] and broke ribs on April  27th. Three dogs jumped on him and he fell and broke 4 ribs on right side (3-6). No head injury, no LOC. Monitor showed Afib 4/28-5/7 - could be related to his rib injury. On subsequent follow up in the office had no recurrence of Afib.   On follow up today he states he feels well. No chest pain or dyspnea. No palpitations since April. Still takes lasix twice a day and swelling is under control. Is careful with salt intake. No orthopnea or PND. BP has been well controlled. He is still exercising 3-5 days a week at a program at [facility].    Past Medical History:   Diagnosis Date   Chicken pox    Coronary artery disease    Severe three-vessel coronary artery disease with ejection fraction of 65%   GERD (gastroesophageal reflux disease)    History of gout    History of psoriasis    Hypercholesterolemia    Hypertension    OSA (obstructive sleep apnea)    Moderate with AHI of 16.9/hr now on CPAP at 10cm H2O   Psoriatic arthritis ([place])     Past Surgical History:  Procedure Laterality Date   ATRIAL FLUTTER ABLATION  03/30/2014   ATRIAL FLUTTER ABLATION N/A 03/30/2014   Procedure: ATRIAL FLUTTER ABLATION;  Surgeon: Coralyn Mark, MD;  Location: [facility] CATH LAB;  Service: Cardiovascular;  Laterality: N/A;   CARDIAC CATHETERIZATION  09/2003   Ejection fraction was estimated at 65%.   CARDIOVERSION N/A 03/15/2014   Procedure: CARDIOVERSION;  Surgeon: Lelon Perla, MD;  Location: [facility] ENDOSCOPY;  Service: Cardiovascular;  Laterality: N/A;   CARDIOVERSION N/A 07/17/2017   Procedure: CARDIOVERSION;  Surgeon: Sueanne Margarita, MD;  Location: [facility] ENDOSCOPY;  Service: Cardiovascular;  Laterality: N/A;   CORONARY ARTERY BYPASS GRAFT  09/2003   [place]-lad,svg-diag,svg-om/distal LCX,svg-pda   TEE WITHOUT CARDIOVERSION N/A 03/15/2014   Procedure: TRANSESOPHAGEAL ECHOCARDIOGRAM (TEE);  Surgeon: Lelon Perla, MD;  Location: [facility] ENDOSCOPY;  Service: Cardiovascular;  Laterality: N/A;   TEE WITHOUT CARDIOVERSION N/A 07/17/2017   Procedure: TRANSESOPHAGEAL ECHOCARDIOGRAM (TEE);  Surgeon: Sueanne Margarita, MD;  Location: [facility] ENDOSCOPY;  Service: Cardiovascular;  Laterality: N/A;   TONSILLECTOMY  1950's     Current Outpatient Medications  Medication Sig Dispense Refill   acetaminophen (TYLENOL) 325 MG tablet Take 2 tablets (650 mg total) by mouth every 6 (six) hours as needed for up to 30 doses for mild pain or moderate pain. 30 tablet 0   atorvastatin (LIPITOR) 80 MG tablet TAKE 1 TABLET BY MOUTH EVERY DAY 90 tablet 3   cholecalciferol (VITAMIN D) 1000 UNITS tablet Take  1,000 Units by mouth daily.     Coenzyme Q10 (CO Q 10 PO) Take 100 mg by mouth daily.      colchicine 0.6 MG tablet TAKE 1 TABLET BY MOUTH TWICE A DAY (Patient taking differently: Take 0.6 mg by mouth daily as needed (gout).) 180 tablet 1   ELIQUIS 5 MG TABS tablet TAKE 1 TABLET BY MOUTH TWICE A DAY 180 tablet 1   furosemide (LASIX) 20 MG tablet Take 1 tablet (20 mg total) by mouth 2 (two) times daily. 180 tablet 3   losartan (COZAAR) 50 MG tablet TAKE 1 TABLET BY MOUTH EVERY DAY 90 tablet 3   methotrexate 2.5 MG tablet Take 2.5 mg by mouth once a week. Caution:Chemotherapy. Protect from light. Take on Tuesdays     metoprolol tartrate (LOPRESSOR) 25 MG tablet Take 0.5 tablets (12.5 mg total) by mouth 2 (  two) times daily as needed (palpitations/heart racing). 12 tablet 0   multivitamin (THERAGRAN) per tablet Take 1 tablet by mouth daily.     nitroGLYCERIN (NITROSTAT) 0.4 MG SL tablet Place 1 tablet (0.4 mg total) under the tongue every 5 (five) minutes as needed for chest pain. 25 tablet 2   potassium chloride SA (K-DUR,KLOR-CON) 20 MEQ tablet Take 1 tablet (20 mEq total) by mouth daily. 30 tablet 6   tadalafil (CIALIS) 20 MG tablet Take one tablet by mouth every other day as needed for erectile dysfunction 10 tablet 5   metoprolol succinate (TOPROL-XL) 50 MG 24 hr tablet Take 1 tablet (50 mg total) by mouth daily. Take with or immediately following a meal. 90 tablet 3   MODERNA COVID-19 VACCINE 100 MCG/0.5ML injection      No current facility-administered medications for this visit.    Allergies:   Patient has no known allergies.    Social History:  The patient  reports that he has quit smoking. His smoking use included cigarettes. He has a 2.50 pack-year smoking history. He has never used smokeless tobacco. He reports current alcohol use of about 4.0 standard drinks of alcohol per week. He reports that he does not use drugs.   Family History:  The patient's family history includes Cancer in  his mother; Heart disease in his mother; Stroke in his mother.    ROS:  Please see the history of present illness.   Otherwise, review of systems are positive for none.   All other systems are reviewed and negative.    PHYSICAL EXAM: VS:  BP 122/66   Pulse 89   Resp 18   Ht '6\' 1"'$  (1.854 m)   Wt 200 lb 3.2 oz (90.8 kg)   SpO2 99%   BMI 26.41 kg/m  , BMI Body mass index is 26.41 kg/m.  GENERAL:  Well appearing WM in NAD HEENT:  PERRL, EOMI, sclera are clear. Oropharynx is clear. NECK:  No jugular venous distention, carotid upstroke brisk and symmetric, soft left subclavian bruit, no thyromegaly or adenopathy LUNGS:  Clear to auscultation bilaterally CHEST:  Unremarkable HEART:  RRR with occ. Extrasystole- regular,  PMI not displaced or sustained,S1 and S2 within normal limits, no S3, no S4: no clicks, no rubs, very soft 1/6 diastolic murmur. ABD:  Soft, nontender. BS +, no masses or bruits. No hepatomegaly, no splenomegaly EXT:  2 + pulses throughout, tr-1+ edema, chronic venous stasis changes. SKIN:  Warm and dry.  No rashes NEURO:  Alert and oriented x 3. Cranial nerves II through XII intact. PSYCH:  Cognitively intact   Recent Labs: 10/19/2020: BUN 10; Creatinine, Ser 0.95; Hemoglobin 16.1; Magnesium 1.8; Platelets 229; Potassium 4.0; Sodium 139  Dated 05/08/17: normal CMET and CBC  Lipid Panel    Component Value Date/Time   CHOL 163 11/30/2019 0827   TRIG 74 11/30/2019 0827   HDL 74 11/30/2019 0827   CHOLHDL 2.2 11/30/2019 0827   CHOLHDL 3 11/23/2015 0837   VLDL 16.4 11/23/2015 0837   LDLCALC 75 11/30/2019 0827      Wt Readings from Last 3 Encounters:  02/27/21 200 lb 3.2 oz (90.8 kg)  01/30/21 205 lb (93 kg)  12/13/20 205 lb 6.4 oz (93.2 kg)     Other studies Reviewed: Additional studies/ records that were reviewed today include: none.  ETT 03/06/17: Study Highlights     Blood pressure demonstrated a hypertensive response to exercise. Upsloping ST segment  depression ST segment depression of 1  mm was noted during stress in the II, III, aVF, V4 and V5 leads, and returning to baseline after less than 1 minute of recovery.   1. Good exercise tolerance.  2. Upsloping ST depression in inferior leads and V4/V5.  This is nonspecific.    No evidence for ischemia.    Echo 07/08/17: Study Conclusions   - Left ventricle: The cavity size was normal. Wall thickness was   increased in a pattern of moderate LVH. Systolic function was   mildly to moderately reduced. The estimated ejection fraction was   in the range of 40% to 45%. - Aortic valve: There was moderate regurgitation. - Mitral valve: There was moderate regurgitation. - Left atrium: The atrium was moderately dilated. - Right atrium: The atrium was moderately dilated.  TEE 07/17/17: Study Conclusions   - Left ventricle: The estimated ejection fraction was in the range   of 40% to 45%. Mild diffuse hypokinesis with no identifiable   regional variations. No evidence of thrombus. - Aortic valve: There was moderate perivalvular regurgitation. - Mitral valve: No evidence of vegetation. There was moderate to   severe regurgitation, with multiple jets. Diastolic regurgitation   was absent. - Left atrium: The atrium was moderately dilated. No evidence of   thrombus in the atrial cavity or appendage. No evidence of   thrombus in the atrial cavity or appendage. Emptying velocity was   reduced. - Right atrium: The atrium was moderately dilated. - Atrial septum: There was a medium-sized fenestrated patent   foramen ovale. A in the fossa ovalis region was present. There   was a moderate bidirectional shunt through a patent foramen   ovale, in the baseline state. - Tricuspid valve: No evidence of vegetation. There was moderate   regurgitation. - Pulmonic valve: No evidence of vegetation.   Impressions:   - Successful cardioversion. No cardiac source of emboli was   indentified.  Event monitor  07/25/17: Study Highlights   Atrial fibrillation Ventricular rates are frequently elevated Frequent nonsustained ventricular tachycardia as well as aberrantly conducted afib are note No sustained ventricular arrhythmias No prolonged pauses or AV block   Cardiac MRI 10/14/17: Cardiac MRI and thoracic MRA with and without contrast was performed on a 1.5 T MRI scanner to evaluate myocardial morphology, function, viability and assess pulmonary vein anatomy in a patient with hx of GERD, HTN, hypercholesterolemia, OSA on CPAP, CAD s/p CABG (LIMA to LAD, svg to D1, sequential svg to OM and distal LCx, and svg to PDA), atrial flutter s/p ablation, atrial fibrillation, and wide complex tachycardia thought to due to SVT with aberrancy.  Transthoracic echo demonstrated an LVEF of 40-45%, normal cavity size, moderate MR, and moderate AR.  Transesophageal echo demonstrated moderate aortic regurgitation, moderate-severe mitral regurgitation, and a PFO.  Nuclear stress demonstrated a mildly dilated LV with no perfusion defect and an EF of 44%.  The patient is scheduled for ablation for atrial fibrillation.  Cardiac MRI 1. The left ventricle is moderately dilated.  Wall thickness is normal.  Regional and global LV systolic function are low normal.  The LVEF is calculated at 59%.  2. The right ventricle is normal in cavity size and wall thickness.  Global RV systolic function is low normal to mildly reduced.  The RVEF is calculated at 50%.  3. Both atria are severely enlarged.  4. The aortic valve is trileaflet in morphology. There is no significant aortic stenosis. There is moderate-severe aortic regurgitation.  The regurgitant orifice area measures 0.26  cm^2.  There is reversal of flow in the proximal descending thoracic aorta.  5.  There is  mild-moderate mitral regurgitation.  There is moderate tricuspid regurgitation.  There is mild pulmonic regurgitation.  6. Delayed enhancement imaging  demonstrates no evidence of myocardial infarction, scarring,  or infiltration.  7.  There is no evidence of an intracardiac thrombus.  8.  The pericardium is normal in thickness.  There is no significant pericardial effusion.  Thoracic MRA 1. There are 3 right sided pulmonary veins.  The right lower and right middle pulmonary veins enter the LA immediately adjacent to one another. The right middle vein is small.  The 2 left sided pulmonary veins enter the LA normally.  All veins are patent without significant stenosis proximally.   MRA bi-orthogonal luminal dimensions are listed below: RUPV: 2.0 x 1.8 cm, 76 cm/sec RMPV: 0.6 x 0.5 cm RLPV: 2.0 x 1.7 cm, 60 cm/ sec LUPV: 1.9 x 1.0 cm, 59 cm/ sec LLPV: 1.9 x 0.9 cm, 60 cm/ sec  MRA LA dimensions Head-foot: 7.1 cm Right-left:  6.2 cm Anterior-posterior:  4.3 cm  2. The thoracic aorta is normal in diameter. There is no evidence of a dissection flap.  3. The main and proximal branch pulmonary arteries are normal in size  4. Systemic venous connections are normal.  5. The esophagus is posterior of the left atrium in close proximity to the ostia of the left-sided pulmonary veins.  The descending thoracic aorta is behind the esophagus and behind the left sided pulmonary Veins.  Echo 10/16/17: INTERPRETATION ---------------------------------------------------------------   NORMAL LEFT VENTRICULAR SYSTOLIC FUNCTION WITH MILD LVH   NORMAL LA PRESSURES WITH DIASTOLIC DYSFUNCTION   NORMAL RIGHT VENTRICULAR SYSTOLIC FUNCTION   VALVULAR REGURGITATION: MODERATE AR, MILD MR, TRIVIAL PR, MILD TR   NO VALVULAR STENOSIS   IRREGULAR RHYTHM   Moderate to severe aortic regurgitation. Mild left ventricular enlargement   suggests color Doppler may underestimate aortic regurgitation severity.   AR VENA CONTRACTA=0.5CM   MILD TO MODERATE TR   NO PRIOR STUDY FOR COMPARISON  Echo 05/02/18: Study Conclusions   - Left ventricle: The cavity size  was mildly dilated. Wall   thickness was increased in a pattern of mild LVH. Systolic   function was normal. The estimated ejection fraction was in the   range of 50% to 55%. Wall motion was normal; there were no   regional wall motion abnormalities. Left ventricular diastolic   function parameters were normal. - Aortic valve: There was mild to moderate regurgitation. - Mitral valve: Calcified annulus. There was mild regurgitation. - Left atrium: The atrium was severely dilated. - Tricuspid valve: There was moderate regurgitation. - Pulmonary arteries: Systolic pressure was mildly increased. PA   peak pressure: 41 mm Hg (S).   Impressions:   - Normal LV systolic function; mild LVE and LVH; mild to moderate   AI; mild MR; severe LAE; moderate TR; mild pulmonary   hypertension.  Echo 03/26/19: INTERPRETATION ---------------------------------------------------------------   NORMAL LEFT VENTRICULAR SYSTOLIC FUNCTION   NORMAL LA PRESSURES WITH DIASTOLIC DYSFUNCTION   NORMAL RIGHT VENTRICULAR SYSTOLIC FUNCTION   VALVULAR REGURGITATION: MODERATE AR, MILD MR, TRIVIAL PR, MILD TR   NO VALVULAR STENOSIS   AR VENA CONTRACTA=0.6CM   MILD TO MODERATE TR   NO PRIOR STUDY FOR COMPARISON   Echo 04/11/20: ECHOCARDIOGRAPHIC DESCRIPTIONS -----------------------------------------------  AORTIC ROOT          Size: MILDLY DILATED    Dissection: INDETERM FOR DISSECTION  AORTIC VALVE      Leaflets: Tricuspid             Morphology: MILDLY THICKENED      Mobility: Fully Mobile   LEFT VENTRICLE                                      Anterior: Normal          Size: MILDLY ENLARGED                        Lateral: Normal   Contraction: Normal                                  Septal: Normal    Closest EF: >55%(Estimated)  Calc.EF: 56% (3D)      Apical: Normal     LV masses: No Masses                             Inferior: Normal           LVH: MILD LVH                             Posterior: Normal   LV  GLS(LOL): -18.0%   LV GLS(AVG): -16.6% Normal Range [ <= -16]  Dias.FxClass: N/A   MITRAL VALVE      Leaflets: Normal                  Mobility: Fully mobile    Morphology: Normal   LEFT ATRIUM          Size: MODERATELY ENLARGED     LA masses: No masses                Normal IAS   MAIN PA          Size: DILATED   PULMONIC VALVE    Morphology: Normal      Mobility: Fully Mobile   RIGHT VENTRICLE          Size: Normal                    Free wall: Normal   Contraction: Normal                    RV masses: No Masses         TAPSE:   2.1 cm,  Normal Range [>= 1.6 cm]       RV Note: ANNULAR VELOCITY=10cm/s   TRICUSPID VALVE      Leaflets: Normal                  Mobility: Fully mobile    Morphology: Normal   RIGHT ATRIUM          Size: MODERATELY ENLARGED        RA Other: None     RA masses: No masses   PERICARDIUM         Fluid: No effusion   INFERIOR VENACAVA          Size: DILATED    Normal respiratory collapse   DOPPLER ECHO and OTHER SPECIAL PROCEDURES ------------------------------------     Aortic: MODERATE AR  No AS      Mitral: MILD MR                No MS     MV Inflow E Vel.= 70.0 cm/s  MV Annulus E'Vel.= 8.5 cm/s  E/E'Ratio= 8   Tricuspid: MILD TR                No TS             2.9 m/s peak TR vel   41 mmHg peak RV pressure   Pulmonary: TRIVIAL PR             No PS       Other:   INTERPRETATION ---------------------------------------------------------------    NORMAL LEFT VENTRICULAR SYSTOLIC FUNCTION WITH MILD LVH    NORMAL RIGHT VENTRICULAR SYSTOLIC FUNCTION    VALVULAR REGURGITATION: MODERATE AR, MILD MR, TRIVIAL PR, MILD TR    NO VALVULAR STENOSIS    MILDLY DILATED LEFT VENTRICLE SUGGESTS POSSIBILITY THAT ECHOCARDIOGRAPHY    UNDERESTIMATES AORTIC REGURGITATION SEVERITY.    AR VENA CONTRACTA=0.6cm    3D acquisition and reconstructions were performed as part of this    examination to more accurately quantify the effects of moderate or  greater    valve regurgitation. (post-processing on an Independent workstation).     Compared with prior Echo study on 03/26/2019: NO SIGNIFICANT CHANGES.    Event monitor 12/07/20: Study Highlights  Atrial fibrillation with RVR and intermittent aberrancy. Patient had returned to NSR on 12/01/20    ASSESSMENT AND PLAN:  1. Atrial fibriillation with RVR. Prior history of atrial flutter and atrial tachycardia ablation in 2015. On anticoagulation with Eliquis 5 mg bid. [place] vasc score of 3. On  Toprol XL dose. S/p TEE guided DCCV but remained in normal rhythm for only about a week. Failed Tikosyn due to QT prolongation. Now s/p Afib ablation at [facility] on 10/15/17. Recurrent Afib in June 2020 with repeat DCCV. Recurrent Afib this spring that has since resolved. ? Related to acute rib fracture. Recommend long term anticoagulation. On Metoprolol.   2. Coronary disease status post CABG in 2005. He is without chest pain. He had a normal Myoview study September 2015. Low risk ETT in August 2018. Repeat nuclear  study at [facility] 2019 showed no ischemia. EF 44%.   3. Chronic Aortic insufficiency.  Pulse pressure normal. No symptoms. Repeat Echo in September 2021 showed no change. LV function still good. Recommend follow up Echo yearly.  4. 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   5. Atrial flutter s/p ablation.   6. OSA- now on CPAP. Followed by Dr. Radford Pax.  7. Hyperlipidemia on statin. Plans to have lab done tomorrow with physical with Dr Elease Hashimoto.   8. HTN - BP is under good control  9. Left subclavian stenosis. Asymptomatic.     Disposition: FU with me in 6 months  Signed, Serayah Yazdani Martinique, MD  02/27/2021 8:33 AM    [facility]

## 2021-02-20 ENCOUNTER — Telehealth: Payer: Self-pay | Admitting: Family Medicine

## 2021-02-20 NOTE — Chronic Care Management (AMB) (Signed)
  Chronic Care Management   Outreach Note  02/20/2021 Name: KAMAHAO STEFFENSMEIER MRN: [id number] DOB: 01-30-1951  Referred by: [name], MD Reason for referral : No chief complaint on file.   An unsuccessful telephone outreach was attempted today. The patient was referred to the pharmacist for assistance with care management and care coordination.   Follow Up Plan:   Tatjana Dellinger Upstream Scheduler

## 2021-02-22 ENCOUNTER — Telehealth: Payer: Self-pay | Admitting: Family Medicine

## 2021-02-22 NOTE — Progress Notes (Signed)
  Chronic Care Management   Note  02/22/2021 Name: HERSEY BROCKUS MRN: [id number] DOB: 07/06/1951  Trinna Balloon Lazcano is a 70 y.o. year old male who is a primary care patient of Burchette, Alinda Sierras, MD. I reached out to [name] by phone today in response to a referral sent by Mr. Kaisen Huesman Saulters's PCP, [name], MD.   Mr. Hemric was given information about Chronic Care Management services today including:  CCM service includes personalized support from designated clinical staff supervised by his physician, including individualized plan of care and coordination with other care providers 24/7 contact phone numbers for assistance for urgent and routine care needs. Service will only be billed when office clinical staff spend 20 minutes or more in a month to coordinate care. Only one practitioner may furnish and bill the service in a calendar month. The patient may stop CCM services at any time (effective at the end of the month) by phone call to the office staff.   Patient agreed to services and verbal consent obtained.   Follow up plan:   Tatjana Secretary/administrator

## 2021-02-23 DIAGNOSIS — L821 Other seborrheic keratosis: Secondary | ICD-10-CM | POA: Diagnosis not present

## 2021-02-23 DIAGNOSIS — L814 Other melanin hyperpigmentation: Secondary | ICD-10-CM | POA: Diagnosis not present

## 2021-02-23 DIAGNOSIS — D1801 Hemangioma of skin and subcutaneous tissue: Secondary | ICD-10-CM | POA: Diagnosis not present

## 2021-02-23 DIAGNOSIS — D485 Neoplasm of uncertain behavior of skin: Secondary | ICD-10-CM | POA: Diagnosis not present

## 2021-02-23 DIAGNOSIS — L400 Psoriasis vulgaris: Secondary | ICD-10-CM | POA: Diagnosis not present

## 2021-02-23 DIAGNOSIS — L905 Scar conditions and fibrosis of skin: Secondary | ICD-10-CM | POA: Diagnosis not present

## 2021-02-23 DIAGNOSIS — Z85820 Personal history of malignant melanoma of skin: Secondary | ICD-10-CM | POA: Diagnosis not present

## 2021-02-23 DIAGNOSIS — L820 Inflamed seborrheic keratosis: Secondary | ICD-10-CM | POA: Diagnosis not present

## 2021-02-27 ENCOUNTER — Ambulatory Visit: Payer: Medicare HMO | Admitting: Cardiology

## 2021-02-27 ENCOUNTER — Other Ambulatory Visit: Payer: Self-pay

## 2021-02-27 ENCOUNTER — Encounter: Payer: Self-pay | Admitting: Cardiology

## 2021-02-27 VITALS — BP 122/66 | HR 89 | Resp 18 | Ht 73.0 in | Wt 200.2 lb

## 2021-02-27 DIAGNOSIS — E7800 Pure hypercholesterolemia, unspecified: Secondary | ICD-10-CM | POA: Diagnosis not present

## 2021-02-27 DIAGNOSIS — I2510 Atherosclerotic heart disease of native coronary artery without angina pectoris: Secondary | ICD-10-CM

## 2021-02-27 DIAGNOSIS — Z951 Presence of aortocoronary bypass graft: Secondary | ICD-10-CM

## 2021-02-27 DIAGNOSIS — I10 Essential (primary) hypertension: Secondary | ICD-10-CM | POA: Diagnosis not present

## 2021-02-27 DIAGNOSIS — I4891 Unspecified atrial fibrillation: Secondary | ICD-10-CM

## 2021-02-27 DIAGNOSIS — I351 Nonrheumatic aortic (valve) insufficiency: Secondary | ICD-10-CM | POA: Diagnosis not present

## 2021-02-28 ENCOUNTER — Encounter: Payer: Self-pay | Admitting: Family Medicine

## 2021-02-28 ENCOUNTER — Ambulatory Visit (INDEPENDENT_AMBULATORY_CARE_PROVIDER_SITE_OTHER): Payer: Medicare HMO | Admitting: Family Medicine

## 2021-02-28 VITALS — BP 120/62 | HR 116 | Temp 97.8°F | Ht 73.0 in | Wt 199.4 lb

## 2021-02-28 DIAGNOSIS — I4819 Other persistent atrial fibrillation: Secondary | ICD-10-CM | POA: Diagnosis not present

## 2021-02-28 DIAGNOSIS — I10 Essential (primary) hypertension: Secondary | ICD-10-CM | POA: Diagnosis not present

## 2021-02-28 DIAGNOSIS — Z0000 Encounter for general adult medical examination without abnormal findings: Secondary | ICD-10-CM | POA: Diagnosis not present

## 2021-02-28 DIAGNOSIS — E7800 Pure hypercholesterolemia, unspecified: Secondary | ICD-10-CM

## 2021-02-28 DIAGNOSIS — M2560 Stiffness of unspecified joint, not elsewhere classified: Secondary | ICD-10-CM

## 2021-02-28 LAB — LIPID PANEL
Cholesterol: 136 mg/dL (ref 0–200)
HDL: 65.6 mg/dL (ref >39.00)
LDL Cholesterol: 59 mg/dL (ref 0–99)
NonHDL: 70.25
Total CHOL/HDL Ratio: 2
Triglycerides: 54 mg/dL (ref 0.0–149.0)
VLDL: 10.8 mg/dL (ref 0.0–40.0)

## 2021-02-28 LAB — CBC WITH DIFFERENTIAL/PLATELET
Basophils Absolute: 0 10*3/uL (ref 0.0–0.1)
Basophils Relative: 0.5 % (ref 0.0–3.0)
Eosinophils Absolute: 0.1 10*3/uL (ref 0.0–0.7)
Eosinophils Relative: 1.2 % (ref 0.0–5.0)
HCT: 44.6 % (ref 39.0–52.0)
Hemoglobin: 15.3 g/dL (ref 13.0–17.0)
Lymphocytes Relative: 17.4 % (ref 12.0–46.0)
Lymphs Abs: 1.1 10*3/uL (ref 0.7–4.0)
MCHC: 34.3 g/dL (ref 30.0–36.0)
MCV: 91.8 fl (ref 78.0–100.0)
Monocytes Absolute: 0.8 10*3/uL (ref 0.1–1.0)
Monocytes Relative: 12.8 % — ABNORMAL HIGH (ref 3.0–12.0)
Neutro Abs: 4.5 10*3/uL (ref 1.4–7.7)
Neutrophils Relative %: 68.1 % (ref 43.0–77.0)
Platelets: 166 10*3/uL (ref 150.0–400.0)
RBC: 4.86 Mil/uL (ref 4.22–5.81)
RDW: 13.9 % (ref 11.5–15.5)
WBC: 6.6 10*3/uL (ref 4.0–10.5)

## 2021-02-28 LAB — HEPATIC FUNCTION PANEL
ALT: 15 U/L (ref 0–53)
AST: 20 U/L (ref 0–37)
Albumin: 4.5 g/dL (ref 3.5–5.2)
Alkaline Phosphatase: 84 U/L (ref 39–117)
Bilirubin, Direct: 0.2 mg/dL (ref 0.0–0.3)
Total Bilirubin: 1.2 mg/dL (ref 0.2–1.2)
Total Protein: 6.8 g/dL (ref 6.0–8.3)

## 2021-02-28 LAB — BASIC METABOLIC PANEL
BUN: 13 mg/dL (ref 6–23)
CO2: 30 mEq/L (ref 19–32)
Calcium: 9.9 mg/dL (ref 8.4–10.5)
Chloride: 101 mEq/L (ref 96–112)
Creatinine, Ser: 0.84 mg/dL (ref 0.40–1.50)
GFR: 87.11 mL/min (ref >60.00)
Glucose, Bld: 91 mg/dL (ref 70–99)
Potassium: 4.3 mEq/L (ref 3.5–5.1)
Sodium: 139 mEq/L (ref 135–145)

## 2021-02-28 LAB — TSH: TSH: 0.78 u[IU]/mL (ref 0.35–5.50)

## 2021-02-28 LAB — PSA, MEDICARE: PSA: 0.62 ng/ml (ref 0.10–4.00)

## 2021-02-28 MED ORDER — TADALAFIL 20 MG PO TABS: ORAL_TABLET | ORAL | 5 refills | Status: DC

## 2021-02-28 NOTE — Progress Notes (Signed)
Established Patient Office Visit  Subjective:  Patient ID: [name] [name], male    DOB: 12/25/1950  Age: 70 y.o. MRN: [id number]  CC:  Chief Complaint  Patient presents with   Annual Exam    Pt complains of general muscle stiffness    HPI [name] [name] presents for physical exam.  Generally doing well.  He does have some daily stiffness involving ankles and knees but also muscles.  He would like to consider physical therapy.  He is doing some supervised exercise classes..  His chronic problems include history of CAD, hypertension, atrial fib, obstructive sleep apnea, psoriasis, gout, hyperlipidemia.  He has had previous bypass.  He remains on low-dose methotrexate per dermatology.  No recent gout flareups.  Maintenance reviewed.  -COVID vaccines up-to-date -Is due for tetanus booster -Gets annual flu vaccines -Declines shingles vaccine -Colonoscopy due next year -Prior hepatitis C screening negative -Pneumonia vaccines complete  Family history and social history reviewed and as below with no significant changes.  Past Medical History:  Diagnosis Date   Chicken pox    Coronary artery disease    Severe three-vessel coronary artery disease with ejection fraction of 65%   GERD (gastroesophageal reflux disease)    History of gout    History of psoriasis    Hypercholesterolemia    Hypertension    OSA (obstructive sleep apnea)    Moderate with AHI of 16.9/hr now on CPAP at 10cm H2O   Psoriatic arthritis ([place])     Past Surgical History:  Procedure Laterality Date   ATRIAL FLUTTER ABLATION  03/30/2014   ATRIAL FLUTTER ABLATION N/A 03/30/2014   Procedure: ATRIAL FLUTTER ABLATION;  Surgeon: Coralyn Mark, MD;  Location: [facility] CATH LAB;  Service: Cardiovascular;  Laterality: N/A;   CARDIAC CATHETERIZATION  09/2003   Ejection fraction was estimated at 65%.   CARDIOVERSION N/A 03/15/2014   Procedure: CARDIOVERSION;  Surgeon: Lelon Perla, MD;  Location: [facility] ENDOSCOPY;  Service:  Cardiovascular;  Laterality: N/A;   CARDIOVERSION N/A 07/17/2017   Procedure: CARDIOVERSION;  Surgeon: Sueanne Margarita, MD;  [facility] ENDOSCOPY;  Service: Cardiovascular;  Laterality: N/A;   CORONARY ARTERY BYPASS GRAFT  09/2003   Lima-lad,svg-diag,svg-om/distal LCX,svg-pda   TEE WITHOUT CARDIOVERSION N/A 03/15/2014   Procedure: TRANSESOPHAGEAL ECHOCARDIOGRAM (TEE);  Surgeon: Lelon Perla, MD;  Location: [facility] ENDOSCOPY;  Service: Cardiovascular;  Laterality: N/A;   TEE WITHOUT CARDIOVERSION N/A 07/17/2017   Procedure: TRANSESOPHAGEAL ECHOCARDIOGRAM (TEE);  Surgeon: Sueanne Margarita, MD;  Location[facility] END[facility]COPY;  Service: Cardiovascular;  Laterality: N/A;   TONSILLECTOMY  1950's    Family History  Problem Relation Age of Onset   Heart disease Mother        cabg   Cancer Mother        breast   Stroke Mother     Social History   Socioeconomic History   Marital status: Married    Spouse name: Not on file   Number of children: 1   Years of education: 4 years college   Highest education level: Bachelor's degree (e.g., BA, AB, BS)  Occupational History   Occupation: Print production planner: [employer]    Comment: works part time   Tobacco Use   Smoking status: Former    Packs/day: 0.50    Years: 5.00    Pack years: 2.50    Types: Cigarettes   Smokeless tobacco: Never   Tobacco comments:    "quit smoking cigarettes in the  late 1970's"  Vaping Use   Vaping Use: Never used  Substance and Sexual Activity   Alcohol use: Yes    Alcohol/week: 4.0 standard drinks    Types: 2 Cans of beer, 2 Shots of liquor per week    Comment: 1-2  drinks a week   Drug use: No   Sexual activity: Yes  Other Topics Concern   Not on file  Social History Narrative   Working part time   Married; [place] of 2   1 child   Social Determinants of Radio broadcast assistant Strain: Low Risk    Difficulty of Paying Living Expenses: Not hard at all  Food Insecurity: No Food  Insecurity   Worried About Charity fundraiser in the Last Year: Never true   Arboriculturist in the Last Year: Never true  Transportation Needs: No Transportation Needs   Lack of Transportation (Medical): No   Lack of Transportation (Non-Medical): No  Physical Activity: Sufficiently Active   Days of Exercise per Week: 5 days   Minutes of Exercise per Session: 60 min  Stress: No Stress Concern Present   Feeling of Stress : Not at all  Social Connections: Socially Integrated   Frequency of Communication with Friends and Family: Twice a week   Frequency of Social Gatherings with Friends and Family: Twice a week   Attends Religious Services: More than 4 times per year   Active Member of [employer] or Organizations: Yes   Attends Music therapist: More than 4 times per year   Marital Status: Married  Human resources officer Violence: Not At Risk   Fear of Current or Ex-Partner: No   Emotionally Abused: No   Physically Abused: No   Sexually Abused: No    Outpatient Medications Prior to Visit  Medication Sig Dispense Refill   acetaminophen (TYLENOL) 325 MG tablet Take 2 tablets (650 mg total) by mouth every 6 (six) hours as needed for up to 30 doses for mild pain or moderate pain. 30 tablet 0   atorvastatin (LIPITOR) 80 MG tablet TAKE 1 TABLET BY MOUTH EVERY DAY 90 tablet 3   cholecalciferol (VITAMIN D) 1000 UNITS tablet Take 1,000 Units by mouth daily.     Coenzyme Q10 (CO Q 10 PO) Take 100 mg by mouth daily.      colchicine 0.6 MG tablet TAKE 1 TABLET BY MOUTH TWICE A DAY (Patient taking differently: Take 0.6 mg by mouth daily as needed (gout).) 180 tablet 1   ELIQUIS 5 MG TABS tablet TAKE 1 TABLET BY MOUTH TWICE A DAY 180 tablet 1   furosemide (LASIX) 20 MG tablet Take 1 tablet (20 mg total) by mouth 2 (two) times daily. 180 tablet 3   losartan (COZAAR) 50 MG tablet TAKE 1 TABLET BY MOUTH EVERY DAY 90 tablet 3   methotrexate 2.5 MG tablet Take 2.5 mg by mouth once a week.  Caution:Chemotherapy. Protect from light. Take on Tuesdays     metoprolol tartrate (LOPRESSOR) 25 MG tablet Take 0.5 tablets (12.5 mg total) by mouth 2 (two) times daily as needed (palpitations/heart racing). 12 tablet 0   MODERNA COVID-19 VACCINE 100 MCG/0.5ML injection      multivitamin (THERAGRAN) per tablet Take 1 tablet by mouth daily.     nitroGLYCERIN (NITROSTAT) 0.4 MG SL tablet Place 1 tablet (0.4 mg total) under the tongue every 5 (five) minutes as needed for chest pain. 25 tablet 2   potassium chloride SA (K-DUR,KLOR-CON) 20 MEQ  tablet Take 1 tablet (20 mEq total) by mouth daily. 30 tablet 6   tadalafil (CIALIS) 20 MG tablet Take one tablet by mouth every other day as needed for erectile dysfunction 10 tablet 5   metoprolol succinate (TOPROL-XL) 50 MG 24 hr tablet Take 1 tablet (50 mg total) by mouth daily. Take with or immediately following a meal. 90 tablet 3   No facility-administered medications prior to visit.    No Known Allergies  ROS Review of Systems  Constitutional:  Negative for activity change, appetite change, fatigue and fever.  HENT:  Negative for congestion, ear pain and trouble swallowing.   Eyes:  Negative for pain and visual disturbance.  Respiratory:  Negative for cough, shortness of breath and wheezing.   Cardiovascular:  Negative for chest pain and palpitations.  Gastrointestinal:  Negative for abdominal distention, abdominal pain, blood in stool, constipation, diarrhea, nausea, rectal pain and vomiting.  Endocrine: Negative for polydipsia and polyuria.  Genitourinary:  Negative for dysuria, hematuria and testicular pain.  Musculoskeletal:  Negative for arthralgias and joint swelling.  Skin:  Positive for rash.  Neurological:  Negative for dizziness, syncope and headaches.  Hematological:  Negative for adenopathy.  Psychiatric/Behavioral:  Negative for confusion and dysphoric mood.      Objective:    Physical Exam Constitutional:      General: He is  not in acute distress.    Appearance: He is well-developed.  HENT:     Head: Normocephalic and atraumatic.     Right Ear: External ear normal.     Left Ear: External ear normal.  Eyes:     Conjunctiva/sclera: Conjunctivae normal.     Pupils: Pupils are equal, round, and reactive to light.  Neck:     Thyroid: No thyromegaly.  Cardiovascular:     Rate and Rhythm: Normal rate and regular rhythm.     Heart sounds: Normal heart sounds. No murmur heard. Pulmonary:     Effort: No respiratory distress.     Breath sounds: No wheezing or rales.  Abdominal:     General: Bowel sounds are normal. There is no distension.     Palpations: Abdomen is soft. There is no mass.     Tenderness: There is no abdominal tenderness. There is no guarding or rebound.  Musculoskeletal:     Cervical back: Normal range of motion and neck supple.  Lymphadenopathy:     Cervical: No cervical adenopathy.  Skin:    Findings: Rash present.     Comments: He has some scattered areas of rash consistent with his diagnosis of psoriasis on his extremity and trunk.  Neurological:     Mental Status: He is alert and oriented to person, place, and time.     Cranial Nerves: No cranial nerve deficit.     Deep Tendon Reflexes: Reflexes normal.    BP 120/62 (BP Location: Left Arm, Patient Position: Sitting, Cuff Size: Normal)   Pulse (!) 116   Temp 97.8 F (36.6 C) (Oral)   Ht '6\' 1"'$  (1.854 m)   Wt 199 lb 6.4 oz (90.4 kg)   SpO2 98%   BMI 26.31 kg/m  Wt Readings from Last 3 Encounters:  02/28/21 199 lb 6.4 oz (90.4 kg)  02/27/21 200 lb 3.2 oz (90.8 kg)  01/30/21 205 lb (93 kg)     Health Maintenance Due  Topic Date Due   TETANUS/TDAP  07/23/2018   INFLUENZA VACCINE  02/20/2021    There are no preventive care reminders to display  for this patient.  Lab Results  Component Value Date   TSH 0.78 02/28/2021   Lab Results  Component Value Date   WBC 6.6 02/28/2021   HGB 15.3 02/28/2021   HCT 44.6 02/28/2021    MCV 91.8 02/28/2021   PLT 166.0 02/28/2021   Lab Results  Component Value Date   NA 139 02/28/2021   K 4.3 02/28/2021   CO2 30 02/28/2021   GLUCOSE 91 02/28/2021   BUN 13 02/28/2021   CREATININE 0.84 02/28/2021   BILITOT 1.2 02/28/2021   ALKPHOS 84 02/28/2021   AST 20 02/28/2021   ALT 15 02/28/2021   PROT 6.8 02/28/2021   ALBUMIN 4.5 02/28/2021   CALCIUM 9.9 02/28/2021   ANIONGAP 11 10/19/2020   GFR 87.11 02/28/2021   Lab Results  Component Value Date   CHOL 136 02/28/2021   Lab Results  Component Value Date   HDL 65.60 02/28/2021   Lab Results  Component Value Date   LDLCALC 59 02/28/2021   Lab Results  Component Value Date   TRIG 54.0 02/28/2021   Lab Results  Component Value Date   CHOLHDL 2 02/28/2021   No results found for: HGBA1C    Assessment & Plan:   #1 physical exam.  We discussed the following health maintenance issues  -Obtain screening labs. -The natural history of prostate cancer and ongoing controversy regarding screening and potential treatment outcomes of prostate cancer has been discussed with the patient. The meaning of a false positive PSA and a false negative PSA has been discussed. He indicates understanding of the limitations of this screening test and wishes to proceed with screening PSA testing. -Continue with annual flu vaccine -Discussed shingles vaccine but declines at this time -Refilled Cialis.  He knows this cannot be mixed with nitroglycerin.  He has not required nitroglycerin use in 20 years. -Discussed setting up physical therapy secondary to his muscle stiffness and for general strengthening    Meds ordered this encounter  Medications   tadalafil (CIALIS) 20 MG tablet    Sig: Take one tablet by mouth every other day as needed for erectile dysfunction    Dispense:  10 tablet    Refill:  5    Follow-up: No follow-ups on file.    [name], MD

## 2021-03-02 ENCOUNTER — Ambulatory Visit: Payer: Medicare HMO | Attending: Family Medicine | Admitting: Physical Therapy

## 2021-03-02 ENCOUNTER — Other Ambulatory Visit: Payer: Self-pay

## 2021-03-02 DIAGNOSIS — M25662 Stiffness of left knee, not elsewhere classified: Secondary | ICD-10-CM | POA: Insufficient documentation

## 2021-03-02 DIAGNOSIS — M25661 Stiffness of right knee, not elsewhere classified: Secondary | ICD-10-CM | POA: Insufficient documentation

## 2021-03-02 DIAGNOSIS — R293 Abnormal posture: Secondary | ICD-10-CM | POA: Insufficient documentation

## 2021-03-02 DIAGNOSIS — R252 Cramp and spasm: Secondary | ICD-10-CM | POA: Diagnosis not present

## 2021-03-02 NOTE — Therapy (Signed)
[facility] [address], [place], [place], [address] Phone: [phone]   Fax:  [phone]  Physical Therapy Evaluation  Patient Details  Name: [name] [name] MRN: [id number] Date of Birth: 1950-07-27 Referring Provider (PT): [name], MD   Encounter Date: 03/02/2021   PT End of Session - 03/02/21 1050     Visit Number 1    Date for PT Re-Evaluation 04/13/21    Authorization Type [employer] Medicare    Progress Note Due on Visit 10    PT Start Time 0850    PT Stop Time 0930    PT Time Calculation (min) 40 min    Activity Tolerance Patient tolerated treatment well    Behavior During Therapy [facility] for tasks assessed/performed             Past Medical History:  Diagnosis Date   Chicken pox    Coronary artery disease    Severe three-vessel coronary artery disease with ejection fraction of 65%   GERD (gastroesophageal reflux disease)    History of gout    History of psoriasis    Hypercholesterolemia    Hypertension    OSA (obstructive sleep apnea)    Moderate with AHI of 16.9/hr now on CPAP at 10cm H2O   Psoriatic arthritis ([place])     Past Surgical History:  Procedure Laterality Date   ATRIAL FLUTTER ABLATION  03/30/2014   ATRIAL FLUTTER ABLATION N/A 03/30/2014   Procedure: ATRIAL FLUTTER ABLATION;  Surgeon: Coralyn Mark, MD;  Location: [place] CATH LAB;  Service: Cardiovascular;  Laterality: N/A;   CARDIAC CATHETERIZATION  09/2003   Ejection fraction was estimated at 65%.   CARDIOVERSION N/A 03/15/2014   Procedure: CARDIOVERSION;  Surgeon: Lelon Perla, MD;  Location: [facility] ENDOSCOPY;  Service: Cardiovascular;  Laterality: N/A;   CARDIOVERSION N/A 07/17/2017   Procedure: CARDIOVERSION;  Surgeon: Sueanne Margarita, MD;  Location: [facility] ENDOSCOPY;  Service: Cardiovascular;  Laterality: N/A;   CORONARY ARTERY BYPASS GRAFT  09/2003   Lima-lad,svg-diag,svg-om/distal LCX,svg-pda   TEE WITHOUT CARDIOVERSION N/A  03/15/2014   Procedure: TRANSESOPHAGEAL ECHOCARDIOGRAM (TEE);  Surgeon: Lelon Perla, MD;  Location: [facility] ENDOSCOPY;  Service: Cardiovascular;  Laterality: N/A;   TEE WITHOUT CARDIOVERSION N/A 07/17/2017   Procedure: TRANSESOPHAGEAL ECHOCARDIOGRAM (TEE);  Surgeon: Sueanne Margarita, MD;  Location: [facility] ENDOSCOPY;  Service: Cardiovascular;  Laterality: N/A;   TONSILLECTOMY  1950's    There were no vitals filed for this visit.    Subjective Assessment - 03/02/21 0859     Subjective Patient presenting due to general stiffness. Feels that this limits actiivty. Has gout flare ups every 3-4 months which mostly affects ankles, knees, and elbows. Notes flare ups have been less frequent    Currently in Pain? No/denies                [facility] PT Assessment - 03/02/21 0001       Assessment   Medical Diagnosis generalized stiffness    Referring Provider (PT) [name], MD    Onset Date/Surgical Date --   chronic   Hand Dominance Right    Next MD Visit None    Prior Therapy Yes      Precautions   Precautions None      Restrictions   Weight Bearing Restrictions No      Balance Screen   Has the patient fallen in the past 6 months Yes    How many times? 1   bumped by  2 large dogs from behind which resulted in 4 rib fractures   Has the patient had a decrease in activity level because of a fear of falling?  No    Is the patient reluctant to leave their home because of a fear of falling?  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      Cognition   Overall Cognitive Status Within Functional Limits for tasks assessed      Observation/Other Assessments   Other Surveys  The [place] and [employer] Osteoarthritis Index [facility])    The [place] and  [employer] Osteoarthritis Index [facility])  27%      Posture/Postural Control   Posture/Postural Control Postural limitations    Postural Limitations Increased thoracic kyphosis;Rounded Shoulders      ROM / Strength   AROM / PROM / Strength AROM;Strength      AROM   AROM Assessment Site Cervical;Knee    Right Knee Flexion 125    Left Knee Flexion 120   pain with end range knee flexion   Cervical - Right Side Bend 12    Cervical - Left Side Bend 15      Strength   Overall Strength Within functional limits for tasks performed    Overall Strength Comments bil UE/LE strength grossly 5/5      Flexibility   Soft Tissue Assessment /Muscle Length yes    Hamstrings impaired bilaterally; therapist able to passively extend knee to approx 60 degrees flexion in supine    Quadriceps impaired bilaterally      Special Tests    Special Tests Hip Special Tests;Meniscus Tests    Hip Special Tests  Saralyn Pilar [facility]) Test;Hip Scouring    Meniscus Tests McMurray Test      Saralyn Pilar [facility]) Test   Findings Negative      Hip Scouring   Findings Negative      McMurray Test   Findings Negative    Side Left      Bed Mobility   Bed Mobility Rolling Right;Rolling Left;Supine to Sit;Sit to Supine    Rolling Right Independent    Rolling Left Independent    Supine to Sit Independent    Sit to Supine Independent      Transfers   Comments Independent with all transfers                        Objective measurements completed on examination: See above findings.               PT Education - 03/02/21 1038     Education Details Access Code: [email or web address]    Person(s) Educated Patient    Methods Explanation;Demonstration;Verbal cues;Tactile cues;Handout    Comprehension Verbalized understanding;Returned demonstration;Verbal cues required;Tactile cues required              PT Short Term Goals - 03/02/21 1044       PT SHORT TERM GOAL #1   Title Patient will be  independent with HEP for continued progression at home.    Time 3    Period Weeks    Status New    Target Date 03/23/21      PT SHORT TERM GOAL #2   Title Patient  will demonstrate at least 25 degrees cervical sidebending bilaterally to more readily perform daily tasks.    Baseline Lt 15; Rt 12    Time 3    Period Weeks    Status New    Target Date 03/23/21               PT Long Term Goals - 03/02/21 1046       PT LONG TERM GOAL #1   Title Patient will be independent with advanced HEP for long term management of symptoms post D/C.    Time 6    Period Weeks    Status New    Target Date 04/13/21      PT LONG TERM GOAL #2   Title Patient will demonstrate supine passive knee extension to -30 degrees to indicate improved mobility.    Baseline bilateral lacking approx 60 degrees extension    Time 6    Period Weeks    Status New    Target Date 04/13/21      PT LONG TERM GOAL #3   Title Patient will score 15% or less on WOMAC to indicate improved activity tolerance due to decreased joint stiffness.    Baseline 27%    Time 6    Period Weeks    Status New    Target Date 04/13/21                    Plan - 03/02/21 1038     Clinical Impression Statement Patient is a 70 y/o male referred due to generalized stiffness. PMH includes psoriatic arthritis, gout, HTN, and A-fib. Patient reported activity limitations include bending. He exhibits no gross strength impairments. Bilateral cervical lateral flexion AROM impaired. Patient demos significant bilateral hamstring length deficits. WOMAC score indicates 27% impairment. Patient would benefit from skilled therapeutic intervention to address impairments for improved functional mobility.    Personal Factors and Comorbidities Comorbidity 2    Comorbidities psoriatic arthritis, gout    Examination-Activity Limitations [place]    Stability/Clinical Decision Making Stable/Uncomplicated    Clinical Decision Making Low    Rehab  Potential Excellent    PT Frequency 1x / week    PT Duration 6 weeks    PT Treatment/Interventions ADLs/Self Care Home Management;Iontophoresis '4mg'$ /ml Dexamethasone;Moist Heat;Cryotherapy;Functional mobility training;Therapeutic activities;Therapeutic exercise;Balance training;Patient/family education;Manual techniques;Passive range of motion;Dry needling;Joint Manipulations    PT Next Visit Plan review HEP; begin active stretching/mobility program; global hip/lumbar mobility    PT Home Exercise Plan Access Code: [email or web address]    Consulted and Agree with Plan of Care Patient             Patient will benefit from skilled therapeutic intervention in order to improve the following deficits and impairments:  Decreased mobility, Decreased range of motion, Hypomobility, Impaired flexibility, Increased fascial restricitons, Increased muscle spasms, Postural dysfunction, Improper body mechanics  Visit Diagnosis: Abnormal posture - Plan: PT plan of care cert/re-cert  Cramp and spasm - Plan: PT plan of care cert/re-cert  Stiffness of left knee, not elsewhere classified - Plan: PT plan of care cert/re-cert  Stiffness of right knee, not elsewhere classified - Plan: PT plan of care cert/re-cert     Problem List Patient Active Problem List   Diagnosis Date Noted   Psoriatic arthritis ([place])    History of psoriasis    History of gout    GERD (gastroesophageal reflux disease)    Chicken pox    Persistent atrial fibrillation ([facility])    Erectile dysfunction  05/06/2017   Overweight (BMI 25.0-29.9) 08/06/2016   OSA (obstructive sleep apnea) 08/06/2014   Snoring 05/22/2014   Hypertension    Atrial flutter ([place]) 03/14/2014   Psoriasis 02/27/2013   Gout 02/27/2013   S/P CABG (coronary artery bypass graft) 06/29/2011   Coronary artery disease    Hypercholesterolemia    Everardo All PT, DPT  03/02/21 10:53 AM   [facility] [address]  [place], [place], [address] Phone: [phone]   Fax:  [phone]  Name: [name] [name] MRN: [id number] Date of Birth: [birth year]

## 2021-03-02 NOTE — Patient Instructions (Signed)
Access Code: [email or web address] URL: [email or web address] Date: 03/02/2021 Prepared by: Everardo All  Exercises Supine Hamstring Stretch with Doorway - 1 x daily - 7 x weekly - 3 sets - 10 reps Hooklying Active Hamstring Stretch - 1 x daily - 7 x weekly - 3 sets - 10 reps Hooklying Single Knee to Chest Stretch with Towel - 1 x daily - 7 x weekly - 3 sets - 10 reps Prone Quadriceps Stretch with Strap - 1 x daily - 7 x weekly - 3 sets - 10 reps

## 2021-03-08 ENCOUNTER — Other Ambulatory Visit: Payer: Self-pay

## 2021-03-08 ENCOUNTER — Ambulatory Visit: Payer: Medicare HMO | Admitting: Physical Therapy

## 2021-03-08 DIAGNOSIS — M25661 Stiffness of right knee, not elsewhere classified: Secondary | ICD-10-CM

## 2021-03-08 DIAGNOSIS — R293 Abnormal posture: Secondary | ICD-10-CM

## 2021-03-08 DIAGNOSIS — R252 Cramp and spasm: Secondary | ICD-10-CM | POA: Diagnosis not present

## 2021-03-08 DIAGNOSIS — M25662 Stiffness of left knee, not elsewhere classified: Secondary | ICD-10-CM

## 2021-03-08 NOTE — Patient Instructions (Signed)
Sidelying Open Book Thoracic Lumbar Rotation and Extension - 1 x daily - 7 x weekly - 1 sets - 3 reps - 10s hold Supine Piriformis Stretch with Leg Straight - 1 x daily - 7 x weekly - 1 sets - 2 reps - 20-30s hold

## 2021-03-08 NOTE — Therapy (Signed)
[facility] [address] [place], [place], [address] Phone: [phone]   Fax:  [phone]  Physical Therapy Treatment  Patient Details  Name: [name] [name] MRN: [id number] Date of Birth: 1951/04/23 Referring Provider (PT): [name], MD   Encounter Date: 03/08/2021   PT End of Session - 03/08/21 1223     Visit Number 2    Date for PT Re-Evaluation 04/13/21    Authorization Type [employer]    Progress Note Due on Visit 10    PT Start Time 1103    PT Stop Time 1141    PT Time Calculation (min) 38 min    Activity Tolerance Patient tolerated treatment well    Behavior During Therapy [facility] for tasks assessed/performed             Past Medical History:  Diagnosis Date   Chicken pox    Coronary artery disease    Severe three-vessel coronary artery disease with ejection fraction of 65%   GERD (gastroesophageal reflux disease)    History of gout    History of psoriasis    Hypercholesterolemia    Hypertension    OSA (obstructive sleep apnea)    Moderate with AHI of 16.9/hr now on CPAP at 10cm H2O   Psoriatic arthritis ([place])     Past Surgical History:  Procedure Laterality Date   ATRIAL FLUTTER ABLATION  03/30/2014   ATRIAL FLUTTER ABLATION N/A 03/30/2014   Procedure: ATRIAL FLUTTER ABLATION;  Surgeon: Coralyn Mark, MD;  Location: [place] CATH LAB;  Service: Cardiovascular;  Laterality: N/A;   CARDIAC CATHETERIZATION  09/2003   Ejection fraction was estimated at 65%.   CARDIOVERSION N/A 03/15/2014   Procedure: CARDIOVERSION;  Surgeon: Lelon Perla, MD;  Location: [facility] ENDOSCOPY;  Service: Cardiovascular;  Laterality: N/A;   CARDIOVERSION N/A 07/17/2017   Procedure: CARDIOVERSION;  Surgeon: Sueanne Margarita, MD;  Location: [facility] ENDOSCOPY;  Service: Cardiovascular;  Laterality: N/A;   CORONARY ARTERY BYPASS GRAFT  09/2003   Lima-lad,svg-diag,svg-om/distal LCX,svg-pda   TEE WITHOUT CARDIOVERSION N/A  03/15/2014   Procedure: TRANSESOPHAGEAL ECHOCARDIOGRAM (TEE);  Surgeon: Lelon Perla, MD;  Location: [facility] ENDOSCOPY;  Service: Cardiovascular;  Laterality: N/A;   TEE WITHOUT CARDIOVERSION N/A 07/17/2017   Procedure: TRANSESOPHAGEAL ECHOCARDIOGRAM (TEE);  Surgeon: Sueanne Margarita, MD;  Location: [facility] ENDOSCOPY;  Service: Cardiovascular;  Laterality: N/A;   TONSILLECTOMY  1950's    There were no vitals filed for this visit.                      [facility] Adult PT Treatment/Exercise - 03/08/21 0001       Exercises   Exercises Lumbar;Knee/Hip      Lumbar Exercises: Stretches   Standing Side Bend Right;Left;3 reps;10 seconds    Standing Side Bend Limitations with foward lunge and hand hold x1    Quadruped Mid Back Stretch 3 reps;10 seconds    Quadruped Mid Back Stretch Limitations kneeling at mat table; black foam under knees    Piriformis Stretch Right;Left;2 reps;30 seconds    Piriformis Stretch Limitations supine    Other Lumbar Stretch Exercise resisted thoracic rotation; red tband; x10 repetitions Lt/Rt    Other Lumbar Stretch Exercise open books with foam roll; 3 x 10 sec hold Lt/Rt      Lumbar Exercises: Aerobic   Elliptical cross ramp 1; L3 x 4 minutes      Knee/Hip Exercises: Standing   Other Standing Knee Exercises  active stretching at back counter: high knees walking; frog walk (walking with hip external rotation)                    PT Education - 03/08/21 1221     Education Details open books, supine piriformis stretch    Person(s) Educated Patient    Methods Explanation;Demonstration;Tactile cues;Verbal cues;Handout    Comprehension Verbalized understanding;Returned demonstration;Verbal cues required;Tactile cues required              PT Short Term Goals - 03/08/21 1223       PT SHORT TERM GOAL #1   Title Patient will be independent with HEP for continued progression at home.    Time 3    Period Weeks    Status On-going    Target  Date 03/23/21               PT Long Term Goals - 03/02/21 1046       PT LONG TERM GOAL #1   Title Patient will be independent with advanced HEP for long term management of symptoms post D/C.    Time 6    Period Weeks    Status New    Target Date 04/13/21      PT LONG TERM GOAL #2   Title Patient will demonstrate supine passive knee extension to -30 degrees to indicate improved mobility.    Baseline bilateral lacking approx 60 degrees extension    Time 6    Period Weeks    Status New    Target Date 04/13/21      PT LONG TERM GOAL #3   Title Patient will score 15% or less on WOMAC to indicate improved activity tolerance due to decreased joint stiffness.    Baseline 27%    Time 6    Period Weeks    Status New    Target Date 04/13/21                   Plan - 03/08/21 1221     Clinical Impression Statement Patient reports decreased stiffness at end of session. Intermittent cuing provided for continued breathing with all stretches as patient demos frequent shallow level breathing. He reports decreased general stiffness at end of session. Would benefit from continued progress with current POC and focus on active mobility.    Personal Factors and Comorbidities Comorbidity 2    Comorbidities psoriatic arthritis, gout    Examination-Activity Limitations Bend    Rehab Potential Excellent    PT Frequency 1x / week    PT Duration 6 weeks    PT Treatment/Interventions ADLs/Self Care Home Management;Iontophoresis '4mg'$ /ml Dexamethasone;Moist Heat;Cryotherapy;Functional mobility training;Therapeutic activities;Therapeutic exercise;Balance training;Patient/family education;Manual techniques;Passive range of motion;Dry needling;Joint Manipulations    PT Next Visit Plan continue active stretching/mobility program; global hip/lumbar mobility    PT Home Exercise Plan Access Code: [email or web address]    Consulted and Agree with Plan of Care Patient             Patient will benefit from  skilled therapeutic intervention in order to improve the following deficits and impairments:  Decreased mobility, Decreased range of motion, Hypomobility, Impaired flexibility, Increased fascial restricitons, Increased muscle spasms, Postural dysfunction, Improper body mechanics  Visit Diagnosis: Abnormal posture  Stiffness of left knee, not elsewhere classified  Stiffness of right knee, not elsewhere classified  Cramp and spasm     Problem List Patient Active Problem List   Diagnosis Date Noted   Psoriatic arthritis ([place])  History of psoriasis    History of gout    GERD (gastroesophageal reflux disease)    Chicken pox    Persistent atrial fibrillation ([facility])    Erectile dysfunction 05/06/2017   Overweight (BMI 25.0-29.9) 08/06/2016   OSA (obstructive sleep apnea) 08/06/2014   Snoring 05/22/2014   Hypertension    Atrial flutter ([place]) 03/14/2014   Psoriasis 02/27/2013   Gout 02/27/2013   S/P CABG (coronary artery bypass graft) 06/29/2011   Coronary artery disease    Hypercholesterolemia    Everardo All PT, DPT 03/08/21 12:24 PM   [facility] [address], [place], [place], [address] Phone: [phone]   Fax:  [phone]  Name: [name] [name] MRN: [id number] Date of Birth: 04-12-1951

## 2021-03-15 ENCOUNTER — Other Ambulatory Visit: Payer: Self-pay

## 2021-03-15 ENCOUNTER — Ambulatory Visit: Payer: Medicare HMO | Admitting: Physical Therapy

## 2021-03-15 DIAGNOSIS — R252 Cramp and spasm: Secondary | ICD-10-CM | POA: Diagnosis not present

## 2021-03-15 DIAGNOSIS — M25661 Stiffness of right knee, not elsewhere classified: Secondary | ICD-10-CM

## 2021-03-15 DIAGNOSIS — R293 Abnormal posture: Secondary | ICD-10-CM | POA: Diagnosis not present

## 2021-03-15 DIAGNOSIS — M25662 Stiffness of left knee, not elsewhere classified: Secondary | ICD-10-CM

## 2021-03-15 NOTE — Patient Instructions (Signed)
Seated Neck Sidebending Stretch with Pillow - 1 x daily - 7 x weekly - 1 sets - 2 reps - 20-30s hold

## 2021-03-15 NOTE — Therapy (Signed)
[facility] [address] [place], [place], [address] Phone: [phone]   Fax:  [phone]  Physical Therapy Treatment  Patient Details  Name: [name] [name] MRN: [id number] Date of Birth: 04/17/1951 Referring Provider (PT): [name], MD   Encounter Date: 03/15/2021   PT End of Session - 03/15/21 1717     Visit Number 3    Date for PT Re-Evaluation 04/13/21    Authorization Type [employer] Medicare    Progress Note Due on Visit 10    PT Start Time 1103    PT Stop Time [id number]    PT Time Calculation (min) 42 min    Activity Tolerance Patient tolerated treatment well    Behavior During Therapy [facility] for tasks assessed/performed             Past Medical History:  Diagnosis Date   Chicken pox    Coronary artery disease    Severe three-vessel coronary artery disease with ejection fraction of 65%   GERD (gastroesophageal reflux disease)    History of gout    History of psoriasis    Hypercholesterolemia    Hypertension    OSA (obstructive sleep apnea)    Moderate with AHI of 16.9/hr now on CPAP at 10cm H2O   Psoriatic arthritis ([place])     Past Surgical History:  Procedure Laterality Date   ATRIAL FLUTTER ABLATION  03/30/2014   ATRIAL FLUTTER ABLATION N/A 03/30/2014   Procedure: ATRIAL FLUTTER ABLATION;  Surgeon: Coralyn Mark, MD;  Location: [place] CATH LAB;  Service: Cardiovascular;  Laterality: N/A;   CARDIAC CATHETERIZATION  09/2003   Ejection fraction was estimated at 65%.   CARDIOVERSION N/A 03/15/2014   Procedure: CARDIOVERSION;  Surgeon: Lelon Perla, MD;  Location: [facility] ENDOSCOPY;  Service: Cardiovascular;  Laterality: N/A;   CARDIOVERSION N/A 07/17/2017   Procedure: CARDIOVERSION;  Surgeon: Sueanne Margarita, MD;  Location: [facility] ENDOSCOPY;  Service: Cardiovascular;  Laterality: N/A;   CORONARY ARTERY BYPASS GRAFT  09/2003   Lima-lad,svg-diag,svg-om/distal LCX,svg-pda   TEE WITHOUT CARDIOVERSION N/A  03/15/2014   Procedure: TRANSESOPHAGEAL ECHOCARDIOGRAM (TEE);  Surgeon: Lelon Perla, MD;  Location: [facility] ENDOSCOPY;  Service: Cardiovascular;  Laterality: N/A;   TEE WITHOUT CARDIOVERSION N/A 07/17/2017   Procedure: TRANSESOPHAGEAL ECHOCARDIOGRAM (TEE);  Surgeon: Sueanne Margarita, MD;  Location: [facility] ENDOSCOPY;  Service: Cardiovascular;  Laterality: N/A;   TONSILLECTOMY  1950's    There were no vitals filed for this visit.   Subjective Assessment - 03/15/21 1713     Subjective Remains compliant with HEP at least 1x/day. Has noticed some improvement with regards to mobility.    Currently in Pain? No/denies                               [facility] Adult PT Treatment/Exercise - 03/15/21 0001       Lumbar Exercises: Stretches   Prone on Elbows Stretch 5 reps;10 seconds    Piriformis Stretch Right;Left;2 reps;30 seconds    Piriformis Stretch Limitations supine    Other Lumbar Stretch Exercise lumbar flexion; 3 x 10 sec    Other Lumbar Stretch Exercise open books with foam roll; 3 x 10 sec hold Lt/Rt      Lumbar Exercises: Standing   Row Right;Left;10 reps    Theraband Level (Row) Level 3 (Green)    Row Limitations row into thoracic rotation    Other Standing Lumbar Exercises prayer stretch  at high table; 2 x 20 sec hold    Other Standing Lumbar Exercises thread the needle at raised table; x5 repetitions Lt/Rt      Knee/Hip Exercises: Standing   Other Standing Knee Exercises active stretching at back counter: high knees walking; frog walk (walking with hip external rotation)                    PT Education - 03/15/21 1714     Education Details seated upper trap stretch    Person(s) Educated Patient    Methods Explanation;Demonstration;Tactile cues;Verbal cues;Handout    Comprehension Verbalized understanding;Returned demonstration;Verbal cues required;Tactile cues required              PT Short Term Goals - 03/15/21 1716       PT SHORT TERM GOAL  #1   Title Patient will be independent with HEP for continued progression at home.    Time 3    Period Weeks    Status Achieved    Target Date 03/23/21      PT SHORT TERM GOAL #2   Title Patient will demonstrate at least 25 degrees cervical sidebending bilaterally to more readily perform daily tasks.    Baseline Lt 15; Rt 12    Time 3    Period Weeks    Status On-going               PT Long Term Goals - 03/02/21 1046       PT LONG TERM GOAL #1   Title Patient will be independent with advanced HEP for long term management of symptoms post D/C.    Time 6    Period Weeks    Status New    Target Date 04/13/21      PT LONG TERM GOAL #2   Title Patient will demonstrate supine passive knee extension to -30 degrees to indicate improved mobility.    Baseline bilateral lacking approx 60 degrees extension    Time 6    Period Weeks    Status New    Target Date 04/13/21      PT LONG TERM GOAL #3   Title Patient will score 15% or less on WOMAC to indicate improved activity tolerance due to decreased joint stiffness.    Baseline 27%    Time 6    Period Weeks    Status New    Target Date 04/13/21                   Plan - 03/15/21 1714     Clinical Impression Statement Patient reports decreased stiffness at end of session. Cuing provided for isolated cervical lateral flexion for upper trap stretch as patient having increased difficulty avoiding cervical rotation. Cuing provided for slowed pacing with row into thoracic rotation. Patient would benefit from continued skilled intervention for improved functional mobility.    Personal Factors and Comorbidities Comorbidity 2    Comorbidities psoriatic arthritis, gout    Examination-Activity Limitations Bend    Rehab Potential Excellent    PT Frequency 1x / week    PT Duration 6 weeks    PT Treatment/Interventions ADLs/Self Care Home Management;Iontophoresis '4mg'$ /ml Dexamethasone;Moist Heat;Cryotherapy;Functional mobility  training;Therapeutic activities;Therapeutic exercise;Balance training;Patient/family education;Manual techniques;Passive range of motion;Dry needling;Joint Manipulations    PT Next Visit Plan progress active stretching and global mobility    PT Home Exercise Plan Access Code: [email or web address]    Consulted and Agree with Plan of Care Patient  Patient will benefit from skilled therapeutic intervention in order to improve the following deficits and impairments:  Decreased mobility, Decreased range of motion, Hypomobility, Impaired flexibility, Increased fascial restricitons, Increased muscle spasms, Postural dysfunction, Improper body mechanics  Visit Diagnosis: Abnormal posture  Stiffness of left knee, not elsewhere classified  Stiffness of right knee, not elsewhere classified  Cramp and spasm     Problem List Patient Active Problem List   Diagnosis Date Noted   Psoriatic arthritis ([place])    History of psoriasis    History of gout    GERD (gastroesophageal reflux disease)    Chicken pox    Persistent atrial fibrillation ([place])    Erectile dysfunction 05/06/2017   Overweight (BMI 25.0-29.9) 08/06/2016   OSA (obstructive sleep apnea) 08/06/2014   Snoring 05/22/2014   Hypertension    Atrial flutter ([place]) 03/14/2014   Psoriasis 02/27/2013   Gout 02/27/2013   S/P CABG (coronary artery bypass graft) 06/29/2011   Coronary artery disease    Hypercholesterolemia     Everardo All PT, DPT 03/15/21 5:18 PM   [facility] [address] [place], [place], [address] Phone: [phone]   Fax:  [phone]  Name: [name] [name] MRN: [id number] Date of Birth: 1951/01/01

## 2021-03-22 ENCOUNTER — Ambulatory Visit: Payer: Medicare HMO

## 2021-03-22 ENCOUNTER — Other Ambulatory Visit: Payer: Self-pay

## 2021-03-22 DIAGNOSIS — R293 Abnormal posture: Secondary | ICD-10-CM

## 2021-03-22 DIAGNOSIS — M25662 Stiffness of left knee, not elsewhere classified: Secondary | ICD-10-CM | POA: Diagnosis not present

## 2021-03-22 DIAGNOSIS — M25661 Stiffness of right knee, not elsewhere classified: Secondary | ICD-10-CM

## 2021-03-22 DIAGNOSIS — R252 Cramp and spasm: Secondary | ICD-10-CM | POA: Diagnosis not present

## 2021-03-22 NOTE — Therapy (Signed)
[facility] [address], [place], [place], [address] Phone: [phone]   Fax:  [phone]  Physical Therapy Treatment  Patient Details  Name: [name] [name] MRN: [id number] Date of Birth: 1950/09/14 Referring Provider (PT): [name], MD   Encounter Date: 03/22/2021   PT End of Session - 03/22/21 1142     Visit Number 4    Date for PT Re-Evaluation 04/13/21    Authorization Type [employer]na Medicare    Progress Note Due on Visit 10    PT Start Time 1102    PT Stop Time 1144    PT Time Calculation (min) 42 min    Activity Tolerance Patient tolerated treatment well    Behavior During Therapy [facility] for tasks assessed/performed             Past Medical History:  Diagnosis Date   Chicken pox    Coronary artery disease    Severe three-vessel coronary artery disease with ejection fraction of 65%   GERD (gastroesophageal reflux disease)    History of gout    History of psoriasis    Hypercholesterolemia    Hypertension    OSA (obstructive sleep apnea)    Moderate with AHI of 16.9/hr now on CPAP at 10cm H2O   Psoriatic arthritis ([place])     Past Surgical History:  Procedure Laterality Date   ATRIAL FLUTTER ABLATION  03/30/2014   ATRIAL FLUTTER ABLATION N/A 03/30/2014   Procedure: ATRIAL FLUTTER ABLATION;  Surgeon: Coralyn Mark, MD;  Location: [place] CATH LAB;  Service: Cardiovascular;  Laterality: N/A;   CARDIAC CATHETERIZATION  09/2003   Ejection fraction was estimated at 65%.   CARDIOVERSION N/A 03/15/2014   Procedure: CARDIOVERSION;  Surgeon: Lelon Perla, MD;  Location: [facility] ENDOSCOPY;  Service: Cardiovascular;  Laterality: N/A;   CARDIOVERSION N/A 07/17/2017   Procedure: CARDIOVERSION;  Surgeon: Sueanne Margarita, MD;  Location: [facility] ENDOSCOPY;  Service: Cardiovascular;  Laterality: N/A;   CORONARY ARTERY BYPASS GRAFT  09/2003   [place]ima-lad,svg-diag,svg-om/distal LCX,svg-pda   TEE WITHOUT CARDIOVERSION N/A  03/15/2014   Procedure: TRANSESOPHAGEAL ECHOCARDIOGRAM (TEE);  Surgeon: Lelon Perla, MD;  Location: [facility] ENDOSCOPY;  Service: Cardiovascular;  Laterality: N/A;   TEE WITHOUT CARDIOVERSION N/A 07/17/2017   Procedure: TRANSESOPHAGEAL ECHOCARDIOGRAM (TEE);  Surgeon: Sueanne Margarita, MD;  Location: [facility] ENDOSCOPY;  Service: Cardiovascular;  Laterality: N/A;   TONSILLECTOMY  1950's    There were no vitals filed for this visit.   Subjective Assessment - 03/22/21 1104     Subjective I am doing my exercises.  My ankles are stiff and I want exercises to address this.                               [place] Adult PT Treatment/Exercise - 03/22/21 0001       Lumbar Exercises: Stretches   Active Hamstring Stretch 2 reps;20 seconds;Left;Right    Piriformis Stretch Right;Left;2 reps;30 seconds    Piriformis Stretch Limitations seated    Other Lumbar Stretch Exercise lumbar flexion; 3 x 10 sec    Other Lumbar Stretch Exercise ball roll outs forward and lateral 5" x 5 each      Lumbar Exercises: Aerobic   Nustep Level 2x 6 minutes- PT present to discuss progress      Knee/Hip Exercises: Stretches   Press photographer Both;2 reps;20 seconds    Gastroc Stretch Limitations on step and using angled rocker  board      Knee/Hip Exercises: Standing   Rocker Board 3 minutes                    PT Education - 03/22/21 1131     Education Details Access Code: [email or web address]    Person(s) Educated Patient    Methods Explanation;Demonstration;Handout    Comprehension Verbalized understanding;Returned demonstration              PT Short Term Goals - 03/15/21 1716       PT SHORT TERM GOAL #1   Title Patient will be independent with HEP for continued progression at home.    Time 3    Period Weeks    Status Achieved    Target Date 03/23/21      PT SHORT TERM GOAL #2   Title Patient will demonstrate at least 25 degrees cervical sidebending bilaterally to more readily perform  daily tasks.    Baseline Lt 15; Rt 12    Time 3    Period Weeks    Status On-going               PT Long Term Goals - 03/02/21 1046       PT LONG TERM GOAL #1   Title Patient will be independent with advanced HEP for long term management of symptoms post D/C.    Time 6    Period Weeks    Status New    Target Date 04/13/21      PT LONG TERM GOAL #2   Title Patient will demonstrate supine passive knee extension to -30 degrees to indicate improved mobility.    Baseline bilateral lacking approx 60 degrees extension    Time 6    Period Weeks    Status New    Target Date 04/13/21      PT LONG TERM GOAL #3   Title Patient will score 15% or less on WOMAC to indicate improved activity tolerance due to decreased joint stiffness.    Baseline 27%    Time 6    Period Weeks    Status New    Target Date 04/13/21                   Plan - 03/22/21 1126     Clinical Impression Statement Pt reports bil ankle stiffness that is consistent throughout the day and asked for ankle flexibility exercises for home.  PT added gastroc stretch at step and showed pt how to use a rockerboard for flexibility. Pt required minor verbal and tactile cues for alignment and technique. Patient reports decreased stiffness at end of session. Patient would benefit from continued skilled intervention for improved functional mobility.    PT Frequency 1x / week    PT Duration 6 weeks    PT Treatment/Interventions ADLs/Self Care Home Management;Iontophoresis '4mg'$ /ml Dexamethasone;Moist Heat;Cryotherapy;Functional mobility training;Therapeutic activities;Therapeutic exercise;Balance training;Patient/family education;Manual techniques;Passive range of motion;Dry needling;Joint Manipulations    PT Next Visit Plan progress active stretching and global mobility    PT Home Exercise Plan Access Code: [email or web address]    Recommended Other Services initial cert is signed             Patient will benefit from skilled  therapeutic intervention in order to improve the following deficits and impairments:  Decreased mobility, Decreased range of motion, Hypomobility, Impaired flexibility, Increased fascial restricitons, Increased muscle spasms, Postural dysfunction, Improper body mechanics  Visit Diagnosis: Abnormal posture  Stiffness of left knee, not elsewhere  classified  Stiffness of right knee, not elsewhere classified  Cramp and spasm     Problem List Patient Active Problem List   Diagnosis Date Noted   Psoriatic arthritis ([place])    History of psoriasis    History of gout    GERD (gastroesophageal reflux disease)    Chicken pox    Persistent atrial fibrillation ([place])    Erectile dysfunction 05/06/2017   Overweight (BMI 25.0-29.9) 08/06/2016   OSA (obstructive sleep apnea) 08/06/2014   Snoring 05/22/2014   Hypertension    Atrial flutter ([place]) 03/14/2014   Psoriasis 02/27/2013   Gout 02/27/2013   S/P CABG (coronary artery bypass graft) 06/29/2011   Coronary artery disease    Hypercholesterolemia     Sigurd Sos, PT 03/22/21 11:44 AM   [facility] [address] [place], [place], [address] Phone: [phone]   Fax:  [phone]  Name: [name] [name] MRN: [id number] Date of Birth: May 08, 1951

## 2021-03-22 NOTE — Patient Instructions (Signed)
Access Code: [email or web address] URL: [email or web address] Date: 03/22/2021 Prepared by: Claiborne Billings   Seated Hamstring Stretch - 2 x daily - 7 x weekly - 1 sets - 3 reps - 20-30 hold Seated Figure 4 Piriformis Stretch - 2 x daily - 7 x weekly - 1 sets - 3 reps - 20-30 hold Standing Gastroc Stretch on Step with Counter Support - 2 x daily - 7 x weekly - 1 sets - 3 reps - 20 hold

## 2021-04-10 ENCOUNTER — Telehealth: Payer: Self-pay | Admitting: Pharmacist

## 2021-04-10 NOTE — Chronic Care Management (AMB) (Signed)
Chronic Care Management Pharmacy Assistant   Name: XAN INGRAHAM  MRN: [id number] DOB: Dec 12, 1950  [name] [name] is an 70 y.o. year old male who presents for his initial CCM visit with the clinical pharmacist.  Reason for Encounter: Chart prep for initial visit with Jeni Salles the Clinical Pharmacist on 04/12/2021.   Conditions to be addressed/monitored: Atrial Fibrillation, HTN, HLD, and Gout, GERD and ED   Recent office visits:  02/28/2021 [name], MD (PCP) - Patient was seen for a physical exam and other issues. No medication changes. No follow up noted.  17/05/2021 Randel Pigg, nurse health advisor. Patient was seen for AWV.  11/29/2020 Burchette, Alinda Sierras, MD (PCP) - Patient was seen for Closed fracture of multiple ribs of right side and additional issues.  No medication changes.  No follow up noted.  Recent consult visits:  02/27/2021 Martinique, Peter M, MD (Cardiology) - Patient was seen for Atrial fibrillation and additional issues. No medication changes. Follow up in 6 months.  02/01/2021 Clydene Laming MD [facility] Cardiology) - Patient was seen via televisit for atrial flutter. No medication changes. Follow up in 1 year.  12/13/2020 Fabian Sharp PA (Cardiology) - Patient was seen for supraventricular tachycardia.  Discontinued Oxycodone 5mg . Keep follow up appointment with Dr. [name].  Hospital visits:  11/16/2020 Patient was seen at [facility] ED for 1 hour.  Patient was seen for closed fracture of multiple ribs of right side and contusion of right lung. Medications started, Oxycodone 5mg  every 6 hrs. Prn. Tylenol 325mg  every 6 hrs prn. And Lidoderm 5% every 24 hrs. Follow up scheduled with [name], MD (Family Medicine) in 2 weeks.  10/19/2020 Patient was seen at [facility] ED. Patient was seen for supraventricular tachycardia. Medication given in the ED, adenosine (ADENOCARD) 6 MG/2ML injection 6 mg (6 mg Intravenous. Patient  was started on Metoprolol 12.5mg  twice daily. Follow up scheduled with [facility] (cardiology) for placement of monitor.  Medications: Outpatient Encounter Medications as of 04/10/2021  Medication Sig   acetaminophen (TYLENOL) 325 MG tablet Take 2 tablets (650 mg total) by mouth every 6 (six) hours as needed for up to 30 doses for mild pain or moderate pain.   atorvastatin (LIPITOR) 80 MG tablet TAKE 1 TABLET BY MOUTH EVERY DAY   cholecalciferol (VITAMIN D) 1000 UNITS tablet Take 1,000 Units by mouth daily.   Coenzyme Q10 (CO Q 10 PO) Take 100 mg by mouth daily.    colchicine 0.6 MG tablet TAKE 1 TABLET BY MOUTH TWICE A DAY (Patient taking differently: Take 0.6 mg by mouth daily as needed (gout).)   ELIQUIS 5 MG TABS tablet TAKE 1 TABLET BY MOUTH TWICE A DAY   furosemide (LASIX) 20 MG tablet Take 1 tablet (20 mg total) by mouth 2 (two) times daily.   losartan (COZAAR) 50 MG tablet TAKE 1 TABLET BY MOUTH EVERY DAY   methotrexate 2.5 MG tablet Take 2.5 mg by mouth once a week. Caution:Chemotherapy. Protect from light. Take on Tuesdays   metoprolol succinate (TOPROL-XL) 50 MG 24 hr tablet Take 1 tablet (50 mg total) by mouth daily. Take with or immediately following a meal.   metoprolol tartrate (LOPRESSOR) 25 MG tablet Take 0.5 tablets (12.5 mg total) by mouth 2 (two) times daily as needed (palpitations/heart racing).   MODERNA COVID-19 VACCINE 100 MCG/0.5ML injection    multivitamin (THERAGRAN) per tablet Take 1 tablet by mouth daily.   nitroGLYCERIN (NITROSTAT) 0.4 MG SL tablet  Place 1 tablet (0.4 mg total) under the tongue every 5 (five) minutes as needed for chest pain.   potassium chloride SA (K-DUR,KLOR-CON) 20 MEQ tablet Take 1 tablet (20 mEq total) by mouth daily.   tadalafil (CIALIS) 20 MG tablet Take one tablet by mouth every other day as needed for erectile dysfunction   No facility-administered encounter medications on file as of 04/10/2021.   Fill History:  ELIQUIS 5 MG TABLET 03/29/2021  30   ATORVASTATIN 80 MG TABLET 12/21/2020 90   COLCHICINE 0.6 MG TABLET 07/28/2020 30   FUROSEMIDE 20 MG TABLET 12/21/2020 90   LOSARTAN POTASSIUM 50 MG TAB 01/23/2021 90   METOPROLOL SUCC ER 50 MG TAB 02/16/2021 90   NITROGLYCERIN 0.4 MG TABLET SL 12/13/2020 5   Have you seen any other providers since your last visit? **no  Any changes in your medications or health? no  Any side effects from any medications? no  Do you have an symptoms or problems not managed by your medications? no  Any concerns about your health right now? no  Has your provider asked that you check blood pressure, blood sugar, or follow special diet at home?  Yes, Patient checks his blood pressures about every 2 weeks, his average blood pressure is 130/70. Patient states his diet consists of fruit and sometimes toast with peanut butter for breakfast. He eats meat twice a week, most of his diet consists of vegetables.  He does not eat fast food and he does eat out often however, makes healthy choices like salads.  Do you get any type of exercise on a regular basis? Yes, Patient does yard work and walks, he also goes to the gym 3 times per week and has a Physiological scientist that works with him.   Can you think of a goal you would like to reach for your health? No, he is currently seeing a physical therapist to help him with stretching.   Do you have any problems getting your medications? no  Is there anything that you would like to discuss during the appointment? None  Patient reminded to have medications and supplements for appointment  Care Gaps:  AWV - completed 01/30/2021 Tetanus/TDAP - overdue 07/23/2018 Covid 19 vaccine booster 5 - overdue 04/03/2021  Star Rating Drugs:  Atorvastatin 80mg  - last filled 12/21/2020 90DS at [facility] (verified date with Ebony Hail) Losartan 50mg  - last filled 01/23/2021 90DS at [facility]  Time spent:   [place] [phone]

## 2021-04-11 ENCOUNTER — Other Ambulatory Visit: Payer: Self-pay | Admitting: Cardiology

## 2021-04-12 ENCOUNTER — Ambulatory Visit: Payer: Medicare HMO | Attending: Family Medicine

## 2021-04-12 ENCOUNTER — Ambulatory Visit (INDEPENDENT_AMBULATORY_CARE_PROVIDER_SITE_OTHER): Payer: Medicare HMO | Admitting: Pharmacist

## 2021-04-12 ENCOUNTER — Other Ambulatory Visit: Payer: Self-pay

## 2021-04-12 DIAGNOSIS — R293 Abnormal posture: Secondary | ICD-10-CM | POA: Insufficient documentation

## 2021-04-12 DIAGNOSIS — M25661 Stiffness of right knee, not elsewhere classified: Secondary | ICD-10-CM | POA: Insufficient documentation

## 2021-04-12 DIAGNOSIS — R252 Cramp and spasm: Secondary | ICD-10-CM | POA: Diagnosis not present

## 2021-04-12 DIAGNOSIS — E7800 Pure hypercholesterolemia, unspecified: Secondary | ICD-10-CM

## 2021-04-12 DIAGNOSIS — M25662 Stiffness of left knee, not elsewhere classified: Secondary | ICD-10-CM | POA: Insufficient documentation

## 2021-04-12 DIAGNOSIS — I10 Essential (primary) hypertension: Secondary | ICD-10-CM

## 2021-04-12 NOTE — Therapy (Signed)
[facility] [address] [place], [place], [place], [address] Phone: [phone]   Fax:  [phone]  Physical Therapy Treatment  Patient Details  Name: [name] [name] MRN: [id number] Date of Birth: Apr 23, 1951 Referring Provider (PT): [name], MD   Encounter Date: 04/12/2021   PT End of Session - 04/12/21 1142     Visit Number 5    Date for PT Re-Evaluation 05/10/21    Progress Note Due on Visit 10    PT Start Time 1103    PT Stop Time 1140    PT Time Calculation (min) 37 min    Activity Tolerance Patient tolerated treatment well    Behavior During Therapy [facility] for tasks assessed/performed             Past Medical History:  Diagnosis Date   Chicken pox    Coronary artery disease    Severe three-vessel coronary artery disease with ejection fraction of 65%   GERD (gastroesophageal reflux disease)    History of gout    History of psoriasis    Hypercholesterolemia    Hypertension    OSA (obstructive sleep apnea)    Moderate with AHI of 16.9/hr now on CPAP at 10cm H2O   Psoriatic arthritis ([place])     Past Surgical History:  Procedure Laterality Date   ATRIAL FLUTTER ABLATION  03/30/2014   ATRIAL FLUTTER ABLATION N/A 03/30/2014   Procedure: ATRIAL FLUTTER ABLATION;  Surgeon: Coralyn Mark, MD;  Location: [place] CATH LAB;  Service: Cardiovascular;  Laterality: N/A;   CARDIAC CATHETERIZATION  09/2003   Ejection fraction was estimated at 65%.   CARDIOVERSION N/A 03/15/2014   Procedure: CARDIOVERSION;  Surgeon: Lelon Perla, MD;  Location: [name] ENDOSCOPY;  Service: Cardiovascular;  Laterality: N/A;   CARDIOVERSION N/A 07/17/2017   Procedure: CARDIOVERSION;  Surgeon: Sueanne Margarita, MD;  Location: [facility] ENDOSCOPY;  Service: Cardiovascular;  Laterality: N/A;   CORONARY ARTERY BYPASS GRAFT  09/2003   [place]-lad,svg-diag,svg-om/distal LCX,svg-pda   TEE WITHOUT CARDIOVERSION N/A 03/15/2014   Procedure: TRANSESOPHAGEAL  ECHOCARDIOGRAM (TEE);  Surgeon: Lelon Perla, MD;  Location: [facility] ENDOSCOPY;  Service: Cardiovascular;  Laterality: N/A;   TEE WITHOUT CARDIOVERSION N/A 07/17/2017   Procedure: TRANSESOPHAGEAL ECHOCARDIOGRAM (TEE);  Surgeon: Sueanne Margarita, MD;  Location: [facility] ENDOSCOPY;  Service: Cardiovascular;  Laterality: N/A;   TONSILLECTOMY  1950's    There were no vitals filed for this visit.   Subjective Assessment - 04/12/21 1105     Subjective I've been doing my exercises for the most part. [place]  My ankles feel a little bit better since stretching.    Currently in Pain? No/denies                [facility] PT Assessment - 04/12/21 0001       Assessment   Medical Diagnosis generalized stiffness    Referring Provider (PT) [name], MD      [place] residence    Living Arrangements Spouse/significant other      Prior Function   Level of Independence Independent    Vocation Retired    Leisure yardwork, reading, exercise program [employer] 3 days/week      Cognition   Overall Cognitive Status Within Functional Limits for tasks assessed      AROM   Overall AROM Comments Bil knee extension lacking 10 degrees bil.    Cervical - Right Side Bend  25    Cervical - Left Side Bend 25      Strength   Overall Strength Within functional limits for tasks performed    Overall Strength Comments bil UE/LE strength grossly 5/5                           [facility] Adult PT Treatment/Exercise - 04/12/21 0001       Exercises   Exercises Neck      Neck Exercises: Stretches   Upper Trapezius Stretch 2 reps;20 seconds      Lumbar Exercises: Stretches   Active Hamstring Stretch 2 reps;20 seconds;Left;Right    Piriformis Stretch Right;Left;2 reps;30 seconds    Piriformis Stretch Limitations seated    Other Lumbar Stretch Exercise lumbar flexion; 3 x 10 sec    Other Lumbar Stretch Exercise ball roll  outs forward and lateral 5" x 5 each      Lumbar Exercises: Aerobic   Nustep Level 2x 8 minutes- PT present to discuss progress      Knee/Hip Exercises: Standing   Rocker Board 3 minutes      Knee/Hip Exercises: Seated   Sit to [employer] 10 reps                       PT Short Term Goals - 04/12/21 1113       PT SHORT TERM GOAL #1   Title Patient will be independent with HEP for continued progression at home.    Status Achieved               PT Long Term Goals - 04/12/21 1114       PT LONG TERM GOAL #1   Title Patient will be independent with advanced HEP for long term management of symptoms post D/C.    Time 4    Period Weeks    Status New    Target Date 05/10/21      PT LONG TERM GOAL #2   Title Patient will demonstrate supine passive knee extension to -30 degrees to indicate improved mobility.    Baseline lacking 10 degrees bil.    Status Achieved      PT LONG TERM GOAL #3   Title Patient will score 15% or less on WOMAC to indicate improved activity tolerance due to decreased joint stiffness.    Time 4    Period Weeks    Status On-going    Target Date 05/10/21                   Plan - 04/12/21 1134     Clinical Impression Statement Pt with lapse in treatment and has been doing his flexibility exercises.  Pt reports that he feels looser overall.  Pt required minor verbal and tactile cues for alignment and technique.  Pt with significant improvement in bil knee extension as he was lacking 60 degrees at evaluation and now only 10 degrees.   Cervical sidebending is improved by 10 degrees bilaterally.   Patient reports decreased stiffness at end of session.  Patient would benefit from continued skilled intervention for improved functional mobility.    PT Treatment/Interventions ADLs/Self Care Home Management;Iontophoresis 4mg /ml Dexamethasone;Moist Heat;Cryotherapy;Functional mobility training;Therapeutic activities;Therapeutic exercise;Balance  training;Patient/family education;Manual techniques;Passive range of motion;Dry needling;Joint Manipulations    PT Next Visit Plan 1 more session probable.    PT Home Exercise Plan Access Code: [email or web address]    [id number] and Agree with Plan of  Care Patient             Patient will benefit from skilled therapeutic intervention in order to improve the following deficits and impairments:  Decreased mobility, Decreased range of motion, Hypomobility, Impaired flexibility, Increased fascial restricitons, Increased muscle spasms, Postural dysfunction, Improper body mechanics  Visit Diagnosis: Abnormal posture - Plan: PT plan of care cert/re-cert  Stiffness of left knee, not elsewhere classified - Plan: PT plan of care cert/re-cert  Stiffness of right knee, not elsewhere classified - Plan: PT plan of care cert/re-cert  Cramp and spasm - Plan: PT plan of care cert/re-cert     Problem List Patient Active Problem List   Diagnosis Date Noted   Psoriatic arthritis ([place])    History of psoriasis    History of gout    GERD (gastroesophageal reflux disease)    Chicken pox    Persistent atrial fibrillation ([facility])    Erectile dysfunction 05/06/2017   Overweight (BMI 25.0-29.9) 08/06/2016   OSA (obstructive sleep apnea) 08/06/2014   Snoring 05/22/2014   Hypertension    Atrial flutter ([place]) 03/14/2014   Psoriasis 02/27/2013   Gout 02/27/2013   S/P CABG (coronary artery bypass graft) 06/29/2011   Coronary artery disease    Hypercholesterolemia    Sigurd Sos, PT 04/12/21 11:45 AM   [facility] [address], [place], [place], [address] Phone: [phone]   Fax:  [phone]  Name: [name] [name] MRN: [id number] Date of Birth: Sep 25, 1950

## 2021-04-12 NOTE — Progress Notes (Signed)
Chronic Care Management Pharmacy Note  04/23/2021 Name:  [name] [name] MRN:  [id number] DOB:  1950-12-02  Summary: BP at goal < 130/80 per office readings LDL at goal < 70  Recommendations/Changes made from today's visit: -Recommend uric acid level as this has never been checked -Patient requested repeat testosterone level -Recommend for next refill of potassium for 2 of the 10 mEq tablets to ease administration  Plan: Follow up BP assessment in 2-3 months   Subjective: [name] [name] is an 69 y.o. year old male who is a primary patient of Burchette, Alinda Sierras, MD.  The CCM team was consulted for assistance with disease management and care coordination needs.    Engaged with patient by telephone for initial visit in response to provider referral for pharmacy case management and/or care coordination services.   Consent to Services:  The patient was given the following information about Chronic Care Management services today, agreed to services, and gave verbal consent: 1. CCM service includes personalized support from designated clinical staff supervised by the primary care provider, including individualized plan of care and coordination with other care providers 2. 24/7 contact phone numbers for assistance for urgent and routine care needs. 3. Service will only be billed when office clinical staff spend 20 minutes or more in a month to coordinate care. 4. Only one practitioner may furnish and bill the service in a calendar month. 5.The patient may stop CCM services at any time (effective at the end of the month) by phone call to the office staff. 6. The patient will be responsible for cost sharing (co-pay) of up to 20% of the service fee (after annual deductible is met). Patient agreed to services and consent obtained.  Patient Care Team: [name], MD as PCP - General (Family Medicine) Martinique, Peter M, MD as PCP - Cardiology (Cardiology) Viona Gilmore, [facility] as Pharmacist  (Pharmacist)  Recent office visits: 02/28/2021 [name], MD (PCP) - Patient was seen for a physical exam and other issues. No medication changes. No follow up noted.   01/30/2021 Randel Pigg, LPN: Patient was seen for AWV.   11/29/2020 [name], MD (PCP) - Patient was seen for Closed fracture of multiple ribs of right side and additional issues.  No medication changes.  No follow up noted.  Recent consult visits: 02/27/2021 Martinique, Peter M, MD (Cardiology) - Patient was seen for Atrial fibrillation and additional issues. No medication changes. Follow up in 6 months.   02/01/2021 Clydene Laming MD [facility]) - Patient was seen via televisit for atrial flutter. No medication changes. Follow up in 1 year.   12/13/2020 Fabian Sharp PA (Cardiology) - Patient was seen for supraventricular tachycardia.  Discontinued Oxycodone 48m. Keep follow up appointment with Dr. Peter JMartinique  Hospital visits: [facility]  Patient was seen for closed fracture of multiple ribs of right side and contusion of right lung. Medications started, Oxycodone 580mevery 6 hrs. Prn. Tylenol 32573mvery 6 hrs prn. And Lidoderm 5% every 24 hrs. Follow up scheduled with BurEulas PostD (Family Medicine) in 2 weeks.   [facility] Patient was seen for supraventricular tachycardia. Medication given in the ED, adenosine (ADENOCARD) 6 MG/2ML injection 6 mg (6 mg Intravenous. Patient was started on Metoprolol 12.5mg102mice daily. [facility].   Objective:  Lab Results  Component Value Date   CREATININE 0.84 02/28/2021   BUN 13 02/28/2021   GFR 87.11 02/28/2021   GFRNONAA >60 10/19/2020   GFRAA 90 08/27/2019   NA 139 02/28/2021   K 4.3 02/28/2021   CALCIUM 9.9 02/28/2021   CO2 30 02/28/2021   GLUCOSE 91 02/28/2021    Lab Results  Component  Value Date/Time   GFR 87.11 02/28/2021 09:24 AM   GFR 91.74 11/23/2015 08:37 AM    Last diabetic Eye exam: No results found for: HMDIABEYEEXA  Last diabetic Foot exam: No results found for: HMDIABFOOTEX   Lab Results  Component Value Date   CHOL 136 02/28/2021   HDL 65.60 02/28/2021   LDLCALC 59 02/28/2021   TRIG 54.0 02/28/2021   CHOLHDL 2 02/28/2021    Hepatic Function Latest Ref Rng & Units 02/28/2021 11/30/2019 08/27/2019  Total Protein 6.0 - 8.3 g/dL 6.8 6.4 6.9  Albumin 3.5 - 5.2 g/dL 4.5 4.6 4.7  AST 0 - 37 U/L '20 25 22  ' ALT 0 - 53 U/L '15 22 21  ' Alk Phosphatase 39 - 117 U/L 84 86 80  Total Bilirubin 0.2 - 1.2 mg/dL 1.2 1.2 0.9  Bilirubin, Direct 0.0 - 0.3 mg/dL 0.2 0.34 0.22    Lab Results  Component Value Date/Time   TSH 0.78 02/28/2021 09:24 AM   TSH 0.82 02/26/2020 09:16 AM    CBC Latest Ref Rng & Units 02/28/2021 10/19/2020 02/26/2020  WBC 4.0 - 10.5 K/uL 6.6 11.0(H) 4.5  Hemoglobin 13.0 - 17.0 g/dL 15.3 16.1 14.8  Hematocrit 39.0 - 52.0 % 44.6 47.0 42.5  Platelets 150.0 - 400.0 K/uL 166.0 229 151    No results found for: VD25OH  Clinical ASCVD: Yes  The 10-year ASCVD risk score (Arnett DK, et al., 2019) is: 16.5%   Values used to calculate the score:     Age: 20 years     Sex: Male     Is Non-Hispanic African American: No     Diabetic: No     Tobacco smoker: No     Systolic Blood Pressure: 976 mmHg     Is BP treated: Yes     HDL Cholesterol: 65.6 mg/dL     Total Cholesterol: 136 mg/dL    Depression screen [facility] 2/9 01/30/2021 01/30/2021 07/21/2019  Decreased Interest 0 0 0  Down, Depressed, Hopeless 0 0 0  PHQ - 2 Score 0 0 0     CHA2DS2/VAS Stroke Risk Points  Current as of 8 minutes ago     3 >= 2 Points: High Risk  1 - 1.99 Points: Medium Risk  0 Points: Low Risk    No Change      Details    This score determines the patient's risk of having a stroke if the  patient has atrial fibrillation.       Points Metrics  0 Has Congestive Heart Failure:   No    Current as of 8 minutes ago  1 Has Vascular Disease:  Yes    Current as of 8 minutes ago  1 Has Hypertension:  Yes    Current as of 8 minutes ago  1 Age:  40    Current as of 8 minutes ago  0 Has Diabetes:  No    Current as of 8 minutes ago  0 Had Stroke:  No  Had TIA:  No  Had Thromboembolism:  No    Current as of 8 minutes ago  0 Male:  No  Current as of 8 minutes ago        Social History   Tobacco Use  Smoking Status Former   Packs/day: 0.50   Years: 5.00   Pack years: 2.50   Types: Cigarettes  Smokeless Tobacco Never  Tobacco Comments   "quit smoking cigarettes in the late 1970's"   BP Readings from Last 3 Encounters:  02/28/21 120/62  02/27/21 122/66  01/30/21 124/64   Pulse Readings from Last 3 Encounters:  02/28/21 (!) 116  02/27/21 89  01/30/21 67   Wt Readings from Last 3 Encounters:  02/28/21 199 lb 6.4 oz (90.4 kg)  02/27/21 200 lb 3.2 oz (90.8 kg)  01/30/21 205 lb (93 kg)   BMI Readings from Last 3 Encounters:  02/28/21 26.31 kg/m  02/27/21 26.41 kg/m  01/30/21 27.05 kg/m    Assessment/Interventions: Review of patient past medical history, allergies, medications, health status, including review of consultants reports, laboratory and other test data, was performed as part of comprehensive evaluation and provision of chronic care management services.   SDOH:  (Social Determinants of Health) assessments and interventions performed: Yes SDOH Interventions    Flowsheet Row Most Recent Value  SDOH Interventions   Financial Strain Interventions Intervention Not Indicated  Transportation Interventions Intervention Not Indicated      SDOH Screenings   Alcohol Screen: Low Risk    Last Alcohol Screening Score (AUDIT): 2  Depression (PHQ2-9): Low Risk    PHQ-2 Score: 0  Financial Resource Strain: Low Risk    Difficulty of Paying Living Expenses: Not hard at all  Food Insecurity: No Food Insecurity   Worried About Sales executive in the Last Year: Never true   Ran Out of Food in the Last Year: Never true  Housing: Low Risk    Last Housing Risk Score: 0  Physical Activity: Sufficiently Active   Days of Exercise per Week: 5 days   Minutes of Exercise per Session: 60 min  Social Connections: Engineer, building services of Communication with Friends and Family: Twice a week   Frequency of Social Gatherings with Friends and Family: Twice a week   Attends Religious Services: More than 4 times per year   Active Member of [employer] or Organizations: Yes   Attends Music therapist: More than 4 times per year   Marital Status: Married  Stress: No Stress Concern Present   Feeling of Stress : Not at all  Tobacco Use: Medium Risk   Smoking Tobacco Use: Former   Smokeless Tobacco Use: Never  Transportation Needs: No Data processing manager (Medical): No   Lack of Transportation (Non-Medical): No   Patient lives with his wife and goes to the gym 3 days a week and is part of the program called [employer]. They usually go to a group class from 7-7:30 am and they go three days a week. During the school year he meets with a trainer 3 days a week after the class as well. This includes weight training, cardio, and other strengthening. He also walks 2-3 days a week and does some mowing.  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   Patient goes to sleep around 10 pm and usually gets up around 6 am. He denies any problems with sleep and doesn't feel tired during the day  and seldom takes nap.  Patient usually eats at home and tries to eat a lot of vegetables and fruit. He only eats meat twice a week and it's usually chicken and fish. He eats a lot of salads and stir fried vegetables.  Patient doesn't think he is having any side effects from his medications.  He did develop a cough with a blood pressure medication in the past and it was switched to losartan.   CCM Care Plan  No Known Allergies  Medications Reviewed Today     Reviewed by Viona Gilmore, [facility] (Pharmacist) on 04/12/21 at 1542  Med List Status: <None>   Medication Order Taking? Sig Documenting Provider Last Dose Status Informant  acetaminophen (TYLENOL) 325 MG tablet [id number] Yes Take 2 tablets (650 mg total) by mouth every 6 (six) hours as needed for up to 30 doses for mild pain or moderate pain. Wyvonnia Dusky, MD Taking Active   atorvastatin (LIPITOR) 80 MG tablet [id number] Yes TAKE 1 TABLET BY MOUTH EVERY DAY Martinique, Peter M, MD Taking Active   cholecalciferol (VITAMIN D) 1000 UNITS tablet [id number] Yes Take 1,000 Units by mouth daily. [provider] Taking Active Self  Coenzyme Q10 (CO Q 10 PO) [id number] Yes Take 100 mg by mouth daily.  [provider] Taking Active Self  colchicine 0.6 MG tablet [id number] Yes TAKE 1 TABLET BY MOUTH TWICE A DAY  Patient taking differently: Take 0.6 mg by mouth daily as needed (gout).   [name], MD Taking Active   ELIQUIS 5 MG TABS tablet [id number] Yes TAKE 1 TABLET BY MOUTH TWICE A DAY Martinique, Peter M, MD Taking Active Self  furosemide (LASIX) 20 MG tablet [id number] Yes Take 1 tablet (20 mg total) by mouth 2 (two) times daily. Martinique, Peter M, MD Taking Active Self  losartan (COZAAR) 50 MG tablet [id number] Yes TAKE 1 TABLET BY MOUTH EVERY DAY Martinique, Peter M, MD Taking Active   methotrexate 2.5 MG tablet [id number] Yes Take 2.5 mg by mouth once a week. Caution:Chemotherapy. Protect from light. Take on Tuesdays [provider] Taking Active Self  metoprolol succinate (TOPROL-XL) 50 MG 24 hr tablet [id number] Yes Take 1 tablet (50 mg total) by mouth daily. Take with or immediately following a meal. Martinique, Peter M, MD Taking Active   metoprolol tartrate (LOPRESSOR) 25 MG tablet [id number] Yes Take 0.5 tablets  (12.5 mg total) by mouth 2 (two) times daily as needed (palpitations/heart racing). Gareth Morgan, MD Taking Active   multivitamin [facility]) per tablet [id number] Yes Take 1 tablet by mouth daily. [provider] Taking Active Self  nitroGLYCERIN (NITROSTAT) 0.4 MG SL tablet [id number] Yes Place 1 tablet (0.4 mg total) under the tongue every 5 (five) minutes as needed for chest pain. Ledora Bottcher, PA Taking Active   potassium chloride SA (K-DUR,KLOR-CON) 20 MEQ tablet [id number] Yes Take 1 tablet (20 mEq total) by mouth daily. Martinique, Peter M, MD Taking Active Self  tadalafil (CIALIS) 20 MG tablet [id number] Yes Take one tablet by mouth every other day as needed for erectile dysfunction [name], MD Taking Active             Patient Active Problem List   Diagnosis Date Noted   Psoriatic arthritis [facility])    History of psoriasis    History of gout    GERD (gastroesophageal reflux disease)    Chicken pox    Persistent atrial fibrillation ([place])    Erectile dysfunction 05/06/2017  Overweight (BMI 25.0-29.9) 08/06/2016   OSA (obstructive sleep apnea) 08/06/2014   Snoring 05/22/2014   Hypertension    Atrial flutter ([place]) 03/14/2014   Psoriasis 02/27/2013   Gout 02/27/2013   S/P CABG (coronary artery bypass graft) 06/29/2011   Coronary artery disease    Hypercholesterolemia     Immunization History  Administered Date(s) Administered   Fluad Quad(high Dose 65+) 06/09/2019   Influenza, High Dose Seasonal PF 05/06/2017, 06/10/2018   Influenza,inj,Quad PF,6+ Mos 08/20/2013, 03/31/2014   Influenza-Unspecified 05/14/2017   Moderna SARS-COV2 Booster Vaccination 05/23/2020, 12/01/2020, 04/02/2021   Moderna Sars-Covid-2 Vaccination 08/17/2019, 09/17/2019   Pneumococcal Conjugate-13 08/20/2013   Pneumococcal Polysaccharide-23 02/26/2020   Td 07/23/2008    Conditions to be addressed/monitored:  Hypertension, Hyperlipidemia, Atrial Fibrillation, Coronary Artery  Disease, GERD, Gout, and ED  Care Plan : [place]  Updates made by Viona Gilmore, [place] since 04/23/2021 12:00 AM     Problem: Problem: Hypertension, Hyperlipidemia, Atrial Fibrillation, Coronary Artery Disease, GERD, Gout, and ED      Long-Range Goal: Patient-Specific Goal   Start Date: 04/12/2021  Expected End Date: 04/12/2022  This Visit's Progress: On track  Priority: High  Note:   Current Barriers:  Unable to independently monitor therapeutic efficacy Suboptimal therapeutic regimen for gout  Pharmacist Clinical Goal(s):  Patient will achieve adherence to monitoring guidelines and medication adherence to achieve therapeutic efficacy through collaboration with PharmD and provider.   Interventions: 1:1 collaboration with [name], MD regarding development and update of comprehensive plan of care as evidenced by provider attestation and co-signature Inter-disciplinary care team collaboration (see longitudinal plan of care) Comprehensive medication review performed; medication list updated in electronic medical record  Hypertension (BP goal <130/80) -Controlled -Current treatment: Metoprolol succinate 50 mg 1 tablet daily - in PM Losartan 50 mg 1 tablet daily - in AM -Medications previously tried: unknown (cough)  -Current home readings: 130/70-80 (arm cuff) -Current dietary habits: tries to limit salt intake; only adds salt to popcorn or corn on the cob; do eat some canned and frozen vegetables -Current exercise habits: very active at the gym and walking several days a week -Denies hypotensive/hypertensive symptoms -Educated on BP goals and benefits of medications for prevention of heart attack, stroke and kidney damage; Daily salt intake goal < 2300 mg; Importance of home blood pressure monitoring; Proper BP monitoring technique; -Counseled to monitor BP at home weekly, document, and provide log at future appointments -Counseled on diet and exercise  extensively Recommended to continue current medication  Hyperlipidemia: (LDL goal < 70) -Controlled -Current treatment: Atorvastatin 80 mg 1 tablet daily -Medications previously tried: none  -Current dietary patterns: mostly lean meat (chicken and fish); lots of vegetables and fruit -Current exercise habits: very active at the gym and walking -Educated on Cholesterol goals;  Benefits of statin for ASCVD risk reduction; Exercise goal of 150 minutes per week; -Counseled on diet and exercise extensively Recommended to continue current medication  CAD (Goal: prevent heart events) -Controlled -Current treatment  Atorvastatin 80 mg 1 tablet daily Nitroglycerin 0.4 mg 1 tablet as needed -Medications previously tried: none  -Recommended to continue current medication Counseled on importance of checking the expiration date of nitroglycerin and getting and keeping supply up to date.   Atrial Fibrillation (Goal: prevent stroke and major bleeding) -Controlled -CHADSVASC: 3 -Current treatment: Rate control: Metoprolol succinate 50 mg 1 tablet daily; metoprolol tartrate 25 mg as needed  Anticoagulation: Eliquis 5 mg 1 tablet twice daily -Medications previously tried:  n/a -Home BP and HR readings: 40-50s  -Counseled on importance of adherence to anticoagulant exactly as prescribed; bleeding risk associated with Eliquis and importance of self-monitoring for signs/symptoms of bleeding; avoidance of NSAIDs due to increased bleeding risk with anticoagulants; -Counseled on diet and exercise extensively Recommended to continue current medication  Gout (Goal: uric acid < 6 and prevent flare ups) -Controlled -Current treatment  Colchicine 0.6 mg 1 tablet as needed -Medications previously tried: none  -Counseled on foods and beverages that can increase risk for gout flare ups such as alcohol, sweet beverages, red meat, organ meat and some seafood.  Swelling (Goal: minimize fluid  retention) -Controlled -Current treatment  Furosemide 20 mg 1 tablet twice daily Potassium 20 mEq 1 tablet daily -Medications previously tried: none  -Counseled on importance of daily weights with water pill and calling if gaining > 3 lbs in one day or > 5 lbs in one week.   Health Maintenance -Vaccine gaps: tetanus, influenza -Current therapy:  Acetaminophen 325 mg 2 tablets every 6 hours as needed Vitamin D 1000 units daily Coenzyme Q10 100 mg daily Multivitamin 1 tablet daily Tadalafil 20 mg 1 tablet as needed -Educated on Cost vs benefit of each product must be carefully weighed by individual consumer -Patient is satisfied with current therapy and denies issues -Educated on contraindication with use of Cialis and nitroglyerin within the same 3 days.  Patient Goals/Self-Care Activities Patient will:  - take medications as prescribed check blood pressure weekly, document, and provide at future appointments weigh daily, and contact provider if weight gain of > 3 lbs in one day or > 5 lbs in one week target a minimum of 150 minutes of moderate intensity exercise weekly  Follow Up Plan: Telephone follow up appointment with care management team member scheduled for: 6 months       Medication Assistance: None required.  Patient affirms current coverage meets needs.   Compliance/Adherence/Medication fill history: Care Gaps: Tetanus, influenza, shingrix  Star-Rating Drugs: Atorvastatin 64m - last filled 12/21/2020 [facility] (verified date with AEbony Hail Losartan 559m- last filled [facility]/23/2021 [facility] at [facility]  Patient's preferred pharmacy is:  [id number] [place] [address] [id number]: [phone]: [phone] pill box? No - sitting out where he can see them; morning and evening pills; uses a pillbox for trips Pt endorses 99% compliance  We discussed: Benefits  of medication synchronization, packaging and delivery as well as enhanced pharmacist oversight with [employer]. Patient decided to: Continue current medication management strategy  Care Plan and Follow Up Patient Decision:  Patient agrees to Care Plan and Follow-up.  Plan: Telephone follow up appointment with care management team member scheduled for:  6 months  MadJeni SallesharmD, BCABaconarmacist [phone]

## 2021-04-13 ENCOUNTER — Other Ambulatory Visit: Payer: Self-pay | Admitting: Cardiology

## 2021-04-13 NOTE — Telephone Encounter (Signed)
Prescription refill request for Eliquis received. Indication:afib Last office visit:[place] 02/27/21 Scr:0.84 02/28/21 Age: 39m Weight:90.4kg

## 2021-04-21 DIAGNOSIS — E7800 Pure hypercholesterolemia, unspecified: Secondary | ICD-10-CM

## 2021-04-21 DIAGNOSIS — I10 Essential (primary) hypertension: Secondary | ICD-10-CM | POA: Diagnosis not present

## 2021-04-23 NOTE — Patient Instructions (Addendum)
Hi [name] [name],  It was great to get to meet you over the telephone! Below is a summary of some of the topics we discussed.   Let me know if you want to give [facility] a try!  Please reach out to me if you have any questions or need anything before our follow up!  Best, [name] [name]  [name] [name], PharmD, [facility]   Visit Information   Goals Addressed   None    Patient Care Plan: CCM Pharmacy Care Plan     Problem Identified: Problem: Hypertension, Hyperlipidemia, Atrial Fibrillation, Coronary Artery Disease, GERD, Gout, and ED      Long-Range Goal: Patient-Specific Goal   Start Date: 04/12/2021  Expected End Date: 04/12/2022  This Visit's Progress: On track  Priority: High  Note:   Current Barriers:  Unable to independently monitor therapeutic efficacy Suboptimal therapeutic regimen for gout  Pharmacist Clinical Goal(s):  Patient will achieve adherence to monitoring guidelines and medication adherence to achieve therapeutic efficacy through collaboration with PharmD and provider.   Interventions: 1:1 collaboration with [name] Post, MD regarding development and update of comprehensive plan of care as evidenced by provider attestation and co-signature Inter-disciplinary care team collaboration (see longitudinal plan of care) Comprehensive medication review performed; medication list updated in electronic medical record  Hypertension (BP goal <130/80) -Controlled -Current treatment: Metoprolol succinate 50 mg 1 tablet daily - in PM Losartan 50 mg 1 tablet daily - in AM -Medications previously tried: unknown (cough)  -Current home readings: 130/70-80 (arm cuff) -Current dietary habits: tries to limit salt intake; only adds salt to popcorn or corn on the cob; do eat some canned and frozen vegetables -Current exercise habits: very active at the gym and walking several days a week -Denies  hypotensive/hypertensive symptoms -Educated on BP goals and benefits of medications for prevention of heart attack, stroke and kidney damage; Daily salt intake goal < 2300 mg; Importance of home blood pressure monitoring; Proper BP monitoring technique; -Counseled to monitor BP at home weekly, document, and provide log at future appointments -Counseled on diet and exercise extensively Recommended to continue current medication  Hyperlipidemia: (LDL goal < 70) -Controlled -Current treatment: Atorvastatin 80 mg 1 tablet daily -Medications previously tried: none  -Current dietary patterns: mostly lean meat (chicken and fish); lots of vegetables and fruit -Current exercise habits: very active at the gym and walking -Educated on Cholesterol goals;  Benefits of statin for ASCVD risk reduction; Exercise goal of 150 minutes per week; -Counseled on diet and exercise extensively Recommended to continue current medication  CAD (Goal: prevent heart events) -Controlled -Current treatment  Atorvastatin 80 mg 1 tablet daily Nitroglycerin 0.4 mg 1 tablet as needed -Medications previously tried: none  -Recommended to continue current medication Counseled on importance of checking the expiration date of nitroglycerin and getting and keeping supply up to date.   Atrial Fibrillation (Goal: prevent stroke and major bleeding) -Controlled -CHADSVASC: 3 -Current treatment: Rate control: Metoprolol succinate 50 mg 1 tablet daily; metoprolol tartrate 25 mg as needed  Anticoagulation: Eliquis 5 mg 1 tablet twice daily -Medications previously tried: n/a -Home BP and HR readings: 40-50s  -Counseled on importance of adherence to anticoagulant exactly as prescribed; bleeding risk associated with Eliquis and importance of self-monitoring for signs/symptoms of bleeding; avoidance of NSAIDs due to increased bleeding risk with anticoagulants; -Counseled on diet and exercise extensively Recommended to  continue current medication  Gout (Goal: uric acid < 6 and prevent flare ups) -  Controlled -Current treatment  Colchicine 0.6 mg 1 tablet as needed -Medications previously tried: none  -Counseled on foods and beverages that can increase risk for gout flare ups such as alcohol, sweet beverages, red meat, organ meat and some seafood.  Swelling (Goal: minimize fluid retention) -Controlled -Current treatment  Furosemide 20 mg 1 tablet twice daily Potassium 20 mEq 1 tablet daily -Medications previously tried: none  -Counseled on importance of daily weights with water pill and calling if gaining > 3 lbs in one day or > 5 lbs in one week.   Health Maintenance -Vaccine gaps: tetanus, influenza -Current therapy:  Acetaminophen 325 mg 2 tablets every 6 hours as needed Vitamin D 1000 units daily Coenzyme Q10 100 mg daily Multivitamin 1 tablet daily Tadalafil 20 mg 1 tablet as needed -Educated on Cost vs benefit of each product must be carefully weighed by individual consumer -Patient is satisfied with current therapy and denies issues -Educated on contraindication with use of Cialis and nitroglyerin within the same 3 days.  Patient Goals/Self-Care Activities Patient will:  - take medications as prescribed check blood pressure weekly, document, and provide at future appointments weigh daily, and contact provider if weight gain of > 3 lbs in one day or > 5 lbs in one week target a minimum of 150 minutes of moderate intensity exercise weekly  Follow Up Plan: Telephone follow up appointment with care management team member scheduled for: 6 months      [name] [name] was given information about Chronic Care Management services today including:  CCM service includes personalized support from designated clinical staff supervised by his physician, including individualized plan of care and coordination with other care providers 24/7 contact phone numbers for assistance for urgent and routine care  needs. Standard insurance, coinsurance, copays and deductibles apply for chronic care management only during months in which we provide at least 20 minutes of these services. Most insurances cover these services at 100%, however patients may be responsible for any copay, coinsurance and/or deductible if applicable. This service may help you avoid the need for more expensive face-to-face services. Only one practitioner may furnish and bill the service in a calendar month. The patient may stop CCM services at any time (effective at the end of the month) by phone call to the office staff.  Patient agreed to services and verbal consent obtained.   Patient verbalizes understanding of instructions provided today and agrees to view in [place].  Telephone follow up appointment with pharmacy team member scheduled for: 6 months  Viona Gilmore, [facility]

## 2021-04-27 ENCOUNTER — Other Ambulatory Visit: Payer: Self-pay

## 2021-04-27 ENCOUNTER — Ambulatory Visit: Payer: Medicare HMO | Attending: Family Medicine

## 2021-04-27 DIAGNOSIS — R252 Cramp and spasm: Secondary | ICD-10-CM | POA: Insufficient documentation

## 2021-04-27 DIAGNOSIS — M25662 Stiffness of left knee, not elsewhere classified: Secondary | ICD-10-CM | POA: Diagnosis not present

## 2021-04-27 DIAGNOSIS — M25661 Stiffness of right knee, not elsewhere classified: Secondary | ICD-10-CM | POA: Insufficient documentation

## 2021-04-27 DIAGNOSIS — R293 Abnormal posture: Secondary | ICD-10-CM | POA: Insufficient documentation

## 2021-04-27 NOTE — Therapy (Signed)
[facility] @ [place], [place], [address] Phone:     Fax:     Physical Therapy Treatment  Patient Details  Name: [name] [name] MRN: [id number] Date of Birth: September 11, 1950 Referring Provider (PT): [name], MD   Encounter Date: 04/27/2021   PT End of Session - 04/27/21 0839     Visit Number 6    Authorization Type [employer] Medicare    PT Start Time 0800    PT Stop Time 2902    PT Time Calculation (min) 34 min    Activity Tolerance Patient tolerated treatment well    Behavior During Therapy [facility] for tasks assessed/performed             Past Medical History:  Diagnosis Date   Chicken pox    Coronary artery disease    Severe three-vessel coronary artery disease with ejection fraction of 65%   GERD (gastroesophageal reflux disease)    History of gout    History of psoriasis    Hypercholesterolemia    Hypertension    OSA (obstructive sleep apnea)    Moderate with AHI of 16.9/hr now on CPAP at 10cm H2O   Psoriatic arthritis ([place])     Past Surgical History:  Procedure Laterality Date   ATRIAL FLUTTER ABLATION  03/30/2014   ATRIAL FLUTTER ABLATION N/A 03/30/2014   Procedure: ATRIAL FLUTTER ABLATION;  Surgeon: Coralyn Mark, MD;  Location: [facility] CATH LAB;  Service: Cardiovascular;  Laterality: N/A;   CARDIAC CATHETERIZATION  09/2003   Ejection fraction was estimated at 65%.   CARDIOVERSION N/A 03/15/2014   Procedure: CARDIOVERSION;  Surgeon: Lelon Perla, MD;  Location: [facility] ENDOSCOPY;  Service: Cardiovascular;  Laterality: N/A;   CARDIOVERSION N/A 07/17/2017   Procedure: CARDIOVERSION;  Surgeon: Sueanne Margarita, MD;  Location: [facility] ENDOSCOPY;  Service: Cardiovascular;  Laterality: N/A;   CORONARY ARTERY BYPASS GRAFT  09/2003   [place]-lad,svg-diag,svg-om/distal LCX,svg-pda   TEE WITHOUT CARDIOVERSION N/A 03/15/2014   Procedure: TRANSESOPHAGEAL ECHOCARDIOGRAM (TEE);  Surgeon: Lelon Perla, MD;  Location:  [facility] ENDOSCOPY;  Service: Cardiovascular;  Laterality: N/A;   TEE WITHOUT CARDIOVERSION N/A 07/17/2017   Procedure: TRANSESOPHAGEAL ECHOCARDIOGRAM (TEE);  Surgeon: Sueanne Margarita, MD;  Location: [facility] ENDOSCOPY;  Service: Cardiovascular;  Laterality: N/A;   TONSILLECTOMY  1950's    There were no vitals filed for this visit.   Subjective Assessment - 04/27/21 0802     Subjective My trip was good.    Currently in Pain? No/denies                [facility] PT Assessment - 04/27/21 0001       Assessment   Medical Diagnosis generalized stiffness    Referring Provider (PT) [name], MD      [place] residence    Living Arrangements Spouse/significant other      Prior Function   Level of Independence Independent    Vocation Retired    Leisure yardwork, reading, exercise program [employer] 3 days/week      Cognition   Overall Cognitive Status Within Functional Limits for tasks assessed      Observation/Other Assessments   Other Surveys  [employer] [place] and [employer] Osteoarthritis Index [facility])    The [place] and [employer] Osteoarthritis Index [facility])  15%      AROM   Cervical - Right Side Bend 25    Cervical - Left Side  Bend 20      Strength   Overall Strength Within functional limits for tasks performed    Overall Strength Comments bil UE/LE strength grossly 5/5                           [facility] Adult PT Treatment/Exercise - 04/27/21 0001       Neck Exercises: Stretches   Upper Trapezius Stretch 2 reps;20 seconds      Lumbar Exercises: Stretches   Active Hamstring Stretch 2 reps;20 seconds;Left;Right    Piriformis Stretch Right;Left;2 reps;30 seconds    Piriformis Stretch Limitations seated and supine      Lumbar Exercises: Aerobic   Nustep Level 2x 8 minutes- PT present to discuss progress      Lumbar Exercises: Sidelying   Other Sidelying Lumbar Exercises open book x 10 bil                        PT Short Term Goals - 04/12/21 1113       PT SHORT TERM GOAL #1   Title Patient will be independent with HEP for continued progression at home.    Status Achieved               PT Long Term Goals - 04/27/21 0804       PT LONG TERM GOAL #1   Title Patient will be independent with advanced HEP for long term management of symptoms post D/C.    Status Achieved      PT LONG TERM GOAL #2   Title Patient will demonstrate supine passive knee extension to -30 degrees to indicate improved mobility.    Status Achieved      PT LONG TERM GOAL #3   Title Patient will score 15% or less on WOMAC to indicate improved activity tolerance due to decreased joint stiffness.    Baseline 15%    Status Achieved                   Plan - 04/27/21 0825     Clinical Impression Statement Pt reports that he feels looser overall.  Pt required minor verbal and tactile cues for alignment and technique.  Pt with significant improvement in bil knee extension as he was lacking 60 degrees at evaluation and now only 10 degrees.   Cervical sidebending is improved by 10 degrees bilaterally.  WOMAC score is improved from 27% to 15%.  Pt will D/C to HEP today.    PT Next Visit Plan D/C PT to HEP    PT Home Exercise Plan Access Code: [email or web address]    Consulted and Agree with Plan of Care Patient             Patient will benefit from skilled therapeutic intervention in order to improve the following deficits and impairments:     Visit Diagnosis: Abnormal posture  Stiffness of left knee, not elsewhere classified  Stiffness of right knee, not elsewhere classified  Cramp and spasm     Problem List Patient Active Problem List   Diagnosis Date Noted   Psoriatic arthritis ([place])    History of psoriasis    History of gout    GERD (gastroesophageal reflux disease)    Chicken pox    Persistent atrial fibrillation ([place])    Erectile dysfunction 05/06/2017    Overweight (BMI 25.0-29.9) 08/06/2016   OSA (obstructive sleep apnea) 08/06/2014   Snoring 05/22/2014  Hypertension    Atrial flutter ([place]) 03/14/2014   Psoriasis 02/27/2013   Gout 02/27/2013   S/P CABG (coronary artery bypass graft) 06/29/2011   Coronary artery disease    Hypercholesterolemia    PHYSICAL THERAPY DISCHARGE SUMMARY  Visits from Start of Care: 6  Current functional level related to goals / functional outcomes: See above for current status.     Remaining deficits: No significant deficits remain.     Education / Equipment: HEP   Patient agrees to discharge. Patient goals were met. Patient is being discharged due to meeting the stated rehab goals.  Sigurd Sos, PT 04/27/21 8:40 AM   [facility] @ [place], [place], [address] Phone:     Fax:     Name: [name] [name] MRN: [id number] Date of Birth: 07-23-1951

## 2021-05-10 DIAGNOSIS — I4891 Unspecified atrial fibrillation: Secondary | ICD-10-CM | POA: Diagnosis not present

## 2021-05-10 DIAGNOSIS — E785 Hyperlipidemia, unspecified: Secondary | ICD-10-CM | POA: Diagnosis not present

## 2021-05-10 DIAGNOSIS — G4733 Obstructive sleep apnea (adult) (pediatric): Secondary | ICD-10-CM | POA: Diagnosis not present

## 2021-05-10 DIAGNOSIS — L4050 Arthropathic psoriasis, unspecified: Secondary | ICD-10-CM | POA: Diagnosis not present

## 2021-05-10 DIAGNOSIS — D6869 Other thrombophilia: Secondary | ICD-10-CM | POA: Diagnosis not present

## 2021-05-10 DIAGNOSIS — I25119 Atherosclerotic heart disease of native coronary artery with unspecified angina pectoris: Secondary | ICD-10-CM | POA: Diagnosis not present

## 2021-05-10 DIAGNOSIS — I509 Heart failure, unspecified: Secondary | ICD-10-CM | POA: Diagnosis not present

## 2021-05-10 DIAGNOSIS — Z008 Encounter for other general examination: Secondary | ICD-10-CM | POA: Diagnosis not present

## 2021-05-10 DIAGNOSIS — I110 Hypertensive heart disease with heart failure: Secondary | ICD-10-CM | POA: Diagnosis not present

## 2021-05-10 DIAGNOSIS — E261 Secondary hyperaldosteronism: Secondary | ICD-10-CM | POA: Diagnosis not present

## 2021-05-10 DIAGNOSIS — M109 Gout, unspecified: Secondary | ICD-10-CM | POA: Diagnosis not present

## 2021-05-10 DIAGNOSIS — I429 Cardiomyopathy, unspecified: Secondary | ICD-10-CM | POA: Diagnosis not present

## 2021-05-10 DIAGNOSIS — M1990 Unspecified osteoarthritis, unspecified site: Secondary | ICD-10-CM | POA: Diagnosis not present

## 2021-05-31 DIAGNOSIS — I4819 Other persistent atrial fibrillation: Secondary | ICD-10-CM | POA: Diagnosis not present

## 2021-06-06 DIAGNOSIS — I480 Paroxysmal atrial fibrillation: Secondary | ICD-10-CM | POA: Diagnosis not present

## 2021-06-22 DIAGNOSIS — I4819 Other persistent atrial fibrillation: Secondary | ICD-10-CM | POA: Diagnosis not present

## 2021-07-04 DIAGNOSIS — Z951 Presence of aortocoronary bypass graft: Secondary | ICD-10-CM | POA: Diagnosis not present

## 2021-07-04 DIAGNOSIS — I351 Nonrheumatic aortic (valve) insufficiency: Secondary | ICD-10-CM | POA: Diagnosis not present

## 2021-07-04 DIAGNOSIS — I2510 Atherosclerotic heart disease of native coronary artery without angina pectoris: Secondary | ICD-10-CM | POA: Diagnosis not present

## 2021-07-04 DIAGNOSIS — I4819 Other persistent atrial fibrillation: Secondary | ICD-10-CM | POA: Diagnosis not present

## 2021-07-04 DIAGNOSIS — E7800 Pure hypercholesterolemia, unspecified: Secondary | ICD-10-CM | POA: Diagnosis not present

## 2021-07-04 DIAGNOSIS — I10 Essential (primary) hypertension: Secondary | ICD-10-CM | POA: Diagnosis not present

## 2021-07-11 ENCOUNTER — Telehealth: Payer: Self-pay | Admitting: Pharmacist

## 2021-07-11 NOTE — Chronic Care Management (AMB) (Signed)
Chronic Care Management Pharmacy Assistant   Name: [name] [name]  MRN: [id number] DOB: 1951/05/19  Reason for Encounter: Disease State / Hypertension Assessment Call   Conditions to be addressed/monitored: HTN   Recent office visits:  none  Recent consult visits:  05/31/2021 [name] Pulling MD ([facility]) - Patient was seen for Persistant Atrial Fibrillation. No medication changes noted. No follow up noted.  Hospital visits:  none  Medications: Outpatient Encounter Medications as of 07/11/2021  Medication Sig   acetaminophen (TYLENOL) 325 MG tablet Take 2 tablets (650 mg total) by mouth every 6 (six) hours as needed for up to 30 doses for mild pain or moderate pain.   atorvastatin (LIPITOR) 80 MG tablet TAKE 1 TABLET BY MOUTH EVERY DAY   cholecalciferol (VITAMIN D) 1000 UNITS tablet Take 1,000 Units by mouth daily.   Coenzyme Q10 (CO Q 10 PO) Take 100 mg by mouth daily.    colchicine 0.6 MG tablet TAKE 1 TABLET BY MOUTH TWICE A DAY (Patient taking differently: Take 0.6 mg by mouth daily as needed (gout).)   ELIQUIS 5 MG TABS tablet TAKE 1 TABLET BY MOUTH TWICE A DAY   furosemide (LASIX) 20 MG tablet Take 1 tablet (20 mg total) by mouth 2 (two) times daily.   losartan (COZAAR) 50 MG tablet TAKE 1 TABLET BY MOUTH EVERY DAY   methotrexate 2.5 MG tablet Take 2.5 mg by mouth once a week. Caution:Chemotherapy. Protect from light. Take on Tuesdays   metoprolol succinate (TOPROL-XL) 50 MG 24 hr tablet Take 1 tablet (50 mg total) by mouth daily. Take with or immediately following a meal.   metoprolol tartrate (LOPRESSOR) 25 MG tablet Take 0.5 tablets (12.5 mg total) by mouth 2 (two) times daily as needed (palpitations/heart racing).   multivitamin (THERAGRAN) per tablet Take 1 tablet by mouth daily.   nitroGLYCERIN (NITROSTAT) 0.4 MG SL tablet Place 1 tablet (0.4 mg total) under the tongue every 5 (five) minutes as needed for chest pain.   potassium chloride SA (K-DUR,KLOR-CON) 20  MEQ tablet Take 1 tablet (20 mEq total) by mouth daily.   tadalafil (CIALIS) 20 MG tablet Take one tablet by mouth every other day as needed for erectile dysfunction   No facility-administered encounter medications on file as of 07/11/2021.  Fill History: ELIQUIS 5 MG TABLET 07/03/2021 30   ATORVASTATIN 80 MG TABLET 04/11/2021 90   COLCHICINE 0.6 MG TABLET 04/13/2021 90   FUROSEMIDE 20 MG TABLET 05/10/2021 90   LOSARTAN POTASSIUM 50 MG TAB 04/25/2021 90   METOPROLOL SUCC ER 50 MG TAB 07/05/2021 90   NITROGLYCERIN 0.4 MG TABLET SL 05/04/2021 5   Reviewed chart prior to disease state call. Spoke with patient regarding BP  Recent Office Vitals: BP Readings from Last 3 Encounters:  02/28/21 120/62  02/27/21 122/66  01/30/21 124/64   Pulse Readings from Last 3 Encounters:  02/28/21 (!) 116  02/27/21 89  01/30/21 67    Wt Readings from Last 3 Encounters:  02/28/21 199 lb 6.4 oz (90.4 kg)  02/27/21 200 lb 3.2 oz (90.8 kg)  01/30/21 205 lb (93 kg)     Kidney Function Lab Results  Component Value Date/Time   CREATININE 0.84 02/28/2021 09:24 AM   CREATININE 0.95 10/19/2020 02:50 PM   CREATININE 0.79 02/26/2020 09:16 AM   GFR 87.11 02/28/2021 09:24 AM   GFRNONAA >60 10/19/2020 02:50 PM   GFRAA 90 08/27/2019 09:09 AM    BMP Latest Ref Rng & Units 02/28/2021  10/19/2020 02/26/2020  Glucose 70 - 99 mg/dL 91 115(H) 90  BUN 6 - 23 mg/dL 13 10 10   Creatinine 0.40 - 1.50 mg/dL 0.84 0.95 0.79  BUN/Creat Ratio 6 - 22 (calc) - - NOT APPLICABLE  Sodium 333 - 145 mEq/L 139 139 141  Potassium 3.5 - 5.1 mEq/L 4.3 4.0 4.3  Chloride 96 - 112 mEq/L 101 103 103  CO2 19 - 32 mEq/L 30 25 26   Calcium 8.4 - 10.5 mg/dL 9.9 9.4 9.5    Current antihypertensive regimen:  Losartan 50 mg daily Metoprolol 50 mg 1.5 tablets daily  How often are you checking your Blood Pressure? Patient is checking blood pressures weekly  Current home BP readings: Patient states his last reading was 130/70 and  stays in that range.  What recent interventions/DTPs have been made by any provider to improve Blood Pressure control since last CPP Visit:[facility] [name] [name] at [facility] recently increased Metoprolol to 75 mg daily.  Patient is scheduled for another ablation on 09/12/2020.  Any recent hospitalizations or ED visits since last visit with CPP? No recent hospital visits  What diet changes have been made to improve Blood Pressure Control?  Patient states his diet consists of fruit and sometimes toast with peanut butter for breakfast. He eats meat twice a week, most of his diet consists of vegetables.  He does not eat fast food and he does eat out often however, makes healthy choices like salads.  What exercise is being done to improve your Blood Pressure Control?  Patient does yard work and walks, he also goes to the gym 3 times per week and has a Physiological scientist that works with him.   Adherence Review: Is the patient currently on ACE/ARB medication? Yes Does the patient have >5 day gap between last estimated fill dates? No  Care Gaps: AWV - completed 01/30/2021 Last BP - 120/62 on 02/28/2021 Tetanus/TDAP - overdue  Covid 19 vaccine - overdue  Star Rating Drugs: Atorvastatin 80mg  - last filled 04/11/2021 90DS at [facility]  Losartan 50mg  - last filled 04/25/2021 90DS at [facility] Pharmacist Assistant [phone]

## 2021-07-28 ENCOUNTER — Ambulatory Visit (INDEPENDENT_AMBULATORY_CARE_PROVIDER_SITE_OTHER): Payer: Medicare HMO | Admitting: Family Medicine

## 2021-07-28 VITALS — BP 120/72 | HR 51 | Temp 97.7°F | Ht 73.0 in | Wt 207.9 lb

## 2021-07-28 DIAGNOSIS — R251 Tremor, unspecified: Secondary | ICD-10-CM | POA: Diagnosis not present

## 2021-07-28 DIAGNOSIS — N529 Male erectile dysfunction, unspecified: Secondary | ICD-10-CM | POA: Diagnosis not present

## 2021-07-28 DIAGNOSIS — F439 Reaction to severe stress, unspecified: Secondary | ICD-10-CM

## 2021-07-28 DIAGNOSIS — R69 Illness, unspecified: Secondary | ICD-10-CM | POA: Diagnosis not present

## 2021-07-28 MED ORDER — TADALAFIL 20 MG PO TABS: 10.0000 mg | ORAL_TABLET | ORAL | 11 refills | Status: DC | PRN

## 2021-07-28 NOTE — Progress Notes (Signed)
Established Patient Office Visit  Subjective:  Patient ID: [name] [name], male    DOB: [birth year]  Age: 71 y.o. MRN: [id number]  CC:  Chief Complaint  Patient presents with   Tremors    HPI [name] [name] presents for the following items  He has tremor which is relatively mild both upper extremities.  He had mentioned this at physical last year.  Thinks his mom had similar tremor.  He has not noted any involvement of the head or neck.  No lower extremity involvement.  Has not noted any muscle stiffness or bradykinesia's.  He does take beta-blocker at baseline 50 mg 1 and half tablets daily  He has history of low libido.  Previous testosterone screen normal.  He also has erectile dysfunction.  He is requesting prescription for Cialis.  He does have nitroglycerin on his list and knows that this cannot be mixed but he has not taken nitroglycerin in 17 years and this was given only after his bypass surgery.  He has had some stress issues and denies feeling significantly depressed but would like to consider counseling.  He is requesting resources/options  Past Medical History:  Diagnosis Date   Chicken pox    Coronary artery disease    Severe three-vessel coronary artery disease with ejection fraction of 65%   GERD (gastroesophageal reflux disease)    History of gout    History of psoriasis    Hypercholesterolemia    Hypertension    OSA (obstructive sleep apnea)    Moderate with AHI of 16.9/hr now on CPAP at 10cm H2O   Psoriatic arthritis ([place])     Past Surgical History:  Procedure Laterality Date   ATRIAL FLUTTER ABLATION  03/30/2014   ATRIAL FLUTTER ABLATION N/A 03/30/2014   Procedure: ATRIAL FLUTTER ABLATION;  Surgeon: Coralyn Mark, MD;  Location: [facility] CATH [facility];  Service: Cardiovascular;  Laterality: N/A;   CARDIAC CATHETERIZATION  09/2003   Ejection fraction was estimated at 65%.   CARDIOVERSION N/A 03/15/2014   Procedure: CARDIOVERSION;  Surgeon: Lelon Perla, MD;   Location: [facility] ENDOSCOPY;  Service: Cardiovascular;  Laterality: N/A;   CARDIOVERSION N/A 07/17/2017   Procedure: CARDIOVERSION;  Surgeon: Sueanne Margarita, MD;  Location: [facility] ENDOSCOPY;  Service: Cardiovascular;  Laterality: N/A;   CORONARY ARTERY BYPASS GRAFT  09/2003   Lima-lad,svg-diag,svg-om/distal LCX,svg-pda   TEE WITHOUT CARDIOVERSION N/A 03/15/2014   Procedure: TRANSESOPHAGEAL ECHOCARDIOGRAM (TEE);  Surgeon: Lelon Perla, MD;  Location: [facility] ENDOSCOPY;  Service: Cardiovascular;  Laterality: N/A;   TEE WITHOUT CARDIOVERSION N/A 07/17/2017   Procedure: TRANSESOPHAGEAL ECHOCARDIOGRAM (TEE);  Surgeon: Sueanne Margarita, MD;  Location: [facility] ENDOSCOPY;  Service: Cardiovascular;  Laterality: N/A;   TONSILLECTOMY  1950's    Family History  Problem Relation Age of Onset   Heart disease Mother        cabg   Cancer Mother        breast   Stroke Mother     Social History   Socioeconomic History   Marital status: Married    Spouse name: Not on file   Number of children: 1   Years of education: 4 years college   Highest education level: Bachelor's degree (e.g., BA, AB, BS)  Occupational History   Occupation: Print production planner: [employer]    Comment: works part time   Tobacco Use   Smoking status: Former    Packs/day: 0.50    Years: 5.00    Pack  years: 2.50    Types: Cigarettes   Smokeless tobacco: Never   Tobacco comments:    "quit smoking cigarettes in the late 1970's"  Vaping Use   Vaping Use: Never used  Substance and Sexual Activity   Alcohol use: Yes    Alcohol/week: 4.0 standard drinks    Types: 2 Cans of beer, 2 Shots of liquor per week    Comment: 1-2  drinks a week   Drug use: No   Sexual activity: Yes  Other Topics Concern   Not on file  Social History Narrative   Working part time   Married; [place] of 2   1 child   Social Determinants of Radio broadcast assistant Strain: Low Risk    Difficulty of Paying Living Expenses: Not hard at all   Food Insecurity: No Food Insecurity   Worried About Charity fundraiser in the Last Year: Never true   Arboriculturist in the Last Year: Never true  Transportation Needs: No Transportation Needs   Lack of Transportation (Medical): No   Lack of Transportation (Non-Medical): No  Physical Activity: Sufficiently Active   Days of Exercise per Week: 5 days   Minutes of Exercise per Session: 60 min  Stress: No Stress Concern Present   Feeling of Stress : Not at all  Social Connections: Socially Integrated   Frequency of Communication with Friends and Family: Twice a week   Frequency of Social Gatherings with Friends and Family: Twice a week   Attends Religious Services: More than 4 times per year   Active Member of [employer] or Organizations: Yes   Attends Music therapist: More than 4 times per year   Marital Status: Married  Human resources officer Violence: Not At Risk   Fear of Current or Ex-Partner: No   Emotionally Abused: No   Physically Abused: No   Sexually Abused: No    Outpatient Medications Prior to Visit  Medication Sig Dispense Refill   acetaminophen (TYLENOL) 325 MG tablet Take 2 tablets (650 mg total) by mouth every 6 (six) hours as needed for up to 30 doses for mild pain or moderate pain. 30 tablet 0   atorvastatin (LIPITOR) 80 MG tablet TAKE 1 TABLET BY MOUTH EVERY DAY 90 tablet 1   cholecalciferol (VITAMIN D) 1000 UNITS tablet Take 1,000 Units by mouth daily.     Coenzyme Q10 (CO Q 10 PO) Take 100 mg by mouth daily.      colchicine 0.6 MG tablet TAKE 1 TABLET BY MOUTH TWICE A DAY (Patient taking differently: Take 0.6 mg by mouth daily as needed (gout).) 180 tablet 1   ELIQUIS 5 MG TABS tablet TAKE 1 TABLET BY MOUTH TWICE A DAY 180 tablet 1   furosemide (LASIX) 20 MG tablet Take 1 tablet (20 mg total) by mouth 2 (two) times daily. 180 tablet 3   losartan (COZAAR) 50 MG tablet TAKE 1 TABLET BY MOUTH EVERY DAY 90 tablet 3   methotrexate 2.5 MG tablet Take 2.5 mg by  mouth once a week. Caution:Chemotherapy. Protect from light. Take on Tuesdays     metoprolol tartrate (LOPRESSOR) 25 MG tablet Take 0.5 tablets (12.5 mg total) by mouth 2 (two) times daily as needed (palpitations/heart racing). 12 tablet 0   multivitamin (THERAGRAN) per tablet Take 1 tablet by mouth daily.     nitroGLYCERIN (NITROSTAT) 0.4 MG SL tablet Place 1 tablet (0.4 mg total) under the tongue every 5 (five) minutes as needed for  chest pain. 25 tablet 2   potassium chloride SA (K-DUR,KLOR-CON) 20 MEQ tablet Take 1 tablet (20 mEq total) by mouth daily. 30 tablet 6   tadalafil (CIALIS) 20 MG tablet Take one tablet by mouth every other day as needed for erectile dysfunction 10 tablet 5   metoprolol succinate (TOPROL-XL) 50 MG 24 hr tablet Take 1 tablet (50 mg total) by mouth daily. Take with or immediately following a meal. (Patient taking differently: Take 75 mg by mouth daily. Take with or immediately following a meal.) 90 tablet 3   No facility-administered medications prior to visit.    No Known Allergies  ROS Review of Systems  Constitutional:  Negative for fatigue and unexpected weight change.  Eyes:  Negative for visual disturbance.  Respiratory:  Negative for cough, chest tightness and shortness of breath.   Cardiovascular:  Negative for chest pain, palpitations and leg swelling.  Neurological:  Positive for tremors. Negative for dizziness, seizures, syncope, speech difficulty, weakness, light-headedness and headaches.  Psychiatric/Behavioral:  Negative for dysphoric mood.      Objective:    Physical Exam Constitutional:      Appearance: He is well-developed.  HENT:     Right Ear: External ear normal.     Left Ear: External ear normal.  Eyes:     Pupils: Pupils are equal, round, and reactive to light.  Neck:     Thyroid: No thyromegaly.  Cardiovascular:     Rate and Rhythm: Normal rate and regular rhythm.  Pulmonary:     Effort: Pulmonary effort is normal. No  respiratory distress.     Breath sounds: Normal breath sounds. No wheezing or rales.  Musculoskeletal:     Cervical back: Neck supple.  Neurological:     Mental Status: He is alert and oriented to person, place, and time.     Comments: He has subtle tremor upper extremities involving mostly the hands.  This does not seem to extinguish with movement.  No upper extremity weakness.  No obvious head or neck tremor.  No bradykinesia's.  No muscle rigidity.  Gait normal.    BP 120/72 (BP Location: Right Arm, Patient Position: Sitting, Cuff Size: Normal)    Pulse (!) 51    Temp 97.7 F (36.5 C) (Oral)    Ht 6\' 1"  (1.854 m)    Wt 207 lb 14.4 oz (94.3 kg)    SpO2 97%    BMI 27.43 kg/m  Wt Readings from Last 3 Encounters:  07/28/21 207 lb 14.4 oz (94.3 kg)  02/28/21 199 lb 6.4 oz (90.4 kg)  02/27/21 200 lb 3.2 oz (90.8 kg)     Health Maintenance Due  Topic Date Due   Zoster Vaccines- Shingrix (1 of 2) Never done   TETANUS/TDAP  07/23/2018    There are no preventive care reminders to display for this patient.  Lab Results  Component Value Date   TSH 0.78 02/28/2021   Lab Results  Component Value Date   WBC 6.6 02/28/2021   HGB 15.3 02/28/2021   HCT 44.6 02/28/2021   MCV 91.8 02/28/2021   PLT 166.0 02/28/2021   Lab Results  Component Value Date   NA 139 02/28/2021   K 4.3 02/28/2021   CO2 30 02/28/2021   GLUCOSE 91 02/28/2021   BUN 13 02/28/2021   CREATININE 0.84 02/28/2021   BILITOT 1.2 02/28/2021   ALKPHOS 84 02/28/2021   AST 20 02/28/2021   ALT 15 02/28/2021   PROT 6.8 02/28/2021   ALBUMIN  4.5 02/28/2021   CALCIUM 9.9 02/28/2021   ANIONGAP 11 10/19/2020   GFR 87.11 02/28/2021   Lab Results  Component Value Date   CHOL 136 02/28/2021   Lab Results  Component Value Date   HDL 65.60 02/28/2021   Lab Results  Component Value Date   LDLCALC 59 02/28/2021   Lab Results  Component Value Date   TRIG 54.0 02/28/2021   Lab Results  Component Value Date    CHOLHDL 2 02/28/2021   No results found for: HGBA1C    Assessment & Plan:   #1 bilateral upper extremity tremor most involving the hands.  Suspect probably familial or hereditary tremor.  No obvious features to suggest likely Parkinson's disease.  Patient already on beta-blocker.  We did discuss possible neurology referral to get second opinion to further evaluate but at this point he agrees to observe.  We discussed things that could trigger essential tremor such as fatigue, stress, caffeine  #2 erectile dysfunction.  Patient has taken Cialis in the past.  We agreed to Cialis 20 mg 1/2 tablet to 1 tablet every other day as needed.  He knows this cannot be mixed with nitroglycerin.  #3 stress issues.  Patient requested counseling options.  He was given number for our behavioral health division but not sure they participate with his insurance.  Meds ordered this encounter  Medications   tadalafil (CIALIS) 20 MG tablet    Sig: Take 0.5-1 tablets (10-20 mg total) by mouth every other day as needed for erectile dysfunction.    Dispense:  10 tablet    Refill:  11    Follow-up: No follow-ups on file.    [name], MD

## 2021-07-28 NOTE — Patient Instructions (Signed)
Let me know if you would like to see Dr Wells Guiles Tat (neurology) regarding tremor.

## 2021-09-05 DIAGNOSIS — G4733 Obstructive sleep apnea (adult) (pediatric): Secondary | ICD-10-CM | POA: Diagnosis not present

## 2021-09-05 DIAGNOSIS — I498 Other specified cardiac arrhythmias: Secondary | ICD-10-CM | POA: Diagnosis not present

## 2021-09-05 DIAGNOSIS — I483 Typical atrial flutter: Secondary | ICD-10-CM | POA: Diagnosis not present

## 2021-09-05 DIAGNOSIS — Z01818 Encounter for other preprocedural examination: Secondary | ICD-10-CM | POA: Diagnosis not present

## 2021-09-05 DIAGNOSIS — L4050 Arthropathic psoriasis, unspecified: Secondary | ICD-10-CM | POA: Diagnosis not present

## 2021-09-05 DIAGNOSIS — I2510 Atherosclerotic heart disease of native coronary artery without angina pectoris: Secondary | ICD-10-CM | POA: Diagnosis not present

## 2021-09-05 DIAGNOSIS — I4819 Other persistent atrial fibrillation: Secondary | ICD-10-CM | POA: Diagnosis not present

## 2021-09-05 DIAGNOSIS — Z951 Presence of aortocoronary bypass graft: Secondary | ICD-10-CM | POA: Diagnosis not present

## 2021-09-05 DIAGNOSIS — I351 Nonrheumatic aortic (valve) insufficiency: Secondary | ICD-10-CM | POA: Diagnosis not present

## 2021-09-05 DIAGNOSIS — I10 Essential (primary) hypertension: Secondary | ICD-10-CM | POA: Diagnosis not present

## 2021-09-07 DIAGNOSIS — G4733 Obstructive sleep apnea (adult) (pediatric): Secondary | ICD-10-CM | POA: Diagnosis not present

## 2021-09-11 DIAGNOSIS — Z7901 Long term (current) use of anticoagulants: Secondary | ICD-10-CM | POA: Diagnosis not present

## 2021-09-11 DIAGNOSIS — I4819 Other persistent atrial fibrillation: Secondary | ICD-10-CM | POA: Diagnosis not present

## 2021-09-11 DIAGNOSIS — G4733 Obstructive sleep apnea (adult) (pediatric): Secondary | ICD-10-CM | POA: Diagnosis not present

## 2021-09-11 DIAGNOSIS — Z01810 Encounter for preprocedural cardiovascular examination: Secondary | ICD-10-CM | POA: Diagnosis not present

## 2021-09-11 DIAGNOSIS — I4892 Unspecified atrial flutter: Secondary | ICD-10-CM | POA: Diagnosis not present

## 2021-09-11 DIAGNOSIS — Z20822 Contact with and (suspected) exposure to covid-19: Secondary | ICD-10-CM | POA: Diagnosis not present

## 2021-09-11 DIAGNOSIS — Z8679 Personal history of other diseases of the circulatory system: Secondary | ICD-10-CM | POA: Diagnosis not present

## 2021-09-12 DIAGNOSIS — I491 Atrial premature depolarization: Secondary | ICD-10-CM | POA: Diagnosis not present

## 2021-09-12 DIAGNOSIS — I10 Essential (primary) hypertension: Secondary | ICD-10-CM | POA: Diagnosis not present

## 2021-09-12 DIAGNOSIS — I2510 Atherosclerotic heart disease of native coronary artery without angina pectoris: Secondary | ICD-10-CM | POA: Diagnosis not present

## 2021-09-12 DIAGNOSIS — G4733 Obstructive sleep apnea (adult) (pediatric): Secondary | ICD-10-CM | POA: Diagnosis not present

## 2021-09-12 DIAGNOSIS — I471 Supraventricular tachycardia: Secondary | ICD-10-CM | POA: Diagnosis not present

## 2021-09-12 DIAGNOSIS — Z951 Presence of aortocoronary bypass graft: Secondary | ICD-10-CM | POA: Diagnosis not present

## 2021-09-12 DIAGNOSIS — I480 Paroxysmal atrial fibrillation: Secondary | ICD-10-CM | POA: Diagnosis not present

## 2021-09-12 DIAGNOSIS — M109 Gout, unspecified: Secondary | ICD-10-CM | POA: Diagnosis not present

## 2021-09-12 DIAGNOSIS — I4819 Other persistent atrial fibrillation: Secondary | ICD-10-CM | POA: Diagnosis not present

## 2021-09-12 DIAGNOSIS — K219 Gastro-esophageal reflux disease without esophagitis: Secondary | ICD-10-CM | POA: Diagnosis not present

## 2021-09-13 DIAGNOSIS — I491 Atrial premature depolarization: Secondary | ICD-10-CM | POA: Diagnosis not present

## 2021-09-13 DIAGNOSIS — I10 Essential (primary) hypertension: Secondary | ICD-10-CM | POA: Diagnosis not present

## 2021-09-13 DIAGNOSIS — K219 Gastro-esophageal reflux disease without esophagitis: Secondary | ICD-10-CM | POA: Diagnosis not present

## 2021-09-13 DIAGNOSIS — G4733 Obstructive sleep apnea (adult) (pediatric): Secondary | ICD-10-CM | POA: Diagnosis not present

## 2021-09-13 DIAGNOSIS — I4819 Other persistent atrial fibrillation: Secondary | ICD-10-CM | POA: Diagnosis not present

## 2021-09-13 DIAGNOSIS — I2510 Atherosclerotic heart disease of native coronary artery without angina pectoris: Secondary | ICD-10-CM | POA: Diagnosis not present

## 2021-09-13 DIAGNOSIS — M109 Gout, unspecified: Secondary | ICD-10-CM | POA: Diagnosis not present

## 2021-09-13 DIAGNOSIS — I480 Paroxysmal atrial fibrillation: Secondary | ICD-10-CM | POA: Diagnosis not present

## 2021-09-13 DIAGNOSIS — Z951 Presence of aortocoronary bypass graft: Secondary | ICD-10-CM | POA: Diagnosis not present

## 2021-09-13 DIAGNOSIS — I471 Supraventricular tachycardia: Secondary | ICD-10-CM | POA: Diagnosis not present

## 2021-09-13 LAB — BASIC METABOLIC PANEL
BUN: 8 (ref 4–21)
CO2: 28 — AB (ref 13–22)
Chloride: 105 (ref 99–108)
Creatinine: 0.9 (ref 0.6–1.3)
Glucose: 102
Potassium: 4.4 (ref 3.4–5.3)
Sodium: 141 (ref 137–147)

## 2021-09-13 LAB — COMPREHENSIVE METABOLIC PANEL: Calcium: 9.2 (ref 8.7–10.7)

## 2021-09-13 LAB — CBC AND DIFFERENTIAL
HCT: 42 (ref 41–53)
Hemoglobin: 14 (ref 13.5–17.5)
Platelets: 160 (ref 150–399)
WBC: 7.6

## 2021-09-13 LAB — POCT INR: INR: 1.3 — AB (ref 0.9–1.1)

## 2021-09-18 ENCOUNTER — Encounter: Payer: Self-pay | Admitting: Family Medicine

## 2021-09-18 ENCOUNTER — Ambulatory Visit (INDEPENDENT_AMBULATORY_CARE_PROVIDER_SITE_OTHER): Payer: Medicare HMO | Admitting: Family Medicine

## 2021-09-18 ENCOUNTER — Other Ambulatory Visit: Payer: Self-pay

## 2021-09-18 ENCOUNTER — Ambulatory Visit (INDEPENDENT_AMBULATORY_CARE_PROVIDER_SITE_OTHER): Payer: Medicare HMO

## 2021-09-18 VITALS — BP 135/59 | HR 62 | Temp 98.2°F | Wt 213.6 lb

## 2021-09-18 DIAGNOSIS — Z8739 Personal history of other diseases of the musculoskeletal system and connective tissue: Secondary | ICD-10-CM

## 2021-09-18 DIAGNOSIS — M25562 Pain in left knee: Secondary | ICD-10-CM | POA: Diagnosis not present

## 2021-09-18 DIAGNOSIS — M25469 Effusion, unspecified knee: Secondary | ICD-10-CM | POA: Diagnosis not present

## 2021-09-18 DIAGNOSIS — Z9889 Other specified postprocedural states: Secondary | ICD-10-CM | POA: Diagnosis not present

## 2021-09-18 DIAGNOSIS — L409 Psoriasis, unspecified: Secondary | ICD-10-CM | POA: Diagnosis not present

## 2021-09-18 DIAGNOSIS — M25462 Effusion, left knee: Secondary | ICD-10-CM | POA: Diagnosis not present

## 2021-09-18 DIAGNOSIS — R600 Localized edema: Secondary | ICD-10-CM | POA: Diagnosis not present

## 2021-09-18 LAB — CBC WITH DIFFERENTIAL/PLATELET
Basophils Absolute: 0 10*3/uL (ref 0.0–0.1)
Basophils Relative: 0.6 % (ref 0.0–3.0)
Eosinophils Absolute: 0.1 10*3/uL (ref 0.0–0.7)
Eosinophils Relative: 1.8 % (ref 0.0–5.0)
HCT: 41.2 % (ref 39.0–52.0)
Hemoglobin: 14 g/dL (ref 13.0–17.0)
Lymphocytes Relative: 20.8 % (ref 12.0–46.0)
Lymphs Abs: 1.5 10*3/uL (ref 0.7–4.0)
MCHC: 34.1 g/dL (ref 30.0–36.0)
MCV: 92.3 fl (ref 78.0–100.0)
Monocytes Absolute: 0.9 10*3/uL (ref 0.1–1.0)
Monocytes Relative: 13.3 % — ABNORMAL HIGH (ref 3.0–12.0)
Neutro Abs: 4.5 10*3/uL (ref 1.4–7.7)
Neutrophils Relative %: 63.5 % (ref 43.0–77.0)
Platelets: 151 10*3/uL (ref 150.0–400.0)
RBC: 4.47 Mil/uL (ref 4.22–5.81)
RDW: 13.1 % (ref 11.5–15.5)
WBC: 7.1 10*3/uL (ref 4.0–10.5)

## 2021-09-18 LAB — URIC ACID: Uric Acid, Serum: 7.1 mg/dL (ref 4.0–7.8)

## 2021-09-18 LAB — BASIC METABOLIC PANEL
BUN: 18 mg/dL (ref 6–23)
CO2: 28 mEq/L (ref 19–32)
Calcium: 9.6 mg/dL (ref 8.4–10.5)
Chloride: 104 mEq/L (ref 96–112)
Creatinine, Ser: 1 mg/dL (ref 0.40–1.50)
GFR: 74.89 mL/min (ref >60.00)
Glucose, Bld: 107 mg/dL — ABNORMAL HIGH (ref 70–99)
Potassium: 3.8 mEq/L (ref 3.5–5.1)
Sodium: 137 mEq/L (ref 135–145)

## 2021-09-18 NOTE — Progress Notes (Signed)
Subjective:    Patient ID: [name] [name], male    DOB: [birth year], 71 y.o.   MRN: [id number]  Chief Complaint  Patient presents with   Joint Swelling    Left knee pain and swelling since last Wednesday. Pain is sharp throbbing pain, Friday and Saturday were the worst days. Take ibuprofen, has gout and thinks it is gout, has taken colchicine.    HPI Patient was seen today for acute concern.  Pt has a h/o gout, with typical flairs in knees, ankles, wrist.  Patient endorses left knee pain and swelling x 5 days, since last Wednesday.  Took 2 colchicine on Wednesday and 1 on Thursday with no improvement in symptoms.  Took another dose over the weekend and today.  Also tried ibuprofen which helps some.  Pt denies injury or increased physical activity as had cardiac ablation Tuesday at [facility].  On Wednesday both knees feel stiff left knee continues to hurt.  Patient is not eating any foods that typically cause gout symptoms.  Patient states his wife was concerned that he may have psoriatic arthritis given his history of psoriasis.  Pt currently taking methotrexate 2.5 mg daily with folic acid times several years but has not noticed an improvement in his psoriasis.  Inquires about seeing a rheumatologist.  Has an appointment next week with dermatology.   Pt mentions difficulty fully flexing L lower extremity at knee times a while.    Past Medical History:  Diagnosis Date   Chicken pox    Coronary artery disease    Severe three-vessel coronary artery disease with ejection fraction of 65%   GERD (gastroesophageal reflux disease)    History of gout    History of psoriasis    Hypercholesterolemia    Hypertension    OSA (obstructive sleep apnea)    Moderate with AHI of 16.9/hr now on CPAP at 10cm H2O   Psoriatic arthritis ([facility])     No Known Allergies  ROS General: Denies fever, chills, night sweats, changes in weight, changes in appetite HEENT: Denies headaches, ear pain, changes in vision,  rhinorrhea, sore throat CV: Denies CP, palpitations, SOB, orthopnea Pulm: Denies SOB, cough, wheezing GI: Denies abdominal pain, nausea, vomiting, diarrhea, constipation GU: Denies dysuria, hematuria, frequency Msk: Denies muscle cramps, joint pains  +L knee pain Neuro: Denies weakness, numbness, tingling Skin: Denies rashes, bruising + psoriasis Psych: Denies depression, anxiety, hallucinations    Objective:    Blood pressure (!) 135/59, pulse 62, temperature 98.2 F (36.8 C), temperature source Oral, weight 213 lb 9.6 oz (96.9 kg), SpO2 98 %.  Gen. Pleasant, well-nourished, in no distress, normal affect   HEENT: [place]/AT, face symmetric, conjunctiva clear, no scleral icterus, PERRLA, EOMI, nares patent without drainage, pharynx without erythema or exudate. Neck: No JVD, no thyromegaly, no carotid bruits Lungs: no accessory muscle use, CTAB, no wheezes or rales Cardiovascular: RRR, no m/r/g, no peripheral edema Musculoskeletal: L knee edema, increased warmth, no erythema.  Mild crepitus of bilateral knees.  Mild effusion of left knee.  TTP of the medial joint line and superior to left tibial tuberosity.  ~ 90 degree flexion of left lower leg. no deformities, no cyanosis or clubbing, normal tone Neuro:  A&Ox3, CN II-XII intact, normal gait Skin:  Warm, dry, intact.  Diffuse psoriasis plaques noted on bilateral Les.   Wt Readings from Last 3 Encounters:  09/18/21 213 lb 9.6 oz (96.9 kg)  07/28/21 207 lb 14.4 oz (94.3 kg)  02/28/21 199 lb 6.4 oz (  90.4 kg)    Lab Results  Component Value Date   WBC 6.6 02/28/2021   HGB 15.3 02/28/2021   HCT 44.6 02/28/2021   PLT 166.0 02/28/2021   GLUCOSE 91 02/28/2021   CHOL 136 02/28/2021   TRIG 54.0 02/28/2021   HDL 65.60 02/28/2021   LDLCALC 59 02/28/2021   ALT 15 02/28/2021   AST 20 02/28/2021   NA 139 02/28/2021   K 4.3 02/28/2021   CL 101 02/28/2021   CREATININE 0.84 02/28/2021   BUN 13 02/28/2021   CO2 30 02/28/2021   TSH 0.78  02/28/2021   PSA 0.62 02/28/2021   INR 1.08 06/12/2009    Assessment/Plan:  Acute pain of left knee -New problem -Discussed possible causes of acute knee pain with mild effusion including osteoarthritis, gout, psoriatic arthritis, septic joint, etc. -We will obtain labs including uric acid and imaging to assess for arthritis -Discussed supportive care including elevation, compression, ice, rest, topical analgesics or NSAIDs prn -Further recommendations based on labs and imaging. - Plan: DG Knee Complete 4 Views Left, CBC with Differential/Platelet, Ambulatory referral to Rheumatology  History of gout  -States current knee pain feels different from typical gout flares. -His knee pain continued despite several doses of colchicine will obtain uric acid level - Plan: DG Knee Complete 4 Views Left, CBC with Differential/Platelet, Uric Acid, Basic metabolic panel  Psoriasis -Chronic problem -Continue methotrexate 2.5 mg and folic acid -Encouraged to keep follow-up with dermatology - Plan: Ambulatory referral to Rheumatology, Folate  Edema of knee -New problem  - Plan: DG Knee Complete 4 Views Left, CBC with Differential/Platelet, Uric Acid, Ambulatory referral to Rheumatology  History of cardiac radiofrequency ablation -stable -s/p ablation 6 days ago at [facility] for Afib   F/u prn  Grier Mitts, MD

## 2021-09-19 ENCOUNTER — Ambulatory Visit (INDEPENDENT_AMBULATORY_CARE_PROVIDER_SITE_OTHER): Payer: Medicare HMO | Admitting: Psychologist

## 2021-09-19 DIAGNOSIS — F341 Dysthymic disorder: Secondary | ICD-10-CM | POA: Diagnosis not present

## 2021-09-19 DIAGNOSIS — R69 Illness, unspecified: Secondary | ICD-10-CM | POA: Diagnosis not present

## 2021-09-19 LAB — FOLATE: Folate: 8 ng/mL (ref >5.9)

## 2021-09-19 NOTE — Progress Notes (Signed)
                [name], PsyD 

## 2021-09-19 NOTE — Plan of Care (Signed)

## 2021-09-19 NOTE — Progress Notes (Signed)
[place] Counselor Initial Adult Exam  Name: [name] [name] Date: 09/19/2021 MRN: [id number] DOB: [birth year] PCP: [name] Post, MD  Time spent: 1:03 pm to 1:34 pm; total time: 31 minutes  This session was held via video webex teletherapy due to the coronavirus risk at this time. The patient consented to video teletherapy and was located at his home during this session. He is aware it is the responsibility of the patient to secure confidentiality on his end of the session. The provider was in a private home office for the duration of this session. Limits of confidentiality were discussed with the patient.   Guardian/Payee:  NA    Paperwork requested: No   Reason for Visit /Presenting Problem: persistent depressive disorder  Mental Status Exam: Appearance:   Well Groomed     Behavior:  Appropriate  Motor:  Normal  Speech/Language:   Clear and Coherent  Affect:  Appropriate  Mood:  normal  Thought process:  normal  Thought content:    WNL  Sensory/Perceptual disturbances:    WNL  Orientation:  oriented to person, place, and time/date  Attention:  Good  Concentration:  Good  Memory:  WNL  Fund of knowledge:   Fair  Insight:    Poor  Judgment:   Fair  Impulse Control:  Good     Reported Symptoms:  The patient endorsed experiencing the following: low energy, feeling fatigued, lack of motivation, social isolation, low self-esteem, and feeling sad. He denied suicidal and homicidal ideation.   Risk Assessment: Danger to Self:  No Self-injurious Behavior: No Danger to Others: No Duty to Warn:no Physical Aggression / Violence:No  Access to Firearms a concern: No  Gang Involvement:No  Patient / guardian was educated about steps to take if suicide or homicide risk level increases between visits: n/a While future psychiatric events cannot be accurately predicted, the patient does not currently require acute inpatient psychiatric care and does not currently  meet [facility] involuntary commitment criteria.  Substance Abuse History: Current substance abuse: No     Past Psychiatric History:   Previous psychological history is significant for Persistent depressive disorder Outpatient Providers:NA History of Psych Hospitalization: No  Psychological Testing:  NA    Abuse History:  Victim of: No.,  NA    Report needed: No. Victim of Neglect:No. Perpetrator of  NA   Witness / Exposure to Domestic Violence: No   Protective Services Involvement: No  Witness to [employer] Violence:  No   Family History:  Family History  Problem Relation Age of Onset   Heart disease Mother        cabg   Cancer Mother        breast   Stroke Mother     Living situation: the patient lives with their spouse  Sexual Orientation: Straight  Relationship Status: married  Name of spouse / other: [name] If a parent, number of children / ages:Patient has one daughter  Support Systems: spouse  Museum/gallery curator Stress:  No   Income/Employment/Disability: Actor: No   Educational History: Education: college graduate  Religion/Sprituality/World View: Patient identified as Clinical cytogeneticist  Any cultural differences that may affect / interfere with treatment:  not applicable   Recreation/Hobbies: Hiking  Stressors: Other: procrastination    Strengths: Supportive Relationships  Barriers:  NA   Legal History: Pending legal issue / charges: The patient has no significant history of legal issues. History of legal issue / charges:  NA  Medical History/Surgical  History: reviewed Past Medical History:  Diagnosis Date   Chicken pox    Coronary artery disease    Severe three-vessel coronary artery disease with ejection fraction of 65%   GERD (gastroesophageal reflux disease)    History of gout    History of psoriasis    Hypercholesterolemia    Hypertension    OSA (obstructive sleep apnea)    Moderate with AHI of 16.9/hr now  on CPAP at 10cm H2O   Psoriatic arthritis ([place])     Past Surgical History:  Procedure Laterality Date   ATRIAL FLUTTER ABLATION  03/30/2014   ATRIAL FLUTTER ABLATION N/A 03/30/2014   Procedure: ATRIAL FLUTTER ABLATION;  Surgeon: Coralyn Mark, MD;  Location: [place] CATH LAB;  Service: Cardiovascular;  Laterality: N/A;   CARDIAC CATHETERIZATION  09/2003   Ejection fraction was estimated at 65%.   CARDIOVERSION N/A 03/15/2014   Procedure: CARDIOVERSION;  Surgeon: Lelon Perla, MD;  Location: [facility] ENDOSCOPY;  Service: Cardiovascular;  Laterality: N/A;   CARDIOVERSION N/A 07/17/2017   Procedure: CARDIOVERSION;  Surgeon: Sueanne Margarita, MD;  Location: [facility] ENDOSCOPY;  Service: Cardiovascular;  Laterality: N/A;   CORONARY ARTERY BYPASS GRAFT  09/2003   [place]-lad,svg-diag,svg-om/distal LCX,svg-pda   TEE WITHOUT CARDIOVERSION N/A 03/15/2014   Procedure: TRANSESOPHAGEAL ECHOCARDIOGRAM (TEE);  Surgeon: Lelon Perla, MD;  Location: [facility] ENDOSCOPY;  Service: Cardiovascular;  Laterality: N/A;   TEE WITHOUT CARDIOVERSION N/A 07/17/2017   Procedure: TRANSESOPHAGEAL ECHOCARDIOGRAM (TEE);  Surgeon: Sueanne Margarita, MD;  Location: [facility] ENDOSCOPY;  Service: Cardiovascular;  Laterality: N/A;   TONSILLECTOMY  1950's    Medications: Current Outpatient Medications  Medication Sig Dispense Refill   acetaminophen (TYLENOL) 325 MG tablet Take 2 tablets (650 mg total) by mouth every 6 (six) hours as needed for up to 30 doses for mild pain or moderate pain. 30 tablet 0   atorvastatin (LIPITOR) 80 MG tablet TAKE 1 TABLET BY MOUTH EVERY DAY 90 tablet 1   cholecalciferol (VITAMIN D) 1000 UNITS tablet Take 1,000 Units by mouth daily.     Coenzyme Q10 (CO Q 10 PO) Take 100 mg by mouth daily.      colchicine 0.6 MG tablet TAKE 1 TABLET BY MOUTH TWICE A DAY (Patient taking differently: Take 0.6 mg by mouth daily as needed (gout).) 180 tablet 1   ELIQUIS 5 MG TABS tablet TAKE 1 TABLET BY MOUTH TWICE A DAY 180 tablet 1    furosemide (LASIX) 20 MG tablet Take 1 tablet (20 mg total) by mouth 2 (two) times daily. 180 tablet 3   losartan (COZAAR) 50 MG tablet TAKE 1 TABLET BY MOUTH EVERY DAY 90 tablet 3   methotrexate 2.5 MG tablet Take 2.5 mg by mouth once a week. Caution:Chemotherapy. Protect from light. Take on Tuesdays     metoprolol succinate (TOPROL-XL) 50 MG 24 hr tablet Take 1 tablet (50 mg total) by mouth daily. Take with or immediately following a meal. (Patient taking differently: Take 75 mg by mouth daily. Take with or immediately following a meal.) 90 tablet 3   metoprolol tartrate (LOPRESSOR) 25 MG tablet Take 0.5 tablets (12.5 mg total) by mouth 2 (two) times daily as needed (palpitations/heart racing). 12 tablet 0   multivitamin (THERAGRAN) per tablet Take 1 tablet by mouth daily.     nitroGLYCERIN (NITROSTAT) 0.4 MG SL tablet Place 1 tablet (0.4 mg total) under the tongue every 5 (five) minutes as needed for chest pain. 25 tablet 2   pantoprazole (PROTONIX) 20 MG tablet  Take 20 mg by mouth 2 (two) times daily.     potassium chloride SA (K-DUR,KLOR-CON) 20 MEQ tablet Take 1 tablet (20 mEq total) by mouth daily. 30 tablet 6   tadalafil (CIALIS) 20 MG tablet Take one tablet by mouth every other day as needed for erectile dysfunction 10 tablet 5   tadalafil (CIALIS) 20 MG tablet Take 0.5-1 tablets (10-20 mg total) by mouth every other day as needed for erectile dysfunction. 10 tablet 11   No current facility-administered medications for this visit.    No Known Allergies  Diagnoses:  F34.1 persistent depressive disorder  Plan of Care: The patient is a 71 year old Caucasian male who was referred for counseling due to experiencing procrastination. The patient lives at home with his wife. The patient meets criteria for a diagnosis of F34.1 persistent depressive disorder based off of the following: low energy, feeling fatigued, lack of motivation, social isolation, low self-esteem, and feeling sad. He denied  suicidal and homicidal ideation.   The patient stated that he wants to stop procrastinating. The patient stated that he needs to reflect on his other goals for therapy.  This psychologist makes the recommendation that the patient participate in therapy at least once every two to three weeks to assist him in therapy.   [name], PsyD

## 2021-09-25 ENCOUNTER — Ambulatory Visit (INDEPENDENT_AMBULATORY_CARE_PROVIDER_SITE_OTHER): Payer: Medicare HMO | Admitting: Family Medicine

## 2021-09-25 VITALS — BP 118/60 | HR 65 | Temp 97.9°F | Ht 73.0 in | Wt 203.5 lb

## 2021-09-25 DIAGNOSIS — M109 Gout, unspecified: Secondary | ICD-10-CM | POA: Diagnosis not present

## 2021-09-25 DIAGNOSIS — R197 Diarrhea, unspecified: Secondary | ICD-10-CM | POA: Diagnosis not present

## 2021-09-25 MED ORDER — PREDNISONE 20 MG PO TABS: ORAL_TABLET | ORAL | 0 refills | Status: DC

## 2021-09-25 NOTE — Progress Notes (Signed)
Established Patient Office Visit  Subjective:  Patient ID: [name] [name], male    DOB: May 19, 1951  Age: 71 y.o. MRN: [id number]  CC:  Chief Complaint  Patient presents with   Diarrhea    Patient complains of diarrhea, x1 week    Knee Pain    Patient complains of left knee pain, x2 weeks, Tried Ibuprofen with little relief    HPI [name] [name] presents for the following items  Left knee pain.  Known history of gout.  He has had gout crystals previously with arthrocentesis of knee.  He started having some acute knee pains little over a week ago.  Was seen here last Monday and had uric acid of 7.8 with x-ray showing mild degenerative changes.  He was on some colchicine twice daily but has not seen much improvement with the knee.  Is trying to avoid nonsteroidals.  No fever.  No erythema.  Mild warmth.  Patient had recent ablation procedure at [facility] on February 21.  He was there 1 night.  Last Tuesday he developed some diarrhea.  This was initially watery and up to 8 or so per day and now more loose and has not had any since 9 AM this morning.  He thinks this is slowing down some.  No bloody stools.  No recent travels.  No recent antibiotics.  No associated nausea or vomiting.  No abdominal pain.  Now off the colchicine.  Past Medical History:  Diagnosis Date   Chicken pox    Coronary artery disease    Severe three-vessel coronary artery disease with ejection fraction of 65%   GERD (gastroesophageal reflux disease)    History of gout    History of psoriasis    Hypercholesterolemia    Hypertension    OSA (obstructive sleep apnea)    Moderate with AHI of 16.9/hr now on CPAP at 10cm H2O   Psoriatic arthritis ([place])     Past Surgical History:  Procedure Laterality Date   ATRIAL FLUTTER ABLATION  03/30/2014   ATRIAL FLUTTER ABLATION N/A 03/30/2014   Procedure: ATRIAL FLUTTER ABLATION;  Surgeon: Coralyn Mark, MD;  Location: [place] CATH LAB;  Service: Cardiovascular;  Laterality: N/A;    CARDIAC CATHETERIZATION  09/2003   Ejection fraction was estimated at 65%.   CARDIOVERSION N/A 03/15/2014   Procedure: CARDIOVERSION;  Surgeon: Lelon Perla, MD;  Location: [facility] ENDOSCOPY;  Service: Cardiovascular;  Laterality: N/A;   CARDIOVERSION N/A 07/17/2017   Procedure: CARDIOVERSION;  Surgeon: Sueanne Margarita, MD;  Location: [facility] ENDOSCOPY;  Service: Cardiovascular;  Laterality: N/A;   CORONARY ARTERY BYPASS GRAFT  09/2003   [place]-lad,svg-diag,svg-om/distal LCX,svg-pda   TEE WITHOUT CARDIOVERSION N/A 03/15/2014   Procedure: TRANSESOPHAGEAL ECHOCARDIOGRAM (TEE);  Surgeon: Lelon Perla, MD;  Location: [facility] ENDOSCOPY;  Service: Cardiovascular;  Laterality: N/A;   TEE WITHOUT CARDIOVERSION N/A 07/17/2017   Procedure: TRANSESOPHAGEAL ECHOCARDIOGRAM (TEE);  Surgeon: Sueanne Margarita, MD;  Location: [facility] ENDOSCOPY;  Service: Cardiovascular;  Laterality: N/A;   TONSILLECTOMY  1950's    Family History  Problem Relation Age of Onset   Heart disease Mother        cabg   Cancer Mother        breast   Stroke Mother     Social History   Socioeconomic History   Marital status: Married    Spouse name: Not on file   Number of children: 1   Years of education: 4 years college   Highest education level:  Bachelor's degree (e.g., BA, AB, BS)  Occupational History   Occupation: Print production planner: [employer]    Comment: works part time   Tobacco Use   Smoking status: Former    Packs/day: 0.50    Years: 5.00    Pack years: 2.50    Types: Cigarettes   Smokeless tobacco: Never   Tobacco comments:    "quit smoking cigarettes in the late 1970's"  Vaping Use   Vaping Use: Never used  Substance and Sexual Activity   Alcohol use: Yes    Alcohol/week: 4.0 standard drinks    Types: 2 Cans of beer, 2 Shots of liquor per week    Comment: 1-2  drinks a week   Drug use: No   Sexual activity: Yes  Other Topics Concern   Not on file  Social History Narrative   Working part  time   Married; Bouse of 2   1 child   Social Determinants of Radio broadcast assistant Strain: Low Risk    Difficulty of Paying Living Expenses: Not hard at all  Food Insecurity: No Food Insecurity   Worried About Charity fundraiser in the Last Year: Never true   Arboriculturist in the Last Year: Never true  Transportation Needs: No Transportation Needs   Lack of Transportation (Medical): No   Lack of Transportation (Non-Medical): No  Physical Activity: Sufficiently Active   Days of Exercise per Week: 5 days   Minutes of Exercise per Session: 60 min  Stress: No Stress Concern Present   Feeling of Stress : Not at all  Social Connections: Socially Integrated   Frequency of Communication with Friends and Family: Twice a week   Frequency of Social Gatherings with Friends and Family: Twice a week   Attends Religious Services: More than 4 times per year   Active Member of [employer] or Organizations: Yes   Attends Music therapist: More than 4 times per year   Marital Status: Married  Human resources officer Violence: Not At Risk   Fear of Current or Ex-Partner: No   Emotionally Abused: No   Physically Abused: No   Sexually Abused: No    Outpatient Medications Prior to Visit  Medication Sig Dispense Refill   acetaminophen (TYLENOL) 325 MG tablet Take 2 tablets (650 mg total) by mouth every 6 (six) hours as needed for up to 30 doses for mild pain or moderate pain. 30 tablet 0   atorvastatin (LIPITOR) 80 MG tablet TAKE 1 TABLET BY MOUTH EVERY DAY 90 tablet 1   cholecalciferol (VITAMIN D) 1000 UNITS tablet Take 1,000 Units by mouth daily.     Coenzyme Q10 (CO Q 10 PO) Take 100 mg by mouth daily.      colchicine 0.6 MG tablet TAKE 1 TABLET BY MOUTH TWICE A DAY (Patient taking differently: Take 0.6 mg by mouth daily as needed (gout).) 180 tablet 1   ELIQUIS 5 MG TABS tablet TAKE 1 TABLET BY MOUTH TWICE A DAY 180 tablet 1   furosemide (LASIX) 20 MG tablet Take 1 tablet (20 mg total) by  mouth 2 (two) times daily. 180 tablet 3   losartan (COZAAR) 50 MG tablet TAKE 1 TABLET BY MOUTH EVERY DAY 90 tablet 3   methotrexate 2.5 MG tablet Take 2.5 mg by mouth once a week. Caution:Chemotherapy. Protect from light. Take on Tuesdays     metoprolol tartrate (LOPRESSOR) 25 MG tablet Take 0.5 tablets (12.5 mg total)  by mouth 2 (two) times daily as needed (palpitations/heart racing). 12 tablet 0   multivitamin (THERAGRAN) per tablet Take 1 tablet by mouth daily.     nitroGLYCERIN (NITROSTAT) 0.4 MG SL tablet Place 1 tablet (0.4 mg total) under the tongue every 5 (five) minutes as needed for chest pain. 25 tablet 2   Omeprazole Magnesium (PRILOSEC PO) Take by mouth.     potassium chloride SA (K-DUR,KLOR-CON) 20 MEQ tablet Take 1 tablet (20 mEq total) by mouth daily. 30 tablet 6   tadalafil (CIALIS) 20 MG tablet Take one tablet by mouth every other day as needed for erectile dysfunction 10 tablet 5   metoprolol succinate (TOPROL-XL) 50 MG 24 hr tablet Take 1 tablet (50 mg total) by mouth daily. Take with or immediately following a meal. (Patient taking differently: Take 75 mg by mouth daily. Take with or immediately following a meal.) 90 tablet 3   tadalafil (CIALIS) 20 MG tablet Take 0.5-1 tablets (10-20 mg total) by mouth every other day as needed for erectile dysfunction. 10 tablet 11   pantoprazole (PROTONIX) 20 MG tablet Take 20 mg by mouth 2 (two) times daily.     No facility-administered medications prior to visit.    No Known Allergies  ROS Review of Systems  Constitutional:  Negative for chills and fever.  Respiratory:  Negative for shortness of breath.   Cardiovascular:  Negative for chest pain.  Gastrointestinal:  Positive for diarrhea. Negative for abdominal pain, blood in stool, nausea and vomiting.  Genitourinary:  Negative for dysuria.  Musculoskeletal:  Positive for arthralgias.     Objective:    Physical Exam Vitals reviewed.  Constitutional:      General: He is not  in acute distress.    Appearance: He is not ill-appearing.  Cardiovascular:     Rate and Rhythm: Normal rate.  Pulmonary:     Effort: Pulmonary effort is normal.     Breath sounds: Normal breath sounds.  Abdominal:     General: Bowel sounds are normal.     Palpations: Abdomen is soft.     Tenderness: There is no abdominal tenderness. There is no guarding or rebound.  Musculoskeletal:     Comments: Left knee reveals moderate effusion.  Mild warmth.  Mild diffuse tenderness.  No erythema.  Neurological:     Mental Status: He is alert.    BP 118/60 (BP Location: Left Arm, Patient Position: Sitting, Cuff Size: Normal)    Pulse 65    Temp 97.9 F (36.6 C) (Oral)    Ht '6\' 1"'$  (1.854 m)    Wt 203 lb 8 oz (92.3 kg)    SpO2 97%    BMI 26.85 kg/m  Wt Readings from Last 3 Encounters:  09/25/21 203 lb 8 oz (92.3 kg)  09/18/21 213 lb 9.6 oz (96.9 kg)  07/28/21 207 lb 14.4 oz (94.3 kg)     Health Maintenance Due  Topic Date Due   Zoster Vaccines- Shingrix (1 of 2) Never done   TETANUS/TDAP  07/23/2018    There are no preventive care reminders to display for this patient.  Lab Results  Component Value Date   TSH 0.78 02/28/2021   Lab Results  Component Value Date   WBC 7.1 09/18/2021   HGB 14.0 09/18/2021   HCT 41.2 09/18/2021   MCV 92.3 09/18/2021   PLT 151.0 09/18/2021   Lab Results  Component Value Date   NA 137 09/18/2021   K 3.8 09/18/2021   CO2  28 09/18/2021   GLUCOSE 107 (H) 09/18/2021   BUN 18 09/18/2021   CREATININE 1.00 09/18/2021   BILITOT 1.2 02/28/2021   ALKPHOS 84 02/28/2021   AST 20 02/28/2021   ALT 15 02/28/2021   PROT 6.8 02/28/2021   ALBUMIN 4.5 02/28/2021   CALCIUM 9.6 09/18/2021   ANIONGAP 11 10/19/2020   GFR 74.89 09/18/2021   Lab Results  Component Value Date   CHOL 136 02/28/2021   Lab Results  Component Value Date   HDL 65.60 02/28/2021   Lab Results  Component Value Date   LDLCALC 59 02/28/2021   Lab Results  Component Value Date    TRIG 54.0 02/28/2021   Lab Results  Component Value Date   CHOLHDL 2 02/28/2021   No results found for: HGBA1C    Assessment & Plan:   #1 acute monoarticular arthritis left knee.  History of gout.  Suspect acute gout flare.  Recent diarrhea symptoms and knee pain not improved with colchicine.  -Prednisone 20 mg 2 tablets daily for 5 days.  Patient has no history of diabetes. -Touch base if knee pain not improving with the above  #2 diarrhea.  Suspect viral.  Has not had any red flags such as fever or bloody stools.  Did have recent hospitalization but no antibiotic use.  Doubt C. difficile.  -Handout on appropriate diet given -Follow-up immediately for any fever, bloody stools, or increasing frequency of diarrhea -If diarrhea should worsen again consider stool studies and further labs  Meds ordered this encounter  Medications   predniSONE (DELTASONE) 20 MG tablet    Sig: Take two tablets by mouth once daily for 5 days.    Dispense:  10 tablet    Refill:  0    Follow-up: No follow-ups on file.    [name], MD

## 2021-09-27 NOTE — Progress Notes (Signed)
Cardiology Office Note   Date:  10/02/2021   ID:  [name], [name] 02/05/1951, MRN [id number]  PCP:  [name] Post, MD  Cardiologist:   [name] Turko [place], MD   Chief Complaint  Patient presents with   Atrial Fibrillation   aortic insufficiency       History of Present Illness: [name] [name] is a 71 y.o. male who is seen for follow up Afib, CAD, AI, and CHF. He has a history of  atrial flutter and CAD. He is status [name] CABG in 2005. He had a normal stress echo in May of 2013. In August 2015 he presented with atrial flutter with RVR. He had mildly elevated troponins. He had successful TEE guided DCCV. He then underwent ablation of atrial flutter and ectopic atrial tachycardia on 03/30/14 by Dr. [name].   Stress Myoview September 2015 demonstrated excellent exercise tolerance and normal perfusion. TEE showed normal LV function, mild biatrial enlargement, mild MR,TR,AI. He does have moderate obstructive sleep apnea followed by Dr. Radford [name] and is on CPAP therapy.  He was seen in December 2018 with  an irregular and fast pulse. Was found to be in AFib with RVR. Echo showed EF 40-45% with moderate MR and AI and moderate biatrial enlargement. Toprol dose was increased and he was started on Eliquis. Underwent TEE guided DCCV on 07/17/17. Echo showed EF 40% with moderate biatrial enlargement. Moderate to severe MR, moderate TR, patent foramen ovale. He was successfully cardioverted but had recurrence of Afib on 07/24/17. He was seen in Afib clinic with Dr. [name] for consideration of Afib ablation versus AAD therapy. There was discussion about starting him on Tikosyn. Given new LV dysfunction and valvular disease it was felt that right and left heart cath were indicated prior to initiation of AAD therapy.  He also wore a monitor that showed episodes of wide complex tachycardia and it was unclear whether this was aberrancy versus VT.  He got a  second opinion at [facility] with Dr. Lurene [name]. He was  admitted in February 2019 and had a nuclear stress test showing no ischemia. EF 44%. He was loaded with Tikosyn and DCCV was performed. he had significant QT prolongation and Tikosyn was discontinued. He later underwent Cardiac MRI. He underwent AFib ablation with pulmonary vein isolation on 10/15/17. Echo following ablation showed EF 50-55%.  Both MRI and Echo showed evidence of moderate to severe AI. LV function had returned to normal with restoration of NSR.  In June 2020 he had recurrent Afib and underwent DCCV at [facility]. On follow up he was noted to be bradycardic and metoprolol dose was reduced. In September 2020 he had an Echo at [facility] showing normal LV function and moderate AI. He is followed regularly by Dr [name] at [facility]. Seen there in August. Echo done showing moderate AI. Normal LV function.  He was seen there at [facility] in October. Note reports NSR with PACs but Ecg.  He presented to the ER on November 16, 2020 with fast heart beat: SVT vs fib/flutter.  Adenosine given with short break to 10 beats of VT, then back to SVT with RBBB. DCCV was about to be performed, but he converted on his own when they sat the patient up. He was discharged without admission. He sent monitor strips from 4/28 with Afib in the 170s. BB was increased. Strips were faxed to his [place] cardiologist. Had first occurrence of Afib RVR on March 30 - HR 190s. He converted to NSR. [place]  and broke ribs on April 27th. Three dogs jumped on him and he fell and broke 4 ribs on right side (3-6). No head injury, no LOC. Monitor showed Afib 4/28-5/7 - could be related to his rib injury. On subsequent follow up in the office had no recurrence of Afib.   More recently he had recurrent Afib. He underwent redo AFib and Atrial tachycardia ablation by Dr [name] [name] at [facility] on 09/12/21. Prior to this he had cardiac MRI showing mod-severe AI, mild LV enlargement - really unchanged from 2019.  Echo in Dec 2022 showed normal LV function and moderate AI.   On  follow up today he states he feels well. No chest pain or dyspnea. No palpitations since this last ablation.  Still takes lasix twice a day and swelling is under control. Had a recent gout flair and had a short course of steroids. Is careful with salt intake. No orthopnea or PND. BP has been well controlled. He is still exercising 3-5 days a week at a program at [facility].    Past Medical History:  Diagnosis Date   Chicken pox    Coronary artery disease    Severe three-vessel coronary artery disease with ejection fraction of 65%   GERD (gastroesophageal reflux disease)    History of gout    History of psoriasis    Hypercholesterolemia    Hypertension    OSA (obstructive sleep apnea)    Moderate with AHI of 16.9/hr now on CPAP at 10cm H2O   Psoriatic arthritis ([place])     Past Surgical History:  Procedure Laterality Date   ATRIAL FLUTTER ABLATION  03/30/2014   ATRIAL FLUTTER ABLATION N/A 03/30/2014   Procedure: ATRIAL FLUTTER ABLATION;  Surgeon: Coralyn Mark, MD;  Location: [place] CATH LAB;  Service: Cardiovascular;  Laterality: N/A;   CARDIAC CATHETERIZATION  09/2003   Ejection fraction was estimated at 65%.   CARDIOVERSION N/A 03/15/2014   Procedure: CARDIOVERSION;  Surgeon: Lelon Perla, MD;  Location: [facility] ENDOSCOPY;  Service: Cardiovascular;  Laterality: N/A;   CARDIOVERSION N/A 07/17/2017   Procedure: CARDIOVERSION;  Surgeon: Sueanne Margarita, MD;  Location: [facility] ENDOSCOPY;  Service: Cardiovascular;  Laterality: N/A;   CORONARY ARTERY BYPASS GRAFT  09/2003   [place]-lad,svg-diag,svg-om/distal LCX,svg-pda   TEE WITHOUT CARDIOVERSION N/A 03/15/2014   Procedure: TRANSESOPHAGEAL ECHOCARDIOGRAM (TEE);  Surgeon: Lelon Perla, MD;  Location: [facility] ENDOSCOPY;  Service: Cardiovascular;  Laterality: N/A;   TEE WITHOUT CARDIOVERSION N/A 07/17/2017   Procedure: TRANSESOPHAGEAL ECHOCARDIOGRAM (TEE);  Surgeon: Sueanne Margarita, MD;  Location: [facility] ENDOSCOPY;  Service: Cardiovascular;  Laterality: N/A;    TONSILLECTOMY  1950's     Current Outpatient Medications  Medication Sig Dispense Refill   acetaminophen (TYLENOL) 325 MG tablet Take 2 tablets (650 mg total) by mouth every 6 (six) hours as needed for up to 30 doses for mild pain or moderate pain. 30 tablet 0   atorvastatin (LIPITOR) 80 MG tablet TAKE 1 TABLET BY MOUTH EVERY DAY 90 tablet 1   cholecalciferol (VITAMIN D) 1000 UNITS tablet Take 1,000 Units by mouth daily.     Coenzyme Q10 (CO Q 10 PO) Take 100 mg by mouth daily.      colchicine 0.6 MG tablet TAKE 1 TABLET BY MOUTH TWICE A DAY (Patient taking differently: Take 0.6 mg by mouth daily as needed (gout).) 180 tablet 1   ELIQUIS 5 MG TABS tablet TAKE 1 TABLET BY MOUTH TWICE A DAY 180 tablet 1   furosemide (LASIX) 20 MG  tablet Take 1 tablet (20 mg total) by mouth 2 (two) times daily. 180 tablet 3   losartan (COZAAR) 50 MG tablet TAKE 1 TABLET BY MOUTH EVERY DAY 90 tablet 3   methotrexate 2.5 MG tablet Take 2.5 mg by mouth once a week. Caution:Chemotherapy. Protect from light. Take on Tuesdays     metoprolol succinate (TOPROL-XL) 50 MG 24 hr tablet Take 1 tablet (50 mg total) by mouth daily. Take with or immediately following a meal. (Patient taking differently: Take 75 mg by mouth daily. Take with or immediately following a meal.) 90 tablet 3   metoprolol tartrate (LOPRESSOR) 25 MG tablet Take 0.5 tablets (12.5 mg total) by mouth 2 (two) times daily as needed (palpitations/heart racing). 12 tablet 0   multivitamin (THERAGRAN) per tablet Take 1 tablet by mouth daily.     nitroGLYCERIN (NITROSTAT) 0.4 MG SL tablet Place 1 tablet (0.4 mg total) under the tongue every 5 (five) minutes as needed for chest pain. 25 tablet 2   Omeprazole Magnesium (PRILOSEC PO) Take by mouth.     potassium chloride SA (K-DUR,KLOR-CON) 20 MEQ tablet Take 1 tablet (20 mEq total) by mouth daily. 30 tablet 6   predniSONE (DELTASONE) 20 MG tablet Take two tablets by mouth once daily for 5 days. 10 tablet 0    tadalafil (CIALIS) 20 MG tablet Take one tablet by mouth every other day as needed for erectile dysfunction (Patient not taking: Reported on 10/02/2021) 10 tablet 5   No current facility-administered medications for this visit.    Allergies:   Patient has no known allergies.    Social History:  The patient  reports that he has quit smoking. His smoking use included cigarettes. He has a 2.50 pack-year smoking history. He has never used smokeless tobacco. He reports current alcohol use of about 4.0 standard drinks per week. He reports that he does not use drugs.   Family History:  The patient's family history includes Cancer in his mother; Heart disease in his mother; Stroke in his mother.    ROS:  Please see the history of present illness.   Otherwise, review of systems are positive for none.   All other systems are reviewed and negative.    PHYSICAL EXAM: VS:  BP 108/65    Pulse 71    Ht '6\' 1"'$  (1.854 m)    Wt 212 lb 9.6 oz (96.4 kg)    SpO2 98%    BMI 28.05 kg/m  , BMI Body mass index is 28.05 kg/m.  GENERAL:  Well appearing WM in NAD HEENT:  PERRL, EOMI, sclera are clear. Oropharynx is clear. NECK:  No jugular venous distention, carotid upstroke brisk and symmetric, soft left subclavian bruit, no thyromegaly or adenopathy LUNGS:  Clear to auscultation bilaterally CHEST:  Unremarkable HEART:  RRR with occ. Extrasystole- regular,  PMI not displaced or sustained,S1 and S2 within normal limits, no S3, no S4: no clicks, no rubs, very soft 1/6 diastolic murmur. ABD:  Soft, nontender. BS +, no masses or bruits. No hepatomegaly, no splenomegaly EXT:  2 + pulses throughout, tr-1+ edema, chronic venous stasis changes. SKIN:  Warm and dry.  No rashes NEURO:  Alert and oriented x 3. Cranial nerves II through XII intact. PSYCH:  Cognitively intact   Recent Labs: 10/19/2020: Magnesium 1.8 02/28/2021: ALT 15; TSH 0.78 09/18/2021: BUN 18; Creatinine, Ser 1.00; Hemoglobin 14.0; Platelets 151.0;  Potassium 3.8; Sodium 137  Dated 05/08/17: normal CMET and CBC Dated 09/13/21: normal CBC, CMET, Mg  Lipid Panel    Component Value Date/Time   CHOL 136 02/28/2021 0924   CHOL 163 11/30/2019 0827   TRIG 54.0 02/28/2021 0924   HDL 65.60 02/28/2021 0924   HDL 74 11/30/2019 0827   CHOLHDL 2 02/28/2021 0924   VLDL 10.8 02/28/2021 0924   LDLCALC 59 02/28/2021 0924   LDLCALC 75 11/30/2019 0827     Ecg today shows NSR with PACs. Rate 71. Otherwise normal. I have personally reviewed and interpreted this study.  Wt Readings from Last 3 Encounters:  10/02/21 212 lb 9.6 oz (96.4 kg)  09/25/21 203 lb 8 oz (92.3 kg)  09/18/21 213 lb 9.6 oz (96.9 kg)     Other studies Reviewed: Additional studies/ records that were reviewed today include: none.  ETT 03/06/17: Study Highlights     Blood pressure demonstrated a hypertensive response to exercise. Upsloping ST segment depression ST segment depression of 1 mm was noted during stress in the II, III, aVF, V4 and V5 leads, and returning to baseline after less than 1 minute of recovery.   1. Good exercise tolerance.  2. Upsloping ST depression in inferior leads and V4/V5.  This is nonspecific.    No evidence for ischemia.    Echo 07/08/17: Study Conclusions   - Left ventricle: The cavity size was normal. Wall thickness was   increased in a pattern of moderate LVH. Systolic function was   mildly to moderately reduced. The estimated ejection fraction was   in the range of 40% to 45%. - Aortic valve: There was moderate regurgitation. - Mitral valve: There was moderate regurgitation. - Left atrium: The atrium was moderately dilated. - Right atrium: The atrium was moderately dilated.  TEE 07/17/17: Study Conclusions   - Left ventricle: The estimated ejection fraction was in the range   of 40% to 45%. Mild diffuse hypokinesis with no identifiable   regional variations. No evidence of thrombus. - Aortic valve: There was moderate perivalvular  regurgitation. - Mitral valve: No evidence of vegetation. There was moderate to   severe regurgitation, with multiple jets. Diastolic regurgitation   was absent. - Left atrium: The atrium was moderately dilated. No evidence of   thrombus in the atrial cavity or appendage. No evidence of   thrombus in the atrial cavity or appendage. Emptying velocity was   reduced. - Right atrium: The atrium was moderately dilated. - Atrial septum: There was a medium-sized fenestrated patent   foramen ovale. A in the fossa ovalis region was present. There   was a moderate bidirectional shunt through a patent foramen   ovale, in the baseline state. - Tricuspid valve: No evidence of vegetation. There was moderate   regurgitation. - Pulmonic valve: No evidence of vegetation.   Impressions:   - Successful cardioversion. No cardiac source of emboli was   indentified.  Event monitor 07/25/17: Study Highlights   Atrial fibrillation Ventricular rates are frequently elevated Frequent nonsustained ventricular tachycardia as well as aberrantly conducted afib are note No sustained ventricular arrhythmias No prolonged pauses or AV block   Cardiac MRI 10/14/17: Cardiac MRI and thoracic MRA with and without contrast was performed on a 1.5 T MRI scanner to evaluate myocardial morphology, function, viability and assess pulmonary vein anatomy in a patient with hx of GERD, HTN, hypercholesterolemia, OSA on CPAP, CAD s/p CABG (LIMA to LAD, svg to D1, sequential svg to OM and distal LCx, and svg to PDA), atrial flutter s/p ablation, atrial fibrillation, and wide complex tachycardia thought to due  to SVT with aberrancy.  Transthoracic echo demonstrated an LVEF of 40-45%, normal cavity size, moderate MR, and moderate AR.  Transesophageal echo demonstrated moderate aortic regurgitation, moderate-severe mitral regurgitation, and a PFO.  Nuclear stress demonstrated a mildly dilated LV with no perfusion defect and an EF of  44%.  The patient is scheduled for ablation for atrial fibrillation.  Cardiac MRI 1. The left ventricle is moderately dilated.  Wall thickness is normal.  Regional and global LV systolic function are low normal.  The LVEF is calculated at 59%.  2. The right ventricle is normal in cavity size and wall thickness.  Global RV systolic function is low normal to mildly reduced.  The RVEF is calculated at 50%.  3. Both atria are severely enlarged.  4. The aortic valve is trileaflet in morphology. There is no significant aortic stenosis. There is moderate-severe aortic regurgitation.  The regurgitant orifice area measures 0.26 cm^2.  There is reversal of flow in the proximal descending thoracic aorta.  5.  There is  mild-moderate mitral regurgitation.  There is moderate tricuspid regurgitation.  There is mild pulmonic regurgitation.  6. Delayed enhancement imaging demonstrates no evidence of myocardial infarction, scarring,  or infiltration.  7.  There is no evidence of an intracardiac thrombus.  8.  The pericardium is normal in thickness.  There is no significant pericardial effusion.  Thoracic MRA 1. There are 3 right sided pulmonary veins.  The right lower and right middle pulmonary veins enter the LA immediately adjacent to one another. The right middle vein is small.  The 2 left sided pulmonary veins enter the LA normally.  All veins are patent without significant stenosis proximally.   MRA bi-orthogonal luminal dimensions are listed below: RUPV: 2.0 x 1.8 cm, 76 cm/sec RMPV: 0.6 x 0.5 cm RLPV: 2.0 x 1.7 cm, 60 cm/ sec LUPV: 1.9 x 1.0 cm, 59 cm/ sec LLPV: 1.9 x 0.9 cm, 60 cm/ sec  MRA LA dimensions Head-foot: 7.1 cm Right-left:  6.2 cm Anterior-posterior:  4.3 cm  2. The thoracic aorta is normal in diameter. There is no evidence of a dissection flap.  3. The main and proximal branch pulmonary arteries are normal in size  4. Systemic venous connections are normal.  5.  The esophagus is posterior of the left atrium in close proximity to the ostia of the left-sided pulmonary veins.  The descending thoracic aorta is behind the esophagus and behind the left sided pulmonary Veins.  Echo 10/16/17: INTERPRETATION ---------------------------------------------------------------   NORMAL LEFT VENTRICULAR SYSTOLIC FUNCTION WITH MILD LVH   NORMAL LA PRESSURES WITH DIASTOLIC DYSFUNCTION   NORMAL RIGHT VENTRICULAR SYSTOLIC FUNCTION   VALVULAR REGURGITATION: MODERATE AR, MILD MR, TRIVIAL PR, MILD TR   NO VALVULAR STENOSIS   IRREGULAR RHYTHM   Moderate to severe aortic regurgitation. Mild left ventricular enlargement   suggests color Doppler may underestimate aortic regurgitation severity.   AR VENA CONTRACTA=0.5CM   MILD TO MODERATE TR   NO PRIOR STUDY FOR COMPARISON  Echo 05/02/18: Study Conclusions   - Left ventricle: The cavity size was mildly dilated. Wall   thickness was increased in a pattern of mild LVH. Systolic   function was normal. The estimated ejection fraction was in the   range of 50% to 55%. Wall motion was normal; there were no   regional wall motion abnormalities. Left ventricular diastolic   function parameters were normal. - Aortic valve: There was mild to moderate regurgitation. - Mitral valve: Calcified annulus. There was mild  regurgitation. - Left atrium: The atrium was severely dilated. - Tricuspid valve: There was moderate regurgitation. - Pulmonary arteries: Systolic pressure was mildly increased. PA   peak pressure: 41 mm Hg (S).   Impressions:   - Normal LV systolic function; mild LVE and LVH; mild to moderate   AI; mild MR; severe LAE; moderate TR; mild pulmonary   hypertension.  Echo 03/26/19: INTERPRETATION ---------------------------------------------------------------   NORMAL LEFT VENTRICULAR SYSTOLIC FUNCTION   NORMAL LA PRESSURES WITH DIASTOLIC DYSFUNCTION   NORMAL RIGHT VENTRICULAR SYSTOLIC FUNCTION   VALVULAR  REGURGITATION: MODERATE AR, MILD MR, TRIVIAL PR, MILD TR   NO VALVULAR STENOSIS   AR VENA CONTRACTA=0.6CM   MILD TO MODERATE TR   NO PRIOR STUDY FOR COMPARISON   Echo 04/11/20: ECHOCARDIOGRAPHIC DESCRIPTIONS -----------------------------------------------  AORTIC ROOT          Size: MILDLY DILATED    Dissection: INDETERM FOR DISSECTION   AORTIC VALVE      Leaflets: Tricuspid             Morphology: MILDLY THICKENED      Mobility: Fully Mobile   LEFT VENTRICLE                                      Anterior: Normal          Size: MILDLY ENLARGED                        Lateral: Normal   Contraction: Normal                                  Septal: Normal    Closest EF: >55%(Estimated)  Calc.EF: 56% (3D)      Apical: Normal     LV masses: No Masses                             Inferior: Normal           LVH: MILD LVH                             Posterior: Normal   LV GLS(LOL): -18.0%   LV GLS(AVG): -16.6% Normal Range [ <= -16]  Dias.FxClass: N/A   MITRAL VALVE      Leaflets: Normal                  Mobility: Fully mobile    Morphology: Normal   LEFT ATRIUM          Size: MODERATELY ENLARGED     LA masses: No masses                Normal IAS   MAIN PA          Size: DILATED   PULMONIC VALVE    Morphology: Normal      Mobility: Fully Mobile   RIGHT VENTRICLE          Size: Normal                    Free wall: Normal   Contraction: Normal                    RV masses: No Masses  TAPSE:   2.1 cm,  Normal Range [>= 1.6 cm]       RV Note: ANNULAR VELOCITY=10cm/s   TRICUSPID VALVE      Leaflets: Normal                  Mobility: Fully mobile    Morphology: Normal   RIGHT ATRIUM          Size: MODERATELY ENLARGED        RA Other: None     RA masses: No masses   PERICARDIUM         Fluid: No effusion   INFERIOR VENACAVA          Size: DILATED    Normal respiratory collapse   DOPPLER ECHO and OTHER SPECIAL PROCEDURES ------------------------------------      Aortic: MODERATE AR            No AS      Mitral: MILD MR                No MS     MV Inflow E Vel.= 70.0 cm/s  MV Annulus E'Vel.= 8.5 cm/s  E/E'Ratio= 8   Tricuspid: MILD TR                No TS             2.9 m/s peak TR vel   41 mmHg peak RV pressure   Pulmonary: TRIVIAL PR             No PS       Other:   INTERPRETATION ---------------------------------------------------------------    NORMAL LEFT VENTRICULAR SYSTOLIC FUNCTION WITH MILD LVH    NORMAL RIGHT VENTRICULAR SYSTOLIC FUNCTION    VALVULAR REGURGITATION: MODERATE AR, MILD MR, TRIVIAL PR, MILD TR    NO VALVULAR STENOSIS    MILDLY DILATED LEFT VENTRICLE SUGGESTS POSSIBILITY THAT ECHOCARDIOGRAPHY    UNDERESTIMATES AORTIC REGURGITATION SEVERITY.    AR VENA CONTRACTA=0.6cm    3D acquisition and reconstructions were performed as part of this    examination to more accurately quantify the effects of moderate or greater    valve regurgitation. ([name]-processing on an Independent workstation).     Compared with prior Echo study on 03/26/2019: NO SIGNIFICANT CHANGES.    Event monitor 12/07/20: Study Highlights  Atrial fibrillation with RVR and intermittent aberrancy. Patient had returned to NSR on 12/01/20    Echo 07/04/21: INTERPRETATION ---------------------------------------------------------------    NORMAL LEFT VENTRICULAR SYSTOLIC FUNCTION WITH MILD LVH    NORMAL RIGHT VENTRICULAR SYSTOLIC FUNCTION    VALVULAR REGURGITATION: MODERATE AR, MILD MR, MILD PR, MILD TR    NO VALVULAR STENOSIS    IRREGULAR RHYTHM THROUGHOUT EXAM    AR VENA CONTRACTA = 0.5 cm    3D acquisition and reconstructions were performed as part of this    examination to more accurately quantify the effects of identified    structural abnormalities as part of the exam. ([name]-processing on an    Independent workstation).     Compared with prior Echo study on 04/11/2020: MILD TO MODERATE TR    SEVERE BIATRIAL ENLARGEMENT. INCREASED RV DIMENSIONS    NO  SIGNIFICANT CHANGE IN AR OR LV FUNCTION    Cardiac MRI 09/11/21: SUMMARY   ==========================================================================================================   Cardiac MRI and thoracic MRA were performed  with and without contrast on a 3.0 T MRI scanner to evaluate  myocardial morphology, function, viability and pulmonary vein anatomy in a 71 y/o patient with hx  of GERD,  HTN, hypercholesterolemia, OSA on CPAP, CAD s/p CABG (LIMA to LAD, svg to D1, sequential svg to OM and  distal LCx, and svg to PDA), atrial flutter s/p ablation, PFO, atrial fibrillation, wide complex  tachycardia thought to due to SVT with aberrancy, aortic regurgitation, and mitral regurgitation who is  scheduled for afib ablation.  Comparisons were made to the prior scan from 10/14/2017.   The cardiac rhythm was atrial fibrillation. Real time imaging was used.  Regurgitant valvular lesions could  not be fully assessed due to the use of real time imaging.  Comprehensive assessment could be performed  following conversion to NSR.   Cardiac MRI  1. The left ventricle is moderately dilated.  Wall thickness is normal.  There are no regional wall motion  abnormalities. During atrial fibrillation, there is low normal to mildly reduced function with an LVEF  visually estimated at 50-55%.   2. The right ventricle is normal in cavity size and wall thickness.  Global RV systolic function is low  normal to mildly reduced.   3. Both atria are severely enlarged.   4. The aortic valve is trileaflet in morphology. There is no significant aortic stenosis. There is at least  moderate-severe aortic regurgitation.   5.  There is at least mild-moderate mitral regurgitation.  There is at least moderate tricuspid  regurgitation. There is mild pulmonic regurgitation.   6. Delayed enhancement imaging demonstrates no evidence of myocardial infarction, scarring,  or  infiltration.   7.  There is no evidence of an  intracardiac thrombus.   8.  The pericardium is normal in thickness.  There is no significant pericardial effusion.   Thoracic MRA:  1. There are 3 right sided pulmonary veins.  The right lower and right middle pulmonary veins enter the LA  immediately adjacent to one another. The right middle vein is small.   The 2 left sided pulmonary veins enter the LA normally.   All veins are patent without significant stenosis proximally.   MRA bi-orthogonal luminal dimensions are listed below:  RUPV: 2.1 x 1.9 cm (prior 2.0 x 1.8 cm, 76 cm/sec)  RMPV: 0.7 x 0.6 cm (prior 0.6 x 0.5 cm)  RLPV: 2.1 x 1.7 cm (prior 2.0 x 1.7 cm, 60 cm/ sec)  LUPV: 1.9 x 1.1 cm (prior 1.9 x 1.0 cm, 59 cm/ sec)  LLPV: 1.7 x 1.0 cm (prior 1.9 x 0.9 cm, 60 cm/ sec)   MRA LA dimensions  Head-foot: 6.9 cm  Right-left: 6.8 cm  Anterior-posterior: 4.5 cm   2. The thoracic aorta is normal in diameter. There is no evidence of a dissection flap.   3. The main and proximal branch pulmonary arteries are normal in size   4. Systemic venous connections are normal.   5. The esophagus traverses posterior to ostia of the left-sided pulmonary veins.  The descending thoracic  aorta is behind the esophagus and behind the left sided pulmonary veins.   ASSESSMENT AND PLAN:  1. Atrial fibriillation with RVR. Prior history of atrial flutter and atrial tachycardia ablation in 2015. On anticoagulation with Eliquis 5 mg bid. [place] vasc score of 3. On  Toprol XL.   Failed Tikosyn due to QT prolongation. Now s/p Afib ablation at [facility] on 10/15/17. Recurrent Afib in June 2020 with repeat DCCV. Recurrent Afib recently with repeat Ablation at [facility]. Maintaining NSR   2. Coronary disease status [name] CABG in 2005. He is without chest pain. He had a normal Myoview  study September 2015. Low risk ETT in August 2018. Repeat nuclear  study at [facility] 2019 showed no ischemia. EF 44%.   3. Chronic Aortic insufficiency- moderate to severe.  Pulse pressure  normal. No symptoms. Repeat Echo in recently at [facility] showed no change.MRI stable from 2019.   4. 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   5. Atrial flutter s/p ablation.   6. OSA- now on CPAP. Followed by Dr. Radford [name].  7. Hyperlipidemia on statin. Last labs in August looked great.   8. HTN - BP is under good control  9. Left subclavian stenosis. Asymptomatic.     Disposition: FU with me in 6 months  Signed, Hatsuko Bizzarro [place], MD  10/02/2021 11:35 AM    [place]

## 2021-09-28 DIAGNOSIS — L570 Actinic keratosis: Secondary | ICD-10-CM | POA: Diagnosis not present

## 2021-09-28 DIAGNOSIS — L400 Psoriasis vulgaris: Secondary | ICD-10-CM | POA: Diagnosis not present

## 2021-09-28 DIAGNOSIS — L821 Other seborrheic keratosis: Secondary | ICD-10-CM | POA: Diagnosis not present

## 2021-09-28 DIAGNOSIS — L814 Other melanin hyperpigmentation: Secondary | ICD-10-CM | POA: Diagnosis not present

## 2021-09-28 DIAGNOSIS — D225 Melanocytic nevi of trunk: Secondary | ICD-10-CM | POA: Diagnosis not present

## 2021-09-29 ENCOUNTER — Telehealth: Payer: Self-pay

## 2021-09-29 NOTE — Telephone Encounter (Signed)
-----   Message ----- ?From: Carole Binning, LPN ?Sent: 09/19/2021   2:50 PM EST ?To: Billie Ruddy, MD ?  ?Due to patient's history of Psoriatic Arthritis, please have your office arrange with patient to obtain and send previous rheumatology records for review. We will be unable to complete the review process until the records have been received. Thanks!  ? ? ?LVM instructions for pt to call office for more information needed regarding his referral  request. ?

## 2021-09-29 NOTE — Telephone Encounter (Signed)
Left a message for the pt to return my call.  

## 2021-10-02 ENCOUNTER — Ambulatory Visit: Payer: Medicare HMO | Admitting: Cardiology

## 2021-10-02 ENCOUNTER — Other Ambulatory Visit: Payer: Self-pay

## 2021-10-02 ENCOUNTER — Encounter: Payer: Self-pay | Admitting: Cardiology

## 2021-10-02 VITALS — BP 108/65 | HR 71 | Ht 73.0 in | Wt 212.6 lb

## 2021-10-02 DIAGNOSIS — E7800 Pure hypercholesterolemia, unspecified: Secondary | ICD-10-CM | POA: Diagnosis not present

## 2021-10-02 DIAGNOSIS — I2510 Atherosclerotic heart disease of native coronary artery without angina pectoris: Secondary | ICD-10-CM | POA: Diagnosis not present

## 2021-10-02 DIAGNOSIS — I4891 Unspecified atrial fibrillation: Secondary | ICD-10-CM

## 2021-10-02 DIAGNOSIS — Z951 Presence of aortocoronary bypass graft: Secondary | ICD-10-CM | POA: Diagnosis not present

## 2021-10-02 DIAGNOSIS — I351 Nonrheumatic aortic (valve) insufficiency: Secondary | ICD-10-CM

## 2021-10-02 NOTE — Telephone Encounter (Signed)
I spoke with the pt and he stated he would reach out to previous Rheumatologist in regards to previous records and have them faxed to our office.  ?

## 2021-10-04 ENCOUNTER — Telehealth: Payer: Self-pay | Admitting: *Deleted

## 2021-10-04 NOTE — Telephone Encounter (Signed)
Direct call into the triage phone, Dr Owens Shark is an orthodontist and is asking if patient would need SBE prior to dental procedure. Aware do not see a reason for SBE. ?

## 2021-10-05 DIAGNOSIS — G4733 Obstructive sleep apnea (adult) (pediatric): Secondary | ICD-10-CM | POA: Diagnosis not present

## 2021-10-06 ENCOUNTER — Telehealth: Payer: Self-pay | Admitting: Pharmacist

## 2021-10-06 NOTE — Chronic Care Management (AMB) (Addendum)
? ? ?  Chronic Care Management ?Pharmacy Assistant  ? ?Name: [name] [name]  MRN: [id number] DOB: [birth year] ? ?10/09/2021 APPOINTMENT REMINDER ? ? ?Called [name], No answer, left message of appointment on 10/09/2021 at 1:00 via telephone visit with Jeni Salles, Pharm D. Notified to have all medications, supplements, blood pressure and/or blood sugar logs available during appointment and to return call if need to reschedule. ? ?Patient called back and rescheduled appointment. ? ?Care Gaps: ?AWV - completed 01/30/2021 ?Last BP - 108/65 on 10/02/2021 ?TDAP - overdue  ?Shingrix - never done ?  ?Star Rating Drugs: ?Atorvastatin '80mg'$  - last filled 07/14/2021 90DS at [facility]  ?Losartan '50mg'$  - last filled 02/06/202390 DS at [facility] ? ? ?Any gaps in medications fill history? No ? ?Gennie Alma CMA  ?Clinical Pharmacist Assistant ?[phone] ? ?

## 2021-10-09 ENCOUNTER — Telehealth: Payer: Medicare HMO

## 2021-10-12 ENCOUNTER — Other Ambulatory Visit: Payer: Self-pay

## 2021-10-12 ENCOUNTER — Ambulatory Visit (INDEPENDENT_AMBULATORY_CARE_PROVIDER_SITE_OTHER): Payer: Medicare HMO | Admitting: Psychologist

## 2021-10-12 DIAGNOSIS — F341 Dysthymic disorder: Secondary | ICD-10-CM

## 2021-10-12 DIAGNOSIS — R69 Illness, unspecified: Secondary | ICD-10-CM | POA: Diagnosis not present

## 2021-10-12 NOTE — Progress Notes (Signed)
[place] Counselor/Therapist Progress Note ? ?Patient ID: [name] [name], MRN: [id number],   ? ?Date: 10/12/2021 ? ?Time Spent: 3:03 pm to 3:39 pm; total time: 36 minutes ? ? This session was held via in person. The patient consented to in-person therapy and was in the clinician's office. Limits of confidentiality were discussed with the patient.  ? ?Treatment Type: Individual Therapy ? ?Reported Symptoms: Lack of motivation ? ?Mental Status Exam: ?Appearance:  Well Groomed     ?Behavior: Appropriate  ?Motor: Normal  ?Speech/Language:  Clear and Coherent  ?Affect: Appropriate  ?Mood: normal  ?Thought process: normal  ?Thought content:   WNL  ?Sensory/Perceptual disturbances:   WNL  ?Orientation: oriented to person, place, and time/date  ?Attention: Good  ?Concentration: Good  ?Memory: WNL  ?Fund of knowledge:  Good  ?Insight:   Fair  ?Judgment:  Fair  ?Impulse Control: Good  ? ?Risk Assessment: ?Danger to Self:  No ?Self-injurious Behavior: No ?Danger to Others: No ?Duty to Warn:no ?Physical Aggression / Violence:No  ?Access to Firearms a concern: No  ?Gang Involvement:No  ? ?Subjective: Beginning the session, patient described himself as feeling a little anxious about the session due to his homework assignment. After reviewing the treatment plan, patient spent the session reflecting on his assignment which was to identify goals for therapy. Patient identified working towards motivation. Patient spent the session reflecting on way to become motivated to ride bike. He identified obstacles and how to overcome them. He processed thoughts and emotions related to lack of motivation. He was agreeable to homework and following up. He denied suicidal and homicidal ideation.   ? ?Interventions:  Worked on developing a therapeutic relationship with the patient using active listening and reflective statements. Provided emotional support using empathy and validation. Reviewed the treatment plan with the  patient. Reviewed events since the intake. Normalized and validated expressed thoughts and emotions. Praised patient for completing homework. Used socratic questions to assist the patient gain insight into self. Challenged some of the thoughts expressed. Identified a behavior patient could work towards of getting bike down. Assisted in problem solving. Used metaphors to assist the patient. Provided psychoeducation about reinforcement factors of anxiety and using values to guide behaviors. Processed thoughts and emotions. Assisted in problem solving. Assigned homework. Assessed for suicidal and homicidal ideation.  ? ?Homework: Patient will get bike down by 2:00 on March 29th to go to bike shop. Complete handout on values ? ?Next Session: Review homework and emotional support ? ?Diagnosis: F34.1 persistent depressive disorder  ? ?Plan:  ? ?Client Abilities: Friendly and easy to develop rapport ? ?Client Preferences: ACT and CBT ? ?Client statement of Needs: Work towards motivation ? ?Treatment Level: Outpatient ? ?Goals ?Work through the grieving process and face reality of own death ?Accept emotional support from others around them ?Live life to the fullest, event though time may be limited ?Become as knowledgeable about the medical condition  ?Reduce fear, anxiety about the health condition  ?Accept the illness ?Accept the role of psychological and behavioral factors  ?Stabilize anxiety level wile increasing ability to function ?Learn and implement coping skills that result in a reduction of anxiety  ?Alleviate depressive symptoms ?Recognize, accept, and cope with depressive feelings ?Develop healthy thinking patterns ?Develop healthy interpersonal relationships ? ?Objectives target date for all objectives is 09/19/2022 ?Identify feelings associated with the illness ?Family members share with each other feelings ?Identify the losses or limitations that have been experienced ?Verbalize acceptance of the reality of  the medical condition ?Commit to learning and implement a proactive approach to managing personal stresses ?Verbalize an understanding of the medical condition ?Work with therapist to develop a plan for coping with stress ?Learn and implement skills for managing stress ?Engage in social, productive activities that are possible ?Engage in faith based activities ?implement positive imagery ?Identify coping skills and sources of emotional support ?Patient's partner and family members verbalize their fears regarding severity of health condition ?Identify sources of emotional distress  ?Learning and implement calming skills to reduce overall anxiety ?Learn and implement problem solving strategies ?Identify and engage in pleasant activities ?Learning and implement personal and interpersonal skills to reduce anxiety and improve interpersonal relationships ?Learn to accept limitations in life and commit to tolerating, rather than avoiding, unpleasant emotions while accomplishing meaningful goals ?Identify major life conflicts from the past and present that form the basis for present anxiety ?Learn and implement behavioral strategies ?Verbalize an understanding and resolution of current interpersonal problems ?Learn and implement problem solving and decision making skills ?Learn and implement conflict resolution skills to resolve interpersonal problems ?Verbalize an understanding of healthy and unhealthy emotions verbalize insight into how past relationships may be influence current experiences with depression ?Use mindfulness and acceptance strategies and increase value based behavior  ?Increase hopeful statements about the future.  ? ?Interventions ?Teach about stress and ways to handle stress ?Assist the patient in developing a coping action plan for stressors ?Conduct skills based training for coping strategies ?Train problem focused skills ?Sort out what activities the individual can do ?Encourage patient to rely upon  his/her spiritual faith ?Teach the patient to use guided imagery ?Probe and evaluate family's ability to provide emotional support ?Allow family to share their fears ?Assist the patient in identifying, sorting through, and verbalizing the various feelings generated by his/her medical condition ?Meet with family members  ?Ask patient list out limitations  ?Use stress inoculation training  ?Use Acceptance and Commitment Therapy to help client accept uncomfortable realities in order to accomplish value-consistent goals ?Reinforce the client's insight into the role of his/her past emotional pain and present anxiety  ?Discuss examples demonstrating that unrealistic worry overestimates the probability of threats and underestimate patient's ability  ?Assist the patient in analyzing his or her worries ?Help patient understand that avoidance is reinforcing  ?Behavioral activation help the client explore the relationship, nature of the dispute,  ?Help the client develop new interpersonal skills and relationships ?Conduct Problem so living therapy ?Teach conflict resolution skills ?Use a process-experiential approach ?Conduct TLDP ?Conduct ACT ? ?The patient and clinician reviewed the treatment plan on 10/12/2021. The patient approved of the treatment plan.  ? ? ?[name], PsyD ? ? ? ?

## 2021-10-12 NOTE — Progress Notes (Signed)
                Vinicius Brockman, PsyD 

## 2021-10-15 ENCOUNTER — Other Ambulatory Visit: Payer: Self-pay | Admitting: Cardiology

## 2021-11-02 ENCOUNTER — Other Ambulatory Visit: Payer: Self-pay | Admitting: Cardiology

## 2021-11-02 NOTE — Telephone Encounter (Signed)
Prescription refill request for Eliquis received. ?Indication:Afib ?Last office visit:3/23 ?Scr:1.0 ?Age: 71 ?Weight:96.4 kg ? ?Prescription refilled ? ?

## 2021-11-05 DIAGNOSIS — G4733 Obstructive sleep apnea (adult) (pediatric): Secondary | ICD-10-CM | POA: Diagnosis not present

## 2021-11-06 ENCOUNTER — Ambulatory Visit (INDEPENDENT_AMBULATORY_CARE_PROVIDER_SITE_OTHER): Payer: Medicare HMO | Admitting: Psychologist

## 2021-11-06 DIAGNOSIS — F341 Dysthymic disorder: Secondary | ICD-10-CM | POA: Diagnosis not present

## 2021-11-06 DIAGNOSIS — R69 Illness, unspecified: Secondary | ICD-10-CM | POA: Diagnosis not present

## 2021-11-06 NOTE — Progress Notes (Signed)
[place] Counselor/Therapist Progress Note ? ?Patient ID: [name] [name], MRN: [id number],   ? ?Date: 11/06/2021 ? ?Time Spent: 9:06 am to 9:44 am; total time: 38 minutes ? ? This session was held via in person. The patient consented to in-person therapy and was in the clinician's office. Limits of confidentiality were discussed with the patient.  ? ?Treatment Type: Individual Therapy ? ?Reported Symptoms: Improved motivation ? ?Mental Status Exam: ?Appearance:  Well Groomed     ?Behavior: Appropriate  ?Motor: Normal  ?Speech/Language:  Clear and Coherent  ?Affect: Appropriate  ?Mood: normal  ?Thought process: normal  ?Thought content:   WNL  ?Sensory/Perceptual disturbances:   WNL  ?Orientation: oriented to person, place, and time/date  ?Attention: Good  ?Concentration: Good  ?Memory: WNL  ?Fund of knowledge:  Good  ?Insight:   Fair  ?Judgment:  Fair  ?Impulse Control: Good  ? ?Risk Assessment: ?Danger to Self:  No ?Self-injurious Behavior: No ?Danger to Others: No ?Duty to Warn:no ?Physical Aggression / Violence:No  ?Access to Firearms a concern: No  ?Gang Involvement:No  ? ?Subjective: Beginning the session, patient described himself as doing well and indicated that he had accomplished his goal. He processed thoughts and emotions related to accomplishing that goal. From there, patient identified two additional SMART goals and processed how to accomplish them. He then spent the last few minutes exploring and processing the barriers to accountability. Several themes were identified and processed related to accountability. He was agreeable to homework and following up. He denied suicidal and homicidal ideation.   ? ?Interventions:  Worked on developing a therapeutic relationship with the patient using active listening and reflective statements. Provided emotional support using empathy and validation. Used summary statements. Praised patient for doing better and accomplishing homework. Explored what  assisted the patient in completing the homework. Identified goals for the session. Provided psychoeducation about SMART goals. Identified two SMART goals to work towards. Identified the theme of accountability. Used socratic questions to assist the patient gain insight into self. Normalized and validated some of the thoughts and emotions associated with accountability. Identified several themes related to accountability and normalized those themes. Briefly explored some of the barriers to accountability. Processed thoughts and emotions. Assisted in problem solving. Provided empathic statements. Assigned homework. Assessed for suicidal and homicidal ideation.  ? ?Homework: Patient will complete SMART goal of cleaning the lawn by removing leaves from bed of flowers; SMART goal of planning trip to the mountains, and complete handout on values ? ?Next Session: Review homework, explore barriers to accountability, and emotional support ? ?Diagnosis: F34.1 persistent depressive disorder  ? ?Plan:  ? ?Client Abilities: Friendly and easy to develop rapport ? ?Client Preferences: ACT and CBT ? ?Client statement of Needs: Work towards motivation ? ?Treatment Level: Outpatient ? ?Goals ?Work through the grieving process and face reality of own death ?Accept emotional support from others around them ?Live life to the fullest, event though time may be limited ?Become as knowledgeable about the medical condition  ?Reduce fear, anxiety about the health condition  ?Accept the illness ?Accept the role of psychological and behavioral factors  ?Stabilize anxiety level wile increasing ability to function ?Learn and implement coping skills that result in a reduction of anxiety  ?Alleviate depressive symptoms ?Recognize, accept, and cope with depressive feelings ?Develop healthy thinking patterns ?Develop healthy interpersonal relationships ? ?Objectives target date for all objectives is 09/19/2022 ?Identify feelings associated with the  illness ?Family members share with each other feelings ?Identify the  losses or limitations that have been experienced ?Verbalize acceptance of the reality of the medical condition ?Commit to learning and implement a proactive approach to managing personal stresses ?Verbalize an understanding of the medical condition ?Work with therapist to develop a plan for coping with stress ?Learn and implement skills for managing stress ?Engage in social, productive activities that are possible ?Engage in faith based activities ?implement positive imagery ?Identify coping skills and sources of emotional support ?Patient's partner and family members verbalize their fears regarding severity of health condition ?Identify sources of emotional distress  ?Learning and implement calming skills to reduce overall anxiety ?Learn and implement problem solving strategies ?Identify and engage in pleasant activities ?Learning and implement personal and interpersonal skills to reduce anxiety and improve interpersonal relationships ?Learn to accept limitations in life and commit to tolerating, rather than avoiding, unpleasant emotions while accomplishing meaningful goals ?Identify major life conflicts from the past and present that form the basis for present anxiety ?Learn and implement behavioral strategies ?Verbalize an understanding and resolution of current interpersonal problems ?Learn and implement problem solving and decision making skills ?Learn and implement conflict resolution skills to resolve interpersonal problems ?Verbalize an understanding of healthy and unhealthy emotions verbalize insight into how past relationships may be influence current experiences with depression ?Use mindfulness and acceptance strategies and increase value based behavior  ?Increase hopeful statements about the future.  ? ?Interventions ?Teach about stress and ways to handle stress ?Assist the patient in developing a coping action plan for  stressors ?Conduct skills based training for coping strategies ?Train problem focused skills ?Sort out what activities the individual can do ?Encourage patient to rely upon his/her spiritual faith ?Teach the patient to use guided imagery ?Probe and evaluate family's ability to provide emotional support ?Allow family to share their fears ?Assist the patient in identifying, sorting through, and verbalizing the various feelings generated by his/her medical condition ?Meet with family members  ?Ask patient list out limitations  ?Use stress inoculation training  ?Use Acceptance and Commitment Therapy to help client accept uncomfortable realities in order to accomplish value-consistent goals ?Reinforce the client's insight into the role of his/her past emotional pain and present anxiety  ?Discuss examples demonstrating that unrealistic worry overestimates the probability of threats and underestimate patient's ability  ?Assist the patient in analyzing his or her worries ?Help patient understand that avoidance is reinforcing  ?Behavioral activation help the client explore the relationship, nature of the dispute,  ?Help the client develop new interpersonal skills and relationships ?Conduct Problem so living therapy ?Teach conflict resolution skills ?Use a process-experiential approach ?Conduct TLDP ?Conduct ACT ? ?The patient and clinician reviewed the treatment plan on 10/12/2021. The patient approved of the treatment plan.  ? ? ?[name], PsyD ? ? ?

## 2021-11-17 ENCOUNTER — Encounter: Payer: Self-pay | Admitting: Family Medicine

## 2021-11-17 DIAGNOSIS — R251 Tremor, unspecified: Secondary | ICD-10-CM

## 2021-11-21 ENCOUNTER — Encounter: Payer: Self-pay | Admitting: Neurology

## 2021-11-25 ENCOUNTER — Other Ambulatory Visit: Payer: Self-pay | Admitting: Cardiology

## 2021-11-28 NOTE — Progress Notes (Signed)
? ?Assessment/Plan:  ? ? Parkinsonism, very early and very mild ? -We discussed the diagnosis as well as pathophysiology of the disease.  We discussed treatment options as well as prognostic indicators.  Patient education was provided. ? -We discussed medications, but ultimately his symptoms really are so mild that both of [place] decided that it may be best to hold off on that for right now. ? -Discussed diagnostics (not necessarily recommended).  Discussed DaTscan, alpha-synuclein skin biopsies.  Offered second opinions.  Ultimately, patient did not feel that he needed this and declined. ? -We discussed the importance of safe, cardiovascular exercise.  We discussed biking goals and recommendations. ? -We discussed community resources in the area including patient support groups and community exercise programs for PD and pt education was provided to the patient.  He met with my LCSW today. ? ? ?Subjective:  ? ?[name] [name] was seen today in the movement disorders clinic for neurologic consultation at the request of Burchette, Alinda Sierras, MD.  The consultation is for the evaluation of tremor.  Records from his January visit from his primary care are reviewed.   ? ?Tremor: Yes.    ? How long has it been going on? 1 year ? At rest or with activation?  rest ? When is it noted the most?  Sitting at the breakfast table (not using it) ? Fam hx of tremor?  Yes.   mother had tremor ? Located where?  L >R hand; no tremor in the L leg that he knows of ? Affected by caffeine: doesn't drink any caffeine ? Affected by alcohol:  unknown (drinks 3 times per week, 2-3 drinks each time) ? Affected by stress:  No. ? Affected by fatigue:  No. ? Spills soup if on spoon:  No. ? Spills glass of liquid if full:  No. ? Tremor inducing meds:  No. ? Tremor improving medications: On Toprol-XL, 50 mg daily ? ?Other Specific Symptoms:  ?Voice: no change ?Sleep: sleeps well ? Vivid Dreams:  No. ? Acting out dreams:  No. ?Wet Pillows: No. ?Postural  symptoms:  No. ? Falls?  No. ?Bradykinesia symptoms: difficulty getting out of a chair (if really soft); doesn't think that speed has changed; no shuffle ?Loss of smell:  No. ?Loss of taste:  No. ?Urinary Incontinence:  No. ?Difficulty Swallowing:  No. ?Handwriting, micrographia: No. ?Trouble with ADL's:  No. ? Trouble buttoning clothing: No. ?Depression:  No. ?Memory changes:  No. ?N/V:  No. ?Lightheaded:  No. ? Syncope: No. ?Diplopia:  No. ?Dyskinesia:  No. ? ?Neuroimaging of the brain has not previously been performed.  ? ?PREVIOUS MEDICATIONS: none to date ? ?ALLERGIES:  No Known Allergies ? ?CURRENT MEDICATIONS:  ?Current Outpatient Medications  ?Medication Instructions  ? acetaminophen (TYLENOL) 650 mg, Oral, Every 6 hours PRN  ? atorvastatin (LIPITOR) 80 MG tablet TAKE 1 TABLET BY MOUTH EVERY DAY  ? cholecalciferol (VITAMIN D) 1,000 Units, Oral, Daily,    ? Coenzyme Q10 (CO Q 10 PO) 100 mg, Daily  ? colchicine 0.6 MG tablet TAKE 1 TABLET BY MOUTH TWICE A DAY  ? ELIQUIS 5 MG TABS tablet TAKE 1 TABLET BY MOUTH TWICE A DAY  ? furosemide (LASIX) 20 MG tablet TAKE 1 TABLET BY MOUTH TWICE A DAY  ? losartan (COZAAR) 50 MG tablet TAKE 1 TABLET BY MOUTH EVERY DAY  ? methotrexate 2.5 mg, Oral, Weekly, Caution:Chemotherapy. Protect from light. Take on Tuesdays  ? metoprolol succinate (TOPROL-XL) 50 mg, Oral, Daily, Take with  or immediately following a meal.  ? multivitamin (THERAGRAN) per tablet 1 tablet, Oral, Daily,    ? nitroGLYCERIN (NITROSTAT) 0.4 mg, Sublingual, Every 5 min PRN  ? potassium chloride SA (K-DUR,KLOR-CON) 20 MEQ tablet 20 mEq, Oral, Daily  ? ? ?Objective:  ? ?PHYSICAL EXAMINATION:   ? ?VITALS:   ?Vitals:  ? 11/30/21 0944  ?BP: 121/69  ?Pulse: 71  ?SpO2: 97%  ?Weight: 210 lb 9.6 oz (95.5 kg)  ?Height: '6\' 1"'  (1.854 m)  ? ? ?GEN:  The patient appears stated age and is in NAD. ?HEENT:  Normocephalic, atraumatic.  The mucous membranes are moist. The superficial temporal arteries are without ropiness or  tenderness. ?CV:  RRR ?Lungs:  CTAB ?Neck/HEME:  There are no carotid bruits bilaterally. ? ?Neurological examination: ? ?Orientation: The patient is alert and oriented x3.  ?Cranial nerves: There is good facial symmetry.  Extraocular muscles are intact. The visual fields are full to confrontational testing. The speech is fluent and clear. Soft palate rises symmetrically and there is no tongue deviation. Hearing is intact to conversational tone. ?Sensation: Sensation is intact to light touch throughout (facial, trunk, extremities). Vibration is intact at the bilateral big toe. There is no extinction with double simultaneous stimulation.  ?Motor: Strength is 5/5 in the bilateral upper and lower extremities.   Shoulder shrug is equal and symmetric.  There is no pronator drift. ?Deep tendon reflexes: Deep tendon reflexes are 2/4 at the bilateral biceps, triceps, brachioradialis, patella and achilles. Plantar responses are downgoing bilaterally. ? ?Movement examination: ?Tone: There is slight increased tone in the right upper extremity, but tone elsewhere is normal.  The tone in the lower extremities is good.  ?Abnormal movements: there is bilateral UE and LE rest tremor, increasing with distraction only (not even present without distraction).  There is only LUE rest tremor with ambulation ?Coordination:  There is  decremation with RAM's, only with toe taps on the L ?Gait and Station: The patient has no difficulty arising out of a deep-seated chair without the use of the hands. The patient's stride length is good with LUE rest tremor.   ?I have reviewed and interpreted the following labs independently ?  Chemistry   ?   ?Component Value Date/Time  ? NA 137 09/18/2021 1429  ? NA 141 09/13/2021 0000  ? K 3.8 09/18/2021 1429  ? CL 104 09/18/2021 1429  ? CO2 28 09/18/2021 1429  ? BUN 18 09/18/2021 1429  ? BUN 8 09/13/2021 0000  ? CREATININE 1.00 09/18/2021 1429  ? CREATININE 0.79 02/26/2020 0916  ? GLU 102 09/13/2021 0000   ?    ?Component Value Date/Time  ? CALCIUM 9.6 09/18/2021 1429  ? ALKPHOS 84 02/28/2021 0924  ? AST 20 02/28/2021 0924  ? ALT 15 02/28/2021 0924  ? BILITOT 1.2 02/28/2021 0924  ? BILITOT 1.2 11/30/2019 0827  ?  ? ? ?Lab Results  ?Component Value Date  ? TSH 0.78 02/28/2021  ? ?Lab Results  ?Component Value Date  ? WBC 7.1 09/18/2021  ? HGB 14.0 09/18/2021  ? HCT 41.2 09/18/2021  ? MCV 92.3 09/18/2021  ? PLT 151.0 09/18/2021  ? ? ? ? ?Total time spent on today's visit was 48 minutes, including both face-to-face time and nonface-to-face time.  Time included that spent on review of records (prior notes available to me/labs/imaging if pertinent), discussing treatment and goals, answering patient's questions and coordinating care. ? ?Cc:  [name], MD ? ?

## 2021-11-30 ENCOUNTER — Encounter: Payer: Self-pay | Admitting: Neurology

## 2021-11-30 ENCOUNTER — Ambulatory Visit: Payer: Medicare HMO | Admitting: Neurology

## 2021-11-30 VITALS — BP 121/69 | HR 71 | Ht 73.0 in | Wt 210.6 lb

## 2021-11-30 DIAGNOSIS — G20 Parkinson's disease: Secondary | ICD-10-CM

## 2021-11-30 NOTE — Patient Instructions (Signed)
Local and Online Resources for Power over Parkinson's Group ?May 2023 ? ?LOCAL [place] PARKINSON'S GROUPS  ?Power over Parkinson's Group:   ?Power Over Parkinson's Patient Education Group will be Wednesday, May 10th-*Hybrid meting*- in person at [facility] location and via [facility] at 2:00 pm.   ?Upcoming Power over Parkinson's Meetings:  2nd Wednesdays of the month at 2 pm:  May 10th, June 14th, July 12th ?Contact Amy Marriott at [email or web address] if interested in participating in this group ?Parkinson's Care Partners Group:    3rd Mondays, Contact Misty Paladino ?Atypical Parkinsonian Patient Group:   4th Wednesdays, Contact Misty Paladino ?If you are interested in participating in these groups with Misty, please contact her directly for how to join those meetings.  Her contact information is [email or web address].   ? ?LOCAL EVENTS AND NEW OFFERINGS ?Moving Day [place]:  Saturday, May 6th, 9:30 am at [place], [place], [place]. Participate in Moving Day as a way to ?honor loved ones, raise funds, fight Parkinson's disease, and celebrate movement.?  Register today at [email or web address] ?[place]!  Play [place]!  Join [place] for home game for a fun evening to bring awareness of Parkinson's and raise funds for our Movement Disorder Funds. Rescheduled to May 11th  6:30 pm [place]. To purchase tickets:  [email or web address] ?Parkinson's T-shirts for sale!  Designed by a local group member, with funds going to [place].  $25.00  Contact Misty to purchase  ?New PWR! Moves [employer] Instructor-Led Class offering at [employer]!  Wednesdays 1-2 pm, starting April 12th.   Contact Bryson Dames, Acupuncturist at [employer].  [email or web address] ? ?ONLINE EDUCATION AND SUPPORT ?[place]:  www.[email or web address] ?PD Health at Home continues:  Mindfulness  Mondays, Wellness Wednesdays, Fitness Fridays  ?Upcoming Education:  ?Understanding Gene and Cell-Based Therapies in Parkinson's.  Wednesday, May 10th at 1:00 pm ?Additional Education offerings virtually through their website-upcoming topics include Palliative Care/Hospice and PD, Sleep and PD ?Register for expert briefings Cytogeneticist) at [email or web address] ?Please check out their website to sign up for emails and see their full online offerings ? ? ?[place]:  [email or web address]  ?Third Thursday Webinars:  On the third Thursday of every month at 12 p.m. ET, join our free live webinars to learn about various aspects of living with Parkinson's disease and our work to speed medical breakthroughs. ?Upcoming Webinar: Get Moving: Exercising for a Healthy Brain.  Thursday, May 18th  at  12 noon. ?Check out additional information on their website to see their full online offerings ? ?[place]:  [email or web address] ?Upcoming Webinar:   Stay tuned ?Webinar Series:  Living with Parkinson's Meetup.   Third Thursdays each month, 3 pm ?Care Partner Monthly Meetup.  With Robin Searing Phinney.  First Tuesday of each month, 2 pm ?Check out additional information to Live Well Today on their website ? ?Parkinson and Movement Disorders (PMD) Alliance:  www.pmdalliance.org ?[employer] Online:  Online Education Events ?Sign up for emails, which are sent weekly to give you updates on programming and online offerings ? ?Parkinson's Association of [place]:  www.[email or web address] ?Information on online support groups, education events, and online exercises including Yoga, Parkinson's exercises and more-LOTS of information on links to PD resources and online events ?Virtual Support Group through [employer] of the [place]; next one is scheduled for Wednesday, May 3rd at 2 pm. (These are typically  scheduled for the 1st Wednesday of the month at 2 pm).  Visit website for details. ?MOVEMENT AND  EXERCISE OPPORTUNITIES ?Parkinson's DRUMMING Classes/Music Therapy with Doylene Canning:  This is a returning class and it's FREE!  2nd Mondays, continuing May 8th, 11:00 at the [place].  Contact *Misty Taylor-Paladino at [email or web address] or Doylene Canning at [phone] or [email or web address]  ?PWR! Moves Classes at [place].  Wednesdays 10 and 11 am.   Contact Amy Marriott, PT [email or web address] if interested. ?NEW PWR! Moves Class offering at [employer].  Wednesdays 1-2 pm, starting April 12th.  Contact Bryson Dames, Acupuncturist at [employer].  [email or web address] ?Here is a link to the PWR!Moves classes on Zoom from [place] - Daily Mon-Sat at 10:00. Via Zoom, FREE and open to all.  There is also a link below via Facebook if you use that platform. ? ?[email or web address].si ?https://www.[email or web address].no ? ?Parkinson's Wellness Recovery (PWR! Moves)  www.pwr4life.org ?Info on the PWR! Virtual Experience:  You will have access to our expertise through self-assessment, guided plans that start with the PD-specific fundamentals, educational content, tips, Q&A with an expert, and a growing Art therapist of PD-specific pre-recorded and live exercise classes of varying types and intensity - both physical and cognitive! If that is not enough, we offer 1:1 wellness consultations (in-person or virtual) to personalize your PWR! Research scientist (medical).  ?[employer] Fridays:  ?As part of the PD Health @ Home program, this free video series focuses each week on one aspect of fitness designed to support people living with  Parkinson's.  These weekly videos highlight the [place] recent fitness guidelines for people with Parkinson's disease. ?www.KVTVnet.com.cy ?Dance for PD website is offering free, live-stream classes throughout the week, as well as links to [employer] of classes:  https://danceforparkinsons.org/ ?Virtual dance and Pilates for Parkinson's classes: Click on the Community Tab> Parkinson's Movement Initiative Tab.  To register for classes and for more information, visit www.[email or web address].co.za and click the ?community? tab.  ?[employer] Parkinson's Cycling Classes  ?[employer]:  Thursdays @ Noon-Live classes at [employer] ([employer] at [email or web address] or [phone]) ?[employer]: Virtual Classes Mondays and Thursdays Jeanette Caprice classes Tuesday, Wednesday and Thursday (contact [place] at [email or web address]  or [phone]) ?[employer] ?Varied levels of classes are offered Mondays, Tuesdays and Thursdays at [employer].  ?Stretching with Verdis Frederickson weekly class is also offered for people with Parkinson's ?To observe a class or for more information, call [phone] or email Hezzie Bump at [email or web address] ?ADDITIONAL SUPPORT AND RESOURCES ?[employer]:Online Caregiver Education Opportunities:  www.well-springsolutions.org/caregiver-education/caregiver-support-group.  You may also contact Vickki Muff at [email or web address] or [phone].    ?Well-Spring Navigator:  Just1Navigator program, a free service to help individuals and families through the journey of determining care for older adults.  The ?Navigator? is a [phone], Education officer, museum, who will speak with a prospective client and/or loved ones to provide an assessment of the situation and a set of recommendations for a personalized care plan -- all free of charge, and whether Well[employer]Spring [employer]  offers the needed service or not. If the need is not a service we provide, we are well-connected with reputable programs in town that we can refer you to.  www.well-springsolutions.org or to speak with the Navigator,

## 2021-12-13 ENCOUNTER — Ambulatory Visit (INDEPENDENT_AMBULATORY_CARE_PROVIDER_SITE_OTHER): Payer: Medicare HMO | Admitting: Psychologist

## 2021-12-13 DIAGNOSIS — R69 Illness, unspecified: Secondary | ICD-10-CM | POA: Diagnosis not present

## 2021-12-13 DIAGNOSIS — F341 Dysthymic disorder: Secondary | ICD-10-CM

## 2021-12-13 NOTE — Progress Notes (Signed)
[place] Counselor/Therapist Progress Note  Patient ID: [name] [name], MRN: [id number],    Date: 12/13/2021  Time Spent: 1:02 pm to 1:50 pm; total time: 48 minutes   This session was held via in person. The patient consented to in-person therapy and was in the clinician's office. Limits of confidentiality were discussed with the patient.   Treatment Type: Individual Therapy  Reported Symptoms: New medical diagnosis feeling sad about  Mental Status Exam: Appearance:  Well Groomed     Behavior: Appropriate  Motor: Normal  Speech/Language:  Clear and Coherent  Affect: Appropriate  Mood: normal  Thought process: normal  Thought content:   WNL  Sensory/Perceptual disturbances:   WNL  Orientation: oriented to person, place, and time/date  Attention: Good  Concentration: Good  Memory: WNL  Fund of knowledge:  Good  Insight:   Fair  Judgment:  Fair  Impulse Control: Good   Risk Assessment: Danger to Self:  No Self-injurious Behavior: No Danger to Others: No Duty to Warn:no Physical Aggression / Violence:No  Access to Firearms a concern: No  Gang Involvement:No   Subjective: Beginning the session, patient described himself as doing okay and indicated that he had completed his homework. From there, he disclosed that he was recently diagnosis with Parkinson's disease. Patient spent the session processing thoughts and emotions associated with this idea. He reflected on where he could get support from others. He also talked about not wanting the disease to become part of his identity. He asked to follow up. He denied suicidal and homicidal ideation.    Interventions:  Worked on developing a therapeutic relationship with the patient using active listening and reflective statements. Provided emotional support using empathy and validation. Praised the patient for doing well and explored what has assisted the patient. Normalized and validated expressed thoughts and  emotions. Reviewed the completed homework. Used socratic questions to assist the patient gain insight into self. Used a Product/process development scientist to assist the patient. Reflected on emotions and thoughts associated with the new diagnosis. Normalized and validated expressed thoughts. Processed what type of activities patient still wants to do. Explored ways that patient can still be active. Processed the idea of prioritizing activities with the new diagnosis. Provided empathic statements. Assessed for suicidal and homicidal ideation.   Homework: Patient will work on prioritizing activities he wants to do.   Next Session: Review homework, and emotional support  Diagnosis: F34.1 persistent depressive disorder   Plan:   Client Abilities: Friendly and easy to develop rapport  Client Preferences: ACT and CBT  Client statement of Needs: Work towards motivation  Treatment Level: Outpatient  Goals Work through the grieving process and face reality of own death Accept emotional support from others around them Live life to the fullest, event though time may be limited Become as knowledgeable about the medical condition  Reduce fear, anxiety about the health condition  Accept the illness Accept the role of psychological and behavioral factors  Stabilize anxiety level wile increasing ability to function Learn and implement coping skills that result in a reduction of anxiety  Alleviate depressive symptoms Recognize, accept, and cope with depressive feelings Develop healthy thinking patterns Develop healthy interpersonal relationships  Objectives target date for all objectives is 09/19/2022 Identify feelings associated with the illness Family members share with each other feelings Identify the losses or limitations that have been experienced Verbalize acceptance of the reality of the medical condition Commit to learning and implement a proactive approach to managing personal stresses Verbalize  an understanding  of the medical condition Work with therapist to develop a plan for coping with stress Learn and implement skills for managing stress Engage in social, productive activities that are possible Engage in faith based activities implement positive imagery Identify coping skills and sources of emotional support Patient's partner and family members verbalize their fears regarding severity of health condition Identify sources of emotional distress  Learning and implement calming skills to reduce overall anxiety Learn and implement problem solving strategies Identify and engage in pleasant activities Learning and implement personal and interpersonal skills to reduce anxiety and improve interpersonal relationships Learn to accept limitations in life and commit to tolerating, rather than avoiding, unpleasant emotions while accomplishing meaningful goals Identify major life conflicts from the past and present that form the basis for present anxiety Learn and implement behavioral strategies Verbalize an understanding and resolution of current interpersonal problems Learn and implement problem solving and decision making skills Learn and implement conflict resolution skills to resolve interpersonal problems Verbalize an understanding of healthy and unhealthy emotions verbalize insight into how past relationships may be influence current experiences with depression Use mindfulness and acceptance strategies and increase value based behavior  Increase hopeful statements about the future.   Interventions Teach about stress and ways to handle stress Assist the patient in developing a coping action plan for stressors Conduct skills based training for coping strategies Train problem focused skills Sort out what activities the individual can do Encourage patient to rely upon his/her spiritual faith Teach the patient to use guided imagery Probe and evaluate family's ability to provide emotional  support Allow family to share their fears Assist the patient in identifying, sorting through, and verbalizing the various feelings generated by his/her medical condition Meet with family members  Ask patient list out limitations  Use stress inoculation training  Use Acceptance and Commitment Therapy to help client accept uncomfortable realities in order to accomplish value-consistent goals Reinforce the client's insight into the role of his/her past emotional pain and present anxiety  Discuss examples demonstrating that unrealistic worry overestimates the probability of threats and underestimate patient's ability  Assist the patient in analyzing his or her worries Help patient understand that avoidance is reinforcing  Behavioral activation help the client explore the relationship, nature of the dispute,  Help the client develop new interpersonal skills and relationships Conduct Problem so living therapy Teach conflict resolution skills Use a process-experiential approach Conduct TLDP Conduct ACT  The patient and clinician reviewed the treatment plan on 10/12/2021. The patient approved of the treatment plan.    [name], PsyD

## 2021-12-22 ENCOUNTER — Other Ambulatory Visit: Payer: Self-pay | Admitting: Cardiology

## 2021-12-27 ENCOUNTER — Ambulatory Visit (INDEPENDENT_AMBULATORY_CARE_PROVIDER_SITE_OTHER): Payer: Medicare HMO | Admitting: Pharmacist

## 2021-12-27 ENCOUNTER — Other Ambulatory Visit: Payer: Self-pay | Admitting: Family Medicine

## 2021-12-27 DIAGNOSIS — I10 Essential (primary) hypertension: Secondary | ICD-10-CM

## 2021-12-27 DIAGNOSIS — Z8739 Personal history of other diseases of the musculoskeletal system and connective tissue: Secondary | ICD-10-CM

## 2021-12-27 NOTE — Patient Instructions (Signed)
Hi [name] [name],  It was great to catch up with you again! I am glad you are doing better.  Please reach out to me if you have any questions or need anything!  Best, Maddie  Jeni Salles, PharmD, [facility]   Visit Information   Goals Addressed   None    Patient Care Plan: CCM Pharmacy Care Plan     Problem Identified: Problem: Hypertension, Hyperlipidemia, Atrial Fibrillation, Coronary Artery Disease, GERD, Gout, and ED      Long-Range Goal: Patient-Specific Goal   Start Date: 04/12/2021  Expected End Date: 04/12/2022  Recent Progress: On track  Priority: High  Note:   Current Barriers:  Unable to independently monitor therapeutic efficacy Suboptimal therapeutic regimen for gout  Pharmacist Clinical Goal(s):  Patient will achieve adherence to monitoring guidelines and medication adherence to achieve therapeutic efficacy through collaboration with PharmD and provider.   Interventions: 1:1 collaboration with [name], MD regarding development and update of comprehensive plan of care as evidenced by provider attestation and co-signature Inter-disciplinary care team collaboration (see longitudinal plan of care) Comprehensive medication review performed; medication list updated in electronic medical record  Hypertension (BP goal <130/80) -Controlled -Current treatment: Metoprolol succinate 50 mg 1.5 tablets daily - in PM - Appropriate, Effective, Safe, Accessible Losartan 50 mg 1 tablet daily - in AM - Appropriate, Effective, Safe, Accessible -Medications previously tried: unknown (cough)  -Current home readings: 130/70-80 (arm cuff) - not sure -Current dietary habits: tries to limit salt intake; only adds salt to popcorn or corn on the cob; do eat some canned and frozen vegetables -Current exercise habits: very active at the gym and walking several days a week -Denies hypotensive/hypertensive  symptoms -Educated on BP goals and benefits of medications for prevention of heart attack, stroke and kidney damage; Daily salt intake goal < 2300 mg; Importance of home blood pressure monitoring; Proper BP monitoring technique; -Counseled to monitor BP at home weekly, document, and provide log at future appointments -Counseled on diet and exercise extensively Recommended to continue current medication  Hyperlipidemia: (LDL goal < 70) -Controlled -Current treatment: Atorvastatin 80 mg 1 tablet daily - Appropriate, Effective, Safe, Accessible -Medications previously tried: none  -Current dietary patterns: mostly lean meat (chicken and fish); lots of vegetables and fruit -Current exercise habits: very active at the gym and walking -Educated on Cholesterol goals;  Benefits of statin for ASCVD risk reduction; Exercise goal of 150 minutes per week; -Counseled on diet and exercise extensively Recommended to continue current medication  CAD (Goal: prevent heart events) -Controlled -Current treatment  Atorvastatin 80 mg 1 tablet daily - Appropriate, Effective, Safe, Accessible Nitroglycerin 0.4 mg 1 tablet as needed - Appropriate, Effective, Safe, Accessible -Medications previously tried: none  -Recommended to continue current medication Counseled on importance of checking the expiration date of nitroglycerin and getting and keeping supply up to date.   Atrial Fibrillation (Goal: prevent stroke and major bleeding) -Controlled -CHADSVASC: 3 -Current treatment: Rate control: Metoprolol succinate 50 mg 1.5 tablet daily; metoprolol tartrate 25 mg as needed - Appropriate, Effective, Safe, Accessible Anticoagulation: Eliquis 5 mg 1 tablet twice daily - Appropriate, Effective, Safe, Accessible -Medications previously tried: n/a -Home BP and HR readings: 70s -Counseled on importance of adherence to anticoagulant exactly as prescribed; bleeding risk associated with Eliquis and importance of  self-monitoring for signs/symptoms of bleeding; avoidance of NSAIDs due to increased bleeding risk with anticoagulants; -Counseled on diet and exercise extensively Recommended to continue current medication  Gout (Goal: uric acid < 6 and prevent flare ups) -Controlled -Current treatment  Colchicine 0.6 mg 1 tablet as needed - Appropriate, Query effective, Safe, Accessible -Medications previously tried: none  -Counseled on foods and beverages that can increase risk for gout flare ups such as alcohol, sweet beverages, red meat, organ meat and some seafood. Recommended allopurinol for gout prevention  Swelling (Goal: minimize fluid retention) -Controlled -Current treatment  Furosemide 20 mg 1 tablet twice daily - Appropriate, Effective, Safe, Accessible Potassium 20 mEq 1 tablet daily - Appropriate, Effective, Safe, Accessible -Medications previously tried: none  -Counseled on importance of daily weights with water pill and calling if gaining > 3 lbs in one day or > 5 lbs in one week.   Health Maintenance -Vaccine gaps: tetanus, influenza -Current therapy:  Acetaminophen 325 mg 2 tablets every 6 hours as needed Vitamin D 1000 units daily Coenzyme Q10 100 mg daily Multivitamin 1 tablet daily Tadalafil 20 mg 1 tablet as needed -Educated on Cost vs benefit of each product must be carefully weighed by individual consumer -Patient is satisfied with current therapy and denies issues -Educated on contraindication with use of Cialis and nitroglyerin within the same 3 days.  Patient Goals/Self-Care Activities Patient will:  - take medications as prescribed check blood pressure weekly, document, and provide at future appointments weigh daily, and contact provider if weight gain of > 3 lbs in one day or > 5 lbs in one week target a minimum of 150 minutes of moderate intensity exercise weekly  Follow Up Plan: Telephone follow up appointment with care management team member scheduled for: 6  months       Patient verbalizes understanding of instructions and care plan provided today and agrees to view in [place]. Active MyChart status and patient understanding of how to access instructions and care plan via MyChart confirmed with patient.    The pharmacy team will reach out to the patient again over the next 7 days.   Viona Gilmore, [facility]

## 2021-12-27 NOTE — Progress Notes (Signed)
Chronic Care Management Pharmacy Note  12/27/2021 Name:  [name] [name] MRN:  [id number] DOB:  [birth year]  Summary: BP at goal < 130/80 per office readings but not home readings Uric acid not at goal < 6  Recommendations/Changes made from today's visit: -Recommend allopurinol for gout prevention -Reached out to referral coordinator about rheumatology referral follow up  Plan: Follow up BP assessment in 3 months Follow up in 6 months   Subjective: [name] [name] is an 71 y.o. year old male who is a primary patient of [name] [name], [name] Sierras, MD.  The CCM team was consulted for assistance with disease management and care coordination needs.    Engaged with patient by telephone for follow up visit in response to provider referral for pharmacy case management and/or care coordination services.   Consent to Services:  The patient was given information about Chronic Care Management services, agreed to services, and gave verbal consent prior to initiation of services.  Please see initial visit note for detailed documentation.   Patient Care Team: [name] Post, MD as PCP - General (Family Medicine) [name] [name], [name] M, MD as PCP - Cardiology (Cardiology) [name] [name], [facility] as Pharmacist (Pharmacist)  Recent office visits: 09/25/21 [name] Littler, MD: Patient presented for diarrhea and knee pain.  Prescribed prednisone.  09/18/21 [name] Mitts, MD: Patient presented for joint swelling. Mild arthritis noted in knee. Uric acid elevated at 7.1.    07/28/21 [name] Littler, MD: Patient presented for tremors. Prescribed PRN tadalafil for ED. Provided telephone number for behavioral health to consider counseling.  Recent consult visits: 12/13/21 [name] Chancy, PsyD (psychology): Patient presented for depression follow up. Unable to access notes.  11/30/21 [name] Bogus, DO (neurology): Patient presented for tremor initial evaluation. Follow up in 6 months.  10/02/21 [name] [name], [name] M, MD  (Cardiology) - Patient was seen for Atrial fibrillation and additional issues. No medication changes. Follow up in 6 months.  09/28/21 [name] [name] (dermatology): Patient presented for follow up for psoriasis. Unable to access notes.  09/05/21 [name] Ruths, NP (cardiology): Patient presented for pre-anesthesia testing.  09/11/21 Patient presented for cardiac MRI.   05/31/2021 [name] Daubert MD ([facility]) - Patient was seen for Persistant Atrial Fibrillation. No medication changes noted. No follow up noted.  Hospital visits: None in previous 6 months  Objective:  Lab Results  Component Value Date   CREATININE 1.00 09/18/2021   BUN 18 09/18/2021   GFR 74.89 09/18/2021   GFRNONAA >60 10/19/2020   GFRAA 90 08/27/2019   NA 137 09/18/2021   K 3.8 09/18/2021   CALCIUM 9.6 09/18/2021   CO2 28 09/18/2021   GLUCOSE 107 (H) 09/18/2021    Lab Results  Component Value Date/Time   GFR 74.89 09/18/2021 02:29 PM   GFR 87.11 02/28/2021 09:24 AM    Last diabetic Eye exam: No results found for: HMDIABEYEEXA  Last diabetic Foot exam: No results found for: HMDIABFOOTEX   Lab Results  Component Value Date   CHOL 136 02/28/2021   HDL 65.60 02/28/2021   LDLCALC 59 02/28/2021   TRIG 54.0 02/28/2021   CHOLHDL 2 02/28/2021       Latest Ref Rng & Units 02/28/2021    9:24 AM 11/30/2019    8:27 AM 08/27/2019    9:09 AM  Hepatic Function  Total Protein 6.0 - 8.3 g/dL 6.8   6.4   6.9    Albumin 3.5 - 5.2 g/dL 4.5   4.6   4.7    AST  0 - 37 U/L _0 ALT 0 - 53 U/L _1 Alk Phosphatase 39 - 117 U/L 84   86   80    Total Bilirubin 0.2 - 1.2 mg/dL 1.2   1.2   0.9    Bilirubin, Direct 0.0 - 0.3 mg/dL 0.2   0.34   0.22      Lab Results  Component Value Date/Time   TSH 0.78 02/28/2021 09:24 AM   TSH 0.82 02/26/2020 09:16 AM       Latest Ref Rng & Units 09/18/2021    2:29 PM 09/13/2021   12:00 AM 02/28/2021    9:24 AM  CBC  WBC 4.0 - 10.5 K/uL 7.1   7.6      6.6     Hemoglobin 13.0 - 17.0 g/dL 14.0   14.0      15.3    Hematocrit 39.0 - 52.0 % 41.2   42      44.6    Platelets 150.0 - 400.0 K/uL 151.0   160      166.0       This result is from an external source.    No results found for: VD25OH  Clinical ASCVD: Yes  The 10-year ASCVD risk score (Arnett DK, et al., 2019) is: 18.1%   Values used to calculate the score:     Age: 11 years     Sex: Male     Is Non-Hispanic African American: No     Diabetic: No     Tobacco smoker: No     Systolic Blood Pressure: 144 mmHg     Is BP treated: Yes     HDL Cholesterol: 65.6 mg/dL     Total Cholesterol: 136 mg/dL       09/18/2021    2:08 PM 07/28/2021    2:47 PM 01/30/2021   10:01 AM  Depression screen PHQ 2/9  Decreased Interest 1 1 0  Down, Depressed, Hopeless 0 0 0  PHQ - 2 Score 1 1 0  Altered sleeping 1 0   Tired, decreased energy 0 0   Change in appetite 0 0   Feeling bad or failure about yourself  1 1   Trouble concentrating 0 0   Moving slowly or fidgety/restless 0 0   Suicidal thoughts 0 0   PHQ-9 Score 3 2      CHA2DS2/VAS Stroke Risk Points  Current as of 8 minutes ago     3 >= 2 Points: High Risk  1 - 1.99 Points: Medium Risk  0 Points: Low Risk    No Change      Details    This score determines the patient's risk of having a stroke if the  patient has atrial fibrillation.       Points Metrics  0 Has Congestive Heart Failure:  No    Current as of 8 minutes ago  1 Has Vascular Disease:  Yes    Current as of 8 minutes ago  1 Has Hypertension:  Yes    Current as of 8 minutes ago  1 Age:  39    Current as of 8 minutes ago  0 Has Diabetes:  No    Current as of 8 minutes ago  0 Had Stroke:  No  Had TIA:  No  Had Thromboembolism:  No    Current as of 8 minutes ago  0  Male:  No    Current as of 8 minutes ago        Social History   Tobacco Use  Smoking Status Former   Packs/day: 0.50   Years: 5.00   Pack years: 2.50   Types: Cigarettes  Smokeless Tobacco Never   Tobacco Comments   "quit smoking cigarettes in the late 1970's"   BP Readings from Last 3 Encounters:  11/30/21 121/69  10/02/21 108/65  09/25/21 118/60   Pulse Readings from Last 3 Encounters:  11/30/21 71  10/02/21 71  09/25/21 65   Wt Readings from Last 3 Encounters:  11/30/21 210 lb 9.6 oz (95.5 kg)  10/02/21 212 lb 9.6 oz (96.4 kg)  09/25/21 203 lb 8 oz (92.3 kg)   BMI Readings from Last 3 Encounters:  11/30/21 27.79 kg/m  10/02/21 28.05 kg/m  09/25/21 26.85 kg/m    Assessment/Interventions: Review of patient past medical history, allergies, medications, health status, including review of consultants reports, laboratory and other test data, was performed as part of comprehensive evaluation and provision of chronic care management services.   SDOH:  (Social Determinants of Health) assessments and interventions performed: Yes   SDOH Screenings   Alcohol Screen: Low Risk    Last Alcohol Screening Score (AUDIT): 2  Depression (PHQ2-9): Low Risk    PHQ-2 Score: 3  Financial Resource Strain: Low Risk    Difficulty of Paying Living Expenses: Not hard at all  Food Insecurity: No Food Insecurity   Worried About Charity fundraiser in the Last Year: Never true   Ran Out of Food in the Last Year: Never true  Housing: Low Risk    Last Housing Risk Score: 0  Physical Activity: Sufficiently Active   Days of Exercise per Week: 5 days   Minutes of Exercise per Session: 60 min  Social Connections: Engineer, building services of Communication with Friends and Family: Twice a week   Frequency of Social Gatherings with Friends and Family: Twice a week   Attends Religious Services: More than 4 times per year   Active Member of [employer] or Organizations: Yes   Attends Music therapist: More than 4 times per year   Marital Status: Married  Stress: No Stress Concern Present   Feeling of Stress : Not at all  Tobacco Use: Medium Risk   Smoking Tobacco Use: Former    Smokeless Tobacco Use: Never   Passive Exposure: Not on Pensions consultant Needs: No Transportation Needs   Lack of Transportation (Medical): No   Lack of Transportation (Non-Medical): No   Patient lives with his wife and goes to the gym 3 days a week and is part of the program called [employer]. They usually go to a group class from 7-7:30 am and they go three days a week. During the school year he meets with a trainer 3 days a week after the class as well. This includes weight training, cardio, and other strengthening. He also walks 2-3 days a week and does some mowing.  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   Patient goes to sleep around 10 pm and usually gets up around 6 am. He denies any problems with sleep and doesn't feel tired during the day and seldom takes nap.  Patient usually eats at home and tries to eat a  lot of vegetables and fruit. He only eats meat twice a week and it's usually chicken and fish. He eats a lot of salads and stir fried vegetables.  Patient doesn't think he is having any side effects from his medications. He did develop a cough with a blood pressure medication in the past and it was switched to losartan.   CCM Care Plan  No Known Allergies  Medications Reviewed Today     Reviewed by [name] [name], [facility] (Pharmacist) on 12/27/21 at [place] List Status: <None>   Medication Order Taking? Sig Documenting Provider Last Dose Status Informant  acetaminophen (TYLENOL) 325 MG tablet [id number]  Take 2 tablets (650 mg total) by mouth every 6 (six) hours as needed for up to 30 doses for mild pain or moderate pain. Wyvonnia Dusky, MD  Active   atorvastatin (LIPITOR) 80 MG tablet [id number]  TAKE 1 TABLET BY MOUTH EVERY DAY [name] [name], [name] M, MD  Active   cholecalciferol (VITAMIN D) 1000 UNITS tablet [id number]  Take 1,000  Units by mouth daily. [provider]  Active Self  Coenzyme Q10 (CO Q 10 PO) [id number]  Take 100 mg by mouth daily.  [provider]  Active Self  colchicine 0.6 MG tablet [id number] Yes TAKE 1 TABLET BY MOUTH TWICE A DAY  Patient taking differently: Take 0.6 mg by mouth daily as needed (gout).   [name] Post, MD Taking Active   ELIQUIS 5 MG TABS tablet [id number]  TAKE 1 TABLET BY MOUTH TWICE A DAY [name] [name], [name] M, MD  Active   furosemide (LASIX) 20 MG tablet [id number]  TAKE 1 TABLET BY MOUTH TWICE A DAY [name] [name], [name] M, MD  Active   losartan (COZAAR) 50 MG tablet [id number]  TAKE 1 TABLET BY MOUTH EVERY DAY [name] [name], [name] M, MD  Active   methotrexate 2.5 MG tablet [id number]  Take 2.5 mg by mouth once a week. Caution:Chemotherapy. Protect from light. Take on Tuesdays [provider]  Active Self  metoprolol succinate (TOPROL-XL) 50 MG 24 hr tablet [id number] Yes Take 75 mg by mouth daily. Take with or immediately following a meal. [provider] Taking Active   multivitamin [facility]) per tablet [id number]  Take 1 tablet by mouth daily. [provider]  Active Self  nitroGLYCERIN (NITROSTAT) 0.4 MG SL tablet [id number]  Place 1 tablet (0.4 mg total) under the tongue every 5 (five) minutes as needed for chest pain. Ledora Bottcher, PA  Active   potassium chloride SA (K-DUR,KLOR-CON) 20 MEQ tablet [id number]  Take 1 tablet (20 mEq total) by mouth daily. [name] [name], [name] M, MD  Active Self            Patient Active Problem List   Diagnosis Date Noted   Psoriatic arthritis [facility])    History of psoriasis    History of gout    GERD (gastroesophageal reflux disease)    Chicken pox    Persistent atrial fibrillation ([place])    Erectile dysfunction 05/06/2017   Overweight (BMI 25.0-29.9) 08/06/2016   OSA (obstructive sleep apnea) 08/06/2014   Snoring 05/22/2014   Hypertension    Atrial flutter ([place]) 03/14/2014   Psoriasis 02/27/2013   Gout  02/27/2013   S/P CABG (coronary artery bypass graft) 06/29/2011   Coronary artery disease    Hypercholesterolemia     Immunization History  Administered Date(s) Administered   Fluad Quad(high Dose 65+) 06/09/2019   Influenza, High Dose Seasonal PF 05/06/2017, 06/10/2018   Influenza,inj,Quad PF,6+ Mos  08/20/2013, 03/31/2014   Influenza-Unspecified 05/14/2017   Moderna SARS-COV2 Booster Vaccination 05/23/2020, 12/01/2020, 04/02/2021   Moderna Sars-Covid-2 Vaccination 08/17/2019, 09/17/2019   Pneumococcal Conjugate-13 08/20/2013   Pneumococcal Polysaccharide-23 02/26/2020   Td 07/23/2008   Patient hasn't had any recent episodes of Afib since his ablation in February. Patient reports he is doing well.   Patient did inquire about  Patient rheumatology referral - [place] system to make things easier -   Dr. Elease Hashimoto - allopurinol - ?   Conditions to be addressed/monitored:  Hypertension, Hyperlipidemia, Atrial Fibrillation, Coronary Artery Disease, GERD, Gout, and ED  Conditions addressed this visit: Hypertension, gout  Care Plan : [place]  Updates made by [name] [name], [place] since 12/27/2021 12:00 AM     Problem: Problem: Hypertension, Hyperlipidemia, Atrial Fibrillation, Coronary Artery Disease, GERD, Gout, and ED      Long-Range Goal: Patient-Specific Goal   Start Date: 04/12/2021  Expected End Date: 04/12/2022  Recent Progress: On track  Priority: High  Note:   Current Barriers:  Unable to independently monitor therapeutic efficacy Suboptimal therapeutic regimen for gout  Pharmacist Clinical Goal(s):  Patient will achieve adherence to monitoring guidelines and medication adherence to achieve therapeutic efficacy through collaboration with PharmD and provider.   Interventions: 1:1 collaboration with [name] Post, MD regarding development and update of comprehensive plan of care as evidenced by provider attestation and  co-signature Inter-disciplinary care team collaboration (see longitudinal plan of care) Comprehensive medication review performed; medication list updated in electronic medical record  Hypertension (BP goal <130/80) -Controlled -Current treatment: Metoprolol succinate 50 mg 1.5 tablets daily - in PM - Appropriate, Effective, Safe, Accessible Losartan 50 mg 1 tablet daily - in AM - Appropriate, Effective, Safe, Accessible -Medications previously tried: unknown (cough)  -Current home readings: 130/70-80 (arm cuff) - not sure -Current dietary habits: tries to limit salt intake; only adds salt to popcorn or corn on the cob; do eat some canned and frozen vegetables -Current exercise habits: very active at the gym and walking several days a week -Denies hypotensive/hypertensive symptoms -Educated on BP goals and benefits of medications for prevention of heart attack, stroke and kidney damage; Daily salt intake goal < 2300 mg; Importance of home blood pressure monitoring; Proper BP monitoring technique; -Counseled to monitor BP at home weekly, document, and provide log at future appointments -Counseled on diet and exercise extensively Recommended to continue current medication  Hyperlipidemia: (LDL goal < 70) -Controlled -Current treatment: Atorvastatin 80 mg 1 tablet daily - Appropriate, Effective, Safe, Accessible -Medications previously tried: none  -Current dietary patterns: mostly lean meat (chicken and fish); lots of vegetables and fruit -Current exercise habits: very active at the gym and walking -Educated on Cholesterol goals;  Benefits of statin for ASCVD risk reduction; Exercise goal of 150 minutes per week; -Counseled on diet and exercise extensively Recommended to continue current medication  CAD (Goal: prevent heart events) -Controlled -Current treatment  Atorvastatin 80 mg 1 tablet daily - Appropriate, Effective, Safe, Accessible Nitroglycerin 0.4 mg 1 tablet as needed -  Appropriate, Effective, Safe, Accessible -Medications previously tried: none  -Recommended to continue current medication Counseled on importance of checking the expiration date of nitroglycerin and getting and keeping supply up to date.   Atrial Fibrillation (Goal: prevent stroke and major bleeding) -Controlled -CHADSVASC: 3 -Current treatment: Rate control: Metoprolol succinate 50 mg 1.5 tablet daily; metoprolol tartrate 25 mg as needed - Appropriate, Effective, Safe, Accessible Anticoagulation: Eliquis 5 mg 1 tablet twice daily -  Appropriate, Effective, Safe, Accessible -Medications previously tried: n/a -Home BP and HR readings: 70s -Counseled on importance of adherence to anticoagulant exactly as prescribed; bleeding risk associated with Eliquis and importance of self-monitoring for signs/symptoms of bleeding; avoidance of NSAIDs due to increased bleeding risk with anticoagulants; -Counseled on diet and exercise extensively Recommended to continue current medication  Gout (Goal: uric acid < 6 and prevent flare ups) -Controlled -Current treatment  Colchicine 0.6 mg 1 tablet as needed - Appropriate, Query effective, Safe, Accessible -Medications previously tried: none  -Counseled on foods and beverages that can increase risk for gout flare ups such as alcohol, sweet beverages, red meat, organ meat and some seafood. Recommended allopurinol for gout prevention  Swelling (Goal: minimize fluid retention) -Controlled -Current treatment  Furosemide 20 mg 1 tablet twice daily - Appropriate, Effective, Safe, Accessible Potassium 20 mEq 1 tablet daily - Appropriate, Effective, Safe, Accessible -Medications previously tried: none  -Counseled on importance of daily weights with water pill and calling if gaining > 3 lbs in one day or > 5 lbs in one week.   Health Maintenance -Vaccine gaps: tetanus, influenza -Current therapy:  Acetaminophen 325 mg 2 tablets every 6 hours as  needed Vitamin D 1000 units daily Coenzyme Q10 100 mg daily Multivitamin 1 tablet daily Tadalafil 20 mg 1 tablet as needed -Educated on Cost vs benefit of each product must be carefully weighed by individual consumer -Patient is satisfied with current therapy and denies issues -Educated on contraindication with use of Cialis and nitroglyerin within the same 3 days.  Patient Goals/Self-Care Activities Patient will:  - take medications as prescribed check blood pressure weekly, document, and provide at future appointments weigh daily, and contact provider if weight gain of > 3 lbs in one day or > 5 lbs in one week target a minimum of 150 minutes of moderate intensity exercise weekly  Follow Up Plan: Telephone follow up appointment with care management team member scheduled for: 6 months      Medication Assistance: None required.  Patient affirms current coverage meets needs.   Compliance/Adherence/Medication fill history: Care Gaps: Tetanus, influenza, shingrix Last BP: 121/69  Star-Rating Drugs: Atorvastatin 22m - [facility] Losartan 553m- [facility]  [facility] is:  [id number],  [address] [place]one: [phone]: [phone] pill box? No - sitting out where he can see them; morning and evening pills; uses a pillbox for trips Pt endorses 99% compliance  We discussed: Benefits of medication synchronization, packaging and delivery as well as enhanced pharmacist oversight with [facility]. Patient decided to: Continue current medication management strategy  Care Plan and Follow Up Patient Decision:  Patient agrees to Care Plan and Follow-up.  Plan: Telephone follow up appointment with care management team member scheduled for:  6 months  MadJeni SallesharmD, BCAMiddle Villageharmacist LeBEast[place] [phone]

## 2021-12-29 ENCOUNTER — Telehealth: Payer: Self-pay | Admitting: Pharmacist

## 2021-12-29 MED ORDER — ALLOPURINOL 100 MG PO TABS: 100.0000 mg | ORAL_TABLET | Freq: Every day | ORAL | 2 refills | Status: DC

## 2021-12-29 NOTE — Telephone Encounter (Signed)
Attempted to call patient about new gout medication and appropriate follow up with Dr. Elease Hashimoto. Also wanted to follow up on previous rheumatology referral. Left a message requesting a call back.  Will attempt to reach patient next week if patient does not call back.

## 2021-12-29 NOTE — Addendum Note (Signed)
Addended by: [name] on: 12/29/2021 06:48 AM   Modules accepted: Orders

## 2022-01-03 NOTE — Telephone Encounter (Signed)
Called patient again but did not reach. Left a voicemail requesting a call back.

## 2022-01-05 DIAGNOSIS — Z8739 Personal history of other diseases of the musculoskeletal system and connective tissue: Secondary | ICD-10-CM | POA: Diagnosis not present

## 2022-01-05 DIAGNOSIS — M25522 Pain in left elbow: Secondary | ICD-10-CM | POA: Diagnosis not present

## 2022-01-05 DIAGNOSIS — M25422 Effusion, left elbow: Secondary | ICD-10-CM | POA: Diagnosis not present

## 2022-01-09 ENCOUNTER — Telehealth: Payer: Medicare HMO

## 2022-01-10 NOTE — Telephone Encounter (Signed)
Patient called back. Patient was aware that allopurinol was sent to his pharmacy and he is planning on picking that up to start on it. Discussed mechanism and side effects associated with it. Patient verbalized his understanding.  Patient is also aware that new rheumatology office never received his records from the previous. Patient will follow up on this.  Scheduled CPE with patient for August.

## 2022-01-17 ENCOUNTER — Ambulatory Visit (INDEPENDENT_AMBULATORY_CARE_PROVIDER_SITE_OTHER): Payer: Medicare HMO | Admitting: Psychologist

## 2022-01-17 DIAGNOSIS — R69 Illness, unspecified: Secondary | ICD-10-CM | POA: Diagnosis not present

## 2022-01-17 DIAGNOSIS — F341 Dysthymic disorder: Secondary | ICD-10-CM | POA: Diagnosis not present

## 2022-01-17 NOTE — Progress Notes (Signed)
[facility] Counselor/Therapist Progress Note  Patient ID: [name] [name], MRN: [id number],    Date: 01/17/2022  Time Spent: 1:02 pm to 1:47 pm; total time: 45 minutes   This session was held via in person. The patient consented to in-person therapy and was in the clinician's office. Limits of confidentiality were discussed with the patient.   Treatment Type: Individual Therapy  Reported Symptoms: Mild distress related to Parkinson's disease  Mental Status Exam: Appearance:  Well Groomed     Behavior: Appropriate  Motor: Normal  Speech/Language:  Clear and Coherent  Affect: Appropriate  Mood: normal  Thought process: normal  Thought content:   WNL  Sensory/Perceptual disturbances:   WNL  Orientation: oriented to person, place, and time/date  Attention: Good  Concentration: Good  Memory: WNL  Fund of knowledge:  Good  Insight:   Fair  Judgment:  Fair  Impulse Control: Good   Risk Assessment: Danger to Self:  No Self-injurious Behavior: No Danger to Others: No Duty to Warn:no Physical Aggression / Violence:No  Access to Firearms a concern: No  Gang Involvement:No   Subjective: Beginning the session, patient described himself as doing well as he has been out [place] on vacation. Patient spent the session reflecting on the vacations. From there, he explored the different activities he wants to do before he starts to notice a progression of Parkinson's disease. He then spent time processing the cognitive impairments that an individual can experience with Parkinson's disease stating that he was not aware of those impacts on an individual. He asked questions related how to assess for those impairments as well as steps to be preventative. He asked to follow up. He denied suicidal and homicidal ideation.    Interventions:  Worked on developing a therapeutic relationship with the patient using active listening and reflective statements. Provided emotional support  using empathy and validation. Used summary statements. Reflected on the vacations that patient was able to participate in. Identified goals for the session. Processed thoughts and emotions related to the progression of Parkinson's disease. Validated patient's concerns. Used socratic questions to assist the patient gain insight into self. Participated in doing a decisional analysis related to doing different activities before the disease progresses. Provided psychoeducation about cognitive impairment and Parkinson's disease. Processed thoughts and emotions. Provided psychoeducation about neuropsychological evaluations. Provided empathic statements. Assessed for suicidal and homicidal ideation.   Homework: Patient will speak with wife about vacations he wants to take.    Next Session: Review homework, and emotional support  Diagnosis: F34.1 persistent depressive disorder   Plan:   Client Abilities: Friendly and easy to develop rapport  Client Preferences: ACT and CBT  Client statement of Needs: Work towards motivation  Treatment Level: Outpatient  Goals Work through the grieving process and face reality of own death Accept emotional support from others around them Live life to the fullest, event though time may be limited Become as knowledgeable about the medical condition  Reduce fear, anxiety about the health condition  Accept the illness Accept the role of psychological and behavioral factors  Stabilize anxiety level wile increasing ability to function Learn and implement coping skills that result in a reduction of anxiety  Alleviate depressive symptoms Recognize, accept, and cope with depressive feelings Develop healthy thinking patterns Develop healthy interpersonal relationships  Objectives target date for all objectives is 09/19/2022 Identify feelings associated with the illness Family members share with each other feelings Identify the losses or limitations that have  been  experienced Verbalize acceptance of the reality of the medical condition Commit to learning and implement a proactive approach to managing personal stresses Verbalize an understanding of the medical condition Work with therapist to develop a plan for coping with stress Learn and implement skills for managing stress Engage in social, productive activities that are possible Engage in faith based activities implement positive imagery Identify coping skills and sources of emotional support Patient's partner and family members verbalize their fears regarding severity of health condition Identify sources of emotional distress  Learning and implement calming skills to reduce overall anxiety Learn and implement problem solving strategies Identify and engage in pleasant activities Learning and implement personal and interpersonal skills to reduce anxiety and improve interpersonal relationships Learn to accept limitations in life and commit to tolerating, rather than avoiding, unpleasant emotions while accomplishing meaningful goals Identify major life conflicts from the past and present that form the basis for present anxiety Learn and implement behavioral strategies Verbalize an understanding and resolution of current interpersonal problems Learn and implement problem solving and decision making skills Learn and implement conflict resolution skills to resolve interpersonal problems Verbalize an understanding of healthy and unhealthy emotions verbalize insight into how past relationships may be influence current experiences with depression Use mindfulness and acceptance strategies and increase value based behavior  Increase hopeful statements about the future.   Interventions Teach about stress and ways to handle stress Assist the patient in developing a coping action plan for stressors Conduct skills based training for coping strategies Train problem focused skills Sort out what activities the  individual can do Encourage patient to rely upon his/her spiritual faith Teach the patient to use guided imagery Probe and evaluate family's ability to provide emotional support Allow family to share their fears Assist the patient in identifying, sorting through, and verbalizing the various feelings generated by his/her medical condition Meet with family members  Ask patient list out limitations  Use stress inoculation training  Use Acceptance and Commitment Therapy to help client accept uncomfortable realities in order to accomplish value-consistent goals Reinforce the client's insight into the role of his/her past emotional pain and present anxiety  Discuss examples demonstrating that unrealistic worry overestimates the probability of threats and underestimate patient's ability  Assist the patient in analyzing his or her worries Help patient understand that avoidance is reinforcing  Behavioral activation help the client explore the relationship, nature of the dispute,  Help the client develop new interpersonal skills and relationships Conduct Problem so living therapy Teach conflict resolution skills Use a process-experiential approach Conduct TLDP Conduct ACT  The patient and clinician reviewed the treatment plan on 10/12/2021. The patient approved of the treatment plan.    [name], PsyD

## 2022-01-19 DIAGNOSIS — I2510 Atherosclerotic heart disease of native coronary artery without angina pectoris: Secondary | ICD-10-CM | POA: Diagnosis not present

## 2022-01-19 DIAGNOSIS — I4891 Unspecified atrial fibrillation: Secondary | ICD-10-CM

## 2022-01-19 DIAGNOSIS — I10 Essential (primary) hypertension: Secondary | ICD-10-CM | POA: Diagnosis not present

## 2022-01-30 ENCOUNTER — Telehealth: Payer: Self-pay | Admitting: Family Medicine

## 2022-01-30 NOTE — Telephone Encounter (Signed)
Left message for patient to call back and schedule Medicare Annual Wellness Visit (AWV) either virtually or in office. Left  my [name] [name] number [phone]   Last AWV 01/30/21 ; please schedule at anytime with [facility] Nurse Health Advisor 1 or 2

## 2022-02-14 ENCOUNTER — Ambulatory Visit (INDEPENDENT_AMBULATORY_CARE_PROVIDER_SITE_OTHER): Payer: Medicare HMO | Admitting: Psychologist

## 2022-02-14 DIAGNOSIS — R69 Illness, unspecified: Secondary | ICD-10-CM | POA: Diagnosis not present

## 2022-02-14 DIAGNOSIS — F341 Dysthymic disorder: Secondary | ICD-10-CM | POA: Diagnosis not present

## 2022-02-14 NOTE — Progress Notes (Signed)
[facility] Counselor/Therapist Progress Note  Patient ID: [name] [name], MRN: [id number],    Date: 02/14/2022  Time Spent: 1:03 pm to 1:41 pm; total time: 38 minutes  This session was held via video webex teletherapy due to the coronavirus risk at this time. The patient consented to video teletherapy and was located at his home during this session. He is aware it is the responsibility of the patient to secure confidentiality on his end of the session. The provider was in a private home office for the duration of this session. Limits of confidentiality were discussed with the patient.    Treatment Type: Individual Therapy  Reported Symptoms: Mild distress related to Parkinson's disease  Mental Status Exam: Appearance:  Well Groomed     Behavior: Appropriate  Motor: Normal  Speech/Language:  Clear and Coherent  Affect: Appropriate  Mood: normal  Thought process: normal  Thought content:   WNL  Sensory/Perceptual disturbances:   WNL  Orientation: oriented to person, place, and time/date  Attention: Good  Concentration: Good  Memory: WNL  Fund of knowledge:  Good  Insight:   Fair  Judgment:  Fair  Impulse Control: Good   Risk Assessment: Danger to Self:  No Self-injurious Behavior: No Danger to Others: No Duty to Warn:no Physical Aggression / Violence:No  Access to Firearms a concern: No  Gang Involvement:No   Subjective: Beginning the session, patient described himself as okay while reflecting on events since the last session. He indicated that he still experiences some distress related to Parkinson's disease. Through the conversation, patient identified challenges with disclosing to others and having social support. Patient explores ways to get social needs met. He asked to follow up. He denied suicidal and homicidal ideation.    Interventions:  Worked on developing a therapeutic relationship with the patient using active listening and reflective  statements. Provided emotional support using empathy and validation. Reflected on events since the last session. Normalized and validated expressed thoughts and emotions. Identified goals for the session. Explored what is most challenging for the patient regarding experiencing Parkinson's disease. Identified fear of the unknown. Attempted to provide psychoeducation about Parkinson's disease and specifically Lewy body dementia. Reflected on levels of support related to experiencing Parkinson's disease. Used socractic questions to assist the patient gain insight into self. Assisted in problem solving. Discussed next steps for counseling . Provided empathic statements. Assessed for suicidal and homicidal ideation.   Homework: Patient will reflect on what fears he experiences related to opening up. Explore what emotions he experiences  Next Session: Review homework, and emotional support. Reflect on the idea of sitting with emotions.   Diagnosis: F34.1 persistent depressive disorder   Plan:   Client Abilities: Friendly and easy to develop rapport  Client Preferences: ACT and CBT  Client statement of Needs: Work towards motivation  Treatment Level: Outpatient  Goals Work through the grieving process and face reality of own death Accept emotional support from others around them Live life to the fullest, event though time may be limited Become as knowledgeable about the medical condition  Reduce fear, anxiety about the health condition  Accept the illness Accept the role of psychological and behavioral factors  Stabilize anxiety level wile increasing ability to function Learn and implement coping skills that result in a reduction of anxiety  Alleviate depressive symptoms Recognize, accept, and cope with depressive feelings Develop healthy thinking patterns Develop healthy interpersonal relationships  Objectives target date for all objectives is 09/19/2022 Identify feelings  associated with  the illness Family members share with each other feelings Identify the losses or limitations that have been experienced Verbalize acceptance of the reality of the medical condition Commit to learning and implement a proactive approach to managing personal stresses Verbalize an understanding of the medical condition Work with therapist to develop a plan for coping with stress Learn and implement skills for managing stress Engage in social, productive activities that are possible Engage in faith based activities implement positive imagery Identify coping skills and sources of emotional support Patient's partner and family members verbalize their fears regarding severity of health condition Identify sources of emotional distress  Learning and implement calming skills to reduce overall anxiety Learn and implement problem solving strategies Identify and engage in pleasant activities Learning and implement personal and interpersonal skills to reduce anxiety and improve interpersonal relationships Learn to accept limitations in life and commit to tolerating, rather than avoiding, unpleasant emotions while accomplishing meaningful goals Identify major life conflicts from the past and present that form the basis for present anxiety Learn and implement behavioral strategies Verbalize an understanding and resolution of current interpersonal problems Learn and implement problem solving and decision making skills Learn and implement conflict resolution skills to resolve interpersonal problems Verbalize an understanding of healthy and unhealthy emotions verbalize insight into how past relationships may be influence current experiences with depression Use mindfulness and acceptance strategies and increase value based behavior  Increase hopeful statements about the future.   Interventions Teach about stress and ways to handle stress Assist the patient in developing a coping action plan for  stressors Conduct skills based training for coping strategies Train problem focused skills Sort out what activities the individual can do Encourage patient to rely upon his/her spiritual faith Teach the patient to use guided imagery Probe and evaluate family's ability to provide emotional support Allow family to share their fears Assist the patient in identifying, sorting through, and verbalizing the various feelings generated by his/her medical condition Meet with family members  Ask patient list out limitations  Use stress inoculation training  Use Acceptance and Commitment Therapy to help client accept uncomfortable realities in order to accomplish value-consistent goals Reinforce the client's insight into the role of his/her past emotional pain and present anxiety  Discuss examples demonstrating that unrealistic worry overestimates the probability of threats and underestimate patient's ability  Assist the patient in analyzing his or her worries Help patient understand that avoidance is reinforcing  Behavioral activation help the client explore the relationship, nature of the dispute,  Help the client develop new interpersonal skills and relationships Conduct Problem so living therapy Teach conflict resolution skills Use a process-experiential approach Conduct TLDP Conduct ACT  The patient and clinician reviewed the treatment plan on 10/12/2021. The patient approved of the treatment plan.    [name], PsyD

## 2022-02-16 ENCOUNTER — Ambulatory Visit (INDEPENDENT_AMBULATORY_CARE_PROVIDER_SITE_OTHER): Payer: Medicare HMO

## 2022-02-16 VITALS — BP 118/62 | HR 60 | Temp 97.9°F | Ht 73.0 in | Wt 207.5 lb

## 2022-02-16 DIAGNOSIS — Z1211 Encounter for screening for malignant neoplasm of colon: Secondary | ICD-10-CM

## 2022-02-16 DIAGNOSIS — Z0000 Encounter for general adult medical examination without abnormal findings: Secondary | ICD-10-CM

## 2022-02-16 NOTE — Patient Instructions (Addendum)
[name] [name] , Thank you for taking time to come for your Medicare Wellness Visit. I appreciate your ongoing commitment to your health goals. Please review the following plan we discussed and let me know if I can assist you in the future.   These are the goals we discussed:  Goals       Lose weight (pt-stated)      Weight (lb) < 200 lb (90.7 kg)      Patient would like to be at 180 lbs Reduce portions        This is a list of the screening recommended for you and due dates:  Health Maintenance  Topic Date Due   COVID-19 Vaccine (3 - Moderna series) 03/04/2022*   Zoster (Shingles) Vaccine (1 of 2) 05/19/2022*   Colon Cancer Screening  02/17/2023*   Tetanus Vaccine  02/17/2023*   Flu Shot  02/20/2022   Pneumonia Vaccine  Completed   Hepatitis C Screening: USPSTF Recommendation to screen - Ages 18-79 yo.  Completed   HPV Vaccine  Aged Out  *Topic was postponed. The date shown is not the original due date.    Advanced directives: Yes  Conditions/risks identified: None  Next appointment: Follow up in one year for your annual wellness visit.    Preventive Care 73 Years and Older, Male Preventive care refers to lifestyle choices and visits with your health care provider that can promote health and wellness. What does preventive care include? A yearly physical exam. This is also called an annual well check. Dental exams once or twice a year. Routine eye exams. Ask your health care provider how often you should have your eyes checked. Personal lifestyle choices, including: Daily care of your teeth and gums. Regular physical activity. Eating a healthy diet. Avoiding tobacco and drug use. Limiting alcohol use. Practicing safe sex. Taking low doses of aspirin every day. Taking vitamin and mineral supplements as recommended by your health care provider. What happens during an annual well check? The services and screenings done by your health care provider during your annual well  check will depend on your age, overall health, lifestyle risk factors, and family history of disease. Counseling  Your health care provider may ask you questions about your: Alcohol use. Tobacco use. Drug use. Emotional well-being. Home and relationship well-being. Sexual activity. Eating habits. History of falls. Memory and ability to understand (cognition). Work and work Statistician. Screening  You may have the following tests or measurements: Height, weight, and BMI. Blood pressure. Lipid and cholesterol levels. These may be checked every 5 years, or more frequently if you are over 73 years old. Skin check. Lung cancer screening. You may have this screening every year starting at age 16 if you have a 30-pack-year history of smoking and currently smoke or have quit within the past 15 years. Fecal occult blood test (FOBT) of the stool. You may have this test every year starting at age 65. Flexible sigmoidoscopy or colonoscopy. You may have a sigmoidoscopy every 5 years or a colonoscopy every 10 years starting at age 1. Prostate cancer screening. Recommendations will vary depending on your family history and other risks. Hepatitis C blood test. Hepatitis B blood test. Sexually transmitted disease (STD) testing. Diabetes screening. This is done by checking your blood sugar (glucose) after you have not eaten for a while (fasting). You may have this done every 1-3 years. Abdominal aortic aneurysm (AAA) screening. You may need this if you are a current or former smoker. Osteoporosis. You  may be screened starting at age 31 if you are at high risk. Talk with your health care provider about your test results, treatment options, and if necessary, the need for more tests. Vaccines  Your health care provider may recommend certain vaccines, such as: Influenza vaccine. This is recommended every year. Tetanus, diphtheria, and acellular pertussis (Tdap, Td) vaccine. You may need a Td booster  every 10 years. Zoster vaccine. You may need this after age 50. Pneumococcal 13-valent conjugate (PCV13) vaccine. One dose is recommended after age 87. Pneumococcal polysaccharide (PPSV23) vaccine. One dose is recommended after age 4. Talk to your health care provider about which screenings and vaccines you need and how often you need them. This information is not intended to replace advice given to you by your health care provider. Make sure you discuss any questions you have with your health care provider. Document Released: 08/05/2015 Document Revised: 03/28/2016 Document Reviewed: 05/10/2015 Elsevier Interactive Patient Education  2017 [place] Prevention in the Home Falls can cause injuries. They can happen to people of all ages. There are many things you can do to make your home safe and to help prevent falls. What can I do on the outside of my home? Regularly fix the edges of walkways and driveways and fix any cracks. Remove anything that might make you trip as you walk through a door, such as a raised step or threshold. Trim any bushes or trees on the path to your home. Use bright outdoor lighting. Clear any walking paths of anything that might make someone trip, such as rocks or tools. Regularly check to see if handrails are loose or broken. Make sure that both sides of any steps have handrails. Any raised decks and porches should have guardrails on the edges. Have any leaves, snow, or ice cleared regularly. Use sand or salt on walking paths during winter. Clean up any spills in your garage right away. This includes oil or grease spills. What can I do in the bathroom? Use night lights. Install grab bars by the toilet and in the tub and shower. Do not use towel bars as grab bars. Use non-skid mats or decals in the tub or shower. If you need to sit down in the shower, use a plastic, non-slip stool. Keep the floor dry. Clean up any water that spills on the floor as soon  as it happens. Remove soap buildup in the tub or shower regularly. Attach bath mats securely with double-sided non-slip rug tape. Do not have throw rugs and other things on the floor that can make you trip. What can I do in the bedroom? Use night lights. Make sure that you have a light by your bed that is easy to reach. Do not use any sheets or blankets that are too big for your bed. They should not hang down onto the floor. Have a firm chair that has side arms. You can use this for support while you get dressed. Do not have throw rugs and other things on the floor that can make you trip. What can I do in the kitchen? Clean up any spills right away. Avoid walking on wet floors. Keep items that you use a lot in easy-to-reach places. If you need to reach something above you, use a strong step stool that has a grab bar. Keep electrical cords out of the way. Do not use floor polish or wax that makes floors slippery. If you must use wax, use non-skid floor wax. Do  not have throw rugs and other things on the floor that can make you trip. What can I do with my stairs? Do not leave any items on the stairs. Make sure that there are handrails on both sides of the stairs and use them. Fix handrails that are broken or loose. Make sure that handrails are as long as the stairways. Check any carpeting to make sure that it is firmly attached to the stairs. Fix any carpet that is loose or worn. Avoid having throw rugs at the top or bottom of the stairs. If you do have throw rugs, attach them to the floor with carpet tape. Make sure that you have a light switch at the top of the stairs and the bottom of the stairs. If you do not have them, ask someone to add them for you. What else can I do to help prevent falls? Wear shoes that: Do not have high heels. Have rubber bottoms. Are comfortable and fit you well. Are closed at the toe. Do not wear sandals. If you use a stepladder: Make sure that it is fully  opened. Do not climb a closed stepladder. Make sure that both sides of the stepladder are locked into place. Ask someone to hold it for you, if possible. Clearly mark and make sure that you can see: Any grab bars or handrails. First and last steps. Where the edge of each step is. Use tools that help you move around (mobility aids) if they are needed. These include: Canes. Walkers. Scooters. Crutches. Turn on the lights when you go into a dark area. Replace any light bulbs as soon as they burn out. Set up your furniture so you have a clear path. Avoid moving your furniture around. If any of your floors are uneven, fix them. If there are any pets around you, be aware of where they are. Review your medicines with your doctor. Some medicines can make you feel dizzy. This can increase your chance of falling. Ask your doctor what other things that you can do to help prevent falls. This information is not intended to replace advice given to you by your health care provider. Make sure you discuss any questions you have with your health care provider. Document Released: 05/05/2009 Document Revised: 12/15/2015 Document Reviewed: 08/13/2014 Elsevier Interactive Patient Education  2017 [employer].

## 2022-02-16 NOTE — Progress Notes (Signed)
Subjective:   RAKIN LEMELLE is a 71 y.o. male who presents for Medicare Annual/Subsequent preventive examination.  Review of Systems     Cardiac Risk Factors include: advanced age (>18mn, >>33women);hypertension;male gender     Objective:    Today's Vitals   02/16/22 1025  BP: 118/62  Pulse: 60  Temp: 97.9 F (36.6 C)  TempSrc: Oral  SpO2: 97%  Weight: 207 lb 8 oz (94.1 kg)  Height: '6\' 1"'$  (1.854 m)   Body mass index is 27.38 kg/m.     02/16/2022   10:35 AM 03/02/2021    8:58 AM 01/30/2021   10:00 AM 11/16/2020    4:12 PM 07/21/2019   11:21 AM 04/08/2018    8:17 AM 07/17/2017   10:02 AM  Advanced Directives  Does Patient Have a Medical Advance Directive? Yes Yes Yes Yes Yes Yes No  Type of AParamedicof AConnerLiving will  HFarragutLiving will HJohnsonLiving will HAlbionLiving will    Does patient want to make changes to medical advance directive? No - Patient declined Yes (Inpatient - patient defers changing a medical advance directive and declines information at this time)   No - Patient declined    Copy of HRockwellin Chart? No - copy requested  No - copy requested  No - copy requested    Would patient like information on creating a medical advance directive?       No - Patient declined    Current Medications (verified) Outpatient Encounter Medications as of 02/16/2022  Medication Sig   acetaminophen (TYLENOL) 325 MG tablet Take 2 tablets (650 mg total) by mouth every 6 (six) hours as needed for up to 30 doses for mild pain or moderate pain.   allopurinol (ZYLOPRIM) 100 MG tablet Take 1 tablet (100 mg total) by mouth daily.   atorvastatin (LIPITOR) 80 MG tablet TAKE 1 TABLET BY MOUTH EVERY DAY   cholecalciferol (VITAMIN D) 1000 UNITS tablet Take 1,000 Units by mouth daily.   Coenzyme Q10 (CO Q 10 PO) Take 100 mg by mouth daily.    colchicine 0.6 MG tablet  TAKE 1 TABLET BY MOUTH TWICE A DAY (Patient taking differently: Take 0.6 mg by mouth daily as needed (gout).)   ELIQUIS 5 MG TABS tablet TAKE 1 TABLET BY MOUTH TWICE A DAY   furosemide (LASIX) 20 MG tablet TAKE 1 TABLET BY MOUTH TWICE A DAY   losartan (COZAAR) 50 MG tablet TAKE 1 TABLET BY MOUTH EVERY DAY   methotrexate 2.5 MG tablet Take 2.5 mg by mouth once a week. Caution:Chemotherapy. Protect from light. Take on Tuesdays   metoprolol succinate (TOPROL-XL) 50 MG 24 hr tablet Take 75 mg by mouth daily. Take with or immediately following a meal.   multivitamin (THERAGRAN) per tablet Take 1 tablet by mouth daily.   nitroGLYCERIN (NITROSTAT) 0.4 MG SL tablet Place 1 tablet (0.4 mg total) under the tongue every 5 (five) minutes as needed for chest pain.   potassium chloride SA (K-DUR,KLOR-CON) 20 MEQ tablet Take 1 tablet (20 mEq total) by mouth daily.   No facility-administered encounter medications on file as of 02/16/2022.    Allergies (verified) Patient has no known allergies.   History: Past Medical History:  Diagnosis Date   Chicken pox    Coronary artery disease    Severe three-vessel coronary artery disease with ejection fraction of 65%   GERD (gastroesophageal reflux disease)  History of gout    History of psoriasis    Hypercholesterolemia    Hypertension    OSA (obstructive sleep apnea)    Moderate with AHI of 16.9/hr now on CPAP at 10cm H2O   Psoriatic arthritis [facility])    Past Surgical History:  Procedure Laterality Date   ATRIAL FLUTTER ABLATION  03/30/2014   ATRIAL FLUTTER ABLATION N/A 03/30/2014   Procedure: ATRIAL FLUTTER ABLATION;  Surgeon: Coralyn Mark, MD;  Location: [place] CATH LAB;  Service: Cardiovascular;  Laterality: N/A;   CARDIAC CATHETERIZATION  09/2003   Ejection fraction was estimated at 65%.   CARDIOVERSION N/A 03/15/2014   Procedure: CARDIOVERSION;  Surgeon: Lelon Perla, MD;  Location: [facility] ENDOSCOPY;  Service: Cardiovascular;  Laterality: N/A;    CARDIOVERSION N/A 07/17/2017   Procedure: CARDIOVERSION;  Surgeon: Sueanne Margarita, MD;  Location: [facility] ENDOSCOPY;  Service: Cardiovascular;  Laterality: N/A;   CORONARY ARTERY BYPASS GRAFT  09/2003   [place]-lad,svg-diag,svg-om/distal LCX,svg-pda   TEE WITHOUT CARDIOVERSION N/A 03/15/2014   Procedure: TRANSESOPHAGEAL ECHOCARDIOGRAM (TEE);  Surgeon: Lelon Perla, MD;  Location: [facility] ENDOSCOPY;  Service: Cardiovascular;  Laterality: N/A;   TEE WITHOUT CARDIOVERSION N/A 07/17/2017   Procedure: TRANSESOPHAGEAL ECHOCARDIOGRAM (TEE);  Surgeon: Sueanne Margarita, MD;  Location: [facility] ENDOSCOPY;  Service: Cardiovascular;  Laterality: N/A;   TONSILLECTOMY  1950's   Family History  Problem Relation Age of Onset   Heart disease Mother        cabg   Cancer Mother        breast   Stroke Mother    Social History   Socioeconomic History   Marital status: Married    Spouse name: Not on file   Number of children: 1   Years of education: 4 years college   Highest education level: Bachelor's degree (e.g., BA, AB, BS)  Occupational History   Occupation: retired    Fish farm manager: [employer]  Tobacco Use   Smoking status: Former    Packs/day: 0.50    Years: 5.00    Total pack years: 2.50    Types: Cigarettes   Smokeless tobacco: Never   Tobacco comments:    "quit smoking cigarettes in the late 1970's"  Vaping Use   Vaping Use: Never used  Substance and Sexual Activity   Alcohol use: Yes    Alcohol/week: 4.0 standard drinks of alcohol    Types: 2 Cans of beer, 2 Shots of liquor per week    Comment: 3 times per week, 2-3 drinks each time   Drug use: No   Sexual activity: Yes  Other Topics Concern   Not on file  Social History Narrative   Working part time   Married; [place] of 2   1 child   Right handed   Retired    Scientist, physiological Strain: [place]  (02/16/2022)   Overall Financial Resource Strain (CARDIA)    Difficulty of Paying Living Expenses:  Not hard at all  Food Insecurity: No Food Insecurity (02/16/2022)   Hunger Vital Sign    Worried About Running Out of Food in the Last Year: Never true    [place] in the Last Year: Never true  Transportation Needs: No Transportation Needs (02/16/2022)   PRAPARE - Hydrologist (Medical): No    Lack of Transportation (Non-Medical): No  Physical Activity: Sufficiently Active (02/16/2022)   Exercise Vital Sign    Days of Exercise per  Week: 5 days    Minutes of Exercise per Session: 70 min  Stress: No Stress Concern Present (02/16/2022)   [place]    Feeling of Stress : Only a little  Social Connections: Socially Integrated (02/16/2022)   Social Connection and Isolation Panel [NHANES]    Frequency of Communication with Friends and Family: More than three times a week    Frequency of Social Gatherings with Friends and Family: More than three times a week    Attends Religious Services: More than 4 times per year    Active Member of [employer] or Organizations: Yes    Attends Music therapist: More than 4 times per year    Marital Status: Married    Tobacco Counseling Counseling given: Not Answered Tobacco comments: "quit smoking cigarettes in the late 1970's"   Clinical Intake:   How often do you need to have someone help you when you read instructions, pamphlets, or other written materials from your doctor or pharmacy?: 1 - Never  Diabetic?  No    Activities of Daily Living    02/16/2022   10:33 AM 02/14/2022    5:32 PM  In your present state of health, do you have any difficulty performing the following activities:  Hearing? 0 0  Vision? 0 0  Difficulty concentrating or making decisions? 0 0  Walking or climbing stairs? 0 0  Dressing or bathing? 0 0  Doing errands, shopping? 0 0  Preparing Food and eating ? N N  Using the Toilet? N N  In the past six months, have  you accidently leaked urine? N N  Do you have problems with loss of bowel control? N N  Managing your Medications? N N  Managing your Finances? N N  Housekeeping or managing your Housekeeping? N N    Patient Care Team: [name], MD as PCP - General (Family Medicine) [place], Peter M, MD as PCP - Cardiology (Cardiology) Viona Gilmore, [facility] as Pharmacist (Pharmacist)  Indicate any recent Medical Services you may have received from other than [facility] providers in the past year (date may be approximate).     Assessment:   This is a routine wellness examination for Javarian.  Hearing/Vision screen Hearing Screening - Comments:: No hearing difficulty Vision Screening - Comments:: No Vision Difficulty  Dietary issues and exercise activities discussed: Exercise limited by: None identified   Goals Addressed               This Visit's Progress     Lose weight (pt-stated)         Depression Screen    02/16/2022   10:31 AM 09/18/2021    2:08 PM 07/28/2021    2:47 PM 01/30/2021   10:01 AM 01/30/2021    9:57 AM 07/21/2019   11:24 AM 04/08/2018    8:24 AM  PHQ 2/9 Scores  PHQ - 2 Score 0 1 1 0 0 0 0  PHQ- 9 Score  3 2        Fall Risk    02/16/2022   10:34 AM 02/14/2022    5:32 PM 11/30/2021    9:44 AM 09/18/2021    2:08 PM 07/28/2021    2:42 PM  [place] in the past year? 0 0 0 0 1  Number falls in past yr: 0 0 0  0  Injury with Fall? 0 0 0 0 1  Risk for fall due  to : No Fall Risks        FALL RISK PREVENTION PERTAINING TO THE HOME:  Any stairs in or around the home? Yes  If so, are there any without handrails? No  Home free of loose throw rugs in walkways, pet beds, electrical cords, etc? Yes  Adequate lighting in your home to reduce risk of falls? Yes   ASSISTIVE DEVICES UTILIZED TO PREVENT FALLS:  Life alert? No  Use of a cane, walker or w/c? No  Grab bars in the bathroom? Yes  Shower chair or bench in shower? No  Elevated toilet seat or a  handicapped toilet? No   TIMED UP AND GO:  Was the test performed? Yes .  Length of time to ambulate 10 feet: 10 sec.   Gait steady and fast without use of assistive device  Cognitive Function:        02/16/2022   10:35 AM 07/21/2019   11:26 AM  6CIT Screen  What Year? 0 points 0 points  What month? 0 points 0 points  What time? 0 points 0 points  Count back from 20 0 points 0 points  Months in reverse 0 points 0 points  Repeat phrase 0 points 0 points  Total Score 0 points 0 points    Immunizations Immunization History  Administered Date(s) Administered   Fluad Quad(high Dose 65+) 06/09/2019   Influenza, High Dose Seasonal PF 05/06/2017, 06/10/2018   Influenza,inj,Quad PF,6+ Mos 08/20/2013, 03/31/2014   Influenza-Unspecified 05/14/2017   Moderna SARS-COV2 Booster Vaccination 05/23/2020, 12/01/2020, 04/02/2021   Moderna Sars-Covid-2 Vaccination 08/17/2019, 09/17/2019   Pneumococcal Conjugate-13 08/20/2013   Pneumococcal Polysaccharide-23 02/26/2020   Td 07/23/2008    TDAP status: Due, Education has been provided regarding the importance of this vaccine. Advised may receive this vaccine at local pharmacy or Health Dept. Aware to provide a copy of the vaccination record if obtained from local pharmacy or Health Dept. Verbalized acceptance and understanding.  Flu Vaccine status: Up to date  Pneumococcal vaccine status: Up to date  Covid-19 vaccine status: Completed vaccines  Qualifies for Shingles Vaccine? Yes   Zostavax completed No   Shingrix Completed?: No.    Education has been provided regarding the importance of this vaccine. Patient has been advised to call insurance company to determine out of pocket expense if they have not yet received this vaccine. Advised may also receive vaccine at local pharmacy or Health Dept. Verbalized acceptance and understanding.  Screening Tests Health Maintenance  Topic Date Due   COVID-19 Vaccine (3 - Moderna series) 03/04/2022  (Originally 05/28/2021)   Zoster Vaccines- Shingrix (1 of 2) 05/19/2022 (Originally 08/17/1998)   COLONOSCOPY (Pts 45-6yr Insurance coverage will need to be confirmed)  02/17/2023 (Originally 11/06/2021)   TETANUS/TDAP  02/17/2023 (Originally 07/23/2018)   INFLUENZA VACCINE  02/20/2022   Pneumonia Vaccine 71 Years old  Completed   Hepatitis C Screening  Completed   HPV VACCINES  Aged Out    Health Maintenance  There are no preventive care reminders to display for this patient.   Colorectal cancer screening: Referral to GI placed 02/16/22. Pt aware the office will call re: appt.  Lung Cancer Screening: (Low Dose CT Chest recommended if Age 73-80years, 30 pack-year currently smoking OR have quit w/in 15years.) does not qualify.     Additional Screening:  Hepatitis C Screening: does qualify; Completed 02/24/19  Vision Screening: Recommended annual ophthalmology exams for early detection of glaucoma and other disorders of the eye. Is the patient up  to date with their annual eye exam?  Yes  Who is the provider or what is the name of the office in which the patient attends annual eye exams? Patient deferred If pt is not established with a provider, would they like to be referred to a provider to establish care? No .   Dental Screening: Recommended annual dental exams for proper oral hygiene  Community Resource Referral / Chronic Care Management:  CRR required this visit?  No   CCM required this visit?  No      Plan:     I have personally reviewed and noted the following in the patient's chart:   Medical and social history Use of alcohol, tobacco or illicit drugs  Current medications and supplements including opioid prescriptions.  Functional ability and status Nutritional status Physical activity Advanced directives List of other physicians Hospitalizations, surgeries, and ER visits in previous 12 months Vitals Screenings to include cognitive, depression, and  falls Referrals and appointments  In addition, I have reviewed and discussed with patient certain preventive protocols, quality metrics, and best practice recommendations. A written personalized care plan for preventive services as well as general preventive health recommendations were provided to patient.     Criselda Peaches, LPN   5/32/9924   Nurse Notes: None

## 2022-02-22 DIAGNOSIS — I4892 Unspecified atrial flutter: Secondary | ICD-10-CM | POA: Diagnosis not present

## 2022-02-22 DIAGNOSIS — Z8679 Personal history of other diseases of the circulatory system: Secondary | ICD-10-CM | POA: Diagnosis not present

## 2022-02-22 DIAGNOSIS — I4819 Other persistent atrial fibrillation: Secondary | ICD-10-CM | POA: Diagnosis not present

## 2022-02-22 DIAGNOSIS — Z9889 Other specified postprocedural states: Secondary | ICD-10-CM | POA: Diagnosis not present

## 2022-03-06 ENCOUNTER — Ambulatory Visit (INDEPENDENT_AMBULATORY_CARE_PROVIDER_SITE_OTHER): Payer: Medicare HMO | Admitting: Family Medicine

## 2022-03-06 ENCOUNTER — Encounter: Payer: Self-pay | Admitting: Family Medicine

## 2022-03-06 VITALS — BP 110/60 | HR 58 | Temp 97.6°F | Ht 72.84 in | Wt 205.4 lb

## 2022-03-06 DIAGNOSIS — Z0000 Encounter for general adult medical examination without abnormal findings: Secondary | ICD-10-CM

## 2022-03-06 DIAGNOSIS — Z125 Encounter for screening for malignant neoplasm of prostate: Secondary | ICD-10-CM

## 2022-03-06 DIAGNOSIS — I10 Essential (primary) hypertension: Secondary | ICD-10-CM

## 2022-03-06 LAB — PSA, MEDICARE: PSA: 0.67 ng/ml (ref 0.10–4.00)

## 2022-03-06 LAB — BASIC METABOLIC PANEL
BUN: 15 mg/dL (ref 6–23)
CO2: 29 mEq/L (ref 19–32)
Calcium: 9.7 mg/dL (ref 8.4–10.5)
Chloride: 102 mEq/L (ref 96–112)
Creatinine, Ser: 0.85 mg/dL (ref 0.40–1.50)
GFR: 86.18 mL/min (ref >60.00)
Glucose, Bld: 97 mg/dL (ref 70–99)
Potassium: 4.6 mEq/L (ref 3.5–5.1)
Sodium: 139 mEq/L (ref 135–145)

## 2022-03-06 LAB — CBC WITH DIFFERENTIAL/PLATELET
Basophils Absolute: 0 10*3/uL (ref 0.0–0.1)
Basophils Relative: 0.6 % (ref 0.0–3.0)
Eosinophils Absolute: 0.1 10*3/uL (ref 0.0–0.7)
Eosinophils Relative: 1.9 % (ref 0.0–5.0)
HCT: 44.3 % (ref 39.0–52.0)
Hemoglobin: 15 g/dL (ref 13.0–17.0)
Lymphocytes Relative: 22.8 % (ref 12.0–46.0)
Lymphs Abs: 1.2 10*3/uL (ref 0.7–4.0)
MCHC: 33.8 g/dL (ref 30.0–36.0)
MCV: 91.7 fl (ref 78.0–100.0)
Monocytes Absolute: 0.6 10*3/uL (ref 0.1–1.0)
Monocytes Relative: 12.6 % — ABNORMAL HIGH (ref 3.0–12.0)
Neutro Abs: 3.2 10*3/uL (ref 1.4–7.7)
Neutrophils Relative %: 62.1 % (ref 43.0–77.0)
Platelets: 157 10*3/uL (ref 150.0–400.0)
RBC: 4.82 Mil/uL (ref 4.22–5.81)
RDW: 13.5 % (ref 11.5–15.5)
WBC: 5.1 10*3/uL (ref 4.0–10.5)

## 2022-03-06 LAB — HEPATIC FUNCTION PANEL
ALT: 16 U/L (ref 0–53)
AST: 20 U/L (ref 0–37)
Albumin: 4.5 g/dL (ref 3.5–5.2)
Alkaline Phosphatase: 74 U/L (ref 39–117)
Bilirubin, Direct: 0.2 mg/dL (ref 0.0–0.3)
Total Bilirubin: 1.1 mg/dL (ref 0.2–1.2)
Total Protein: 6.6 g/dL (ref 6.0–8.3)

## 2022-03-06 LAB — TSH: TSH: 0.85 u[IU]/mL (ref 0.35–5.50)

## 2022-03-06 LAB — LIPID PANEL
Cholesterol: 131 mg/dL (ref 0–200)
HDL: 57.2 mg/dL (ref >39.00)
LDL Cholesterol: 62 mg/dL (ref 0–99)
NonHDL: 74.19
Total CHOL/HDL Ratio: 2
Triglycerides: 61 mg/dL (ref 0.0–149.0)
VLDL: 12.2 mg/dL (ref 0.0–40.0)

## 2022-03-06 NOTE — Progress Notes (Signed)
Established Patient Office Visit  Subjective   Patient ID: [name] [name], male    DOB: February 25, 1951  Age: 71 y.o. MRN: [id number]  Chief Complaint  Patient presents with   Annual Exam    HPI   Mr. Fricker is seen for physical exam.  He has history of atrial fibrillation, CAD, hypertension, psoriatic arthritis, obstructive sleep apnea, hyperlipidemia, history of gout.  He has a tremor mostly upper extremities and was seen by neurology and diagnosed with Parkinson's disease.  Not requiring medication yet.  He is doing a couple of exercise classes specifically designed for patients with Parkinson's.  He also exercises at the gym few days per week.  No recent falls.  Overall doing fairly well.  Recently was sent in allopurinol to consider starting prophylactic medication for gout but he never started that.  Health maintenance reviewed  -Pneumonia vaccines complete -Previous hepatitis C screen negative -Tetanus due next year -Declines Shingrix -In process of getting repeat colonoscopy  Family history-'s mother had breast cancer and heart disease.  Mother also had history of stroke.  Social history-married.  He quit smoking late 70s only about 2 and half pack-year history.  No regular alcohol.  Past Medical History:  Diagnosis Date   Chicken pox    Coronary artery disease    Severe three-vessel coronary artery disease with ejection fraction of 65%   GERD (gastroesophageal reflux disease)    History of gout    History of psoriasis    Hypercholesterolemia    Hypertension    OSA (obstructive sleep apnea)    Moderate with AHI of 16.9/hr now on CPAP at 10cm H2O   Psoriatic arthritis ([place])    Past Surgical History:  Procedure Laterality Date   ATRIAL FLUTTER ABLATION  03/30/2014   ATRIAL FLUTTER ABLATION N/A 03/30/2014   Procedure: ATRIAL FLUTTER ABLATION;  Surgeon: Coralyn Mark, MD;  Location: [place] CATH LAB;  Service: Cardiovascular;  Laterality: N/A;   CARDIAC CATHETERIZATION   09/2003   Ejection fraction was estimated at 65%.   CARDIOVERSION N/A 03/15/2014   Procedure: CARDIOVERSION;  Surgeon: Lelon Perla, MD;  Location: [facility] ENDOSCOPY;  Service: Cardiovascular;  Laterality: N/A;   CARDIOVERSION N/A 07/17/2017   Procedure: CARDIOVERSION;  Surgeon: Sueanne Margarita, MD;  Location[facility] ENDOSCOPY;  Service: Cardiovascular;  Laterality: N/A;   CORONARY ARTERY BYPASS GRAFT  09/2003   [place]-lad,svg-diag,svg-om/distal LCX,svg-pda   TEE WITHOUT CARDIOVERSION N/A 03/15/2014   Procedure: TRANSESOPHAGEAL ECHOCARDIOGRAM (TEE);  Surgeon: Lelon Perla, MD;  Location: [facility] ENDOSCOPY;  Service: Cardiovascular;  Laterality: N/A;   TEE WITHOUT CARDIOVERSION N/A 07/17/2017   Procedure: TRANSESOPHAGEAL ECHOCARDIOGRAM (TEE);  Surgeon: Sueanne Margarita, MD;  Location: [facility] ENDOSCOPY;  Service: Cardiovascular;  Laterality: N/A;   TONSILLECTOMY  1950's    reports that he has quit smoking. His smoking use included cigarettes. He has a 2.50 pack-year smoking history. He has never used smokeless tobacco. He reports current alcohol use of about 4.0 standard drinks of alcohol per week. He reports that he does not use drugs. family history includes Cancer in his mother; Heart disease in his mother; Stroke in his mother. No Known Allergies  Review of Systems  Constitutional:  Negative for malaise/fatigue.  Eyes:  Negative for blurred vision.  Respiratory:  Negative for shortness of breath.   Cardiovascular:  Negative for chest pain.  Gastrointestinal:  Negative for abdominal pain.  Skin:  Positive for rash.  Neurological:  Negative for dizziness, weakness and headaches.  Objective:     BP 110/60 (BP Location: Left Arm, Patient Position: Sitting, Cuff Size: Normal)   Pulse (!) 58   Temp 97.6 F (36.4 C) (Oral)   Ht 6' 0.84" (1.85 m)   Wt 205 lb 6.4 oz (93.2 kg)   SpO2 99%   BMI 27.22 kg/m  BP Readings from Last 3 Encounters:  03/06/22 110/60  02/16/22 118/62  11/30/21 121/69    Wt Readings from Last 3 Encounters:  03/06/22 205 lb 6.4 oz (93.2 kg)  02/16/22 207 lb 8 oz (94.1 kg)  11/30/21 210 lb 9.6 oz (95.5 kg)      Physical Exam Vitals reviewed.  Constitutional:      Appearance: Normal appearance. He is well-developed.  HENT:     Right Ear: External ear normal.     Left Ear: External ear normal.  Eyes:     Pupils: Pupils are equal, round, and reactive to light.  Neck:     Thyroid: No thyromegaly.  Cardiovascular:     Rate and Rhythm: Normal rate and regular rhythm.  Pulmonary:     Effort: Pulmonary effort is normal. No respiratory distress.     Breath sounds: Normal breath sounds. No wheezing or rales.  Musculoskeletal:     Cervical back: Neck supple.     Right lower leg: No edema.     Left lower leg: No edema.  Skin:    Comments: Diffuse rash consistent with plaque psoriasis.  Neurological:     Mental Status: He is alert and oriented to person, place, and time.      Results for orders placed or performed in visit on 03/06/22  PSA, Medicare  Result Value Ref Range   PSA 0.67 0.10 - 4.00 ng/ml  Hepatic function panel  Result Value Ref Range   Total Bilirubin 1.1 0.2 - 1.2 mg/dL   Bilirubin, Direct 0.2 0.0 - 0.3 mg/dL   Alkaline Phosphatase 74 39 - 117 U/L   AST 20 0 - 37 U/L   ALT 16 0 - 53 U/L   Total Protein 6.6 6.0 - 8.3 g/dL   Albumin 4.5 3.5 - 5.2 g/dL  TSH  Result Value Ref Range   TSH 0.85 0.35 - 5.50 uIU/mL  CBC with Differential/Platelet  Result Value Ref Range   WBC 5.1 4.0 - 10.5 K/uL   RBC 4.82 4.22 - 5.81 Mil/uL   Hemoglobin 15.0 13.0 - 17.0 g/dL   HCT 44.3 39.0 - 52.0 %   MCV 91.7 78.0 - 100.0 fl   MCHC 33.8 30.0 - 36.0 g/dL   RDW 13.5 11.5 - 15.5 %   Platelets 157.0 150.0 - 400.0 K/uL   Neutrophils Relative % 62.1 43.0 - 77.0 %   Lymphocytes Relative 22.8 12.0 - 46.0 %   Monocytes Relative 12.6 (H) 3.0 - 12.0 %   Eosinophils Relative 1.9 0.0 - 5.0 %   Basophils Relative 0.6 0.0 - 3.0 %   Neutro Abs 3.2 1.4  - 7.7 K/uL   Lymphs Abs 1.2 0.7 - 4.0 K/uL   Monocytes Absolute 0.6 0.1 - 1.0 K/uL   Eosinophils Absolute 0.1 0.0 - 0.7 K/uL   Basophils Absolute 0.0 0.0 - 0.1 K/uL  Lipid panel  Result Value Ref Range   Cholesterol 131 0 - 200 mg/dL   Triglycerides 61.0 0.0 - 149.0 mg/dL   HDL 57.20 >39.00 mg/dL   VLDL 12.2 0.0 - 40.0 mg/dL   LDL Cholesterol 62 0 - 99 mg/dL   Total  CHOL/HDL Ratio 2    NonHDL 55.21   Basic metabolic panel  Result Value Ref Range   Sodium 139 135 - 145 mEq/L   Potassium 4.6 3.5 - 5.1 mEq/L   Chloride 102 96 - 112 mEq/L   CO2 29 19 - 32 mEq/L   Glucose, Bld 97 70 - 99 mg/dL   BUN 15 6 - 23 mg/dL   Creatinine, Ser 0.85 0.40 - 1.50 mg/dL   GFR 86.18 >60.00 mL/min   Calcium 9.7 8.4 - 10.5 mg/dL      The 10-year ASCVD risk score (Arnett DK, et al., 2019) is: 16.1%    Assessment & Plan:   Problem List Items Addressed This Visit   None Visit Diagnoses     Physical exam    -  Primary   Relevant Orders   Basic metabolic panel (Completed)   Lipid panel (Completed)   CBC with Differential/Platelet (Completed)   TSH (Completed)   Hepatic function panel (Completed)   Prostate cancer screening       Relevant Orders   PSA, Medicare (Completed)     The natural history of prostate cancer and ongoing controversy regarding screening and potential treatment outcomes of prostate cancer has been discussed with the patient. The meaning of a false positive PSA and a false negative PSA has been discussed. He indicates understanding of the limitations of this screening test and wishes to proceed with screening PSA testing.  -Continue regular exercise habits -Recommend flu vaccine this fall -In process of getting referred for repeat colonoscopy   No follow-ups on file.    [name], MD

## 2022-03-16 ENCOUNTER — Ambulatory Visit: Payer: Medicare HMO | Admitting: Psychologist

## 2022-03-22 ENCOUNTER — Encounter: Payer: Self-pay | Admitting: Cardiology

## 2022-03-22 MED ORDER — NITROGLYCERIN 0.4 MG SL SUBL
0.4000 mg | SUBLINGUAL_TABLET | SUBLINGUAL | 2 refills | Status: DC | PRN
Start: 2022-03-22 — End: 2023-05-17

## 2022-04-03 DIAGNOSIS — Z08 Encounter for follow-up examination after completed treatment for malignant neoplasm: Secondary | ICD-10-CM | POA: Diagnosis not present

## 2022-04-03 DIAGNOSIS — Z85820 Personal history of malignant melanoma of skin: Secondary | ICD-10-CM | POA: Diagnosis not present

## 2022-04-03 DIAGNOSIS — L814 Other melanin hyperpigmentation: Secondary | ICD-10-CM | POA: Diagnosis not present

## 2022-04-03 DIAGNOSIS — L400 Psoriasis vulgaris: Secondary | ICD-10-CM | POA: Diagnosis not present

## 2022-04-03 DIAGNOSIS — D225 Melanocytic nevi of trunk: Secondary | ICD-10-CM | POA: Diagnosis not present

## 2022-04-03 DIAGNOSIS — L821 Other seborrheic keratosis: Secondary | ICD-10-CM | POA: Diagnosis not present

## 2022-04-03 NOTE — Progress Notes (Signed)
Cardiology Office Note   Date:  04/06/2022   ID:  Dandra, Velardi 14-Jun-1951, MRN [id number]  PCP:  [name], MD  Cardiologist:   Chen Saadeh Martinique, MD   Chief Complaint  Patient presents with   Congestive Heart Failure   Atrial Fibrillation       History of Present Illness: [name] [name] is a 71 y.o. male who is seen for follow up Afib, CAD, AI, and CHF. He has a history of  atrial flutter and CAD. He is status post CABG in 2005. He had a normal stress echo in May of 2013. In August 2015 he presented with atrial flutter with RVR. He had mildly elevated troponins. He had successful TEE guided DCCV. He then underwent ablation of atrial flutter and ectopic atrial tachycardia on 03/30/14 by Dr. [name].   Stress Myoview September 2015 demonstrated excellent exercise tolerance and normal perfusion. TEE showed normal LV function, mild biatrial enlargement, mild MR,TR,AI. He does have moderate obstructive sleep apnea followed by Dr. Radford Pax and is on CPAP therapy.  He was seen in December 2018 with  an irregular and fast pulse. Was found to be in AFib with RVR. Echo showed EF 40-45% with moderate MR and AI and moderate biatrial enlargement. Toprol dose was increased and he was started on Eliquis. Underwent TEE guided DCCV on 07/17/17. Echo showed EF 40% with moderate biatrial enlargement. Moderate to severe MR, moderate TR, patent foramen ovale. He was successfully cardioverted but had recurrence of Afib on 07/24/17. He was seen in Afib clinic with Dr. [name] for consideration of Afib ablation versus AAD therapy. There was discussion about starting him on Tikosyn. Given new LV dysfunction and valvular disease it was felt that right and left heart cath were indicated prior to initiation of AAD therapy.  He also wore a monitor that showed episodes of wide complex tachycardia and it was unclear whether this was aberrancy versus VT.  He got a  second opinion at [facility] with Dr. [name]. He was  admitted in February 2019 and had a nuclear stress test showing no ischemia. EF 44%. He was loaded with Tikosyn and DCCV was performed. he had significant QT prolongation and Tikosyn was discontinued. He later underwent Cardiac MRI. He underwent AFib ablation with pulmonary vein isolation on 10/15/17. Echo following ablation showed EF 50-55%.  Both MRI and Echo showed evidence of moderate to severe AI. LV function had returned to normal with restoration of NSR.  In June 2020 he had recurrent Afib and underwent DCCV at [facility]. On follow up he was noted to be bradycardic and metoprolol dose was reduced. In September 2020 he had an Echo at [facility] showing normal LV function and moderate AI. He is followed regularly by Dr [name] at [facility]. Seen there in August. Echo done showing moderate AI. Normal LV function.  He was seen there at [facility] in October. Note reports NSR with PACs but Ecg.  He presented to the ER on November 16, 2020 with fast heart beat: SVT vs fib/flutter.  Adenosine given with short break to 10 beats of VT, then back to SVT with RBBB. DCCV was about to be performed, but he converted on his own when they sat the patient up. He was discharged without admission. He sent monitor strips from 4/28 with Afib in the 170s. BB was increased. Strips were faxed to his [place] cardiologist. Had first occurrence of Afib RVR on March 30 - HR 190s. He converted to NSR.  [address] [address] and broke ribs on April 27th. Three dogs jumped on him and he fell and broke 4 ribs on right side (3-6). No head injury, no LOC. Monitor showed Afib 4/28-5/7 - could be related to his rib injury. On subsequent follow up in the office had no recurrence of Afib.   More recently he had recurrent Afib. He underwent redo AFib and Atrial tachycardia ablation by Dr [name] at [facility] on 09/12/21. Prior to this he had cardiac MRI showing mod-severe AI, mild LV enlargement - really unchanged from 2019.  Echo in Dec 2022 showed normal LV function and moderate AI.   He  was seen by [facility] in August and doing well in NSR. Has seen Neurology (Dr Tat) for evaluation of mild Parkinson's symptoms. He is working with an exercise program. Still doing exercise program at [facility] 3 days a week. He has no chest pain, dyspnea or palpitations. Energy level is good.    Past Medical History:  Diagnosis Date   Chicken pox    Coronary artery disease    Severe three-vessel coronary artery disease with ejection fraction of 65%   GERD (gastroesophageal reflux disease)    History of gout    History of psoriasis    Hypercholesterolemia    Hypertension    OSA (obstructive sleep apnea)    Moderate with AHI of 16.9/hr now on CPAP at 10cm H2O   Psoriatic arthritis ([place])     Past Surgical History:  Procedure Laterality Date   ATRIAL FLUTTER ABLATION  03/30/2014   ATRIAL FLUTTER ABLATION N/A 03/30/2014   Procedure: ATRIAL FLUTTER ABLATION;  Surgeon: Coralyn Mark, MD;  Location: [facility] CATH LAB;  Service: Cardiovascular;  Laterality: N/A;   CARDIAC CATHETERIZATION  09/2003   Ejection fraction was estimated at 65%.   CARDIOVERSION N/A 03/15/2014   Procedure: CARDIOVERSION;  Surgeon: Lelon Perla, MD;  Location: [facility] ENDOSCOPY;  Service: Cardiovascular;  Laterality: N/A;   CARDIOVERSION N/A 07/17/2017   Procedure: CARDIOVERSION;  Surgeon: Sueanne Margarita, MD;  Location: [facility] ENDOSCOPY;  Service: Cardiovascular;  Laterality: N/A;   CORONARY ARTERY BYPASS GRAFT  09/2003   [place]-lad,svg-diag,svg-om/distal LCX,svg-pda   TEE WITHOUT CARDIOVERSION N/A 03/15/2014   Procedure: TRANSESOPHAGEAL ECHOCARDIOGRAM (TEE);  Surgeon: Lelon Perla, MD;  Location: [facility] ENDOSCOPY;  Service: Cardiovascular;  Laterality: N/A;   TEE WITHOUT CARDIOVERSION N/A 07/17/2017   Procedure: TRANSESOPHAGEAL ECHOCARDIOGRAM (TEE);  Surgeon: Sueanne Margarita, MD;  Location: [facility] ENDOSCOPY;  Service: Cardiovascular;  Laterality: N/A;   TONSILLECTOMY  1950's     Current Outpatient Medications  Medication Sig Dispense  Refill   acetaminophen (TYLENOL) 325 MG tablet Take 2 tablets (650 mg total) by mouth every 6 (six) hours as needed for up to 30 doses for mild pain or moderate pain. 30 tablet 0   atorvastatin (LIPITOR) 80 MG tablet TAKE 1 TABLET BY MOUTH EVERY DAY 90 tablet 3   cholecalciferol (VITAMIN D) 1000 UNITS tablet Take 1,000 Units by mouth daily.     Coenzyme Q10 (CO Q 10 PO) Take 100 mg by mouth daily.      colchicine 0.6 MG tablet TAKE 1 TABLET BY MOUTH TWICE A DAY (Patient taking differently: Take 0.6 mg by mouth daily as needed (gout).) 180 tablet 1   ELIQUIS 5 MG TABS tablet TAKE 1 TABLET BY MOUTH TWICE A DAY 180 tablet 1   furosemide (LASIX) 20 MG tablet TAKE 1 TABLET BY MOUTH TWICE A DAY 180 tablet 3   losartan (COZAAR) 50  MG tablet TAKE 1 TABLET BY MOUTH EVERY DAY 90 tablet 2   methotrexate 2.5 MG tablet Take 2.5 mg by mouth once a week. Caution:Chemotherapy. Protect from light. Take on Tuesdays     metoprolol succinate (TOPROL-XL) 50 MG 24 hr tablet Take 75 mg by mouth daily. Take with or immediately following a meal.     multivitamin (THERAGRAN) per tablet Take 1 tablet by mouth daily.     nitroGLYCERIN (NITROSTAT) 0.4 MG SL tablet Place 1 tablet (0.4 mg total) under the tongue every 5 (five) minutes as needed for chest pain. 25 tablet 2   potassium chloride SA (K-DUR,KLOR-CON) 20 MEQ tablet Take 1 tablet (20 mEq total) by mouth daily. 30 tablet 6   triamcinolone cream (KENALOG) 0.1 % SMARTSIG:1 Application Topical 2-3 Times Daily     No current facility-administered medications for this visit.    Allergies:   Patient has no known allergies.    Social History:  The patient  reports that he has quit smoking. His smoking use included cigarettes. He has a 2.50 pack-year smoking history. He has never used smokeless tobacco. He reports current alcohol use of about 4.0 standard drinks of alcohol per week. He reports that he does not use drugs.   Family History:  The patient's family history  includes Cancer in his mother; Heart disease in his mother; Stroke in his mother.    ROS:  Please see the history of present illness.   Otherwise, review of systems are positive for none.   All other systems are reviewed and negative.    PHYSICAL EXAM: VS:  BP 118/62   Pulse 61   Ht '6\' 1"'$  (1.854 m)   Wt 209 lb 6.4 oz (95 kg)   SpO2 97%   BMI 27.63 kg/m  , BMI Body mass index is 27.63 kg/m.  GENERAL:  Well appearing WM in NAD HEENT:  PERRL, EOMI, sclera are clear. Oropharynx is clear. NECK:  No jugular venous distention, carotid upstroke brisk and symmetric, soft left subclavian bruit, no thyromegaly or adenopathy LUNGS:  Clear to auscultation bilaterally CHEST:  Unremarkable HEART:  RRR,   PMI not displaced or sustained,S1 and S2 within normal limits, no S3, no S4: no clicks, no rubs, very soft 1/6 diastolic murmur. ABD:  Soft, nontender. BS +, no masses or bruits. No hepatomegaly, no splenomegaly EXT:  2 + pulses throughout, tr edema, chronic venous stasis changes. SKIN:  Warm and dry.  No rashes NEURO:  Alert and oriented x 3. Cranial nerves II through XII intact. PSYCH:  Cognitively intact   Recent Labs: 03/06/2022: ALT 16; BUN 15; Creatinine, Ser 0.85; Hemoglobin 15.0; Platelets 157.0; Potassium 4.6; Sodium 139; TSH 0.85  Dated 05/08/17: normal CMET and CBC Dated 09/13/21: normal CBC, CMET, Mg  Lipid Panel    Component Value Date/Time   CHOL 131 03/06/2022 0909   CHOL 163 11/30/2019 0827   TRIG 61.0 03/06/2022 0909   HDL 57.20 03/06/2022 0909   HDL 74 11/30/2019 0827   CHOLHDL 2 03/06/2022 0909   VLDL 12.2 03/06/2022 0909   LDLCALC 62 03/06/2022 0909   LDLCALC 75 11/30/2019 0827     Ecg is not done today   Wt Readings from Last 3 Encounters:  04/06/22 209 lb 6.4 oz (95 kg)  03/06/22 205 lb 6.4 oz (93.2 kg)  02/16/22 207 lb 8 oz (94.1 kg)     Other studies Reviewed: Additional studies/ records that were reviewed today include: none.  ETT 03/06/17: Study  Highlights     Blood pressure demonstrated a hypertensive response to exercise. Upsloping ST segment depression ST segment depression of 1 mm was noted during stress in the II, III, aVF, V4 and V5 leads, and returning to baseline after less than 1 minute of recovery.   1. Good exercise tolerance.  2. Upsloping ST depression in inferior leads and V4/V5.  This is nonspecific.    No evidence for ischemia.    Echo 07/08/17: Study Conclusions   - Left ventricle: The cavity size was normal. Wall thickness was   increased in a pattern of moderate LVH. Systolic function was   mildly to moderately reduced. The estimated ejection fraction was   in the range of 40% to 45%. - Aortic valve: There was moderate regurgitation. - Mitral valve: There was moderate regurgitation. - Left atrium: The atrium was moderately dilated. - Right atrium: The atrium was moderately dilated.  TEE 07/17/17: Study Conclusions   - Left ventricle: The estimated ejection fraction was in the range   of 40% to 45%. Mild diffuse hypokinesis with no identifiable   regional variations. No evidence of thrombus. - Aortic valve: There was moderate perivalvular regurgitation. - Mitral valve: No evidence of vegetation. There was moderate to   severe regurgitation, with multiple jets. Diastolic regurgitation   was absent. - Left atrium: The atrium was moderately dilated. No evidence of   thrombus in the atrial cavity or appendage. No evidence of   thrombus in the atrial cavity or appendage. Emptying velocity was   reduced. - Right atrium: The atrium was moderately dilated. - Atrial septum: There was a medium-sized fenestrated patent   foramen ovale. A in the fossa ovalis region was present. There   was a moderate bidirectional shunt through a patent foramen   ovale, in the baseline state. - Tricuspid valve: No evidence of vegetation. There was moderate   regurgitation. - Pulmonic valve: No evidence of vegetation.    Impressions:   - Successful cardioversion. No cardiac source of emboli was   indentified.  Event monitor 07/25/17: Study Highlights   Atrial fibrillation Ventricular rates are frequently elevated Frequent nonsustained ventricular tachycardia as well as aberrantly conducted afib are note No sustained ventricular arrhythmias No prolonged pauses or AV block   Cardiac MRI 10/14/17: Cardiac MRI and thoracic MRA with and without contrast was performed on a 1.5 T MRI scanner to evaluate myocardial morphology, function, viability and assess pulmonary vein anatomy in a patient with hx of GERD, HTN, hypercholesterolemia, OSA on CPAP, CAD s/p CABG (LIMA to LAD, svg to D1, sequential svg to OM and distal LCx, and svg to PDA), atrial flutter s/p ablation, atrial fibrillation, and wide complex tachycardia thought to due to SVT with aberrancy.  Transthoracic echo demonstrated an LVEF of 40-45%, normal cavity size, moderate MR, and moderate AR.  Transesophageal echo demonstrated moderate aortic regurgitation, moderate-severe mitral regurgitation, and a PFO.  Nuclear stress demonstrated a mildly dilated LV with no perfusion defect and an EF of 44%.  The patient is scheduled for ablation for atrial fibrillation.  Cardiac MRI 1. The left ventricle is moderately dilated.  Wall thickness is normal.  Regional and global LV systolic function are low normal.  The LVEF is calculated at 59%.  2. The right ventricle is normal in cavity size and wall thickness.  Global RV systolic function is low normal to mildly reduced.  The RVEF is calculated at 50%.  3. Both atria are severely enlarged.  4. The aortic valve  is trileaflet in morphology. There is no significant aortic stenosis. There is moderate-severe aortic regurgitation.  The regurgitant orifice area measures 0.26 cm^2.  There is reversal of flow in the proximal descending thoracic aorta.  5.  There is  mild-moderate mitral regurgitation.  There is  moderate tricuspid regurgitation.  There is mild pulmonic regurgitation.  6. Delayed enhancement imaging demonstrates no evidence of myocardial infarction, scarring,  or infiltration.  7.  There is no evidence of an intracardiac thrombus.  8.  The pericardium is normal in thickness.  There is no significant pericardial effusion.  Thoracic MRA 1. There are 3 right sided pulmonary veins.  The right lower and right middle pulmonary veins enter the LA immediately adjacent to one another. The right middle vein is small.  The 2 left sided pulmonary veins enter the LA normally.  All veins are patent without significant stenosis proximally.   MRA bi-orthogonal luminal dimensions are listed below: RUPV: 2.0 x 1.8 cm, 76 cm/sec RMPV: 0.6 x 0.5 cm RLPV: 2.0 x 1.7 cm, 60 cm/ sec LUPV: 1.9 x 1.0 cm, 59 cm/ sec LLPV: 1.9 x 0.9 cm, 60 cm/ sec  MRA LA dimensions Head-foot: 7.1 cm Right-left:  6.2 cm Anterior-posterior:  4.3 cm  2. The thoracic aorta is normal in diameter. There is no evidence of a dissection flap.  3. The main and proximal branch pulmonary arteries are normal in size  4. Systemic venous connections are normal.  5. The esophagus is posterior of the left atrium in close proximity to the ostia of the left-sided pulmonary veins.  The descending thoracic aorta is behind the esophagus and behind the left sided pulmonary Veins.  Echo 10/16/17: INTERPRETATION ---------------------------------------------------------------   NORMAL LEFT VENTRICULAR SYSTOLIC FUNCTION WITH MILD LVH   NORMAL LA PRESSURES WITH DIASTOLIC DYSFUNCTION   NORMAL RIGHT VENTRICULAR SYSTOLIC FUNCTION   VALVULAR REGURGITATION: MODERATE AR, MILD MR, TRIVIAL PR, MILD TR   NO VALVULAR STENOSIS   IRREGULAR RHYTHM   Moderate to severe aortic regurgitation. Mild left ventricular enlargement   suggests color Doppler may underestimate aortic regurgitation severity.   AR VENA CONTRACTA=0.5CM   MILD TO MODERATE  TR   NO PRIOR STUDY FOR COMPARISON  Echo 05/02/18: Study Conclusions   - Left ventricle: The cavity size was mildly dilated. Wall   thickness was increased in a pattern of mild LVH. Systolic   function was normal. The estimated ejection fraction was in the   range of 50% to 55%. Wall motion was normal; there were no   regional wall motion abnormalities. Left ventricular diastolic   function parameters were normal. - Aortic valve: There was mild to moderate regurgitation. - Mitral valve: Calcified annulus. There was mild regurgitation. - Left atrium: The atrium was severely dilated. - Tricuspid valve: There was moderate regurgitation. - Pulmonary arteries: Systolic pressure was mildly increased. PA   peak pressure: 41 mm Hg (S).   Impressions:   - Normal LV systolic function; mild LVE and LVH; mild to moderate   AI; mild MR; severe LAE; moderate TR; mild pulmonary   hypertension.  Echo 03/26/19: INTERPRETATION ---------------------------------------------------------------   NORMAL LEFT VENTRICULAR SYSTOLIC FUNCTION   NORMAL LA PRESSURES WITH DIASTOLIC DYSFUNCTION   NORMAL RIGHT VENTRICULAR SYSTOLIC FUNCTION   VALVULAR REGURGITATION: MODERATE AR, MILD MR, TRIVIAL PR, MILD TR   NO VALVULAR STENOSIS   AR VENA CONTRACTA=0.6CM   MILD TO MODERATE TR   NO PRIOR STUDY FOR COMPARISON   Echo 04/11/20: ECHOCARDIOGRAPHIC DESCRIPTIONS -----------------------------------------------  AORTIC  ROOT          Size: MILDLY DILATED    Dissection: INDETERM FOR DISSECTION   AORTIC VALVE      Leaflets: Tricuspid             Morphology: MILDLY THICKENED      Mobility: Fully Mobile   LEFT VENTRICLE                                      Anterior: Normal          Size: MILDLY ENLARGED                        Lateral: Normal   Contraction: Normal                                  Septal: Normal    Closest EF: >55%(Estimated)  Calc.EF: 56% (3D)      Apical: Normal     LV masses: No Masses                              Inferior: Normal           LVH: MILD LVH                             Posterior: Normal   LV GLS(LOL): -18.0%   LV GLS(AVG): -16.6% Normal Range [ <= -16]  Dias.FxClass: N/A   MITRAL VALVE      Leaflets: Normal                  Mobility: Fully mobile    Morphology: Normal   LEFT ATRIUM          Size: MODERATELY ENLARGED     LA masses: No masses                Normal IAS   MAIN PA          Size: DILATED   PULMONIC VALVE    Morphology: Normal      Mobility: Fully Mobile   RIGHT VENTRICLE          Size: Normal                    Free wall: Normal   Contraction: Normal                    RV masses: No Masses         TAPSE:   2.1 cm,  Normal Range [>= 1.6 cm]       RV Note: ANNULAR VELOCITY=10cm/s   TRICUSPID VALVE      Leaflets: Normal                  Mobility: Fully mobile    Morphology: Normal   RIGHT ATRIUM          Size: MODERATELY ENLARGED        RA Other: None     RA masses: No masses   PERICARDIUM         Fluid: No effusion   INFERIOR VENACAVA          Size: DILATED    Normal respiratory collapse   DOPPLER  ECHO and OTHER SPECIAL PROCEDURES ------------------------------------     Aortic: MODERATE AR            No AS      Mitral: MILD MR                No MS     MV Inflow E Vel.= 70.0 cm/s  MV Annulus E'Vel.= 8.5 cm/s  E/E'Ratio= 8   Tricuspid: MILD TR                No TS             2.9 m/s peak TR vel   41 mmHg peak RV pressure   Pulmonary: TRIVIAL PR             No PS       Other:   INTERPRETATION ---------------------------------------------------------------    NORMAL LEFT VENTRICULAR SYSTOLIC FUNCTION WITH MILD LVH    NORMAL RIGHT VENTRICULAR SYSTOLIC FUNCTION    VALVULAR REGURGITATION: MODERATE AR, MILD MR, TRIVIAL PR, MILD TR    NO VALVULAR STENOSIS    MILDLY DILATED LEFT VENTRICLE SUGGESTS POSSIBILITY THAT ECHOCARDIOGRAPHY    UNDERESTIMATES AORTIC REGURGITATION SEVERITY.    AR VENA CONTRACTA=0.6cm    3D acquisition and  reconstructions were performed as part of this    examination to more accurately quantify the effects of moderate or greater    valve regurgitation. (post-processing on an Independent workstation).     Compared with prior Echo study on 03/26/2019: NO SIGNIFICANT CHANGES.    Event monitor 12/07/20: Study Highlights  Atrial fibrillation with RVR and intermittent aberrancy. Patient had returned to NSR on 12/01/20    Echo 07/04/21: INTERPRETATION ---------------------------------------------------------------    NORMAL LEFT VENTRICULAR SYSTOLIC FUNCTION WITH MILD LVH    NORMAL RIGHT VENTRICULAR SYSTOLIC FUNCTION    VALVULAR REGURGITATION: MODERATE AR, MILD MR, MILD PR, MILD TR    NO VALVULAR STENOSIS    IRREGULAR RHYTHM THROUGHOUT EXAM    AR VENA CONTRACTA = 0.5 cm    3D acquisition and reconstructions were performed as part of this    examination to more accurately quantify the effects of identified    structural abnormalities as part of the exam. (post-processing on an    Independent workstation).     Compared with prior Echo study on 04/11/2020: MILD TO MODERATE TR    SEVERE BIATRIAL ENLARGEMENT. INCREASED RV DIMENSIONS    NO SIGNIFICANT CHANGE IN AR OR LV FUNCTION    Cardiac MRI 09/11/21: SUMMARY   ==========================================================================================================   Cardiac MRI and thoracic MRA were performed  with and without contrast on a 3.0 T MRI scanner to evaluate  myocardial morphology, function, viability and pulmonary vein anatomy in a 71 y/o patient with hx of GERD,  HTN, hypercholesterolemia, OSA on CPAP, CAD s/p CABG (LIMA to LAD, svg to D1, sequential svg to OM and  distal LCx, and svg to PDA), atrial flutter s/p ablation, PFO, atrial fibrillation, wide complex  tachycardia thought to due to SVT with aberrancy, aortic regurgitation, and mitral regurgitation who is  scheduled for afib ablation.  Comparisons were made to the prior  scan from 10/14/2017.   The cardiac rhythm was atrial fibrillation. Real time imaging was used.  Regurgitant valvular lesions could  not be fully assessed due to the use of real time imaging.  Comprehensive assessment could be performed  following conversion to NSR.   Cardiac MRI  1. The left ventricle is moderately dilated.  Wall thickness is normal.  There are no regional wall motion  abnormalities. During atrial fibrillation, there is low normal to mildly reduced function with an LVEF  visually estimated at 50-55%.   2. The right ventricle is normal in cavity size and wall thickness.  Global RV systolic function is low  normal to mildly reduced.   3. Both atria are severely enlarged.   4. The aortic valve is trileaflet in morphology. There is no significant aortic stenosis. There is at least  moderate-severe aortic regurgitation.   5.  There is at least mild-moderate mitral regurgitation.  There is at least moderate tricuspid  regurgitation. There is mild pulmonic regurgitation.   6. Delayed enhancement imaging demonstrates no evidence of myocardial infarction, scarring,  or  infiltration.   7.  There is no evidence of an intracardiac thrombus.   8.  The pericardium is normal in thickness.  There is no significant pericardial effusion.   Thoracic MRA:  1. There are 3 right sided pulmonary veins.  The right lower and right middle pulmonary veins enter the LA  immediately adjacent to one another. The right middle vein is small.   The 2 left sided pulmonary veins enter the LA normally.   All veins are patent without significant stenosis proximally.   MRA bi-orthogonal luminal dimensions are listed below:  RUPV: 2.1 x 1.9 cm (prior 2.0 x 1.8 cm, 76 cm/sec)  RMPV: 0.7 x 0.6 cm (prior 0.6 x 0.5 cm)  RLPV: 2.1 x 1.7 cm (prior 2.0 x 1.7 cm, 60 cm/ sec)  LUPV: 1.9 x 1.1 cm (prior 1.9 x 1.0 cm, 59 cm/ sec)  LLPV: 1.7 x 1.0 cm (prior 1.9 x 0.9 cm, 60 cm/ sec)   MRA LA dimensions   Head-foot: 6.9 cm  Right-left: 6.8 cm  Anterior-posterior: 4.5 cm   2. The thoracic aorta is normal in diameter. There is no evidence of a dissection flap.   3. The main and proximal branch pulmonary arteries are normal in size   4. Systemic venous connections are normal.   5. The esophagus traverses posterior to ostia of the left-sided pulmonary veins.  The descending thoracic  aorta is behind the esophagus and behind the left sided pulmonary veins.   ASSESSMENT AND PLAN:  1. Atrial fibriillation with RVR. Prior history of atrial flutter and atrial tachycardia ablation in 2015. On anticoagulation with Eliquis 5 mg bid. [place] vasc score of 3. On  Toprol XL.   Failed Tikosyn due to QT prolongation. Now s/p repeat Afib ablation at [facility] on 10/15/17. Recurrent Afib in June 2020 with repeat DCCV. Recurrent Afib with another Ablation at [facility] in Feb 2023. Maintaining NSR   2. Coronary disease status post CABG in 2005. He is without chest pain. He had a normal Myoview study September 2015. Low risk ETT in August 2018. Repeat nuclear  study at [facility] 2019 showed no ischemia. EF 44%.   3. Chronic Aortic insufficiency- moderate to severe.  Pulse pressure normal. No symptoms. Repeat Echo in Dec  at [facility] showed no change.MRI stable from 2019. Planning follow up again with Echo at [facility] in January   4. 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   5. Atrial flutter s/p ablation.   6. OSA- now on CPAP. Followed by Dr. Radford Pax.  7. Hyperlipidemia on statin. Last labs in August looked great.   8. HTN - BP is under good control  9. Left subclavian stenosis. Asymptomatic.     Disposition: FU with me in 6 months  Signed, Sunjai Levandoski [place], MD  04/06/2022 9:15 AM    [facility]

## 2022-04-06 ENCOUNTER — Encounter: Payer: Self-pay | Admitting: Cardiology

## 2022-04-06 ENCOUNTER — Ambulatory Visit: Payer: Medicare HMO | Attending: Cardiology | Admitting: Cardiology

## 2022-04-06 VITALS — BP 118/62 | HR 61 | Ht 73.0 in | Wt 209.4 lb

## 2022-04-06 DIAGNOSIS — I4891 Unspecified atrial fibrillation: Secondary | ICD-10-CM | POA: Diagnosis not present

## 2022-04-06 DIAGNOSIS — I2510 Atherosclerotic heart disease of native coronary artery without angina pectoris: Secondary | ICD-10-CM | POA: Diagnosis not present

## 2022-04-06 DIAGNOSIS — G4733 Obstructive sleep apnea (adult) (pediatric): Secondary | ICD-10-CM | POA: Diagnosis not present

## 2022-04-06 DIAGNOSIS — Z951 Presence of aortocoronary bypass graft: Secondary | ICD-10-CM | POA: Diagnosis not present

## 2022-04-06 DIAGNOSIS — I351 Nonrheumatic aortic (valve) insufficiency: Secondary | ICD-10-CM | POA: Diagnosis not present

## 2022-04-09 ENCOUNTER — Ambulatory Visit: Payer: Medicare HMO | Admitting: Psychologist

## 2022-04-09 DIAGNOSIS — R69 Illness, unspecified: Secondary | ICD-10-CM | POA: Diagnosis not present

## 2022-04-09 DIAGNOSIS — F341 Dysthymic disorder: Secondary | ICD-10-CM | POA: Diagnosis not present

## 2022-04-09 NOTE — Progress Notes (Signed)
[place] Counselor/Therapist Progress Note  Patient ID: COBY SHREWSBERRY, MRN: [id number],    Date: 04/09/2022  Time Spent: 2:03 pm to 02:43 pm; total time: 40 minutes  This session was held via video webex teletherapy due to the coronavirus risk at this time. The patient consented to video teletherapy and was located at his home during this session. He is aware it is the responsibility of the patient to secure confidentiality on his end of the session. The provider was in a private home office for the duration of this session. Limits of confidentiality were discussed with the patient.    Treatment Type: Individual Therapy  Reported Symptoms: Mild distress related to Parkinson's disease  Mental Status Exam: Appearance:  Well Groomed     Behavior: Appropriate  Motor: Normal  Speech/Language:  Clear and Coherent  Affect: Appropriate  Mood: normal  Thought process: normal  Thought content:   WNL  Sensory/Perceptual disturbances:   WNL  Orientation: oriented to person, place, and time/date  Attention: Good  Concentration: Good  Memory: WNL  Fund of knowledge:  Good  Insight:   Fair  Judgment:  Fair  Impulse Control: Good   Risk Assessment: Danger to Self:  No Self-injurious Behavior: No Danger to Others: No Duty to Warn:no Physical Aggression / Violence:No  Access to Firearms a concern: No  Gang Involvement:No   Subjective: Beginning the session, patient described himself as okay while talking about events since the last session. He goes to [facility] on Thursday to get a second opinion. From there,e he voiced that he is participating in exercises classes, which he enjoys. He think talked about getting "stuck" due to thoughts regarding Parkinson's disease. After participating in defusion, he voiced that he was doing better. He then processed the idea of moving forward and not allowing Parkinson's to define him. He was agreeable to homework and following up. He  denied suicidal and homicidal ideation.    Interventions:  Worked on developing a therapeutic relationship with the patient using active listening and reflective statements. Provided emotional support using empathy and validation. Reflected on events since the last session. Used summary and reflective statements. Praised patient for participating in physical activities. Normalized and validated emotions. Challenged some of the thoughts expressed. Explored the difference between Parkinson's and heart disease. Used solution focused therapy. Used socratic questions to assist the patient. Provided psychoeducation about defusion. Praised and processed defusion. Praised patient for experiencing less distress. Provided psychoeducation about functionality of thoughts. Processed thoughts. Used metaphors to assist the patient . Provided empathic statements. Assessed for suicidal and homicidal ideation.   Homework: Implement defusion  Next Session: Review homework. Discuss appointment with second neurologist. Emotional support.   Diagnosis: F34.1 persistent depressive disorder   Plan:   Client Abilities: Friendly and easy to develop rapport  Client Preferences: ACT and CBT  Client statement of Needs: Work towards motivation  Treatment Level: Outpatient  Goals Work through the grieving process and face reality of own death Accept emotional support from others around them Live life to the fullest, event though time may be limited Become as knowledgeable about the medical condition  Reduce fear, anxiety about the health condition  Accept the illness Accept the role of psychological and behavioral factors  Stabilize anxiety level wile increasing ability to function Learn and implement coping skills that result in a reduction of anxiety  Alleviate depressive symptoms Recognize, accept, and cope with depressive feelings Develop healthy thinking patterns Develop healthy interpersonal  relationships  Objectives target date for all objectives is 09/19/2022 Identify feelings associated with the illness Family members share with each other feelings Identify the losses or limitations that have been experienced Verbalize acceptance of the reality of the medical condition Commit to learning and implement a proactive approach to managing personal stresses Verbalize an understanding of the medical condition Work with therapist to develop a plan for coping with stress Learn and implement skills for managing stress Engage in social, productive activities that are possible Engage in faith based activities implement positive imagery Identify coping skills and sources of emotional support Patient's partner and family members verbalize their fears regarding severity of health condition Identify sources of emotional distress  Learning and implement calming skills to reduce overall anxiety Learn and implement problem solving strategies Identify and engage in pleasant activities Learning and implement personal and interpersonal skills to reduce anxiety and improve interpersonal relationships Learn to accept limitations in life and commit to tolerating, rather than avoiding, unpleasant emotions while accomplishing meaningful goals Identify major life conflicts from the past and present that form the basis for present anxiety Learn and implement behavioral strategies Verbalize an understanding and resolution of current interpersonal problems Learn and implement problem solving and decision making skills Learn and implement conflict resolution skills to resolve interpersonal problems Verbalize an understanding of healthy and unhealthy emotions verbalize insight into how past relationships may be influence current experiences with depression Use mindfulness and acceptance strategies and increase value based behavior  Increase hopeful statements about the future.   Interventions Teach  about stress and ways to handle stress Assist the patient in developing a coping action plan for stressors Conduct skills based training for coping strategies Train problem focused skills Sort out what activities the individual can do Encourage patient to rely upon his/her spiritual faith Teach the patient to use guided imagery Probe and evaluate family's ability to provide emotional support Allow family to share their fears Assist the patient in identifying, sorting through, and verbalizing the various feelings generated by his/her medical condition Meet with family members  Ask patient list out limitations  Use stress inoculation training  Use Acceptance and Commitment Therapy to help client accept uncomfortable realities in order to accomplish value-consistent goals Reinforce the client's insight into the role of his/her past emotional pain and present anxiety  Discuss examples demonstrating that unrealistic worry overestimates the probability of threats and underestimate patient's ability  Assist the patient in analyzing his or her worries Help patient understand that avoidance is reinforcing  Behavioral activation help the client explore the relationship, nature of the dispute,  Help the client develop new interpersonal skills and relationships Conduct Problem so living therapy Teach conflict resolution skills Use a process-experiential approach Conduct TLDP Conduct ACT  The patient and clinician reviewed the treatment plan on 10/12/2021. The patient approved of the treatment plan.    [name], PsyD

## 2022-04-12 DIAGNOSIS — R251 Tremor, unspecified: Secondary | ICD-10-CM | POA: Diagnosis not present

## 2022-05-07 ENCOUNTER — Ambulatory Visit: Payer: Medicare HMO | Admitting: Psychologist

## 2022-05-07 DIAGNOSIS — R69 Illness, unspecified: Secondary | ICD-10-CM | POA: Diagnosis not present

## 2022-05-07 DIAGNOSIS — F341 Dysthymic disorder: Secondary | ICD-10-CM

## 2022-05-07 NOTE — Progress Notes (Signed)
[place] Counselor/Therapist Progress Note  Patient ID: [name] [name], MRN: [id number],    Date: 05/07/2022  Time Spent: 2:05 pm to 02:30 pm; total time: 25  minutes  This session was held via video webex teletherapy due to the coronavirus risk at this time. The patient consented to video teletherapy and was located at his home during this session. He is aware it is the responsibility of the patient to secure confidentiality on his end of the session. The provider was in a private home office for the duration of this session. Limits of confidentiality were discussed with the patient.    Treatment Type: Individual Therapy  Reported Symptoms: Procrastination   Mental Status Exam: Appearance:  Well Groomed     Behavior: Appropriate  Motor: Normal  Speech/Language:  Clear and Coherent  Affect: Appropriate  Mood: normal  Thought process: normal  Thought content:   WNL  Sensory/Perceptual disturbances:   WNL  Orientation: oriented to person, place, and time/date  Attention: Good  Concentration: Good  Memory: WNL  Fund of knowledge:  Good  Insight:   Fair  Judgment:  Fair  Impulse Control: Good   Risk Assessment: Danger to Self:  No Self-injurious Behavior: No Danger to Others: No Duty to Warn:no Physical Aggression / Violence:No  Access to Firearms a concern: No  Gang Involvement:No   Subjective: Beginning the session, patient described himself as well and indicated that the second neurologist he met with was unable to determine whether he did not did not meet criteria for a Parkinson's diagnosis, which made the patient feel better. He then spent the rest of the time reflecting on identifying behavioral steps to assist with procrastination. He was agreeable to homework and following up. He denied suicidal and homicidal ideation.    Interventions:  Worked on developing a therapeutic relationship with the patient using active listening and reflective  statements. Provided emotional support using empathy and validation. Reflected on events since the last session. Used summary and reflective statements. Reviewed the appointment with the neurologist. Processed thoughts and emotions. Praised the patient for receiving positive news. Explored how exercise may be assisting the patient. Identified goals for the session. Identified the theme of procrastination. Assisted in ways to overcome procrastination. Used socratic questions. Provided psychoeducation about reinforcement.  Provided empathic statements. Assigned homework. Assessed for suicidal and homicidal ideation.   Homework: Implement behavioral strategies for overcoming procrastination.   Next Session: Review homework. Emotional support  Diagnosis: F34.1 persistent depressive disorder   Plan:   Client Abilities: Friendly and easy to develop rapport  Client Preferences: ACT and CBT  Client statement of Needs: Work towards motivation  Treatment Level: Outpatient  Goals Work through the grieving process and face reality of own death Accept emotional support from others around them Live life to the fullest, event though time may be limited Become as knowledgeable about the medical condition  Reduce fear, anxiety about the health condition  Accept the illness Accept the role of psychological and behavioral factors  Stabilize anxiety level wile increasing ability to function Learn and implement coping skills that result in a reduction of anxiety  Alleviate depressive symptoms Recognize, accept, and cope with depressive feelings Develop healthy thinking patterns Develop healthy interpersonal relationships  Objectives target date for all objectives is 09/19/2022 Identify feelings associated with the illness Family members share with each other feelings Identify the losses or limitations that have been experienced Verbalize acceptance of the reality of the medical condition Commit  to  learning and implement a proactive approach to managing personal stresses Verbalize an understanding of the medical condition Work with therapist to develop a plan for coping with stress Learn and implement skills for managing stress Engage in social, productive activities that are possible Engage in faith based activities implement positive imagery Identify coping skills and sources of emotional support Patient's partner and family members verbalize their fears regarding severity of health condition Identify sources of emotional distress  Learning and implement calming skills to reduce overall anxiety Learn and implement problem solving strategies Identify and engage in pleasant activities Learning and implement personal and interpersonal skills to reduce anxiety and improve interpersonal relationships Learn to accept limitations in life and commit to tolerating, rather than avoiding, unpleasant emotions while accomplishing meaningful goals Identify major life conflicts from the past and present that form the basis for present anxiety Learn and implement behavioral strategies Verbalize an understanding and resolution of current interpersonal problems Learn and implement problem solving and decision making skills Learn and implement conflict resolution skills to resolve interpersonal problems Verbalize an understanding of healthy and unhealthy emotions verbalize insight into how past relationships may be influence current experiences with depression Use mindfulness and acceptance strategies and increase value based behavior  Increase hopeful statements about the future.   Interventions Teach about stress and ways to handle stress Assist the patient in developing a coping action plan for stressors Conduct skills based training for coping strategies Train problem focused skills Sort out what activities the individual can do Encourage patient to rely upon his/her spiritual faith Teach the  patient to use guided imagery Probe and evaluate family's ability to provide emotional support Allow family to share their fears Assist the patient in identifying, sorting through, and verbalizing the various feelings generated by his/her medical condition Meet with family members  Ask patient list out limitations  Use stress inoculation training  Use Acceptance and Commitment Therapy to help client accept uncomfortable realities in order to accomplish value-consistent goals Reinforce the client's insight into the role of his/her past emotional pain and present anxiety  Discuss examples demonstrating that unrealistic worry overestimates the probability of threats and underestimate patient's ability  Assist the patient in analyzing his or her worries Help patient understand that avoidance is reinforcing  Behavioral activation help the client explore the relationship, nature of the dispute,  Help the client develop new interpersonal skills and relationships Conduct Problem so living therapy Teach conflict resolution skills Use a process-experiential approach Conduct TLDP Conduct ACT  The patient and clinician reviewed the treatment plan on 10/12/2021. The patient approved of the treatment plan.    [name], PsyD

## 2022-05-09 ENCOUNTER — Other Ambulatory Visit: Payer: Self-pay | Admitting: Cardiology

## 2022-05-09 NOTE — Telephone Encounter (Signed)
Prescription refill request for Eliquis received. Indication: AF Last office visit: 04/06/22  P Martin[place] MD Scr: 0.85 on 03/06/22 Age:  71 Weight: 95kg  Based on above findings Eliquis '5mg'$  twice daily is the appropriate dose.  Refill approved.

## 2022-05-31 NOTE — Progress Notes (Signed)
Assessment/Plan:   1.  Parkinsonism, very mild  -Patient sought second opinion at [facility].  Because of the mild nature of symptoms, they were not convinced patient had Parkinson's disease.  Both here and at [facility], patient had declined DaTscan and skin biopsies.  We discussed those again today.  He would like to hold on these things, as we really would not do anything differently.  He is exercising regularly and things are so mild that we really would likely not add medicine.  -Discussed with patient that he does not need 2 different movement disorder specialist.  He has an another follow-up appointment at [facility] in April, 2024.  He will need to choose where he would like to stay.  Either place is okay with me.  2.  Mild dysphagia, maybe 1 time per week per wife  -schedule MBE.    Subjective:   [name] [name] was seen today in follow up for Parkinsons disease, very mild and early.  Patient with wife who supplements history.  My previous records were reviewed prior to todays visit as well as outside records available to me.  We decided to hold off on any medications last visit.  We offered a second opinion, but he could declined that.  He changed his mind apparently and sought care at [facility].  He saw Dr. Laurance Flatten on September 21.  She noted that "his exam is not clear-cut today."  She did not feel that the patient clearly had Parkinson's disease, but did discuss DaTscan and skin biopsy, which were declined (also declined here last visit).  He decided to make a follow-up with her in 6 months.  He has been following with Dr. Michail Sermon for counseling.  He is exercising and still doing rsb.  No falls.  Tremor is in the L>R hand.  Tremor is stable.  He is having some trouble with swallowing liquids at meals.  He will cough per wife.  She notes it with more frequency.  Current prescribed movement disorder medications: None   ALLERGIES:  No Known Allergies  CURRENT MEDICATIONS:  Current Meds  Medication Sig    acetaminophen (TYLENOL) 325 MG tablet Take 2 tablets (650 mg total) by mouth every 6 (six) hours as needed for up to 30 doses for mild pain or moderate pain.   apixaban (ELIQUIS) 5 MG TABS tablet TAKE 1 TABLET BY MOUTH TWICE A DAY   atorvastatin (LIPITOR) 80 MG tablet TAKE 1 TABLET BY MOUTH EVERY DAY   cholecalciferol (VITAMIN D) 1000 UNITS tablet Take 1,000 Units by mouth daily.   Coenzyme Q10 (CO Q 10 PO) Take 100 mg by mouth daily.    colchicine 0.6 MG tablet TAKE 1 TABLET BY MOUTH TWICE A DAY (Patient taking differently: Take 0.6 mg by mouth daily as needed (gout).)   furosemide (LASIX) 20 MG tablet TAKE 1 TABLET BY MOUTH TWICE A DAY   losartan (COZAAR) 50 MG tablet TAKE 1 TABLET BY MOUTH EVERY DAY   methotrexate 2.5 MG tablet Take 2.5 mg by mouth once a week. Caution:Chemotherapy. Protect from light. Take on Tuesdays   metoprolol succinate (TOPROL-XL) 50 MG 24 hr tablet Take 75 mg by mouth daily. Take with or immediately following a meal.   multivitamin (THERAGRAN) per tablet Take 1 tablet by mouth daily.   potassium chloride SA (K-DUR,KLOR-CON) 20 MEQ tablet Take 1 tablet (20 mEq total) by mouth daily.   triamcinolone cream (KENALOG) 0.1 % SMARTSIG:1 Application Topical 2-3 Times Daily  Objective:   PHYSICAL EXAMINATION:    VITALS:   Vitals:   06/05/22 1039  BP: 135/70  Pulse: 65  Resp: 20  SpO2: 99%  Weight: 210 lb (95.3 kg)  Height: '6\' 1"'$  (1.854 m)    GEN:  The patient appears stated age and is in NAD. HEENT:  Normocephalic, atraumatic.  The mucous membranes are moist.   Neurological examination:  Orientation: The patient is alert and oriented x3. Cranial nerves: There is good facial symmetry without facial hypomimia. The speech is fluent and clear. Soft palate rises symmetrically and there is no tongue deviation. Hearing is intact to conversational tone. Sensation: Sensation is intact to light touch throughout Motor: Strength is at least antigravity  x4.  Movement examination: Tone: There is slight increased tone in the right upper extremity, but tone elsewhere is normal.  The tone in the lower extremities is good.  This is stable from previous. Abnormal movements: there is left upper extremity rest tremor with distraction only Coordination:  There is no significant decremation today with rapid alternating movements. Gait and Station: The patient has no difficulty arising out of a deep-seated chair without the use of the hands. The patient's stride length is good with LUE rest tremor.    I have reviewed and interpreted the following labs independently    Chemistry      Component Value Date/Time   NA 139 03/06/2022 0909   NA 141 09/13/2021 0000   K 4.6 03/06/2022 0909   CL 102 03/06/2022 0909   CO2 29 03/06/2022 0909   BUN 15 03/06/2022 0909   BUN 8 09/13/2021 0000   CREATININE 0.85 03/06/2022 0909   CREATININE 0.79 02/26/2020 0916   GLU 102 09/13/2021 0000      Component Value Date/Time   CALCIUM 9.7 03/06/2022 0909   ALKPHOS 74 03/06/2022 0909   AST 20 03/06/2022 0909   ALT 16 03/06/2022 0909   BILITOT 1.1 03/06/2022 0909   BILITOT 1.2 11/30/2019 0827       Lab Results  Component Value Date   WBC 5.1 03/06/2022   HGB 15.0 03/06/2022   HCT 44.3 03/06/2022   MCV 91.7 03/06/2022   PLT 157.0 03/06/2022    Lab Results  Component Value Date   TSH 0.85 03/06/2022     Total time spent on today's visit was 24 minutes, including both face-to-face time and nonface-to-face time.  Time included that spent on review of records (prior notes available to me/labs/imaging if pertinent), discussing treatment and goals, answering patient's questions and coordinating care.  Cc:  [name], MD

## 2022-06-05 ENCOUNTER — Ambulatory Visit: Payer: Medicare HMO | Admitting: Neurology

## 2022-06-05 ENCOUNTER — Encounter: Payer: Self-pay | Admitting: Neurology

## 2022-06-05 VITALS — BP 135/70 | HR 65 | Resp 20 | Ht 73.0 in | Wt 210.0 lb

## 2022-06-05 DIAGNOSIS — G20C Parkinsonism, unspecified: Secondary | ICD-10-CM | POA: Diagnosis not present

## 2022-06-05 DIAGNOSIS — R1319 Other dysphagia: Secondary | ICD-10-CM

## 2022-06-06 ENCOUNTER — Telehealth: Payer: Self-pay | Admitting: Neurology

## 2022-06-06 ENCOUNTER — Other Ambulatory Visit (HOSPITAL_COMMUNITY): Payer: Self-pay

## 2022-06-06 ENCOUNTER — Telehealth (HOSPITAL_COMMUNITY): Payer: Self-pay

## 2022-06-06 DIAGNOSIS — R1310 Dysphagia, unspecified: Secondary | ICD-10-CM

## 2022-06-06 NOTE — Telephone Encounter (Signed)
Abigail Butts from [facility] called and left a VM they received some paperwork. They need a signed prescription to start services and a demographic sheet sent before they can see the patient.

## 2022-06-06 NOTE — Telephone Encounter (Signed)
Attempted to contact patient to schedule Modified Barium Swallow - left voicemail. 

## 2022-06-08 ENCOUNTER — Telehealth: Payer: Self-pay | Admitting: Neurology

## 2022-06-08 NOTE — Telephone Encounter (Signed)
Daniel from [facility] called in needing a signed script from Dr. Carles Collet for PT and OT so they can get the patient scheduled

## 2022-06-11 NOTE — Telephone Encounter (Signed)
This has been faxed multiples times, I will speak with [facility].

## 2022-06-11 NOTE — Telephone Encounter (Signed)
[name] [name] from [facility] called in stating she wanted to let our office know they did receive the signed script and everything is good to go.

## 2022-06-11 NOTE — Telephone Encounter (Signed)
[name] [name] from [facility] called in again needing a signed script from Dr. Carles Collet for PT and OT. The pt keeps calling them trying to get scheduled.

## 2022-06-12 ENCOUNTER — Encounter: Payer: Self-pay | Admitting: Neurology

## 2022-06-13 ENCOUNTER — Telehealth: Payer: Self-pay | Admitting: Pharmacist

## 2022-06-13 NOTE — Chronic Care Management (AMB) (Signed)
Chronic Care Management Pharmacy Assistant   Name: [name] [name]  MRN: [id number] DOB: 04/08/1951  Reason for Encounter: Disease State / Hypertension Assessment Call   Recent office visits:  03/06/2022 [name] MD - Patient was seen for physical exam and an additional concerns. No medication changes. No follow up noted.   02/16/2022 [name] LPN - medicare annual wellness exam and an additional issue.   01/19/2022 [name] MD - Patient was seen for essential hypertension and additional concerns. No additional chart notes.   Recent consult visits:  06/05/2022 [name] DO (neurology) - Patient was seen for Esophageal dysphagia and an additional concerns. No medication changes. No follow up noted.   04/12/2022 [name] MD ([facility]) - Patient was seen for tremor. No medication changes. No follow up noted.   04/09/2022 [name] PsyD(psychology) - Patient was seen for persistent depressive disorder. No medication changes.   04/06/2022 [name] MD(cardiology) - Patient was seen for Coronary artery disease involving native coronary artery of native heart without angina pectoris and additional concerns. No medication changes. Follow up in 6 months.   04/03/2022 [name] (derm) - Patient was seen for Melanocytic nevi of trunk and additional concerns. No additional chart notes.   02/22/2022 [name] MD ([facility]) - Patient was seen for persistent atrial fibrillation. No medication changes. No follow up noted.   01/05/2022 [name] PA-C - Patient was seen for pain in left elbow and additional concerns. No additional chart notes.   Hospital visits:  None  Medications: Outpatient Encounter Medications as of 06/13/2022  Medication Sig   acetaminophen (TYLENOL) 325 MG tablet Take 2 tablets (650 mg total) by mouth every 6 (six) hours as needed for up to 30 doses for mild pain or moderate pain.   apixaban (ELIQUIS) 5  MG TABS tablet TAKE 1 TABLET BY MOUTH TWICE A DAY   atorvastatin (LIPITOR) 80 MG tablet TAKE 1 TABLET BY MOUTH EVERY DAY   cholecalciferol (VITAMIN D) 1000 UNITS tablet Take 1,000 Units by mouth daily.   Coenzyme Q10 (CO Q 10 PO) Take 100 mg by mouth daily.    colchicine 0.6 MG tablet TAKE 1 TABLET BY MOUTH TWICE A DAY (Patient taking differently: Take 0.6 mg by mouth daily as needed (gout).)   furosemide (LASIX) 20 MG tablet TAKE 1 TABLET BY MOUTH TWICE A DAY   losartan (COZAAR) 50 MG tablet TAKE 1 TABLET BY MOUTH EVERY DAY   methotrexate 2.5 MG tablet Take 2.5 mg by mouth once a week. Caution:Chemotherapy. Protect from light. Take on Tuesdays   metoprolol succinate (TOPROL-XL) 50 MG 24 hr tablet Take 75 mg by mouth daily. Take with or immediately following a meal.   multivitamin (THERAGRAN) per tablet Take 1 tablet by mouth daily.   nitroGLYCERIN (NITROSTAT) 0.4 MG SL tablet Place 1 tablet (0.4 mg total) under the tongue every 5 (five) minutes as needed for chest pain. (Patient not taking: Reported on 06/05/2022)   potassium chloride SA (K-DUR,KLOR-CON) 20 MEQ tablet Take 1 tablet (20 mEq total) by mouth daily.   triamcinolone cream (KENALOG) 0.1 % SMARTSIG:1 Application Topical 2-3 Times Daily   No facility-administered encounter medications on file as of 06/13/2022.  Fill History:   Dispensed Days Supply Quantity Provider Pharmacy  [facility] PAIN RELIEF 325 MG TABLET 11/16/2020 30 100 each      Dispensed Days Supply Quantity Provider Pharmacy  ELIQUIS '5MG'$  TAB 06/08/2022 30 60 tablet  Dispensed Days Supply Quantity Provider Pharmacy  ATORVASTATIN 80 MG TABLET 04/16/2022 90 90 each      Dispensed Days Supply Quantity Provider Pharmacy  COLCHICINE 0.6 MG TABLET 04/13/2021 90 180 each      Dispensed Days Supply Quantity Provider Pharmacy  FUROSEMIDE 20 MG TABLET 04/16/2022 90 180 each      Dispensed Days Supply Quantity Provider Pharmacy  LOSARTAN POTASSIUM 50 MG TAB 05/14/2022 90 90  each      Dispensed Days Supply Quantity Provider Pharmacy  METOPROLOL SUCC ER 50 MG TAB 05/04/2022 90 135 each      Dispensed Days Supply Quantity Provider Pharmacy  NITROGLYCERIN 0.4 MG TABLET SL 03/22/2022 5 25 each      Dispensed Days Supply Quantity Provider Pharmacy  TRIAMCINOLONE 0.1% CREAM 04/03/2022 30 454 g      Reviewed chart prior to disease state call. Spoke with patient regarding BP  Recent Office Vitals: BP Readings from Last 3 Encounters:  06/05/22 135/70  04/06/22 118/62  03/06/22 110/60   Pulse Readings from Last 3 Encounters:  06/05/22 65  04/06/22 61  03/06/22 (!) 58    Wt Readings from Last 3 Encounters:  06/05/22 210 lb (95.3 kg)  04/06/22 209 lb 6.4 oz (95 kg)  03/06/22 205 lb 6.4 oz (93.2 kg)     Kidney Function Lab Results  Component Value Date/Time   CREATININE 0.85 03/06/2022 09:09 AM   CREATININE 1.00 09/18/2021 02:29 PM   CREATININE 0.79 02/26/2020 09:16 AM   GFR 86.18 03/06/2022 09:09 AM   GFRNONAA >60 10/19/2020 02:50 PM   GFRAA 90 08/27/2019 09:09 AM       Latest Ref Rng & Units 03/06/2022    9:09 AM 09/18/2021    2:29 PM 09/13/2021   12:00 AM  BMP  Glucose 70 - 99 mg/dL 97  107    BUN 6 - 23 mg/dL '15  18  8      '$ Creatinine 0.40 - 1.50 mg/dL 0.85  1.00  0.9      Sodium 135 - 145 mEq/L 139  137  141      Potassium 3.5 - 5.1 mEq/L 4.6  3.8  4.4      Chloride 96 - 112 mEq/L 102  104  105      CO2 19 - 32 mEq/L '29  28  28      '$ Calcium 8.4 - 10.5 mg/dL 9.7  9.6  9.2         This result is from an external source.    Current antihypertensive regimen:  Losartan 50 mg daily Metoprolol 50 mg take 75 mg daily  How often are you checking your Blood Pressure?   Current home BP readings:   What recent interventions/DTPs have been made by any provider to improve Blood Pressure control since last CPP Visit: No recent interventions  Any recent hospitalizations or ED visits since last visit with CPP? No recent hospital visit  What  diet changes have been made to improve Blood Pressure Control?  Patient follows Breakfast - patient Lunch - patient Dinner - patient  What exercise is being done to improve your Blood Pressure Control?    Adherence Review: Is the patient currently on ACE/ARB medication? Yes Does the patient have >5 day gap between last estimated fill dates? No  Unable to reach patient after several attempts.   Care Gaps: AWV - scheduled 02/20/2023 Last BP - 135/70 on 06/05/2022 Covid - overdue Colonoscopy - postponed  Star  Rating Drugs: Atorvastatin 80 mg - last filled [facility] Losartan 50 mg - last filled 05/14/2022 90 DS at [facility] Pharmacist Assistant [phone]

## 2022-06-18 ENCOUNTER — Ambulatory Visit: Payer: Medicare HMO | Admitting: Psychologist

## 2022-06-18 DIAGNOSIS — F341 Dysthymic disorder: Secondary | ICD-10-CM

## 2022-06-18 DIAGNOSIS — R69 Illness, unspecified: Secondary | ICD-10-CM | POA: Diagnosis not present

## 2022-06-18 NOTE — Progress Notes (Signed)
[place] Counselor/Therapist Progress Note  Patient ID: [name] [name], MRN: [id number],    Date: 06/18/2022  Time Spent: 2:07 pm to 02:41 pm; total time: 34 minutes  This session was held via video webex teletherapy due to the coronavirus risk at this time. The patient consented to video teletherapy and was located at his home during this session. He is aware it is the responsibility of the patient to secure confidentiality on his end of the session. The provider was in a private home office for the duration of this session. Limits of confidentiality were discussed with the patient.    Treatment Type: Individual Therapy  Reported Symptoms: Procrastination   Mental Status Exam: Appearance:  Well Groomed     Behavior: Appropriate  Motor: Normal  Speech/Language:  Clear and Coherent  Affect: Appropriate  Mood: normal  Thought process: normal  Thought content:   WNL  Sensory/Perceptual disturbances:   WNL  Orientation: oriented to person, place, and time/date  Attention: Good  Concentration: Good  Memory: WNL  Fund of knowledge:  Good  Insight:   Fair  Judgment:  Fair  Impulse Control: Good   Risk Assessment: Danger to Self:  No Self-injurious Behavior: No Danger to Others: No Duty to Warn:no Physical Aggression / Violence:No  Access to Firearms a concern: No  Gang Involvement:No   Subjective: Beginning the session, patient described himself as well indicating that he currently is not experiencing symptoms associated with Parkinson's disease. He processed thoughts related to this. From there, he voiced that he still experiences challenges with procrastination. Patient explored reasons to make changes to his behavior and reinforcement factors for making changes. He was agreeable to homework and following up. He denied suicidal and homicidal ideation.    Interventions:  Worked on developing a therapeutic relationship with the patient using active  listening and reflective statements. Provided emotional support using empathy and validation. Reflected on events since the last session. Praised the patient for the positive news. Processed thoughts and emotions with the positive news. Explored concerns related to procrastination. Used socratic questions to assist the patient. Challenged some of the thoughts expressed. Explored values and how values can assist with making changes. Processed the idea of positive reinforcement. Explored what this looks like to the patient. Discussed making lists in multiple locations in the home. Assisted in problem solving.  Provided empathic statements. Assigned homework. Assessed for suicidal and homicidal ideation.   Homework: Discuss with wife reinforcement for making behavioral changes.   Next Session: Review homework. Emotional support  Diagnosis: F34.1 persistent depressive disorder   Plan:   Client Abilities: Friendly and easy to develop rapport  Client Preferences: ACT and CBT  Client statement of Needs: Work towards motivation  Treatment Level: Outpatient  Goals Work through the grieving process and face reality of own death Accept emotional support from others around them Live life to the fullest, event though time may be limited Become as knowledgeable about the medical condition  Reduce fear, anxiety about the health condition  Accept the illness Accept the role of psychological and behavioral factors  Stabilize anxiety level wile increasing ability to function Learn and implement coping skills that result in a reduction of anxiety  Alleviate depressive symptoms Recognize, accept, and cope with depressive feelings Develop healthy thinking patterns Develop healthy interpersonal relationships  Objectives target date for all objectives is 09/19/2022 Identify feelings associated with the illness Family members share with each other feelings Identify the losses or limitations  that have been  experienced Verbalize acceptance of the reality of the medical condition Commit to learning and implement a proactive approach to managing personal stresses Verbalize an understanding of the medical condition Work with therapist to develop a plan for coping with stress Learn and implement skills for managing stress Engage in social, productive activities that are possible Engage in faith based activities implement positive imagery Identify coping skills and sources of emotional support Patient's partner and family members verbalize their fears regarding severity of health condition Identify sources of emotional distress  Learning and implement calming skills to reduce overall anxiety Learn and implement problem solving strategies Identify and engage in pleasant activities Learning and implement personal and interpersonal skills to reduce anxiety and improve interpersonal relationships Learn to accept limitations in life and commit to tolerating, rather than avoiding, unpleasant emotions while accomplishing meaningful goals Identify major life conflicts from the past and present that form the basis for present anxiety Learn and implement behavioral strategies Verbalize an understanding and resolution of current interpersonal problems Learn and implement problem solving and decision making skills Learn and implement conflict resolution skills to resolve interpersonal problems Verbalize an understanding of healthy and unhealthy emotions verbalize insight into how past relationships may be influence current experiences with depression Use mindfulness and acceptance strategies and increase value based behavior  Increase hopeful statements about the future.   Interventions Teach about stress and ways to handle stress Assist the patient in developing a coping action plan for stressors Conduct skills based training for coping strategies Train problem focused skills Sort out what activities the  individual can do Encourage patient to rely upon his/her spiritual faith Teach the patient to use guided imagery Probe and evaluate family's ability to provide emotional support Allow family to share their fears Assist the patient in identifying, sorting through, and verbalizing the various feelings generated by his/her medical condition Meet with family members  Ask patient list out limitations  Use stress inoculation training  Use Acceptance and Commitment Therapy to help client accept uncomfortable realities in order to accomplish value-consistent goals Reinforce the client's insight into the role of his/her past emotional pain and present anxiety  Discuss examples demonstrating that unrealistic worry overestimates the probability of threats and underestimate patient's ability  Assist the patient in analyzing his or her worries Help patient understand that avoidance is reinforcing  Behavioral activation help the client explore the relationship, nature of the dispute,  Help the client develop new interpersonal skills and relationships Conduct Problem so living therapy Teach conflict resolution skills Use a process-experiential approach Conduct TLDP Conduct ACT  The patient and clinician reviewed the treatment plan on 10/12/2021. The patient approved of the treatment plan.    [name], PsyD

## 2022-06-22 ENCOUNTER — Ambulatory Visit (HOSPITAL_COMMUNITY)
Admission: RE | Admit: 2022-06-22 | Discharge: 2022-06-22 | Disposition: A | Discharge status: Home / Self Care | Payer: Medicare HMO | Source: Ambulatory Visit | Attending: Family Medicine | Admitting: Family Medicine

## 2022-06-22 DIAGNOSIS — Z951 Presence of aortocoronary bypass graft: Secondary | ICD-10-CM | POA: Insufficient documentation

## 2022-06-22 DIAGNOSIS — G4733 Obstructive sleep apnea (adult) (pediatric): Secondary | ICD-10-CM | POA: Insufficient documentation

## 2022-06-22 DIAGNOSIS — R059 Cough, unspecified: Secondary | ICD-10-CM | POA: Insufficient documentation

## 2022-06-22 DIAGNOSIS — I10 Essential (primary) hypertension: Secondary | ICD-10-CM | POA: Diagnosis not present

## 2022-06-22 DIAGNOSIS — K219 Gastro-esophageal reflux disease without esophagitis: Secondary | ICD-10-CM | POA: Diagnosis not present

## 2022-06-22 DIAGNOSIS — L4050 Arthropathic psoriasis, unspecified: Secondary | ICD-10-CM | POA: Diagnosis not present

## 2022-06-22 DIAGNOSIS — R1319 Other dysphagia: Secondary | ICD-10-CM

## 2022-06-22 DIAGNOSIS — R1310 Dysphagia, unspecified: Secondary | ICD-10-CM | POA: Diagnosis not present

## 2022-06-22 DIAGNOSIS — E7800 Pure hypercholesterolemia, unspecified: Secondary | ICD-10-CM | POA: Diagnosis not present

## 2022-06-22 DIAGNOSIS — I2510 Atherosclerotic heart disease of native coronary artery without angina pectoris: Secondary | ICD-10-CM | POA: Diagnosis not present

## 2022-06-22 DIAGNOSIS — T17900A Unspecified foreign body in respiratory tract, part unspecified causing asphyxiation, initial encounter: Secondary | ICD-10-CM | POA: Diagnosis not present

## 2022-07-06 ENCOUNTER — Ambulatory Visit (INDEPENDENT_AMBULATORY_CARE_PROVIDER_SITE_OTHER): Payer: Medicare HMO | Admitting: Family Medicine

## 2022-07-06 ENCOUNTER — Encounter: Payer: Self-pay | Admitting: Family Medicine

## 2022-07-06 VITALS — BP 124/60 | HR 70 | Temp 98.1°F | Ht 73.0 in | Wt 212.8 lb

## 2022-07-06 DIAGNOSIS — J069 Acute upper respiratory infection, unspecified: Secondary | ICD-10-CM | POA: Diagnosis not present

## 2022-07-06 MED ORDER — HYDROCODONE BIT-HOMATROP MBR 5-1.5 MG/5ML PO SOLN: 5.0000 mL | Freq: Three times a day (TID) | ORAL | 0 refills | Status: DC | PRN

## 2022-07-06 NOTE — Progress Notes (Signed)
Established Patient Office Visit  Subjective   Patient ID: [name] [name], male    DOB: Aug 12, 1950  Age: 71 y.o. MRN: [id number]  Chief Complaint  Patient presents with   Cough    Patient complains of cough, Non productive, x4 days   Nasal Congestion    Patient complains of Nasal congestion, x4 days,     HPI   Mr. Fetterly is seen with upper respiratory symptoms.  Started Monday with some nasal congestion and dry cough.  No fever.  No chills.  No sore throat symptoms.  His main concern is that he and his wife are scheduled to fly out of town tomorrow to [place].  He does not feel particularly ill.  No body aches.  No headaches.  No dyspnea.  He does have underlying chronic medical problems including history of CAD, atrial fibrillation, obstructive sleep apnea, gout.  Past Medical History:  Diagnosis Date   Chicken pox    Coronary artery disease    Severe three-vessel coronary artery disease with ejection fraction of 65%   GERD (gastroesophageal reflux disease)    History of gout    History of psoriasis    Hypercholesterolemia    Hypertension    OSA (obstructive sleep apnea)    Moderate with AHI of 16.9/hr now on CPAP at 10cm H2O   Psoriatic arthritis ([place])    Past Surgical History:  Procedure Laterality Date   ATRIAL FLUTTER ABLATION  03/30/2014   ATRIAL FLUTTER ABLATION N/A 03/30/2014   Procedure: ATRIAL FLUTTER ABLATION;  Surgeon: Coralyn Mark, MD;  Location: [facility] CATH LAB;  Service: Cardiovascular;  Laterality: N/A;   CARDIAC CATHETERIZATION  09/2003   Ejection fraction was estimated at 65%.   CARDIOVERSION N/A 03/15/2014   Procedure: CARDIOVERSION;  Surgeon: Lelon Perla, MD;  Location: [facility] ENDOSCOPY;  Service: Cardiovascular;  Laterality: N/A;   CARDIOVERSION N/A 07/17/2017   Procedure: CARDIOVERSION;  Surgeon: Sueanne Margarita, MD;  Location: [facility] ENDOSCOPY;  Service: Cardiovascular;  Laterality: N/A;   CORONARY ARTERY BYPASS GRAFT  09/2003    [place]-lad,svg-diag,svg-om/distal LCX,svg-pda   TEE WITHOUT CARDIOVERSION N/A 03/15/2014   Procedure: TRANSESOPHAGEAL ECHOCARDIOGRAM (TEE);  Surgeon: Lelon Perla, MD;  Location: [facility] ENDOSCOPY;  Service: Cardiovascular;  Laterality: N/A;   TEE WITHOUT CARDIOVERSION N/A 07/17/2017   Procedure: TRANSESOPHAGEAL ECHOCARDIOGRAM (TEE);  Surgeon: Sueanne Margarita, MD;  Location: [facility] ENDOSCOPY;  Service: Cardiovascular;  Laterality: N/A;   TONSILLECTOMY  1950's    reports that he has quit smoking. His smoking use included cigarettes. He has a 2.50 pack-year smoking history. He has never used smokeless tobacco. He reports current alcohol use of about 4.0 standard drinks of alcohol per week. He reports that he does not use drugs. family history includes Cancer in his mother; Heart disease in his mother; Stroke in his mother. No Known Allergies  Review of Systems  Constitutional:  Negative for chills and fever.  HENT:  Positive for congestion. Negative for sore throat.   Respiratory:  Positive for cough. Negative for sputum production, shortness of breath and wheezing.   Cardiovascular:  Negative for chest pain.      Objective:     BP 124/60 (BP Location: Left Arm, Patient Position: Sitting, Cuff Size: Normal)   Pulse 70   Temp 98.1 F (36.7 C) (Oral)   Ht '6\' 1"'$  (1.854 m)   Wt 212 lb 12.8 oz (96.5 kg)   SpO2 98%   BMI 28.08 kg/m    Physical Exam Vitals  reviewed.  Constitutional:      General: He is not in acute distress.    Appearance: Normal appearance. He is not ill-appearing.  HENT:     Ears:     Comments: He has some significant scaling and flaking of both ear canals with history of psoriasis.  Eardrums reveal no acute changes.    Mouth/Throat:     Mouth: Mucous membranes are moist.     Pharynx: Oropharynx is clear. No oropharyngeal exudate.  Cardiovascular:     Rate and Rhythm: Normal rate.  Pulmonary:     Effort: Pulmonary effort is normal.     Breath sounds: Normal breath  sounds. No wheezing or rales.  Musculoskeletal:     Cervical back: Neck supple.  Neurological:     Mental Status: He is alert.      No results found for any visits on 07/06/22.    The 10-year ASCVD risk score (Arnett DK, et al., 2019) is: 19.5%    Assessment & Plan:   Probable viral URI with cough.  Nonfocal exam.  Afebrile.  Pulse oximetry 98% room air.  -Over-the-counter Mucinex twice daily -Plenty fluids and rest -Follow-up immediately for any fever or increased shortness of breath -Did write for limited Hycodan cough syrup 1 teaspoon nightly as needed for severe cough.  [name], MD

## 2022-07-06 NOTE — Patient Instructions (Signed)
Follow up for any fever or increased shortness of breath. 

## 2022-07-25 DIAGNOSIS — R69 Illness, unspecified: Secondary | ICD-10-CM | POA: Diagnosis not present

## 2022-07-26 DIAGNOSIS — I2510 Atherosclerotic heart disease of native coronary artery without angina pectoris: Secondary | ICD-10-CM | POA: Diagnosis not present

## 2022-07-26 DIAGNOSIS — I351 Nonrheumatic aortic (valve) insufficiency: Secondary | ICD-10-CM | POA: Diagnosis not present

## 2022-07-26 DIAGNOSIS — I4819 Other persistent atrial fibrillation: Secondary | ICD-10-CM | POA: Diagnosis not present

## 2022-07-30 ENCOUNTER — Ambulatory Visit: Payer: Medicare HMO | Admitting: Psychologist

## 2022-07-30 DIAGNOSIS — R69 Illness, unspecified: Secondary | ICD-10-CM | POA: Diagnosis not present

## 2022-07-30 DIAGNOSIS — F341 Dysthymic disorder: Secondary | ICD-10-CM | POA: Diagnosis not present

## 2022-07-30 NOTE — Progress Notes (Signed)
[place] Counselor/Therapist Progress Note  Patient ID: CAL GINDLESPERGER, MRN: [id number],    Date: 07/30/2022  Time Spent: 2:07 pm to 02:47 pm; total time: 40 minutes  This session was held via in person. The patient consented to in-person therapy and was in the clinician's office. Limits of confidentiality were discussed with the patient.    Treatment Type: Individual Therapy  Reported Symptoms: Some concerns related to the past  Mental Status Exam: Appearance:  Well Groomed     Behavior: Appropriate  Motor: Normal  Speech/Language:  Clear and Coherent  Affect: Appropriate  Mood: normal  Thought process: normal  Thought content:   WNL  Sensory/Perceptual disturbances:   WNL  Orientation: oriented to person, place, and time/date  Attention: Good  Concentration: Good  Memory: WNL  Fund of knowledge:  Good  Insight:   Fair  Judgment:  Fair  Impulse Control: Good   Risk Assessment: Danger to Self:  No Self-injurious Behavior: No Danger to Others: No Duty to Warn:no Physical Aggression / Violence:No  Access to Firearms a concern: No  Gang Involvement:No   Subjective: Beginning the session, patient described himself as well while reflecting on the holidays. From there, he talked about how he has addressed issues related to his heart, Parkinson's disease and procrastination. From there, he talked about concerns related to his upbringing. By the end of the session, patient voiced being unsure what he was hoping to get out of discussing his past. He processed thoughts and emotions.  He was agreeable to homework and following up. He denied suicidal and homicidal ideation.    Interventions:  Worked on developing a therapeutic relationship with the patient using active listening and reflective statements. Provided emotional support using empathy and validation. Reflected on events since the last session. Used summary and reflective statements. Praised the patient  for doing better and explored what has assisted the patient. Processed thoughts and emotions. Identified goals for the session. Used socratic questions to assist the patient. Challenged some of the thoughts expressed by the patient. Explored what the patient was hoping to accomplish by focusing on the past. Processed thoughts and emotions. Redirected the patient. Explored whether or not the patient has accomplished goals in therapy. Used self-disclosure to process if patient is done with therapy. Assisted in problem solving.  Provided empathic statements. Assigned homework. Assessed for suicidal and homicidal ideation.   Homework: Reflect on whether or not he has accomplished all of his goals for therapy  Next Session: Review homework. Emotional support  Diagnosis: F34.1 persistent depressive disorder   Plan:   Client Abilities: Friendly and easy to develop rapport  Client Preferences: ACT and CBT  Client statement of Needs: Work towards motivation  Treatment Level: Outpatient  Goals Work through the grieving process and face reality of own death Accept emotional support from others around them Live life to the fullest, event though time may be limited Become as knowledgeable about the medical condition  Reduce fear, anxiety about the health condition  Accept the illness Accept the role of psychological and behavioral factors  Stabilize anxiety level wile increasing ability to function Learn and implement coping skills that result in a reduction of anxiety  Alleviate depressive symptoms Recognize, accept, and cope with depressive feelings Develop healthy thinking patterns Develop healthy interpersonal relationships  Objectives target date for all objectives is 09/19/2022 Identify feelings associated with the illness Family members share with each other feelings Identify the losses or limitations that have  been experienced Verbalize acceptance of the reality of the medical  condition Commit to learning and implement a proactive approach to managing personal stresses Verbalize an understanding of the medical condition Work with therapist to develop a plan for coping with stress Learn and implement skills for managing stress Engage in social, productive activities that are possible Engage in faith based activities implement positive imagery Identify coping skills and sources of emotional support Patient's partner and family members verbalize their fears regarding severity of health condition Identify sources of emotional distress  Learning and implement calming skills to reduce overall anxiety Learn and implement problem solving strategies Identify and engage in pleasant activities Learning and implement personal and interpersonal skills to reduce anxiety and improve interpersonal relationships Learn to accept limitations in life and commit to tolerating, rather than avoiding, unpleasant emotions while accomplishing meaningful goals Identify major life conflicts from the past and present that form the basis for present anxiety Learn and implement behavioral strategies Verbalize an understanding and resolution of current interpersonal problems Learn and implement problem solving and decision making skills Learn and implement conflict resolution skills to resolve interpersonal problems Verbalize an understanding of healthy and unhealthy emotions verbalize insight into how past relationships may be influence current experiences with depression Use mindfulness and acceptance strategies and increase value based behavior  Increase hopeful statements about the future.   Interventions Teach about stress and ways to handle stress Assist the patient in developing a coping action plan for stressors Conduct skills based training for coping strategies Train problem focused skills Sort out what activities the individual can do Encourage patient to rely upon his/her  spiritual faith Teach the patient to use guided imagery Probe and evaluate family's ability to provide emotional support Allow family to share their fears Assist the patient in identifying, sorting through, and verbalizing the various feelings generated by his/her medical condition Meet with family members  Ask patient list out limitations  Use stress inoculation training  Use Acceptance and Commitment Therapy to help client accept uncomfortable realities in order to accomplish value-consistent goals Reinforce the client's insight into the role of his/her past emotional pain and present anxiety  Discuss examples demonstrating that unrealistic worry overestimates the probability of threats and underestimate patient's ability  Assist the patient in analyzing his or her worries Help patient understand that avoidance is reinforcing  Behavioral activation help the client explore the relationship, nature of the dispute,  Help the client develop new interpersonal skills and relationships Conduct Problem so living therapy Teach conflict resolution skills Use a process-experiential approach Conduct TLDP Conduct ACT  The patient and clinician reviewed the treatment plan on 10/12/2021. The patient approved of the treatment plan.    [name], PsyD

## 2022-09-03 ENCOUNTER — Encounter: Payer: Self-pay | Admitting: Family Medicine

## 2022-09-10 ENCOUNTER — Ambulatory Visit: Payer: Medicare HMO | Admitting: Psychologist

## 2022-09-11 DIAGNOSIS — R69 Illness, unspecified: Secondary | ICD-10-CM | POA: Diagnosis not present

## 2022-09-19 ENCOUNTER — Encounter: Payer: Self-pay | Admitting: Family

## 2022-09-19 ENCOUNTER — Telehealth (INDEPENDENT_AMBULATORY_CARE_PROVIDER_SITE_OTHER): Payer: Medicare HMO | Admitting: Family

## 2022-09-19 ENCOUNTER — Encounter: Payer: Self-pay | Admitting: Cardiology

## 2022-09-19 VITALS — Temp 100.8°F | Ht 73.0 in | Wt 200.0 lb

## 2022-09-19 DIAGNOSIS — U071 COVID-19: Secondary | ICD-10-CM | POA: Diagnosis not present

## 2022-09-19 MED ORDER — NIRMATRELVIR/RITONAVIR (PAXLOVID) TABLET (RENAL DOSING): 2.0000 | ORAL_TABLET | Freq: Two times a day (BID) | ORAL | 0 refills | Status: AC

## 2022-09-19 NOTE — Progress Notes (Signed)
MyChart Video Visit    Virtual Visit via Video Note   This format is felt to be most appropriate for this patient at this time. Physical exam was limited by quality of the video and audio technology used for the visit. CMA was able to get the patient set up on a video visit.  Patient location: Home. Patient and provider in visit Provider location: Office  I discussed the limitations of evaluation and management by telemedicine and the availability of in person appointments. The patient expressed understanding and agreed to proceed.  Visit Date: 09/19/2022  Today's healthcare provider: Jeanie Sewer, NP     Subjective:   Patient ID: [name] [name], male    DOB: 1951-06-20, 72 y.o.   MRN: [id number]  Chief Complaint  Patient presents with   Covid Positive    sx for 1d   HPI URI sx:  Runny nose, chest congestion ,sore throat and fever of 100.8.Tested positive this morning and symptoms started today. reports his wife had covid 3 weeks ago.    Assessment & Plan:  1. COVID-19- Sending low dose Paxlovid pt advised to check with his Cardiologist via [facility] & call first thing in am before starting med to be sure ok to reduce Eliquis to 1 pill qd vs. bid while taking Paxlovid, also instructed to hold his Lipitor while taking. Advised on use, how to take, & SE. Advised of CDC guidelines for masking if out in public. OK to continue taking OTC sinus or pain meds. Encouraged to monitor & notify office of any worsening symptoms: increased shortness of breath, weakness, and signs of dehydration. Instructed to rest and hydrate well.   - nirmatrelvir/ritonavir, renal dosing, (PAXLOVID) 10 x 150 MG & 10 x '100MG'$  TABS; Take 2 tablets by mouth 2 (two) times daily for 5 days. Call Cardiology to get ok before taking. HOLD Lipitor while taking, resume 1 day after finishing this med.  Dispense: 20 tablet; Refill: 0   Past Medical History:  Diagnosis Date   Chicken pox    Coronary artery  disease    Severe three-vessel coronary artery disease with ejection fraction of 65%   GERD (gastroesophageal reflux disease)    History of gout    History of psoriasis    Hypercholesterolemia    Hypertension    OSA (obstructive sleep apnea)    Moderate with AHI of 16.9/hr now on CPAP at 10cm H2O   Psoriatic arthritis ([place])     Past Surgical History:  Procedure Laterality Date   ATRIAL FLUTTER ABLATION  03/30/2014   ATRIAL FLUTTER ABLATION N/A 03/30/2014   Procedure: ATRIAL FLUTTER ABLATION;  Surgeon: Coralyn Mark, MD;  Location: [facility];  Service: Cardiovascular;  Laterality: N/A;   CARDIAC CATHETERIZATION  09/2003   Ejection fraction was estimated at 65%.   CARDIOVERSION N/A 03/15/2014   Procedure: CARDIOVERSION;  Surgeon: Lelon Perla, MD;  Location: [facility] ENDOSCOPY;  Service: Cardiovascular;  Laterality: N/A;   CARDIOVERSION N/A 07/17/2017   Procedure: CARDIOVERSION;  Surgeon: Sueanne Margarita, MD;  Location: [facility] ENDOSCOPY;  Service: Cardiovascular;  Laterality: N/A;   CORONARY ARTERY BYPASS GRAFT  09/2003   [place]-lad,svg-diag,svg-om/distal LCX,svg-pda   TEE WITHOUT CARDIOVERSION N/A 03/15/2014   Procedure: TRANSESOPHAGEAL ECHOCARDIOGRAM (TEE);  Surgeon: Lelon Perla, MD;  Location: [facility] ENDOSCOPY;  Service: Cardiovascular;  Laterality: N/A;   TEE WITHOUT CARDIOVERSION N/A 07/17/2017   Procedure: TRANSESOPHAGEAL ECHOCARDIOGRAM (TEE);  Surgeon: Sueanne Margarita, MD;  Location: [place];  Service:  Cardiovascular;  Laterality: N/A;   TONSILLECTOMY  1950's    Outpatient Medications Prior to Visit  Medication Sig Dispense Refill   acetaminophen (TYLENOL) 325 MG tablet Take 2 tablets (650 mg total) by mouth every 6 (six) hours as needed for up to 30 doses for mild pain or moderate pain. 30 tablet 0   apixaban (ELIQUIS) 5 MG TABS tablet TAKE 1 TABLET BY MOUTH TWICE A DAY 60 tablet 5   atorvastatin (LIPITOR) 80 MG tablet TAKE 1 TABLET BY MOUTH EVERY DAY 90 tablet 3    cholecalciferol (VITAMIN D) 1000 UNITS tablet Take 1,000 Units by mouth daily.     Coenzyme Q10 (CO Q 10 PO) Take 100 mg by mouth daily.      furosemide (LASIX) 20 MG tablet TAKE 1 TABLET BY MOUTH TWICE A DAY 180 tablet 3   losartan (COZAAR) 50 MG tablet TAKE 1 TABLET BY MOUTH EVERY DAY 90 tablet 2   methotrexate 2.5 MG tablet Take 2.5 mg by mouth once a week. Caution:Chemotherapy. Protect from light. Take on Tuesdays     metoprolol succinate (TOPROL-XL) 50 MG 24 hr tablet Take 75 mg by mouth daily. Take with or immediately following a meal.     multivitamin (THERAGRAN) per tablet Take 1 tablet by mouth daily.     nitroGLYCERIN (NITROSTAT) 0.4 MG SL tablet Place 1 tablet (0.4 mg total) under the tongue every 5 (five) minutes as needed for chest pain. 25 tablet 2   potassium chloride SA (K-DUR,KLOR-CON) 20 MEQ tablet Take 1 tablet (20 mEq total) by mouth daily. 30 tablet 6   triamcinolone cream (KENALOG) 0.1 % SMARTSIG:1 Application Topical 2-3 Times Daily     colchicine 0.6 MG tablet TAKE 1 TABLET BY MOUTH TWICE A DAY (Patient taking differently: Take 0.6 mg by mouth daily as needed (gout).) 180 tablet 1   HYDROcodone bit-homatropine (HYCODAN) 5-1.5 MG/5ML syrup Take 5 mLs by mouth every 8 (eight) hours as needed for cough. 120 mL 0   No facility-administered medications prior to visit.    No Known Allergies    Objective:   Physical Exam Vitals and nursing note reviewed.  Constitutional:      General: Pt is not in acute distress.    Appearance: Normal appearance.  HENT:     Head: Normocephalic.  Pulmonary:     Effort: No respiratory distress.  Musculoskeletal:     Cervical back: Normal range of motion.  Skin:    General: Skin is dry.     Coloration: Skin is not pale.  Neurological:     Mental Status: Pt is alert and oriented to person, place, and time.  Psychiatric:        Mood and Affect: Mood normal.   Temp (!) 100.8 F (38.2 C) (Oral) Comment: Pt reported  Ht '6\' 1"'$  (1.854  m)   Wt 200 lb (90.7 kg)   BMI 26.39 kg/m   Wt Readings from Last 3 Encounters:  09/19/22 200 lb (90.7 kg)  07/06/22 212 lb 12.8 oz (96.5 kg)  06/05/22 210 lb (95.3 kg)      I discussed the assessment and treatment plan with the patient. The patient was provided an opportunity to ask questions and all were answered. The patient agreed with the plan and demonstrated an understanding of the instructions.   The patient was advised to call back or seek an in-person evaluation if the symptoms worsen or if the condition fails to improve as anticipated.  Jeanie Sewer, NP  [place] [phone] (phone) [phone] (fax)  [place]

## 2022-09-30 ENCOUNTER — Other Ambulatory Visit: Payer: Self-pay | Admitting: Cardiology

## 2022-09-30 ENCOUNTER — Encounter: Payer: Self-pay | Admitting: Family Medicine

## 2022-10-01 ENCOUNTER — Telehealth (INDEPENDENT_AMBULATORY_CARE_PROVIDER_SITE_OTHER): Payer: Medicare HMO | Admitting: Family Medicine

## 2022-10-01 ENCOUNTER — Encounter: Payer: Self-pay | Admitting: Family Medicine

## 2022-10-01 VITALS — BP 140/73 | HR 66 | Ht 73.0 in | Wt 200.0 lb

## 2022-10-01 DIAGNOSIS — U071 COVID-19: Secondary | ICD-10-CM

## 2022-10-01 NOTE — Progress Notes (Signed)
Patient ID: [name] [name], male   DOB: 1950-08-02, 72 y.o.   MRN: [id number]   Virtual Visit via Video Note  I connected with [name] [name] on 10/01/22 at  4:30 PM EDT by a video enabled telemedicine application and verified that I am speaking with the correct person using two identifiers.  Location patient: home Location provider:work or home office Persons participating in the virtual visit: patient, provider  I discussed the limitations of evaluation and management by telemedicine and the availability of in person appointments. The patient expressed understanding and agreed to proceed.   HPI: [name] [name] has COVID infection.  He was actually diagnosed on 28 February with onset around the 27th.  He was treated with 5 days of Paxlovid and symptoms had been relatively mild and had improved.  He came off the Paxlovid and this past Saturday had some recurrent symptoms in terms of nasal congestion and after having 1 negative COVID test tested and came back positive.  He has no dyspnea.  No fever.  No major body aches.  Denies any nausea, vomiting, or diarrhea.  He does have multiple comorbidities with history of atrial fib, CAD, hypertension, obstructive sleep apnea, psoriatic arthritis   ROS: See pertinent positives and negatives per HPI.  Past Medical History:  Diagnosis Date   Chicken pox    Coronary artery disease    Severe three-vessel coronary artery disease with ejection fraction of 65%   GERD (gastroesophageal reflux disease)    History of gout    History of psoriasis    Hypercholesterolemia    Hypertension    OSA (obstructive sleep apnea)    Moderate with AHI of 16.9/hr now on CPAP at 10cm H2O   Psoriatic arthritis ([place])     Past Surgical History:  Procedure Laterality Date   ATRIAL FLUTTER ABLATION  03/30/2014   ATRIAL FLUTTER ABLATION N/A 03/30/2014   Procedure: ATRIAL FLUTTER ABLATION;  Surgeon: Coralyn Mark, MD;  Location: [place] CATH LAB;  Service: Cardiovascular;   Laterality: N/A;   CARDIAC CATHETERIZATION  09/2003   Ejection fraction was estimated at 65%.   CARDIOVERSION N/A 03/15/2014   Procedure: CARDIOVERSION;  Surgeon: Lelon Perla, MD;  Location: [facility] ENDOSCOPY;  Service: Cardiovascular;  Laterality: N/A;   CARDIOVERSION N/A 07/17/2017   Procedure: CARDIOVERSION;  Surgeon: Sueanne Margarita, MD;  Location: [facility] ENDOSCOPY;  Service: Cardiovascular;  Laterality: N/A;   CORONARY ARTERY BYPASS GRAFT  09/2003   [place]-lad,svg-diag,svg-om/distal LCX,svg-pda   TEE WITHOUT CARDIOVERSION N/A 03/15/2014   Procedure: TRANSESOPHAGEAL ECHOCARDIOGRAM (TEE);  Surgeon: Lelon Perla, MD;  Location: [facility] ENDOSCOPY;  Service: Cardiovascular;  Laterality: N/A;   TEE WITHOUT CARDIOVERSION N/A 07/17/2017   Procedure: TRANSESOPHAGEAL ECHOCARDIOGRAM (TEE);  Surgeon: Sueanne Margarita, MD;  Location: [facility] ENDOSCOPY;  Service: Cardiovascular;  Laterality: N/A;   TONSILLECTOMY  1950's    Family History  Problem Relation Age of Onset   Heart disease Mother        cabg   Cancer Mother        breast   Stroke Mother     SOCIAL HX: Smoked only briefly late 97s.   Current Outpatient Medications:    acetaminophen (TYLENOL) 325 MG tablet, Take 2 tablets (650 mg total) by mouth every 6 (six) hours as needed for up to 30 doses for mild pain or moderate pain., Disp: 30 tablet, Rfl: 0   apixaban (ELIQUIS) 5 MG TABS tablet, TAKE 1 TABLET BY MOUTH TWICE A DAY, Disp: 60 tablet, Rfl:  5   atorvastatin (LIPITOR) 80 MG tablet, TAKE 1 TABLET BY MOUTH EVERY DAY, Disp: 90 tablet, Rfl: 1   cholecalciferol (VITAMIN D) 1000 UNITS tablet, Take 1,000 Units by mouth daily., Disp: , Rfl:    Coenzyme Q10 (CO Q 10 PO), Take 100 mg by mouth daily. , Disp: , Rfl:    furosemide (LASIX) 20 MG tablet, TAKE 1 TABLET BY MOUTH TWICE A DAY, Disp: 180 tablet, Rfl: 3   losartan (COZAAR) 50 MG tablet, TAKE 1 TABLET BY MOUTH EVERY DAY, Disp: 90 tablet, Rfl: 2   methotrexate 2.5 MG tablet, Take 2.5 mg by mouth  once a week. Caution:Chemotherapy. Protect from light. Take on Tuesdays, Disp: , Rfl:    metoprolol succinate (TOPROL-XL) 50 MG 24 hr tablet, Take 75 mg by mouth daily. Take with or immediately following a meal., Disp: , Rfl:    multivitamin (THERAGRAN) per tablet, Take 1 tablet by mouth daily., Disp: , Rfl:    nitroGLYCERIN (NITROSTAT) 0.4 MG SL tablet, Place 1 tablet (0.4 mg total) under the tongue every 5 (five) minutes as needed for chest pain., Disp: 25 tablet, Rfl: 2   potassium chloride SA (K-DUR,KLOR-CON) 20 MEQ tablet, Take 1 tablet (20 mEq total) by mouth daily., Disp: 30 tablet, Rfl: 6   triamcinolone cream (KENALOG) 0.1 %, SMARTSIG:1 Application Topical 2-3 Times Daily, Disp: , Rfl:   EXAM:  VITALS per patient if applicable:  GENERAL: alert, oriented, appears well and in no acute distress  HEENT: atraumatic, conjunttiva clear, no obvious abnormalities on inspection of external nose and ears  NECK: normal movements of the head and neck  LUNGS: on inspection no signs of respiratory distress, breathing rate appears normal, no obvious gross SOB, gasping or wheezing  CV: no obvious cyanosis  MS: moves all visible extremities without noticeable abnormality  PSYCH/NEURO: pleasant and cooperative, no obvious depression or anxiety, speech and thought processing grossly intact  ASSESSMENT AND PLAN:  Discussed the following assessment and plan:  COVID infection.  Actually had onset way back February 27 and now has mild relapse of symptoms after completing Paxlovid.  Suspect this is more of a relapse phenomenon versus reinfection.  He is not experiencing any dyspnea and has no fever.  Plenty fluids and rest.  He does plan to stay isolated for the next 5 days.  Follow-up promptly for dyspnea or fever or other concerns     I discussed the assessment and treatment plan with the patient. The patient was provided an opportunity to ask questions and all were answered. The patient agreed  with the plan and demonstrated an understanding of the instructions.   The patient was advised to call back or seek an in-person evaluation if the symptoms worsen or if the condition fails to improve as anticipated.     [name], MD

## 2022-10-01 NOTE — Telephone Encounter (Signed)
I spoke with the patient and he has been scheduled for virtual visit today

## 2022-10-04 ENCOUNTER — Ambulatory Visit: Payer: Medicare HMO | Admitting: Cardiology

## 2022-10-10 DIAGNOSIS — L400 Psoriasis vulgaris: Secondary | ICD-10-CM | POA: Diagnosis not present

## 2022-10-10 DIAGNOSIS — D492 Neoplasm of unspecified behavior of bone, soft tissue, and skin: Secondary | ICD-10-CM | POA: Diagnosis not present

## 2022-10-10 DIAGNOSIS — L814 Other melanin hyperpigmentation: Secondary | ICD-10-CM | POA: Diagnosis not present

## 2022-10-10 DIAGNOSIS — D225 Melanocytic nevi of trunk: Secondary | ICD-10-CM | POA: Diagnosis not present

## 2022-10-10 DIAGNOSIS — L538 Other specified erythematous conditions: Secondary | ICD-10-CM | POA: Diagnosis not present

## 2022-10-10 DIAGNOSIS — L821 Other seborrheic keratosis: Secondary | ICD-10-CM | POA: Diagnosis not present

## 2022-10-10 DIAGNOSIS — B079 Viral wart, unspecified: Secondary | ICD-10-CM | POA: Diagnosis not present

## 2022-10-31 ENCOUNTER — Other Ambulatory Visit: Payer: Self-pay | Admitting: Cardiology

## 2022-10-31 NOTE — Telephone Encounter (Signed)
Pt last saw Dr [place] 04/06/22, last labs 03/06/22 Creat 0.85, age 72, weight 90.7kg, based on specified criteria pt is on appropriate dosage of Eliquis 5mg  BID for afib

## 2022-11-05 DIAGNOSIS — I4892 Unspecified atrial flutter: Secondary | ICD-10-CM | POA: Diagnosis not present

## 2022-11-05 DIAGNOSIS — I483 Typical atrial flutter: Secondary | ICD-10-CM | POA: Diagnosis not present

## 2022-11-05 IMAGING — DX DG KNEE COMPLETE 4+V*L*
4 series · 4 of 4 positions shown · non-contrast
Comparison: None.

CLINICAL DATA: Left knee edema, increased warmth x 5 days. Patient
reports difficulty flexing the knee for several months.

EXAM:
LEFT KNEE - COMPLETE 4+ VIEW

[knee ap]
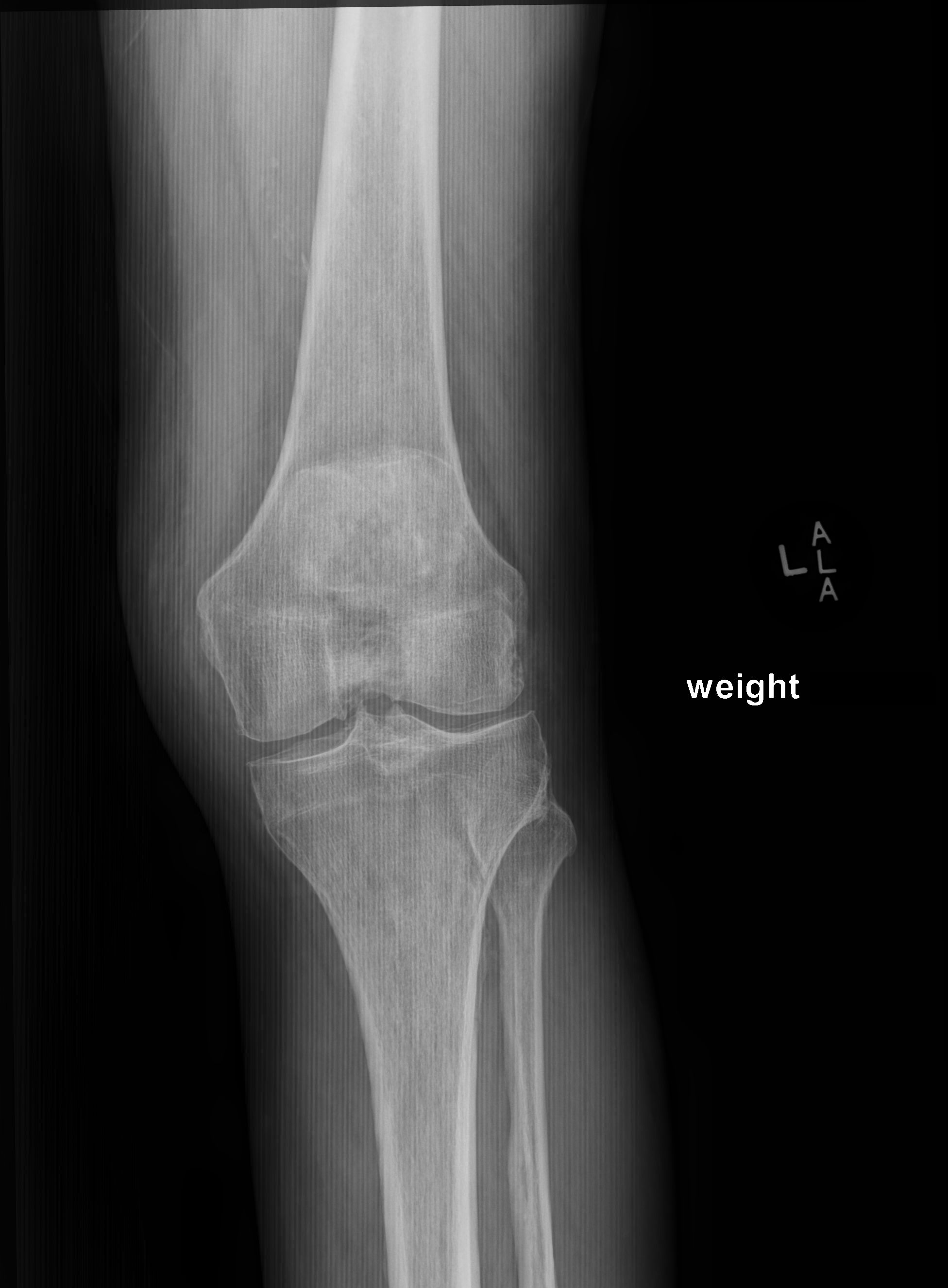

[knee [person_name] view pa]
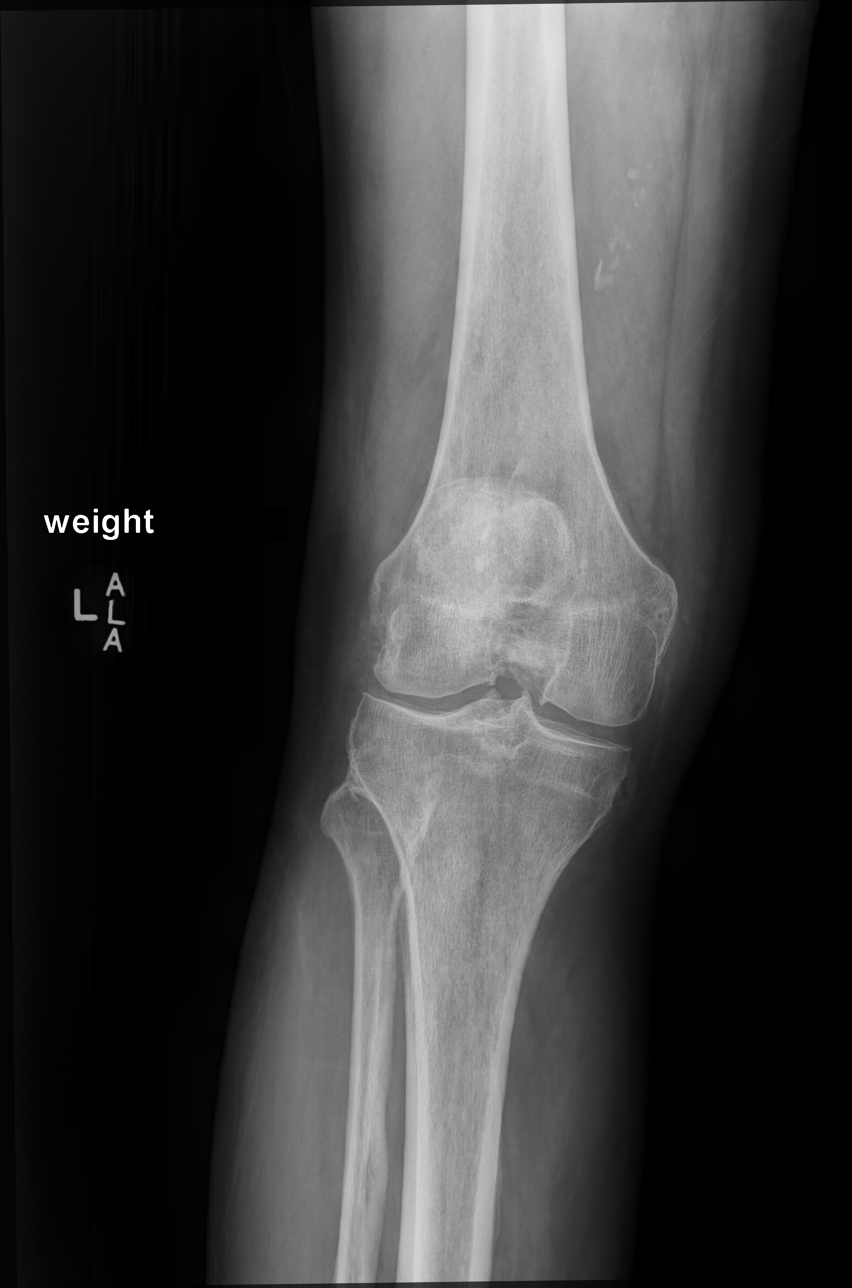

[patella (sunrise) tan]
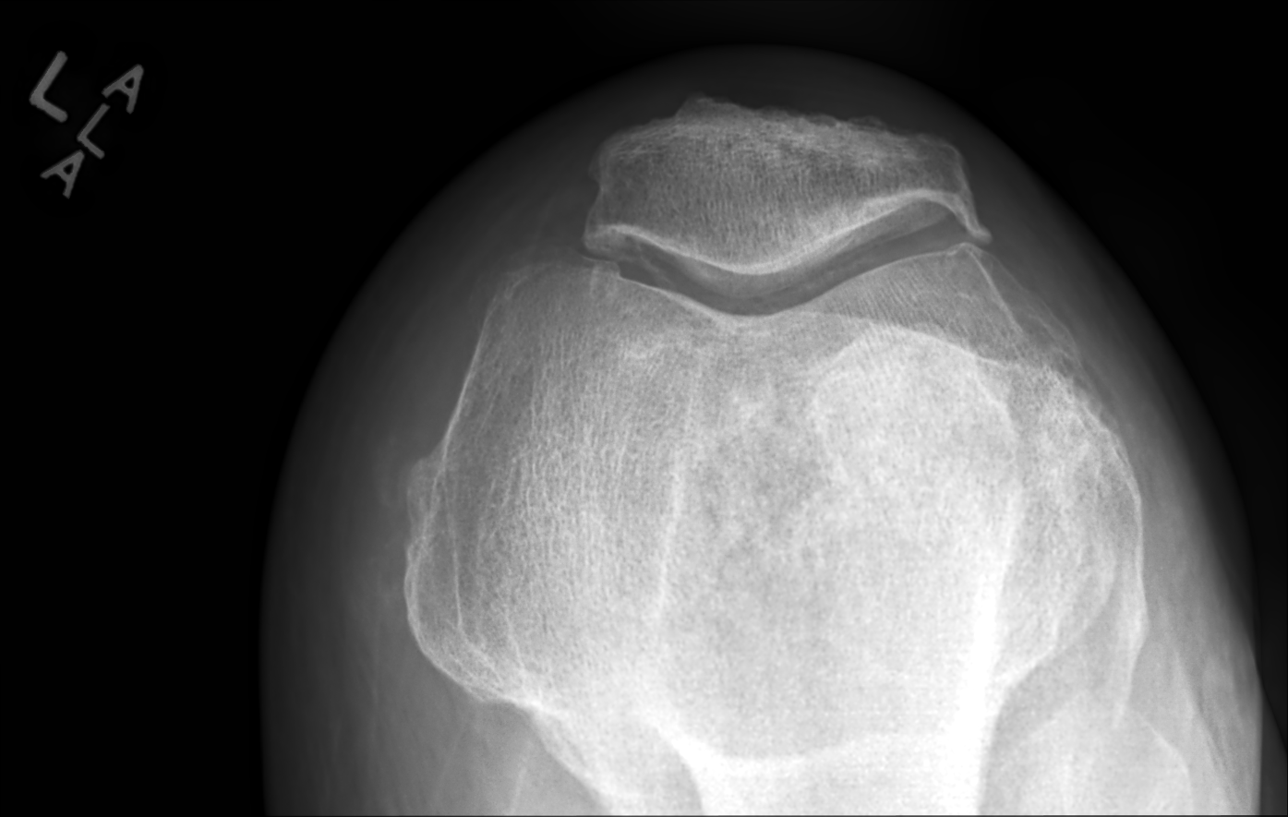

[knee lat]
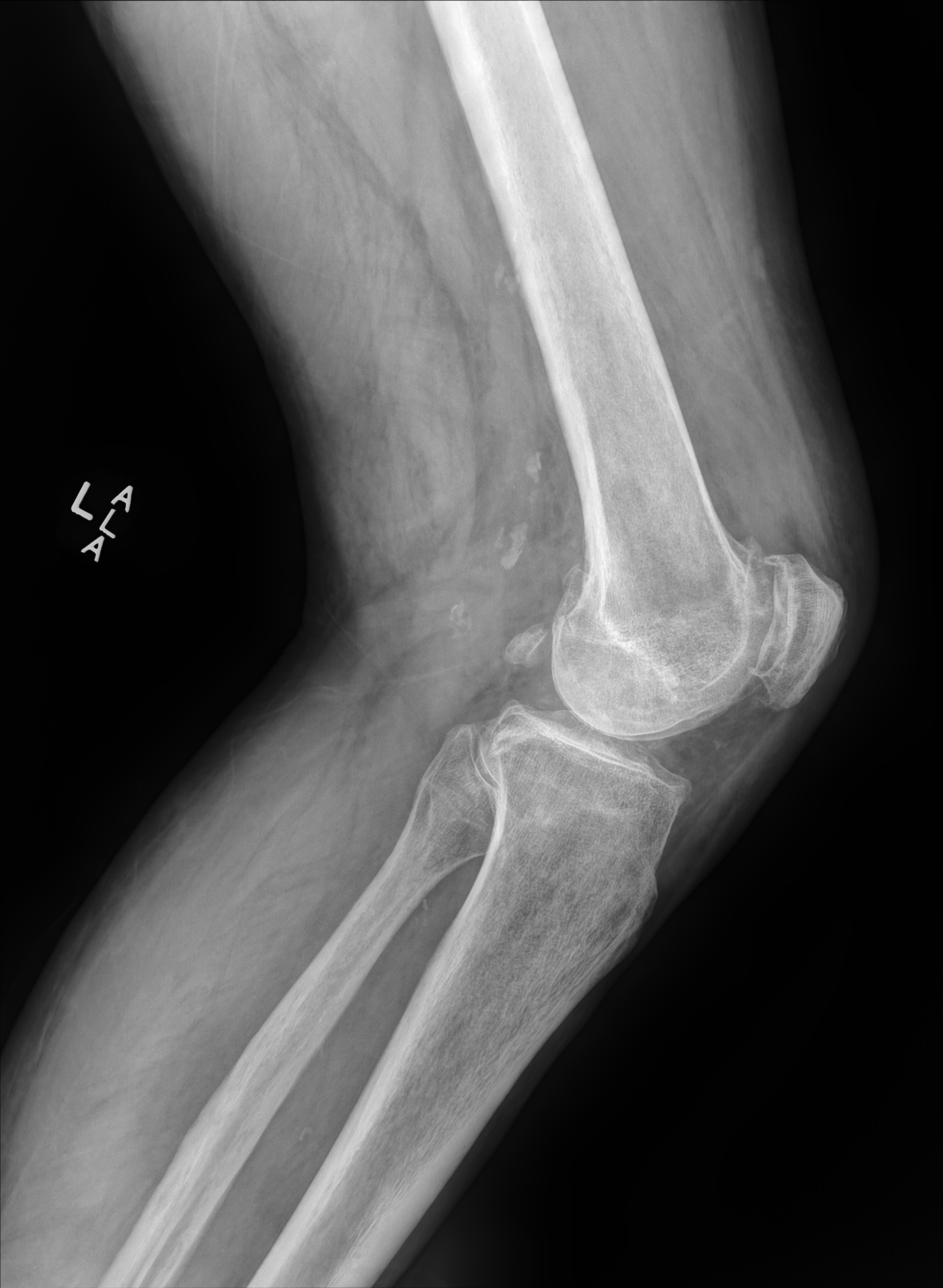

[4 of 4 positions shown; findings below may reference images not displayed]

FINDINGS: There is no acute fracture or dislocation. The bones are osteopenic.
Mild arthritic changes of the knee with tricompartmental narrowing
and spurring. Small suprapatellar effusion. The soft tissues are
unremarkable.
IMPRESSION: 1. No acute fracture or dislocation.
2. Mild arthritic changes of the knee with small suprapatellar
effusion.

## 2022-11-09 ENCOUNTER — Other Ambulatory Visit: Payer: Self-pay | Admitting: Cardiology

## 2022-11-15 ENCOUNTER — Ambulatory Visit: Payer: Medicare HMO | Admitting: Cardiology

## 2022-11-16 ENCOUNTER — Ambulatory Visit: Payer: Medicare HMO | Attending: Cardiology | Admitting: Cardiology

## 2022-11-16 ENCOUNTER — Telehealth: Payer: Self-pay | Admitting: *Deleted

## 2022-11-16 ENCOUNTER — Encounter: Payer: Self-pay | Admitting: Cardiology

## 2022-11-16 VITALS — BP 136/64 | HR 61 | Ht 73.0 in | Wt 209.0 lb

## 2022-11-16 DIAGNOSIS — G4733 Obstructive sleep apnea (adult) (pediatric): Secondary | ICD-10-CM

## 2022-11-16 DIAGNOSIS — I10 Essential (primary) hypertension: Secondary | ICD-10-CM | POA: Diagnosis not present

## 2022-11-16 NOTE — Patient Instructions (Signed)
Medication Instructions:  Your physician recommends that you continue on your current medications as directed. Please refer to the Current Medication list given to you today.  *If you need a refill on your cardiac medications before your next appointment, please call your pharmacy*   Lab Work: None.  If you have labs (blood work) drawn today and your tests are completely normal, you will receive your results only by: MyChart Message (if you have MyChart) OR A paper copy in the mail If you have any lab test that is abnormal or we need to change your treatment, we will call you to review the results.   Testing/Procedures: None.   Follow-Up:  Your next appointment:   1 year(s)  Provider:   Dr. Armanda Magic, MD   Other Instructions Dr. Mayford Knife has sent in new CPAP orders for you. Someone from our sleep team or your DME company will contact you to coordinate as needed.

## 2022-11-16 NOTE — Telephone Encounter (Signed)
Per Dr Mayford Knife, his device is end of life for the motor so will order a new ResMed CPAP at 10cm H2O with heated humidity and mask of choice   Order placed to [facility]  Upon patient request DME selection is [facility]. Patient understands he will be contacted by [facility] to set up his cpap. Patient understands to call if [facility] does not contact him with new setup in a timely manner. Patient understands they will be called once confirmation has been received from [facility] that they have received their new machine to schedule 10 week follow up appointment.   [facility] notified of new cpap order  Please add to airview Patient was grateful for the call and thanked me.

## 2022-11-16 NOTE — Progress Notes (Signed)
Date:  11/16/2022   ID:  [name] [name], DOB 27-Mar-1951, MRN [id number]  PCP:  Kristian Covey, MD  Cardiologist:  Peter Swaziland, MD Sleep Medicine:  Armanda Magic, MD Electrophysiologist:  None   Chief Complaint:  OSA, HTN  History of Present Illness:    [name] [name] is a 72 y.o. male  with a hx of moderate OSA with an AHI of 16.9 events per hour mainly occurring in the non supine position and during NREM sleep.  He is on CPAP at 10cm H2O.    He is doing well with his PAP device and thinks that he has gotten used to it.  He tolerates the mask and feels the pressure is adequate.  Since going on PAP he feels rested in the am and has no significant daytime sleepiness.  He does have problems from time to time with mouth dryness.He denies any significant nasal dryness or nasal congestion.  He does not think that he snores.  He tells me that he needs a new device because it is at motor end of life.  Prior CV studies:   The following studies were reviewed today:  PAP compliance download from [employer]  Past Medical History:  Diagnosis Date   Chicken pox    Coronary artery disease    Severe three-vessel coronary artery disease with ejection fraction of 65%   GERD (gastroesophageal reflux disease)    History of gout    History of psoriasis    Hypercholesterolemia    Hypertension    OSA (obstructive sleep apnea)    Moderate with AHI of 16.9/hr now on CPAP at 10cm H2O   Psoriatic arthritis (HCC)    Past Surgical History:  Procedure Laterality Date   ATRIAL FLUTTER ABLATION  03/30/2014   ATRIAL FLUTTER ABLATION N/A 03/30/2014   Procedure: ATRIAL FLUTTER ABLATION;  Surgeon: Gardiner Rhyme, MD;  Location: [facility] CATH LAB;  Service: Cardiovascular;  Laterality: N/A;   CARDIAC CATHETERIZATION  09/2003   Ejection fraction was estimated at 65%.   CARDIOVERSION N/A 03/15/2014   Procedure: CARDIOVERSION;  Surgeon: Lewayne Bunting, MD;  Location: [facility] ENDOSCOPY;  Service: Cardiovascular;   Laterality: N/A;   CARDIOVERSION N/A 07/17/2017   Procedure: CARDIOVERSION;  Surgeon: Quintella Reichert, MD;  Location: [facility] ENDOSCOPY;  Service: Cardiovascular;  Laterality: N/A;   CORONARY ARTERY BYPASS GRAFT  09/2003   [place]-lad,svg-diag,svg-om/distal LCX,svg-pda   TEE WITHOUT CARDIOVERSION N/A 03/15/2014   Procedure: TRANSESOPHAGEAL ECHOCARDIOGRAM (TEE);  Surgeon: Lewayne Bunting, MD;  Location: [facility] ENDOSCOPY;  Service: Cardiovascular;  Laterality: N/A;   TEE WITHOUT CARDIOVERSION N/A 07/17/2017   Procedure: TRANSESOPHAGEAL ECHOCARDIOGRAM (TEE);  Surgeon: Quintella Reichert, MD;  Location: [facility] ENDOSCOPY;  Service: Cardiovascular;  Laterality: N/A;   TONSILLECTOMY  1950's     Current Meds  Medication Sig   acetaminophen (TYLENOL) 325 MG tablet Take 2 tablets (650 mg total) by mouth every 6 (six) hours as needed for up to 30 doses for mild pain or moderate pain.   atorvastatin (LIPITOR) 80 MG tablet TAKE 1 TABLET BY MOUTH EVERY DAY   cholecalciferol (VITAMIN D) 1000 UNITS tablet Take 1,000 Units by mouth daily.   Coenzyme Q10 (CO Q 10 PO) Take 100 mg by mouth daily.    ELIQUIS 5 MG TABS tablet TAKE 1 TABLET BY MOUTH TWICE A DAY   furosemide (LASIX) 20 MG tablet TAKE 1 TABLET BY MOUTH TWICE A DAY   losartan (COZAAR) 50 MG tablet TAKE 1 TABLET BY  MOUTH EVERY DAY   metoprolol succinate (TOPROL-XL) 50 MG 24 hr tablet Take 1 tablet by mouth daily.   multivitamin (THERAGRAN) per tablet Take 1 tablet by mouth daily.   nitroGLYCERIN (NITROSTAT) 0.4 MG SL tablet Place 1 tablet (0.4 mg total) under the tongue every 5 (five) minutes as needed for chest pain.   potassium chloride SA (K-DUR,KLOR-CON) 20 MEQ tablet Take 1 tablet (20 mEq total) by mouth daily.   [DISCONTINUED] metoprolol succinate (TOPROL-XL) 50 MG 24 hr tablet Take 75 mg by mouth daily. Take with or immediately following a meal.     Allergies:   Patient has no known allergies.   Social History   Tobacco Use   Smoking status: Former     Packs/day: 0.50    Years: 5.00    Additional pack years: 0.00    Total pack years: 2.50    Types: Cigarettes   Smokeless tobacco: Never   Tobacco comments:    "quit smoking cigarettes in the late 1970's"  Vaping Use   Vaping Use: Never used  Substance Use Topics   Alcohol use: Yes    Alcohol/week: 4.0 standard drinks of alcohol    Types: 2 Cans of beer, 2 Shots of liquor per week    Comment: 3 times per week, 2-3 drinks each time   Drug use: No     Family Hx: The patient's family history includes Cancer in his mother; Heart disease in his mother; Stroke in his mother.  ROS:   Please see the history of present illness.     All other systems reviewed and are negative.   Labs/Other Tests and Data Reviewed:    Recent Labs: 03/06/2022: ALT 16; BUN 15; Creatinine, Ser 0.85; Hemoglobin 15.0; Platelets 157.0; Potassium 4.6; Sodium 139; TSH 0.85   Recent Lipid Panel Lab Results  Component Value Date/Time   CHOL 131 03/06/2022 09:09 AM   CHOL 163 11/30/2019 08:27 AM   TRIG 61.0 03/06/2022 09:09 AM   HDL 57.20 03/06/2022 09:09 AM   HDL 74 11/30/2019 08:27 AM   CHOLHDL 2 03/06/2022 09:09 AM   LDLCALC 62 03/06/2022 09:09 AM   LDLCALC 75 11/30/2019 08:27 AM    Wt Readings from Last 3 Encounters:  11/16/22 209 lb (94.8 kg)  10/01/22 200 lb (90.7 kg)  09/19/22 200 lb (90.7 kg)     Objective:    VS:  BP 136/32mmHg, HR 61bpm GEN: Well nourished, well developed in no acute distress HEENT: Normal NECK: No JVD; No carotid bruits LYMPHATICS: No lymphadenopathy CARDIAC:RRR, no murmurs, rubs, gallops RESPIRATORY:  Clear to auscultation without rales, wheezing or rhonchi  ABDOMEN: Soft, non-tender, non-distended MUSCULOSKELETAL:  No edema; No deformity  SKIN: Warm and dry NEUROLOGIC:  Alert and oriented x 3 PSYCHIATRIC:  Normal affect  ASSESSMENT & PLAN:    1. OSA - The patient is tolerating PAP therapy well without any problems.  The patient has been using and benefiting  from PAP use and will continue to benefit from therapy.  -his device is end of life for the motor so will order a new ResMed CPAP at 10cm H2O with heated humidity and mask of choice -he will see me back 6 weeks after he gets his device  2.  HTN -BP controlled -continue prescription drug management with Toprol XL 50mg  daily and Losartan 50mg  daily with PRn refills   Medication Adjustments/Labs and Tests Ordered: Current medicines are reviewed at length with the patient today.  Concerns regarding medicines are outlined  above.  Tests Ordered: Orders Placed This Encounter  Procedures   EKG 12-Lead   Medication Changes: No orders of the defined types were placed in this encounter.   Disposition:  Follow up in 1 year(s)  Signed, Armanda Magic, MD  11/16/2022 12:03 PM    [facility]

## 2022-11-21 DIAGNOSIS — R69 Illness, unspecified: Secondary | ICD-10-CM | POA: Diagnosis not present

## 2022-11-29 ENCOUNTER — Ambulatory Visit: Payer: Medicare HMO | Attending: Cardiology | Admitting: Cardiology

## 2022-11-29 ENCOUNTER — Encounter: Payer: Self-pay | Admitting: Cardiology

## 2022-11-29 VITALS — BP 130/66 | HR 61 | Ht 73.0 in | Wt 207.4 lb

## 2022-11-29 DIAGNOSIS — I4891 Unspecified atrial fibrillation: Secondary | ICD-10-CM

## 2022-11-29 DIAGNOSIS — I2510 Atherosclerotic heart disease of native coronary artery without angina pectoris: Secondary | ICD-10-CM

## 2022-11-29 DIAGNOSIS — Z951 Presence of aortocoronary bypass graft: Secondary | ICD-10-CM | POA: Diagnosis not present

## 2022-11-29 DIAGNOSIS — I4892 Unspecified atrial flutter: Secondary | ICD-10-CM | POA: Diagnosis not present

## 2022-11-29 DIAGNOSIS — I351 Nonrheumatic aortic (valve) insufficiency: Secondary | ICD-10-CM

## 2022-11-29 DIAGNOSIS — G4733 Obstructive sleep apnea (adult) (pediatric): Secondary | ICD-10-CM | POA: Diagnosis not present

## 2022-11-29 DIAGNOSIS — E7800 Pure hypercholesterolemia, unspecified: Secondary | ICD-10-CM | POA: Diagnosis not present

## 2022-11-29 MED ORDER — ATORVASTATIN CALCIUM 80 MG PO TABS: 80.0000 mg | ORAL_TABLET | Freq: Every day | ORAL | 3 refills | Status: DC

## 2022-11-29 NOTE — Patient Instructions (Signed)
Medication Instructions:  Continue same medications *If you need a refill on your cardiac medications before your next appointment, please call your pharmacy*   Lab Work: None ordered    Testing/Procedures: None ordered   Follow-Up: At [facility], you and your health needs are our priority.  As part of our continuing mission to provide you with exceptional heart care, we have created designated Provider Care Teams.  These Care Teams include your primary Cardiologist (physician) and Advanced Practice Providers (APPs -  Physician Assistants and Nurse Practitioners) who all work together to provide you with the care you need, when you need it.  We recommend signing up for the patient portal called "MyChart".  Sign up information is provided on this After Visit Summary.  MyChart is used to connect with patients for Virtual Visits (Telemedicine).  Patients are able to view lab/test results, encounter notes, upcoming appointments, etc.  Non-urgent messages can be sent to your provider as well.   To learn more about what you can do with MyChart, go to [email or web address].    Your next appointment:  6 months    Provider:  Dr.Jordan   

## 2022-11-29 NOTE — Progress Notes (Signed)
Cardiology Office Note   Date:  11/29/2022   ID:  Clenard, Shimomura 1951/05/13, MRN [id number]  PCP:  Kristian Covey, MD  Cardiologist:   Shakyla Nolley [place], MD   No chief complaint on file.      History of Present Illness: ALTO MESMER is a 72 y.o. male who is seen for follow up Afib, CAD, AI, and CHF. He has a history of  atrial flutter and CAD. He is status post CABG in 2005. He had a normal stress echo in May of 2013. In August 2015 he presented with atrial flutter with RVR. He had mildly elevated troponins. He had successful TEE guided DCCV. He then underwent ablation of atrial flutter and ectopic atrial tachycardia on 03/30/14 by Dr. Johney Frame.   Stress Myoview September 2015 demonstrated excellent exercise tolerance and normal perfusion. TEE showed normal LV function, mild biatrial enlargement, mild MR,TR,AI. He does have moderate obstructive sleep apnea followed by Dr. Mayford Knife and is on CPAP therapy.  He was seen in December 2018 with  an irregular and fast pulse. Was found to be in AFib with RVR. Echo showed EF 40-45% with moderate MR and AI and moderate biatrial enlargement. Toprol dose was increased and he was started on Eliquis. Underwent TEE guided DCCV on 07/17/17. Echo showed EF 40% with moderate biatrial enlargement. Moderate to severe MR, moderate TR, patent foramen ovale. He was successfully cardioverted but had recurrence of Afib on 07/24/17. He was seen in Afib clinic with Dr. Johney Frame for consideration of Afib ablation versus AAD therapy. There was discussion about starting him on Tikosyn. Given new LV dysfunction and valvular disease it was felt that right and left heart cath were indicated prior to initiation of AAD therapy.  He also wore a monitor that showed episodes of wide complex tachycardia and it was unclear whether this was aberrancy versus VT.  He got a  second opinion at [facility] with Dr. Alden Hipp. He was admitted in February 2019 and had a nuclear stress test showing no  ischemia. EF 44%. He was loaded with Tikosyn and DCCV was performed. he had significant QT prolongation and Tikosyn was discontinued. He later underwent Cardiac MRI. He underwent AFib ablation with pulmonary vein isolation on 10/15/17. Echo following ablation showed EF 50-55%.  Both MRI and Echo showed evidence of moderate to severe AI. LV function had returned to normal with restoration of NSR.  In June 2020 he had recurrent Afib and underwent DCCV at [facility]. On follow up he was noted to be bradycardic and metoprolol dose was reduced. In September 2020 he had an Echo at [facility] showing normal LV function and moderate AI. He is followed regularly by Dr Regino Schultze at [facility]. Seen there in August. Echo done showing moderate AI. Normal LV function.  He was seen there at [facility] in October. Note reports NSR with PACs but Ecg.  He presented to the ER on November 16, 2020 with fast heart beat: SVT vs fib/flutter.  Adenosine given with short break to 10 beats of VT, then back to SVT with RBBB. DCCV was about to be performed, but he converted on his own when they sat the patient up. He was discharged without admission. He sent monitor strips from 4/28 with Afib in the 170s. BB was increased. Strips were faxed to his [facility] cardiologist. Had first occurrence of Afib RVR on March 30 - HR 190s. He converted to NSR. Larey Seat and broke ribs on April 27th. Three dogs jumped on  him and he fell and broke 4 ribs on right side (3-6). No head injury, no LOC. Monitor showed Afib 4/28-5/7 - could be related to his rib injury. On subsequent follow up in the office had no recurrence of Afib.   More recently he had recurrent Afib. He underwent redo AFib and Atrial tachycardia ablation by Dr Alden Hipp at [facility] on 09/12/21. Prior to this he had cardiac MRI showing mod-severe AI, mild LV enlargement - really unchanged from 2019.  Echo in Dec 2022 showed normal LV function and moderate AI.   He was seen by [facility] EP in April  and doing well in NSR. His metoprolol  dose was reduced to 50 mg daily.  Has seen Neurology (Dr Tat) for evaluation of mild Parkinson's symptoms. He is working with an exercise program. Still doing exercise program at [facility] 3 days a week. He has no chest pain, dyspnea or palpitations. Energy level is good. He is in a study for a new medication for his psoriasis. He is doing a Doctor, general practice for his Parkinson's.    Past Medical History:  Diagnosis Date   Chicken pox    Coronary artery disease    Severe three-vessel coronary artery disease with ejection fraction of 65%   GERD (gastroesophageal reflux disease)    History of gout    History of psoriasis    Hypercholesterolemia    Hypertension    OSA (obstructive sleep apnea)    Moderate with AHI of 16.9/hr now on CPAP at 10cm H2O   Psoriatic arthritis (HCC)     Past Surgical History:  Procedure Laterality Date   ATRIAL FLUTTER ABLATION  03/30/2014   ATRIAL FLUTTER ABLATION N/A 03/30/2014   Procedure: ATRIAL FLUTTER ABLATION;  Surgeon: Gardiner Rhyme, MD;  Location: [facility] CATH LAB;  Service: Cardiovascular;  Laterality: N/A;   CARDIAC CATHETERIZATION  09/2003   Ejection fraction was estimated at 65%.   CARDIOVERSION N/A 03/15/2014   Procedure: CARDIOVERSION;  Surgeon: Lewayne Bunting, MD;  Location: [facility] ENDOSCOPY;  Service: Cardiovascular;  Laterality: N/A;   CARDIOVERSION N/A 07/17/2017   Procedure: CARDIOVERSION;  Surgeon: Quintella Reichert, MD;  Location: [facility] ENDOSCOPY;  Service: Cardiovascular;  Laterality: N/A;   CORONARY ARTERY BYPASS GRAFT  09/2003   Lima-lad,svg-diag,svg-om/distal LCX,svg-pda   TEE WITHOUT CARDIOVERSION N/A 03/15/2014   Procedure: TRANSESOPHAGEAL ECHOCARDIOGRAM (TEE);  Surgeon: Lewayne Bunting, MD;  Location: [facility] ENDOSCOPY;  Service: Cardiovascular;  Laterality: N/A;   TEE WITHOUT CARDIOVERSION N/A 07/17/2017   Procedure: TRANSESOPHAGEAL ECHOCARDIOGRAM (TEE);  Surgeon: Quintella Reichert, MD;  Location: [facility] ENDOSCOPY;  Service: Cardiovascular;  Laterality: N/A;    TONSILLECTOMY  1950's     Current Outpatient Medications  Medication Sig Dispense Refill   acetaminophen (TYLENOL) 325 MG tablet Take 2 tablets (650 mg total) by mouth every 6 (six) hours as needed for up to 30 doses for mild pain or moderate pain. 30 tablet 0   atorvastatin (LIPITOR) 80 MG tablet TAKE 1 TABLET BY MOUTH EVERY DAY 90 tablet 1   cholecalciferol (VITAMIN D) 1000 UNITS tablet Take 1,000 Units by mouth daily.     Coenzyme Q10 (CO Q 10 PO) Take 100 mg by mouth daily.      ELIQUIS 5 MG TABS tablet TAKE 1 TABLET BY MOUTH TWICE A DAY 60 tablet 5   furosemide (LASIX) 20 MG tablet TAKE 1 TABLET BY MOUTH TWICE A DAY 180 tablet 3   losartan (COZAAR) 50 MG tablet TAKE 1 TABLET BY MOUTH  EVERY DAY 90 tablet 2   metoprolol succinate (TOPROL-XL) 50 MG 24 hr tablet Take 1 tablet by mouth daily.     multivitamin (THERAGRAN) per tablet Take 1 tablet by mouth daily.     nitroGLYCERIN (NITROSTAT) 0.4 MG SL tablet Place 1 tablet (0.4 mg total) under the tongue every 5 (five) minutes as needed for chest pain. 25 tablet 2   potassium chloride SA (K-DUR,KLOR-CON) 20 MEQ tablet Take 1 tablet (20 mEq total) by mouth daily. 30 tablet 6   No current facility-administered medications for this visit.    Allergies:   Patient has no known allergies.    Social History:  The patient  reports that he has quit smoking. His smoking use included cigarettes. He has a 2.50 pack-year smoking history. He has never used smokeless tobacco. He reports current alcohol use of about 4.0 standard drinks of alcohol per week. He reports that he does not use drugs.   Family History:  The patient's family history includes Cancer in his mother; Heart disease in his mother; Stroke in his mother.    ROS:  Please see the history of present illness.   Otherwise, review of systems are positive for none.   All other systems are reviewed and negative.    PHYSICAL EXAM: VS:  BP 130/66   Pulse 61   Ht 6\' 1"  (1.854 m)   Wt 207 lb  6.4 oz (94.1 kg)   SpO2 95%   BMI 27.36 kg/m  , BMI Body mass index is 27.36 kg/m.  GENERAL:  Well appearing WM in NAD HEENT:  PERRL, EOMI, sclera are clear. Oropharynx is clear. NECK:  No jugular venous distention, carotid upstroke brisk and symmetric, soft left subclavian bruit, no thyromegaly or adenopathy LUNGS:  Clear to auscultation bilaterally CHEST:  Unremarkable HEART:  RRR,   PMI not displaced or sustained,S1 and S2 within normal limits, no S3, no S4: no clicks, no rubs, very soft 1/6 diastolic murmur. ABD:  Soft, nontender. BS +, no masses or bruits. No hepatomegaly, no splenomegaly EXT:  2 + pulses throughout, tr edema, chronic venous stasis changes. SKIN:  Warm and dry.  No rashes NEURO:  Alert and oriented x 3. Cranial nerves II through XII intact. PSYCH:  Cognitively intact   Recent Labs: 03/06/2022: ALT 16; BUN 15; Creatinine, Ser 0.85; Hemoglobin 15.0; Platelets 157.0; Potassium 4.6; Sodium 139; TSH 0.85  Dated 05/08/17: normal CMET and CBC Dated 09/13/21: normal CBC, CMET, Mg  Lipid Panel    Component Value Date/Time   CHOL 131 03/06/2022 0909   CHOL 163 11/30/2019 0827   TRIG 61.0 03/06/2022 0909   HDL 57.20 03/06/2022 0909   HDL 74 11/30/2019 0827   CHOLHDL 2 03/06/2022 0909   VLDL 12.2 03/06/2022 0909   LDLCALC 62 03/06/2022 0909   LDLCALC 75 11/30/2019 0827     Ecg is not done today   Wt Readings from Last 3 Encounters:  11/29/22 207 lb 6.4 oz (94.1 kg)  11/16/22 209 lb (94.8 kg)  10/01/22 200 lb (90.7 kg)     Other studies Reviewed: Additional studies/ records that were reviewed today include: none.  ETT 03/06/17: Study Highlights     Blood pressure demonstrated a hypertensive response to exercise. Upsloping ST segment depression ST segment depression of 1 mm was noted during stress in the II, III, aVF, V4 and V5 leads, and returning to baseline after less than 1 minute of recovery.   1. Good exercise tolerance.  2. Upsloping  ST depression in  inferior leads and V4/V5.  This is nonspecific.    No evidence for ischemia.    Echo 07/08/17: Study Conclusions   - Left ventricle: The cavity size was normal. Wall thickness was   increased in a pattern of moderate LVH. Systolic function was   mildly to moderately reduced. The estimated ejection fraction was   in the range of 40% to 45%. - Aortic valve: There was moderate regurgitation. - Mitral valve: There was moderate regurgitation. - Left atrium: The atrium was moderately dilated. - Right atrium: The atrium was moderately dilated.  TEE 07/17/17: Study Conclusions   - Left ventricle: The estimated ejection fraction was in the range   of 40% to 45%. Mild diffuse hypokinesis with no identifiable   regional variations. No evidence of thrombus. - Aortic valve: There was moderate perivalvular regurgitation. - Mitral valve: No evidence of vegetation. There was moderate to   severe regurgitation, with multiple jets. Diastolic regurgitation   was absent. - Left atrium: The atrium was moderately dilated. No evidence of   thrombus in the atrial cavity or appendage. No evidence of   thrombus in the atrial cavity or appendage. Emptying velocity was   reduced. - Right atrium: The atrium was moderately dilated. - Atrial septum: There was a medium-sized fenestrated patent   foramen ovale. A in the fossa ovalis region was present. There   was a moderate bidirectional shunt through a patent foramen   ovale, in the baseline state. - Tricuspid valve: No evidence of vegetation. There was moderate   regurgitation. - Pulmonic valve: No evidence of vegetation.   Impressions:   - Successful cardioversion. No cardiac source of emboli was   indentified.  Event monitor 07/25/17: Study Highlights   Atrial fibrillation Ventricular rates are frequently elevated Frequent nonsustained ventricular tachycardia as well as aberrantly conducted afib are note No sustained ventricular arrhythmias No  prolonged pauses or AV block   Cardiac MRI 10/14/17: Cardiac MRI and thoracic MRA with and without contrast was performed on a 1.5 T MRI scanner to evaluate myocardial morphology, function, viability and assess pulmonary vein anatomy in a patient with hx of GERD, HTN, hypercholesterolemia, OSA on CPAP, CAD s/p CABG (LIMA to LAD, svg to D1, sequential svg to OM and distal LCx, and svg to PDA), atrial flutter s/p ablation, atrial fibrillation, and wide complex tachycardia thought to due to SVT with aberrancy.  Transthoracic echo demonstrated an LVEF of 40-45%, normal cavity size, moderate MR, and moderate AR.  Transesophageal echo demonstrated moderate aortic regurgitation, moderate-severe mitral regurgitation, and a PFO.  Nuclear stress demonstrated a mildly dilated LV with no perfusion defect and an EF of 44%.  The patient is scheduled for ablation for atrial fibrillation.  Cardiac MRI 1. The left ventricle is moderately dilated.  Wall thickness is normal.  Regional and global LV systolic function are low normal.  The LVEF is calculated at 59%.  2. The right ventricle is normal in cavity size and wall thickness.  Global RV systolic function is low normal to mildly reduced.  The RVEF is calculated at 50%.  3. Both atria are severely enlarged.  4. The aortic valve is trileaflet in morphology. There is no significant aortic stenosis. There is moderate-severe aortic regurgitation.  The regurgitant orifice area measures 0.26 cm^2.  There is reversal of flow in the proximal descending thoracic aorta.  5.  There is  mild-moderate mitral regurgitation.  There is moderate tricuspid regurgitation.  There is mild pulmonic  regurgitation.  6. Delayed enhancement imaging demonstrates no evidence of myocardial infarction, scarring,  or infiltration.  7.  There is no evidence of an intracardiac thrombus.  8.  The pericardium is normal in thickness.  There is no significant pericardial  effusion.  Thoracic MRA 1. There are 3 right sided pulmonary veins.  The right lower and right middle pulmonary veins enter the LA immediately adjacent to one another. The right middle vein is small.  The 2 left sided pulmonary veins enter the LA normally.  All veins are patent without significant stenosis proximally.   MRA bi-orthogonal luminal dimensions are listed below: RUPV: 2.0 x 1.8 cm, 76 cm/sec RMPV: 0.6 x 0.5 cm RLPV: 2.0 x 1.7 cm, 60 cm/ sec LUPV: 1.9 x 1.0 cm, 59 cm/ sec LLPV: 1.9 x 0.9 cm, 60 cm/ sec  MRA LA dimensions Head-foot: 7.1 cm Right-left:  6.2 cm Anterior-posterior:  4.3 cm  2. The thoracic aorta is normal in diameter. There is no evidence of a dissection flap.  3. The main and proximal branch pulmonary arteries are normal in size  4. Systemic venous connections are normal.  5. The esophagus is posterior of the left atrium in close proximity to the ostia of the left-sided pulmonary veins.  The descending thoracic aorta is behind the esophagus and behind the left sided pulmonary Veins.  Echo 10/16/17: INTERPRETATION ---------------------------------------------------------------   NORMAL LEFT VENTRICULAR SYSTOLIC FUNCTION WITH MILD LVH   NORMAL LA PRESSURES WITH DIASTOLIC DYSFUNCTION   NORMAL RIGHT VENTRICULAR SYSTOLIC FUNCTION   VALVULAR REGURGITATION: MODERATE AR, MILD MR, TRIVIAL PR, MILD TR   NO VALVULAR STENOSIS   IRREGULAR RHYTHM   Moderate to severe aortic regurgitation. Mild left ventricular enlargement   suggests color Doppler may underestimate aortic regurgitation severity.   AR VENA CONTRACTA=0.5CM   MILD TO MODERATE TR   NO PRIOR STUDY FOR COMPARISON  Echo 05/02/18: Study Conclusions   - Left ventricle: The cavity size was mildly dilated. Wall   thickness was increased in a pattern of mild LVH. Systolic   function was normal. The estimated ejection fraction was in the   range of 50% to 55%. Wall motion was normal; there were no    regional wall motion abnormalities. Left ventricular diastolic   function parameters were normal. - Aortic valve: There was mild to moderate regurgitation. - Mitral valve: Calcified annulus. There was mild regurgitation. - Left atrium: The atrium was severely dilated. - Tricuspid valve: There was moderate regurgitation. - Pulmonary arteries: Systolic pressure was mildly increased. PA   peak pressure: 41 mm Hg (S).   Impressions:   - Normal LV systolic function; mild LVE and LVH; mild to moderate   AI; mild MR; severe LAE; moderate TR; mild pulmonary   hypertension.  Echo 03/26/19: INTERPRETATION ---------------------------------------------------------------   NORMAL LEFT VENTRICULAR SYSTOLIC FUNCTION   NORMAL LA PRESSURES WITH DIASTOLIC DYSFUNCTION   NORMAL RIGHT VENTRICULAR SYSTOLIC FUNCTION   VALVULAR REGURGITATION: MODERATE AR, MILD MR, TRIVIAL PR, MILD TR   NO VALVULAR STENOSIS   AR VENA CONTRACTA=0.6CM   MILD TO MODERATE TR   NO PRIOR STUDY FOR COMPARISON   Echo 04/11/20: ECHOCARDIOGRAPHIC DESCRIPTIONS -----------------------------------------------  AORTIC ROOT          Size: MILDLY DILATED    Dissection: INDETERM FOR DISSECTION   AORTIC VALVE      Leaflets: Tricuspid             Morphology: MILDLY THICKENED      Mobility: Fully Mobile  LEFT VENTRICLE                                      Anterior: Normal          Size: MILDLY ENLARGED                        Lateral: Normal   Contraction: Normal                                  Septal: Normal    Closest EF: >55%(Estimated)  Calc.EF: 56% (3D)      Apical: Normal     LV masses: No Masses                             Inferior: Normal           LVH: MILD LVH                             Posterior: Normal   LV GLS(LOL): -18.0%   LV GLS(AVG): -16.6% Normal Range [ <= -16]  Dias.FxClass: N/A   MITRAL VALVE      Leaflets: Normal                  Mobility: Fully mobile    Morphology: Normal   LEFT ATRIUM          Size:  MODERATELY ENLARGED     LA masses: No masses                Normal IAS   MAIN PA          Size: DILATED   PULMONIC VALVE    Morphology: Normal      Mobility: Fully Mobile   RIGHT VENTRICLE          Size: Normal                    Free wall: Normal   Contraction: Normal                    RV masses: No Masses         TAPSE:   2.1 cm,  Normal Range [>= 1.6 cm]       RV Note: ANNULAR VELOCITY=10cm/s   TRICUSPID VALVE      Leaflets: Normal                  Mobility: Fully mobile    Morphology: Normal   RIGHT ATRIUM          Size: MODERATELY ENLARGED        RA Other: None     RA masses: No masses   PERICARDIUM         Fluid: No effusion   INFERIOR VENACAVA          Size: DILATED    Normal respiratory collapse   DOPPLER ECHO and OTHER SPECIAL PROCEDURES ------------------------------------     Aortic: MODERATE AR            No AS      Mitral: MILD MR                No MS     MV  Inflow E Vel.= 70.0 cm/s  MV Annulus E'Vel.= 8.5 cm/s  E/E'Ratio= 8   Tricuspid: MILD TR                No TS             2.9 m/s peak TR vel   41 mmHg peak RV pressure   Pulmonary: TRIVIAL PR             No PS       Other:   INTERPRETATION ---------------------------------------------------------------    NORMAL LEFT VENTRICULAR SYSTOLIC FUNCTION WITH MILD LVH    NORMAL RIGHT VENTRICULAR SYSTOLIC FUNCTION    VALVULAR REGURGITATION: MODERATE AR, MILD MR, TRIVIAL PR, MILD TR    NO VALVULAR STENOSIS    MILDLY DILATED LEFT VENTRICLE SUGGESTS POSSIBILITY THAT ECHOCARDIOGRAPHY    UNDERESTIMATES AORTIC REGURGITATION SEVERITY.    AR VENA CONTRACTA=0.6cm    3D acquisition and reconstructions were performed as part of this    examination to more accurately quantify the effects of moderate or greater    valve regurgitation. (post-processing on an Independent workstation).     Compared with prior Echo study on 03/26/2019: NO SIGNIFICANT CHANGES.    Event monitor 12/07/20: Study Highlights  Atrial  fibrillation with RVR and intermittent aberrancy. Patient had returned to NSR on 12/01/20    Echo 07/04/21: INTERPRETATION ---------------------------------------------------------------    NORMAL LEFT VENTRICULAR SYSTOLIC FUNCTION WITH MILD LVH    NORMAL RIGHT VENTRICULAR SYSTOLIC FUNCTION    VALVULAR REGURGITATION: MODERATE AR, MILD MR, MILD PR, MILD TR    NO VALVULAR STENOSIS    IRREGULAR RHYTHM THROUGHOUT EXAM    AR VENA CONTRACTA = 0.5 cm    3D acquisition and reconstructions were performed as part of this    examination to more accurately quantify the effects of identified    structural abnormalities as part of the exam. (post-processing on an    Independent workstation).     Compared with prior Echo study on 04/11/2020: MILD TO MODERATE TR    SEVERE BIATRIAL ENLARGEMENT. INCREASED RV DIMENSIONS    NO SIGNIFICANT CHANGE IN AR OR LV FUNCTION    Cardiac MRI 09/11/21: SUMMARY   ==========================================================================================================   Cardiac MRI and thoracic MRA were performed  with and without contrast on a 3.0 T MRI scanner to evaluate  myocardial morphology, function, viability and pulmonary vein anatomy in a 72 y/o patient with hx of GERD,  HTN, hypercholesterolemia, OSA on CPAP, CAD s/p CABG (LIMA to LAD, svg to D1, sequential svg to OM and  distal LCx, and svg to PDA), atrial flutter s/p ablation, PFO, atrial fibrillation, wide complex  tachycardia thought to due to SVT with aberrancy, aortic regurgitation, and mitral regurgitation who is  scheduled for afib ablation.  Comparisons were made to the prior scan from 10/14/2017.   The cardiac rhythm was atrial fibrillation. Real time imaging was used.  Regurgitant valvular lesions could  not be fully assessed due to the use of real time imaging.  Comprehensive assessment could be performed  following conversion to NSR.   Cardiac MRI  1. The left ventricle is moderately dilated.   Wall thickness is normal.  There are no regional wall motion  abnormalities. During atrial fibrillation, there is low normal to mildly reduced function with an LVEF  visually estimated at 50-55%.   2. The right ventricle is normal in cavity size and wall thickness.  Global RV systolic function is low  normal to mildly reduced.   3.  Both atria are severely enlarged.   4. The aortic valve is trileaflet in morphology. There is no significant aortic stenosis. There is at least  moderate-severe aortic regurgitation.   5.  There is at least mild-moderate mitral regurgitation.  There is at least moderate tricuspid  regurgitation. There is mild pulmonic regurgitation.   6. Delayed enhancement imaging demonstrates no evidence of myocardial infarction, scarring,  or  infiltration.   7.  There is no evidence of an intracardiac thrombus.   8.  The pericardium is normal in thickness.  There is no significant pericardial effusion.   Thoracic MRA:  1. There are 3 right sided pulmonary veins.  The right lower and right middle pulmonary veins enter the LA  immediately adjacent to one another. The right middle vein is small.   The 2 left sided pulmonary veins enter the LA normally.   All veins are patent without significant stenosis proximally.   MRA bi-orthogonal luminal dimensions are listed below:  RUPV: 2.1 x 1.9 cm (prior 2.0 x 1.8 cm, 76 cm/sec)  RMPV: 0.7 x 0.6 cm (prior 0.6 x 0.5 cm)  RLPV: 2.1 x 1.7 cm (prior 2.0 x 1.7 cm, 60 cm/ sec)  LUPV: 1.9 x 1.1 cm (prior 1.9 x 1.0 cm, 59 cm/ sec)  LLPV: 1.7 x 1.0 cm (prior 1.9 x 0.9 cm, 60 cm/ sec)   MRA LA dimensions  Head-foot: 6.9 cm  Right-left: 6.8 cm  Anterior-posterior: 4.5 cm   2. The thoracic aorta is normal in diameter. There is no evidence of a dissection flap.   3. The main and proximal branch pulmonary arteries are normal in size   4. Systemic venous connections are normal.   5. The esophagus traverses posterior to ostia of  the left-sided pulmonary veins.  The descending thoracic  aorta is behind the esophagus and behind the left sided pulmonary veins.   Echo 07/26/22:   INTERPRETATION ---------------------------------------------------------------    NORMAL LEFT VENTRICULAR SYSTOLIC FUNCTION WITH MILD LVH    NORMAL RIGHT VENTRICULAR SYSTOLIC FUNCTION    VALVULAR REGURGITATION: MODERATE AR, MILD MR, TRIVIAL PR, MILD TR    NO VALVULAR STENOSIS     Compared with prior Echo study on 07/04/2021: MILD TO MODERATE TR. NO    SIGNIFICANT CHANGE IN AR OR LV SIZE/FUNCTION.   ASSESSMENT AND PLAN:  1. Atrial fibriillation with RVR. Prior history of atrial flutter and atrial tachycardia ablation in 2015. On anticoagulation with Eliquis 5 mg bid. [place] vasc score of 3. On  Toprol XL.   Failed Tikosyn due to QT prolongation. Now s/p repeat Afib ablation at [facility] on 10/15/17. Recurrent Afib in June 2020 with repeat DCCV. Recurrent Afib with another Ablation at [facility] in Feb 2023. Maintaining NSR. Ecg last month at [facility] showed NSR with PACs.   2. Coronary disease status post CABG in 2005. He is without chest pain. He had a normal Myoview study September 2015. Low risk ETT in August 2018. Repeat nuclear  study at [facility] 2019 showed no ischemia. EF 44%.   3. Chronic Aortic insufficiency- moderate to severe.  Pulse pressure normal. No symptoms. Repeat Echo in Jan  at [facility] showed no change. Moderate AI. Normal LV size and function. MRI stable from 2019.  4. 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   5. Atrial flutter s/p ablation.   6. OSA- now on CPAP. Followed by Dr. Mayford Knife.  7. Hyperlipidemia on statin. Last labs in August looked great.   8. HTN -  BP is under good control  9. Left subclavian stenosis. Asymptomatic.     Disposition: FU with me in 6 months  Signed, Dandra Velardi [place], MD  11/29/2022 8:23 AM    [facility]

## 2022-12-03 ENCOUNTER — Ambulatory Visit: Payer: Medicare HMO | Admitting: Cardiology

## 2022-12-11 ENCOUNTER — Ambulatory Visit: Payer: Medicare HMO | Admitting: Cardiology

## 2022-12-27 ENCOUNTER — Ambulatory Visit (INDEPENDENT_AMBULATORY_CARE_PROVIDER_SITE_OTHER): Payer: Medicare HMO | Admitting: Family Medicine

## 2022-12-27 ENCOUNTER — Encounter: Payer: Self-pay | Admitting: Family Medicine

## 2022-12-27 VITALS — BP 112/64 | HR 64 | Temp 98.0°F | Wt 207.6 lb

## 2022-12-27 DIAGNOSIS — M25561 Pain in right knee: Secondary | ICD-10-CM

## 2022-12-27 DIAGNOSIS — M109 Gout, unspecified: Secondary | ICD-10-CM

## 2022-12-27 MED ORDER — PREDNISONE 10 MG PO TABS: ORAL_TABLET | ORAL | 0 refills | Status: DC

## 2022-12-27 NOTE — Progress Notes (Signed)
Established Patient Office Visit   Subjective  Patient ID: [name] [name], male    DOB: 02-08-1951  Age: 72 y.o. MRN: [id number]  Chief Complaint  Patient presents with   Knee Pain    Right knee pain since Monday. Thinks and feels like it is gout. Just hurts, has taken ibuprofen, it take the edge off but does not take pain away    Pt is a 72 yo male followed by Dr. Caryl Never and seen for acute concern.  Patient endorses right knee tenderness starting 4 days ago.  Knee became more painful, warm to touch, and swollen similar to patient's previous gout flares.  Patient denies changes in diet.  Did have a few beers last week and could drink more water.  Took ibuprofen which helped some.  In the past colchicine has been less effective.  Knee Pain     Past Medical History:  Diagnosis Date   Chicken pox    Coronary artery disease    Severe three-vessel coronary artery disease with ejection fraction of 65%   GERD (gastroesophageal reflux disease)    History of gout    History of psoriasis    Hypercholesterolemia    Hypertension    OSA (obstructive sleep apnea)    Moderate with AHI of 16.9/hr now on CPAP at 10cm H2O   Psoriatic arthritis (HCC)    Past Surgical History:  Procedure Laterality Date   ATRIAL FLUTTER ABLATION  03/30/2014   ATRIAL FLUTTER ABLATION N/A 03/30/2014   Procedure: ATRIAL FLUTTER ABLATION;  Surgeon: Gardiner Rhyme, MD;  Location: [facility] CATH LAB;  Service: Cardiovascular;  Laterality: N/A;   CARDIAC CATHETERIZATION  09/2003   Ejection fraction was estimated at 65%.   CARDIOVERSION N/A 03/15/2014   Procedure: CARDIOVERSION;  Surgeon: Lewayne Bunting, MD;  Location: [facility] ENDOSCOPY;  Service: Cardiovascular;  Laterality: N/A;   CARDIOVERSION N/A 07/17/2017   Procedure: CARDIOVERSION;  Surgeon: Quintella Reichert, MD;  Location: [facility] ENDOSCOPY;  Service: Cardiovascular;  Laterality: N/A;   CORONARY ARTERY BYPASS GRAFT  09/2003   [place]-lad,svg-diag,svg-om/distal LCX,svg-pda   TEE  WITHOUT CARDIOVERSION N/A 03/15/2014   Procedure: TRANSESOPHAGEAL ECHOCARDIOGRAM (TEE);  Surgeon: Lewayne Bunting, MD;  Location: [facility] ENDOSCOPY;  Service: Cardiovascular;  Laterality: N/A;   TEE WITHOUT CARDIOVERSION N/A 07/17/2017   Procedure: TRANSESOPHAGEAL ECHOCARDIOGRAM (TEE);  Surgeon: Quintella Reichert, MD;  Location: [facility] ENDOSCOPY;  Service: Cardiovascular;  Laterality: N/A;   TONSILLECTOMY  1950's   Social History   Tobacco Use   Smoking status: Former    Packs/day: 0.50    Years: 5.00    Additional pack years: 0.00    Total pack years: 2.50    Types: Cigarettes   Smokeless tobacco: Never   Tobacco comments:    "quit smoking cigarettes in the late 1970's"  Vaping Use   Vaping Use: Never used  Substance Use Topics   Alcohol use: Yes    Alcohol/week: 4.0 standard drinks of alcohol    Types: 2 Cans of beer, 2 Shots of liquor per week    Comment: 3 times per week, 2-3 drinks each time   Drug use: No   Family History  Problem Relation Age of Onset   Heart disease Mother        cabg   Cancer Mother        breast   Stroke Mother    No Known Allergies    ROS Negative unless stated above    Objective:  BP 112/64 (BP Location: Left Arm, Patient Position: Sitting, Cuff Size: Large)   Pulse 64   Temp 98 F (36.7 C) (Oral)   Wt 207 lb 9.6 oz (94.2 kg)   SpO2 98%   BMI 27.39 kg/m    Physical Exam Constitutional:      General: He is not in acute distress.    Appearance: Normal appearance.  HENT:     Head: Normocephalic and atraumatic.     Nose: Nose normal.     Mouth/Throat:     Mouth: Mucous membranes are moist.  Cardiovascular:     Rate and Rhythm: Normal rate and regular rhythm.     Heart sounds: Normal heart sounds. No murmur heard.    No gallop.  Pulmonary:     Effort: Pulmonary effort is normal. No respiratory distress.     Breath sounds: Normal breath sounds. No wheezing, rhonchi or rales.  Musculoskeletal:     Right knee: Swelling present.  Tenderness present.     Left knee: Normal.     Comments: Decreased ROM right knee.  Increased warmth and mild erythema medially.  Skin:    General: Skin is warm and dry.  Neurological:     Mental Status: He is alert and oriented to person, place, and time.      No results found for any visits on 12/27/22.    Assessment & Plan:  Acute gout of right knee, unspecified cause -     predniSONE; Take 4 tabs every morning for 3 days, 3 tabs for 2 days, 2 tabs for 2 days, 1 tab for 1 day.  Dispense: 23 tablet; Refill: 0  Acute pain of right knee  New problem.  Patient with known history of gout.  Symptoms similar to previous gout flares.  Start prednisone taper as colchicine has recently been less effective.  Patient advised to increase p.o. intake of water and fluids.  Ibuprofen as needed.  Return if symptoms worsen or fail to improve.   Deeann Saint, MD

## 2022-12-28 ENCOUNTER — Encounter: Payer: Self-pay | Admitting: Cardiology

## 2022-12-28 DIAGNOSIS — G4733 Obstructive sleep apnea (adult) (pediatric): Secondary | ICD-10-CM

## 2022-12-28 DIAGNOSIS — I2510 Atherosclerotic heart disease of native coronary artery without angina pectoris: Secondary | ICD-10-CM

## 2022-12-28 DIAGNOSIS — I10 Essential (primary) hypertension: Secondary | ICD-10-CM

## 2022-12-30 DIAGNOSIS — G4733 Obstructive sleep apnea (adult) (pediatric): Secondary | ICD-10-CM | POA: Diagnosis not present

## 2023-01-16 ENCOUNTER — Other Ambulatory Visit: Payer: Self-pay | Admitting: Cardiology

## 2023-01-29 DIAGNOSIS — G4733 Obstructive sleep apnea (adult) (pediatric): Secondary | ICD-10-CM | POA: Diagnosis not present

## 2023-02-01 ENCOUNTER — Encounter: Payer: Self-pay | Admitting: Neurology

## 2023-02-01 DIAGNOSIS — M10042 Idiopathic gout, left hand: Secondary | ICD-10-CM | POA: Diagnosis not present

## 2023-02-22 ENCOUNTER — Encounter: Payer: Self-pay | Admitting: Family Medicine

## 2023-02-22 ENCOUNTER — Ambulatory Visit: Payer: Medicare HMO

## 2023-02-22 ENCOUNTER — Ambulatory Visit (INDEPENDENT_AMBULATORY_CARE_PROVIDER_SITE_OTHER): Payer: Medicare HMO | Admitting: Family Medicine

## 2023-02-22 VITALS — BP 120/60 | HR 61 | Temp 98.1°F | Ht 73.0 in | Wt 197.6 lb

## 2023-02-22 VITALS — BP 120/60 | HR 61 | Temp 98.1°F | Ht 73.0 in | Wt 197.4 lb

## 2023-02-22 DIAGNOSIS — M1A9XX0 Chronic gout, unspecified, without tophus (tophi): Secondary | ICD-10-CM

## 2023-02-22 DIAGNOSIS — Z1211 Encounter for screening for malignant neoplasm of colon: Secondary | ICD-10-CM | POA: Diagnosis not present

## 2023-02-22 DIAGNOSIS — Z0000 Encounter for general adult medical examination without abnormal findings: Secondary | ICD-10-CM | POA: Diagnosis not present

## 2023-02-22 DIAGNOSIS — M79642 Pain in left hand: Secondary | ICD-10-CM

## 2023-02-22 MED ORDER — PREDNISONE 20 MG PO TABS: ORAL_TABLET | ORAL | 0 refills | Status: DC

## 2023-02-22 NOTE — Patient Instructions (Signed)
Let me know if hand inflammation not clearing after this treatment.

## 2023-02-22 NOTE — Patient Instructions (Addendum)
[name] , Thank you for taking time to come for your Medicare Wellness Visit. I appreciate your ongoing commitment to your health goals. Please review the following plan we discussed and let me know if I can assist you in the future.   Referrals/Orders/Follow-Ups/Clinician Recommendations:   This is a list of the screening recommended for you and due dates:  Health Maintenance  Topic Date Due   Colon Cancer Screening  11/06/2021   COVID-19 Vaccine (6 - 2023-24 season) 03/23/2022   Flu Shot  02/21/2023   Medicare Annual Wellness Visit  02/22/2024   DTaP/Tdap/Td vaccine (3 - Tdap) 05/03/2025   Pneumonia Vaccine  Completed   Hepatitis C Screening  Completed   Zoster (Shingles) Vaccine  Completed   HPV Vaccine  Aged Out    Advanced directives: (Copy Requested) Please bring a copy of your health care power of attorney and living will to the office to be added to your chart at your convenience.  Next Medicare Annual Wellness Visit scheduled for next year: Yes  Preventive Care 16 Years and Older, Male  Preventive care refers to lifestyle choices and visits with your health care provider that can promote health and wellness. What does preventive care include? A yearly physical exam. This is also called an annual well check. Dental exams once or twice a year. Routine eye exams. Ask your health care provider how often you should have your eyes checked. Personal lifestyle choices, including: Daily care of your teeth and gums. Regular physical activity. Eating a healthy diet. Avoiding tobacco and drug use. Limiting alcohol use. Practicing safe sex. Taking low doses of aspirin every day. Taking vitamin and mineral supplements as recommended by your health care provider. What happens during an annual well check? The services and screenings done by your health care provider during your annual well check will depend on your age, overall health, lifestyle risk factors, and family history of  disease. Counseling  Your health care provider may ask you questions about your: Alcohol use. Tobacco use. Drug use. Emotional well-being. Home and relationship well-being. Sexual activity. Eating habits. History of falls. Memory and ability to understand (cognition). Work and work Astronomer. Screening  You may have the following tests or measurements: Height, weight, and BMI. Blood pressure. Lipid and cholesterol levels. These may be checked every 5 years, or more frequently if you are over 56 years old. Skin check. Lung cancer screening. You may have this screening every year starting at age 14 if you have a 30-pack-year history of smoking and currently smoke or have quit within the past 15 years. Fecal occult blood test (FOBT) of the stool. You may have this test every year starting at age 85. Flexible sigmoidoscopy or colonoscopy. You may have a sigmoidoscopy every 5 years or a colonoscopy every 10 years starting at age 34. Prostate cancer screening. Recommendations will vary depending on your family history and other risks. Hepatitis C blood test. Hepatitis B blood test. Sexually transmitted disease (STD) testing. Diabetes screening. This is done by checking your blood sugar (glucose) after you have not eaten for a while (fasting). You may have this done every 1-3 years. Abdominal aortic aneurysm (AAA) screening. You may need this if you are a current or former smoker. Osteoporosis. You may be screened starting at age 57 if you are at high risk. Talk with your health care provider about your test results, treatment options, and if necessary, the need for more tests. Vaccines  Your health care provider may  recommend certain vaccines, such as: Influenza vaccine. This is recommended every year. Tetanus, diphtheria, and acellular pertussis (Tdap, Td) vaccine. You may need a Td booster every 10 years. Zoster vaccine. You may need this after age 24. Pneumococcal 13-valent  conjugate (PCV13) vaccine. One dose is recommended after age 44. Pneumococcal polysaccharide (PPSV23) vaccine. One dose is recommended after age 34. Talk to your health care provider about which screenings and vaccines you need and how often you need them. This information is not intended to replace advice given to you by your health care provider. Make sure you discuss any questions you have with your health care provider. Document Released: 08/05/2015 Document Revised: 03/28/2016 Document Reviewed: 05/10/2015 Elsevier Interactive Patient Education  2017 [employer].  Fall Prevention in the Home Falls can cause injuries. They can happen to people of all ages. There are many things you can do to make your home safe and to help prevent falls. What can I do on the outside of my home? Regularly fix the edges of walkways and driveways and fix any cracks. Remove anything that might make you trip as you walk through a door, such as a raised step or threshold. Trim any bushes or trees on the path to your home. Use bright outdoor lighting. Clear any walking paths of anything that might make someone trip, such as rocks or tools. Regularly check to see if handrails are loose or broken. Make sure that both sides of any steps have handrails. Any raised decks and porches should have guardrails on the edges. Have any leaves, snow, or ice cleared regularly. Use sand or salt on walking paths during winter. Clean up any spills in your garage right away. This includes oil or grease spills. What can I do in the bathroom? Use night lights. Install grab bars by the toilet and in the tub and shower. Do not use towel bars as grab bars. Use non-skid mats or decals in the tub or shower. If you need to sit down in the shower, use a plastic, non-slip stool. Keep the floor dry. Clean up any water that spills on the floor as soon as it happens. Remove soap buildup in the tub or shower regularly. Attach bath mats  securely with double-sided non-slip rug tape. Do not have throw rugs and other things on the floor that can make you trip. What can I do in the bedroom? Use night lights. Make sure that you have a light by your bed that is easy to reach. Do not use any sheets or blankets that are too big for your bed. They should not hang down onto the floor. Have a firm chair that has side arms. You can use this for support while you get dressed. Do not have throw rugs and other things on the floor that can make you trip. What can I do in the kitchen? Clean up any spills right away. Avoid walking on wet floors. Keep items that you use a lot in easy-to-reach places. If you need to reach something above you, use a strong step stool that has a grab bar. Keep electrical cords out of the way. Do not use floor polish or wax that makes floors slippery. If you must use wax, use non-skid floor wax. Do not have throw rugs and other things on the floor that can make you trip. What can I do with my stairs? Do not leave any items on the stairs. Make sure that there are handrails on both sides of  the stairs and use them. Fix handrails that are broken or loose. Make sure that handrails are as long as the stairways. Check any carpeting to make sure that it is firmly attached to the stairs. Fix any carpet that is loose or worn. Avoid having throw rugs at the top or bottom of the stairs. If you do have throw rugs, attach them to the floor with carpet tape. Make sure that you have a light switch at the top of the stairs and the bottom of the stairs. If you do not have them, ask someone to add them for you. What else can I do to help prevent falls? Wear shoes that: Do not have high heels. Have rubber bottoms. Are comfortable and fit you well. Are closed at the toe. Do not wear sandals. If you use a stepladder: Make sure that it is fully opened. Do not climb a closed stepladder. Make sure that both sides of the stepladder  are locked into place. Ask someone to hold it for you, if possible. Clearly mark and make sure that you can see: Any grab bars or handrails. First and last steps. Where the edge of each step is. Use tools that help you move around (mobility aids) if they are needed. These include: Canes. Walkers. Scooters. Crutches. Turn on the lights when you go into a dark area. Replace any light bulbs as soon as they burn out. Set up your furniture so you have a clear path. Avoid moving your furniture around. If any of your floors are uneven, fix them. If there are any pets around you, be aware of where they are. Review your medicines with your doctor. Some medicines can make you feel dizzy. This can increase your chance of falling. Ask your doctor what other things that you can do to help prevent falls. This information is not intended to replace advice given to you by your health care provider. Make sure you discuss any questions you have with your health care provider. Document Released: 05/05/2009 Document Revised: 12/15/2015 Document Reviewed: 08/13/2014 Elsevier Interactive Patient Education  2017 [employer].

## 2023-02-22 NOTE — Progress Notes (Signed)
Subjective:   [name] [name] is a 72 y.o. male who presents for Medicare Annual/Subsequent preventive examination.  Visit Complete: In person  Patient Medicare AWV questionnaire was completed by the patient on 02/21/23; I have confirmed that all information answered by patient is correct and no changes since this date.  Review of Systems     Cardiac Risk Factors include: advanced age (>75men, >57 women);male gender;hypertension     Objective:    Today's Vitals   02/22/23 1002  BP: 120/60  Pulse: 61  Temp: 98.1 F (36.7 C)  TempSrc: Oral  SpO2: 96%  Weight: 197 lb 6.4 oz (89.5 kg)  Height: 6\' 1"  (1.854 m)   Body mass index is 26.04 kg/m.     02/22/2023   10:34 AM 06/05/2022   10:42 AM 02/16/2022   10:35 AM 03/02/2021    8:58 AM 01/30/2021   10:00 AM 11/16/2020    4:12 PM 07/21/2019   11:21 AM  Advanced Directives  Does Patient Have a Medical Advance Directive? Yes Yes Yes Yes Yes Yes Yes  Type of Estate agent of [place];Living will  Healthcare Power of [employer];Living will  Healthcare Power of [place];Living will Healthcare Power of [place];Living will Healthcare Power of [place];Living will  Does patient want to make changes to medical advance directive?   No - Patient declined Yes (Inpatient - patient defers changing a medical advance directive and declines information at this time)   No - Patient declined  Copy of Healthcare Power of Attorney in Chart? No - copy requested  No - copy requested  No - copy requested  No - copy requested    Current Medications (verified) Outpatient Encounter Medications as of 02/22/2023  Medication Sig   acetaminophen (TYLENOL) 325 MG tablet Take 2 tablets (650 mg total) by mouth every 6 (six) hours as needed for up to 30 doses for mild pain or moderate pain.   atorvastatin (LIPITOR) 80 MG tablet Take 1 tablet (80 mg total) by mouth daily.   cholecalciferol (VITAMIN D) 1000 UNITS tablet Take 1,000 Units by mouth  daily.   Coenzyme Q10 (CO Q 10 PO) Take 100 mg by mouth daily.    ELIQUIS 5 MG TABS tablet TAKE 1 TABLET BY MOUTH TWICE A DAY   furosemide (LASIX) 20 MG tablet TAKE 1 TABLET BY MOUTH TWICE A DAY   losartan (COZAAR) 50 MG tablet TAKE 1 TABLET BY MOUTH EVERY DAY   metoprolol succinate (TOPROL-XL) 50 MG 24 hr tablet Take 1 tablet by mouth daily.   multivitamin (THERAGRAN) per tablet Take 1 tablet by mouth daily.   nitroGLYCERIN (NITROSTAT) 0.4 MG SL tablet Place 1 tablet (0.4 mg total) under the tongue every 5 (five) minutes as needed for chest pain.   potassium chloride SA (K-DUR,KLOR-CON) 20 MEQ tablet Take 1 tablet (20 mEq total) by mouth daily.   [DISCONTINUED] predniSONE (DELTASONE) 10 MG tablet Take 4 tabs every morning for 3 days, 3 tabs for 2 days, 2 tabs for 2 days, 1 tab for 1 day.   No facility-administered encounter medications on file as of 02/22/2023.    Allergies (verified) Patient has no known allergies.   History: Past Medical History:  Diagnosis Date   Chicken pox    Coronary artery disease    Severe three-vessel coronary artery disease with ejection fraction of 65%   GERD (gastroesophageal reflux disease)    History of gout    History of psoriasis    Hypercholesterolemia  Hypertension    OSA (obstructive sleep apnea)    Moderate with AHI of 16.9/hr now on CPAP at 10cm H2O   Psoriatic arthritis [facility])    Past Surgical History:  Procedure Laterality Date   ATRIAL FLUTTER ABLATION  03/30/2014   ATRIAL FLUTTER ABLATION N/A 03/30/2014   Procedure: ATRIAL FLUTTER ABLATION;  Surgeon: Gardiner Rhyme, MD;  Location: [facility] CATH LAB;  Service: Cardiovascular;  Laterality: N/A;   CARDIAC CATHETERIZATION  09/2003   Ejection fraction was estimated at 65%.   CARDIOVERSION N/A 03/15/2014   Procedure: CARDIOVERSION;  Surgeon: Lewayne Bunting, MD;  Location: [facility] ENDOSCOPY;  Service: Cardiovascular;  Laterality: N/A;   CARDIOVERSION N/A 07/17/2017   Procedure: CARDIOVERSION;  Surgeon:  Quintella Reichert, MD;  Location: [facility] ENDOSCOPY;  Service: Cardiovascular;  Laterality: N/A;   CORONARY ARTERY BYPASS GRAFT  09/2003   [place]-lad,svg-diag,svg-om/distal LCX,svg-pda   TEE WITHOUT CARDIOVERSION N/A 03/15/2014   Procedure: TRANSESOPHAGEAL ECHOCARDIOGRAM (TEE);  Surgeon: Lewayne Bunting, MD;  Location: [facility] ENDOSCOPY;  Service: Cardiovascular;  Laterality: N/A;   TEE WITHOUT CARDIOVERSION N/A 07/17/2017   Procedure: TRANSESOPHAGEAL ECHOCARDIOGRAM (TEE);  Surgeon: Quintella Reichert, MD;  Location: [facility] ENDOSCOPY;  Service: Cardiovascular;  Laterality: N/A;   TONSILLECTOMY  1950's   Family History  Problem Relation Age of Onset   Heart disease Mother        cabg   Cancer Mother        breast   Stroke Mother    Social History   Socioeconomic History   Marital status: Married    Spouse name: Not on file   Number of children: 1   Years of education: 4 years college   Highest education level: Bachelor's degree (e.g., BA, AB, BS)  Occupational History   Occupation: retired    Associate Professor: [employer]  Tobacco Use   Smoking status: Former    Current packs/day: 0.50    Average packs/day: 0.5 packs/day for 5.0 years (2.5 ttl pk-yrs)    Types: Cigarettes   Smokeless tobacco: Never   Tobacco comments:    "quit smoking cigarettes in the late 1970's"  Vaping Use   Vaping status: Never Used  Substance and Sexual Activity   Alcohol use: Yes    Alcohol/week: 4.0 standard drinks of alcohol    Types: 2 Cans of beer, 2 Shots of liquor per week    Comment: 3 times per week, 2-3 drinks each time   Drug use: No   Sexual activity: Yes  Other Topics Concern   Not on file  Social History Narrative   Working part time   Married; HH of 2   1 child   Right handed   Retired    Chemical engineer Strain: Low Risk  (02/22/2023)   Overall Financial Resource Strain (CARDIA)    Difficulty of Paying Living Expenses: Not hard at all  Food Insecurity:  No Food Insecurity (02/22/2023)   Hunger Vital Sign    Worried About Running Out of Food in the Last Year: Never true    Ran Out of Food in the Last Year: Never true  Transportation Needs: No Transportation Needs (02/22/2023)   PRAPARE - Administrator, Civil Service (Medical): No    Lack of Transportation (Non-Medical): No  Physical Activity: Sufficiently Active (02/22/2023)   Exercise Vital Sign    Days of Exercise per Week: 3 days    Minutes of Exercise per Session: 60 min  Stress: No Stress Concern Present (02/22/2023)   [employer] of Occupational Health - Occupational Stress Questionnaire    Feeling of Stress : Not at all  Social Connections: Socially Integrated (02/22/2023)   Social Connection and Isolation Panel [NHANES]    Frequency of Communication with Friends and Family: More than three times a week    Frequency of Social Gatherings with Friends and Family: More than three times a week    Attends Religious Services: More than 4 times per year    Active Member of [employer] or Organizations: Yes    Attends Engineer, structural: More than 4 times per year    Marital Status: Married    Tobacco Counseling Counseling given: Not Answered Tobacco comments: "quit smoking cigarettes in the late 1970's"   Clinical Intake:  Pre-visit preparation completed: Yes  Pain : No/denies pain     BMI - recorded: 26.04 Nutritional Status: BMI 25 -29 Overweight Nutritional Risks: None Diabetes: No  How often do you need to have someone help you when you read instructions, pamphlets, or other written materials from your doctor or pharmacy?: 1 - Never  Interpreter Needed?: No  Information entered by :: Theresa Mulligan LPN   Activities of Daily Living    02/22/2023   10:31 AM 02/21/2023   11:05 AM  In your present state of health, do you have any difficulty performing the following activities:  Hearing? 0 0  Vision? 0 0  Difficulty concentrating or making decisions?  0 0  Walking or climbing stairs? 0 0  Dressing or bathing? 0 0  Doing errands, shopping? 0 0  Preparing Food and eating ? N N  Using the Toilet? N N  In the past six months, have you accidently leaked urine? N N  Do you have problems with loss of bowel control? N N  Managing your Medications? N N  Managing your Finances? N N  Housekeeping or managing your Housekeeping? N N    Patient Care Team: Kristian Covey, MD as PCP - General (Family Medicine) [place], Peter M, MD as PCP - Cardiology (Cardiology) Verner Chol, [facility] (Inactive) as Pharmacist (Pharmacist)  Indicate any recent Medical Services you may have received from other than [facility] providers in the past year (date may be approximate).     Assessment:   This is a routine wellness examination for Lovis.  Hearing/Vision screen Hearing Screening - Comments:: Denies hearing difficulties   Vision Screening - Comments:: Wears reading glasses - up to date with routine eye exams with  [facility]  Dietary issues and exercise activities discussed:     Goals Addressed               This Visit's Progress     Continue daily activities. (pt-stated)        Continue to lose weight.       Depression Screen    02/22/2023   10:09 AM 12/27/2022    9:36 AM 12/27/2022    9:33 AM 07/06/2022    2:23 PM 03/06/2022    8:28 AM 02/16/2022   10:31 AM 09/18/2021    2:08 PM  PHQ 2/9 Scores  PHQ - 2 Score 0 0 0 0 0 0 1  PHQ- 9 Score 0 0  0 0  3    Fall Risk    02/22/2023   10:32 AM 02/21/2023   11:05 AM 12/27/2022    9:33 AM 07/06/2022    2:24 PM 07/05/2022  7:20 PM  Fall Risk   Falls in the past year? 0 0 0 0 0  Number falls in past yr: 0 0 0 0   Injury with Fall? 0 0 0 0   Risk for fall due to : No Fall Risks  No Fall Risks No Fall Risks   Follow up Falls prevention discussed  Falls evaluation completed Falls evaluation completed     MEDICARE RISK AT HOME:  Medicare Risk at Home - 02/22/23 1043     Any stairs in or  around the home? Yes    If so, are there any without handrails? No    Home free of loose throw rugs in walkways, pet beds, electrical cords, etc? Yes    Adequate lighting in your home to reduce risk of falls? Yes    Life alert? No    Use of a cane, walker or w/c? No    Grab bars in the bathroom? Yes    Shower chair or bench in shower? No    Elevated toilet seat or a handicapped toilet? No             TIMED UP AND GO:  Was the test performed?  Yes  Length of time to ambulate 10 feet: 10 sec Gait steady and fast without use of assistive device    Cognitive Function:    04/08/2018    8:26 AM 03/14/2017    1:39 PM  MMSE - Mini Mental State Exam  Not completed: -- --        02/22/2023   10:34 AM 02/16/2022   10:35 AM 07/21/2019   11:26 AM  6CIT Screen  What Year? 0 points 0 points 0 points  What month? 0 points 0 points 0 points  What time? 0 points 0 points 0 points  Count back from 20 0 points 0 points 0 points  Months in reverse 0 points 0 points 0 points  Repeat phrase 0 points 0 points 0 points  Total Score 0 points 0 points 0 points    Immunizations Immunization History  Administered Date(s) Administered   Fluad Quad(high Dose 65+) 06/09/2019, 05/07/2022   Hep A, Unspecified 08/05/2000   Influenza, High Dose Seasonal PF 05/06/2017, 06/10/2018   Influenza,inj,Quad PF,6+ Mos 08/20/2013, 03/31/2014   Influenza-Unspecified 05/14/2017, 05/03/2021   Moderna SARS-COV2 Booster Vaccination 05/23/2020, 12/01/2020, 04/02/2021   Moderna Sars-Covid-2 Vaccination 08/17/2019, 09/17/2019   Pneumococcal Conjugate-13 09/18/2005, 08/20/2013   Pneumococcal Polysaccharide-23 02/26/2020   Td 07/23/2008   Td (Adult),unspecified 02/20/2006, 05/04/2015   Zoster Recombinant(Shingrix) 02/05/2019, 03/16/2022   Zoster, Live 04/02/2005    TDAP status: Up to date  Flu Vaccine status: Due, Education has been provided regarding the importance of this vaccine. Advised may receive this  vaccine at local pharmacy or Health Dept. Aware to provide a copy of the vaccination record if obtained from local pharmacy or Health Dept. Verbalized acceptance and understanding.  Pneumococcal vaccine status: Up to date  Covid-19 vaccine status: Completed vaccines  Qualifies for Shingles Vaccine? Yes   Zostavax completed Yes   Shingrix Completed?: Yes  Screening Tests Health Maintenance  Topic Date Due   Colonoscopy  11/06/2021   COVID-19 Vaccine (6 - 2023-24 season) 03/23/2022   INFLUENZA VACCINE  02/21/2023   Medicare Annual Wellness (AWV)  02/22/2024   DTaP/Tdap/Td (3 - Tdap) 05/03/2025   Pneumonia Vaccine 3+ Years old  Completed   Hepatitis C Screening  Completed   Zoster Vaccines- Shingrix  Completed   HPV VACCINES  Aged Out    Health Maintenance  Health Maintenance Due  Topic Date Due   Colonoscopy  11/06/2021   COVID-19 Vaccine (6 - 2023-24 season) 03/23/2022   INFLUENZA VACCINE  02/21/2023    Colorectal cancer screening: Referral to GI placed 02/22/23. Pt aware the office will call re: appt.  Lung Cancer Screening: (Low Dose CT Chest recommended if Age 86-80 years, 20 pack-year currently smoking OR have quit w/in 15years.) does not qualify.     Additional Screening:  Hepatitis C Screening: does qualify; Completed 02/24/19  Vision Screening: Recommended annual ophthalmology exams for early detection of glaucoma and other disorders of the eye. Is the patient up to date with their annual eye exam?  Yes  Who is the provider or what is the name of the office in which the patient attends annual eye exams? [facility] If pt is not established with a provider, would they like to be referred to a provider to establish care? No .   Dental Screening: Recommended annual dental exams for proper oral hygiene    Community Resource Referral / Chronic Care Management:  CRR required this visit?  No   CCM required this visit?  No     Plan:     I have personally  reviewed and noted the following in the patient's chart:   Medical and social history Use of alcohol, tobacco or illicit drugs  Current medications and supplements including opioid prescriptions. Patient is not currently taking opioid prescriptions. Functional ability and status Nutritional status Physical activity Advanced directives List of other physicians Hospitalizations, surgeries, and ER visits in previous 12 months Vitals Screenings to include cognitive, depression, and falls Referrals and appointments  In addition, I have reviewed and discussed with patient certain preventive protocols, quality metrics, and best practice recommendations. A written personalized care plan for preventive services as well as general preventive health recommendations were provided to patient.     Tillie Rung, LPN   07/28/1094   After Visit Summary: Given  Nurse Notes: None

## 2023-02-22 NOTE — Progress Notes (Signed)
Established Patient Office Visit  Subjective   Patient ID: [name] [name], male    DOB: Dec 02, 1950  Age: 72 y.o. MRN: [id number]  Chief Complaint  Patient presents with   Gout    Patient complains of gout in left hand, x1 month    HPI   Mr. Maynes has history of atrial fibs/flutter, CAD, hypertension, obstructive sleep apnea, psoriatic arthritis, hyperlipidemia.  Bypass many years ago.  Seen today with possible gout flareup left hand involving second and third MCP joints.  First noted flareup about a month ago.  He was up in [place] and went to urgent care and was given Medrol Dosepak and symptoms did improve though not fully resolved.  He states this pain is very similar to previous flareups he had.  Most recent uric acid 7.1.  Has not taken thing for prophylaxis.  He is well aware of dietary triggers.  Denies any other explosive or new arthralgias.  No recent hand injury.  He noticed some visible swelling as well as warmth and mild erythema of the left second and third MCP joints.  Past Medical History:  Diagnosis Date   Chicken pox    Coronary artery disease    Severe three-vessel coronary artery disease with ejection fraction of 65%   GERD (gastroesophageal reflux disease)    History of gout    History of psoriasis    Hypercholesterolemia    Hypertension    OSA (obstructive sleep apnea)    Moderate with AHI of 16.9/hr now on CPAP at 10cm H2O   Psoriatic arthritis (HCC)    Past Surgical History:  Procedure Laterality Date   ATRIAL FLUTTER ABLATION  03/30/2014   ATRIAL FLUTTER ABLATION N/A 03/30/2014   Procedure: ATRIAL FLUTTER ABLATION;  Surgeon: Gardiner Rhyme, MD;  Location: [facility] CATH LAB;  Service: Cardiovascular;  Laterality: N/A;   CARDIAC CATHETERIZATION  09/2003   Ejection fraction was estimated at 65%.   CARDIOVERSION N/A 03/15/2014   Procedure: CARDIOVERSION;  Surgeon: Lewayne Bunting, MD;  Location: [facility] ENDOSCOPY;  Service: Cardiovascular;  Laterality: N/A;    CARDIOVERSION N/A 07/17/2017   Procedure: CARDIOVERSION;  Surgeon: Quintella Reichert, MD;  Location: [facility] ENDOSCOPY;  Service: Cardiovascular;  Laterality: N/A;   CORONARY ARTERY BYPASS GRAFT  09/2003   [place]-lad,svg-diag,svg-om/distal LCX,svg-pda   TEE WITHOUT CARDIOVERSION N/A 03/15/2014   Procedure: TRANSESOPHAGEAL ECHOCARDIOGRAM (TEE);  Surgeon: Lewayne Bunting, MD;  Location: [facility] ENDOSCOPY;  Service: Cardiovascular;  Laterality: N/A;   TEE WITHOUT CARDIOVERSION N/A 07/17/2017   Procedure: TRANSESOPHAGEAL ECHOCARDIOGRAM (TEE);  Surgeon: Quintella Reichert, MD;  Location: [facility] ENDOSCOPY;  Service: Cardiovascular;  Laterality: N/A;   TONSILLECTOMY  1950's    reports that he has quit smoking. His smoking use included cigarettes. He has a 2.5 pack-year smoking history. He has never used smokeless tobacco. He reports current alcohol use of about 4.0 standard drinks of alcohol per week. He reports that he does not use drugs. family history includes Cancer in his mother; Heart disease in his mother; Stroke in his mother. No Known Allergies  Review of Systems  Constitutional:  Negative for chills and fever.  Neurological:  Negative for weakness.      Objective:     BP 120/60 (BP Location: Left Arm, Patient Position: Sitting, Cuff Size: Normal)   Pulse 61   Temp 98.1 F (36.7 C) (Oral)   Ht 6\' 1"  (1.854 m)   Wt 197 lb 9.6 oz (89.6 kg)   SpO2 96%  BMI 26.07 kg/m  BP Readings from Last 3 Encounters:  02/22/23 120/60  02/22/23 120/60  12/27/22 112/64      Physical Exam Vitals reviewed.  Constitutional:      Appearance: Normal appearance.  Cardiovascular:     Comments: Irregular rhythm consistent with his atrial fibrillation Musculoskeletal:     Comments: Left hand reveals some obvious mild swelling of the second and third MCP joint.  Very mild erythema.  No significant warmth.  Neurological:     Mental Status: He is alert.      No results found for any visits on 02/22/23.    The  10-year ASCVD risk score (Arnett DK, et al., 2019) is: 20.1%    Assessment & Plan:   Acute arthralgias left second and third MCP joints with history of gout.  Suspect acute gout.  Recommend prednisone 20 mg 2 tablets daily for 6 days and get back on daily colchicine 0.6 mg once daily.  He has been unable to tolerate higher doses of colchicine.  If not more fully resolving with 1 more dose of prednisone recommend further evaluation with hand films and additional labs to rule out other potential causes of acute inflammatory arthritis.  Also continue low purine eating plan  Evelena Peat, MD

## 2023-03-01 DIAGNOSIS — G4733 Obstructive sleep apnea (adult) (pediatric): Secondary | ICD-10-CM | POA: Diagnosis not present

## 2023-03-05 NOTE — Progress Notes (Signed)
Assessment/Plan:   1.  Parkinsonism, very mild  -Patient sought second opinion at [facility].  Because of the mild nature of symptoms, they were not convinced patient had Parkinson's disease.  Both here and at [facility], patient had declined DaTscan and skin biopsies.  We discussed those again today.  He would like to hold on these things, as we really would not do anything differently.  I don't disagree with the decision.    -discussed the study he brought and I think that has closed enrolling.    -he is following with dermatology regularly because he is in a psoriasis study.     2.  Mild dysphagia  -he reports that this is normal right now  -MBE done December, 2023 was normal.  Regular diet with thin liquid recommended.  3.  F/u 6-8 months  Subjective:   [name] [name] was seen today in follow up for parkinsonism.  We have been following the patient closely.  He is not on medication currently.  Modified barium swallow was done since our last visit.  This was done in June 22, 2022.  This was normal.  He asks about potentially entering a study for Parkinsons Disease.  He is in a clinical trial for psoriasis and it has cleared his psoriasis.  It is a year long trial.  He has not had falls.  He has some L hand tremor but not significantly increased, unless he is on prednisone for his gout.  He is exercising regularly.  He has started rsb.  He has been dealing with a-fib and has cardioversion planned for tomorrow.      Current prescribed movement disorder medications: None   ALLERGIES:  No Known Allergies  CURRENT MEDICATIONS:  Current Meds  Medication Sig   acetaminophen (TYLENOL) 325 MG tablet Take 2 tablets (650 mg total) by mouth every 6 (six) hours as needed for up to 30 doses for mild pain or moderate pain.   atorvastatin (LIPITOR) 80 MG tablet Take 1 tablet (80 mg total) by mouth daily.   cholecalciferol (VITAMIN D) 1000 UNITS tablet Take 1,000 Units by mouth daily.   Coenzyme Q10  (CO Q 10 PO) Take 100 mg by mouth daily.    ELIQUIS 5 MG TABS tablet TAKE 1 TABLET BY MOUTH TWICE A DAY   furosemide (LASIX) 20 MG tablet TAKE 1 TABLET BY MOUTH TWICE A DAY   losartan (COZAAR) 50 MG tablet TAKE 1 TABLET BY MOUTH EVERY DAY   metoprolol succinate (TOPROL-XL) 50 MG 24 hr tablet Take 1 tablet by mouth daily.   multivitamin (THERAGRAN) per tablet Take 1 tablet by mouth daily.   nitroGLYCERIN (NITROSTAT) 0.4 MG SL tablet Place 1 tablet (0.4 mg total) under the tongue every 5 (five) minutes as needed for chest pain.   potassium chloride SA (K-DUR,KLOR-CON) 20 MEQ tablet Take 1 tablet (20 mEq total) by mouth daily.     Objective:   PHYSICAL EXAMINATION:    VITALS:   Vitals:   03/14/23 1039  BP: 118/82  Pulse: 73  SpO2: 98%  Weight: 204 lb 9.6 oz (92.8 kg)    GEN:  The patient appears stated age and is in NAD. HEENT:  Normocephalic, atraumatic.  The mucous membranes are moist.  CV:  Irreg irreg Lungs:  CTAB  Neurological examination:  Orientation: The patient is alert and oriented x3. Cranial nerves: There is good facial symmetry without facial hypomimia. The speech is fluent and clear. Soft palate rises symmetrically and  there is no tongue deviation. Hearing is intact to conversational tone. Sensation: Sensation is intact to light touch throughout Motor: Strength is at least antigravity x4.  Movement examination: Tone: There is slight increased tone in the LUE Abnormal movements: there is left upper extremity rest tremor with distraction only Coordination:  There is decremation only with toe taps on the L Gait and Station: The patient has no difficulty arising out of a deep-seated chair without the use of the hands. The patient's stride length is good with LUE rest tremor (same as previous).    I have reviewed and interpreted the following labs independently    Chemistry      Component Value Date/Time   NA 139 03/06/2022 0909   NA 141 09/13/2021 0000   K 4.6  03/06/2022 0909   CL 102 03/06/2022 0909   CO2 29 03/06/2022 0909   BUN 15 03/06/2022 0909   BUN 8 09/13/2021 0000   CREATININE 0.85 03/06/2022 0909   CREATININE 0.79 02/26/2020 0916   GLU 102 09/13/2021 0000      Component Value Date/Time   CALCIUM 9.7 03/06/2022 0909   ALKPHOS 74 03/06/2022 0909   AST 20 03/06/2022 0909   ALT 16 03/06/2022 0909   BILITOT 1.1 03/06/2022 0909   BILITOT 1.2 11/30/2019 0827       Lab Results  Component Value Date   WBC 5.1 03/06/2022   HGB 15.0 03/06/2022   HCT 44.3 03/06/2022   MCV 91.7 03/06/2022   PLT 157.0 03/06/2022    Lab Results  Component Value Date   TSH 0.85 03/06/2022      Cc:  Kristian Covey, MD

## 2023-03-06 ENCOUNTER — Ambulatory Visit: Payer: Medicare HMO | Admitting: Neurology

## 2023-03-13 DIAGNOSIS — I483 Typical atrial flutter: Secondary | ICD-10-CM | POA: Diagnosis not present

## 2023-03-13 DIAGNOSIS — I4819 Other persistent atrial fibrillation: Secondary | ICD-10-CM | POA: Diagnosis not present

## 2023-03-13 DIAGNOSIS — Z7901 Long term (current) use of anticoagulants: Secondary | ICD-10-CM | POA: Diagnosis not present

## 2023-03-14 ENCOUNTER — Ambulatory Visit: Payer: Medicare HMO | Admitting: Neurology

## 2023-03-14 ENCOUNTER — Encounter: Payer: Self-pay | Admitting: Neurology

## 2023-03-14 VITALS — BP 118/82 | HR 73 | Wt 204.6 lb

## 2023-03-14 DIAGNOSIS — G20A1 Parkinson's disease without dyskinesia, without mention of fluctuations: Secondary | ICD-10-CM | POA: Diagnosis not present

## 2023-03-14 NOTE — Patient Instructions (Signed)
 SAVE THE DATE!  We are planning a Parkinsons Disease educational symposium at [facility] in [place] on October 11.  More details to come!  If you would like to be added to our email list to get further information, email [email or web address].  We hope to see you there!

## 2023-03-15 DIAGNOSIS — Z7901 Long term (current) use of anticoagulants: Secondary | ICD-10-CM | POA: Diagnosis not present

## 2023-03-15 DIAGNOSIS — I483 Typical atrial flutter: Secondary | ICD-10-CM | POA: Diagnosis not present

## 2023-03-15 DIAGNOSIS — Z01810 Encounter for preprocedural cardiovascular examination: Secondary | ICD-10-CM | POA: Diagnosis not present

## 2023-03-15 DIAGNOSIS — I4819 Other persistent atrial fibrillation: Secondary | ICD-10-CM | POA: Diagnosis not present

## 2023-03-15 DIAGNOSIS — Z9889 Other specified postprocedural states: Secondary | ICD-10-CM | POA: Diagnosis not present

## 2023-03-15 DIAGNOSIS — Z8679 Personal history of other diseases of the circulatory system: Secondary | ICD-10-CM | POA: Diagnosis not present

## 2023-03-18 DIAGNOSIS — R69 Illness, unspecified: Secondary | ICD-10-CM | POA: Diagnosis not present

## 2023-04-01 DIAGNOSIS — Z7901 Long term (current) use of anticoagulants: Secondary | ICD-10-CM | POA: Diagnosis not present

## 2023-04-01 DIAGNOSIS — Z5181 Encounter for therapeutic drug level monitoring: Secondary | ICD-10-CM | POA: Diagnosis not present

## 2023-04-01 DIAGNOSIS — R001 Bradycardia, unspecified: Secondary | ICD-10-CM | POA: Diagnosis not present

## 2023-04-01 DIAGNOSIS — Z79899 Other long term (current) drug therapy: Secondary | ICD-10-CM | POA: Diagnosis not present

## 2023-04-01 DIAGNOSIS — I483 Typical atrial flutter: Secondary | ICD-10-CM | POA: Diagnosis not present

## 2023-04-01 DIAGNOSIS — Z87891 Personal history of nicotine dependence: Secondary | ICD-10-CM | POA: Diagnosis not present

## 2023-04-01 DIAGNOSIS — G4733 Obstructive sleep apnea (adult) (pediatric): Secondary | ICD-10-CM | POA: Diagnosis not present

## 2023-04-01 DIAGNOSIS — I4819 Other persistent atrial fibrillation: Secondary | ICD-10-CM | POA: Diagnosis not present

## 2023-04-01 DIAGNOSIS — Z01810 Encounter for preprocedural cardiovascular examination: Secondary | ICD-10-CM | POA: Diagnosis not present

## 2023-04-16 DIAGNOSIS — I43 Cardiomyopathy in diseases classified elsewhere: Secondary | ICD-10-CM | POA: Diagnosis not present

## 2023-04-16 DIAGNOSIS — R000 Tachycardia, unspecified: Secondary | ICD-10-CM | POA: Diagnosis not present

## 2023-04-16 DIAGNOSIS — I4819 Other persistent atrial fibrillation: Secondary | ICD-10-CM | POA: Diagnosis not present

## 2023-04-21 ENCOUNTER — Other Ambulatory Visit: Payer: Self-pay | Admitting: Cardiology

## 2023-04-22 NOTE — Telephone Encounter (Signed)
Prescription refill request for Eliquis received. Indication:afib Last office visit:9/24 Scr:1.0  9/24 Age: 72 Weight:92.8  kg  Prescription refilled

## 2023-05-01 DIAGNOSIS — G4733 Obstructive sleep apnea (adult) (pediatric): Secondary | ICD-10-CM | POA: Diagnosis not present

## 2023-05-17 ENCOUNTER — Other Ambulatory Visit: Payer: Self-pay | Admitting: Cardiology

## 2023-05-28 DIAGNOSIS — G4733 Obstructive sleep apnea (adult) (pediatric): Secondary | ICD-10-CM | POA: Diagnosis not present

## 2023-05-29 NOTE — Progress Notes (Signed)
Cardiology Office Note   Date:  06/04/2023   ID:  Noal, Kratzer Jul 04, 1951, MRN [id number]  PCP:  Kristian Covey, MD  Cardiologist:   Marielouise Amey [place], MD   Chief Complaint  Patient presents with   Atrial Fibrillation       History of Present Illness: [name] [name] is a 72 y.o. male who is seen for follow up Afib, CAD, AI, and CHF. He has a history of  atrial flutter and CAD. He is status post CABG in 2005. He had a normal stress echo in May of 2013. In August 2015 he presented with atrial flutter with RVR. He had mildly elevated troponins. He had successful TEE guided DCCV. He then underwent ablation of atrial flutter and ectopic atrial tachycardia on 03/30/14 by Dr. Johney Frame.   Stress Myoview September 2015 demonstrated excellent exercise tolerance and normal perfusion. TEE showed normal LV function, mild biatrial enlargement, mild MR,TR,AI. He does have moderate obstructive sleep apnea followed by Dr. Mayford Knife and is on CPAP therapy.  He was seen in December 2018 with  an irregular and fast pulse. Was found to be in AFib with RVR. Echo showed EF 40-45% with moderate MR and AI and moderate biatrial enlargement. Toprol dose was increased and he was started on Eliquis. Underwent TEE guided DCCV on 07/17/17. Echo showed EF 40% with moderate biatrial enlargement. Moderate to severe MR, moderate TR, patent foramen ovale. He was successfully cardioverted but had recurrence of Afib on 07/24/17. He was seen in Afib clinic with Dr. Johney Frame for consideration of Afib ablation versus AAD therapy. There was discussion about starting him on Tikosyn. Given new LV dysfunction and valvular disease it was felt that right and left heart cath were indicated prior to initiation of AAD therapy.  He also wore a monitor that showed episodes of wide complex tachycardia and it was unclear whether this was aberrancy versus VT.  He got a  second opinion at [facility] with Dr. Alden Hipp. He was admitted in February 2019 and  had a nuclear stress test showing no ischemia. EF 44%. He was loaded with Tikosyn and DCCV was performed. he had significant QT prolongation and Tikosyn was discontinued. He later underwent Cardiac MRI. He underwent AFib ablation with pulmonary vein isolation on 10/15/17. Echo following ablation showed EF 50-55%.  Both MRI and Echo showed evidence of moderate to severe AI. LV function had returned to normal with restoration of NSR.  In June 2020 he had recurrent Afib and underwent DCCV at [facility]. On follow up he was noted to be bradycardic and metoprolol dose was reduced. In September 2020 he had an Echo at [facility] showing normal LV function and moderate AI. He is followed regularly by Dr Regino Schultze at [facility]. Seen there in August. Echo done showing moderate AI. Normal LV function.   He presented to the ER on November 16, 2020 with fast heart beat: SVT vs fib/flutter.  Adenosine given with short break to 10 beats of VT, then back to SVT with RBBB. DCCV was about to be performed, but he converted on his own when they sat the patient up. He was discharged without admission. He sent monitor strips from 4/28 with Afib in the 170s. BB was increased. Strips were faxed to his [facility] cardiologist. Had first occurrence of Afib RVR on March 30 - HR 190s. He converted to NSR. Larey Seat and broke ribs on April 27th. Three dogs jumped on him and he fell and broke 4 ribs on  right side (3-6). No head injury, no LOC. Monitor showed Afib 4/28-5/7 - could be related to his rib injury. On subsequent follow up in the office had no recurrence of Afib.    He underwent redo AFib and Atrial tachycardia ablation by Dr Alden Hipp at [facility] on 09/12/21. Prior to this he had cardiac MRI showing mod-severe AI, mild LV enlargement - really unchanged from 2019.  Echo in Dec 2022 showed normal LV function and moderate AI.   He was seen by [facility] EP in April  and doing well in NSR. His metoprolol dose was reduced to 50 mg daily.  Has seen Neurology (Dr Tat) for  evaluation of mild Parkinson's symptoms.   In August he had recurrent AFib and underwent DCCV at [name]. He had early recurrence and was admitted in September for  repeat DCCV. QT felt to be too long for Dofetilide. Consideration for amiodarone vs ranolazine for recurrence.  He notes when in AFib his HR is persistently high 160 range and he feels tired. No chest pain or SOB. No edema.    Past Medical History:  Diagnosis Date   Chicken pox    Coronary artery disease    Severe three-vessel coronary artery disease with ejection fraction of 65%   GERD (gastroesophageal reflux disease)    History of gout    History of psoriasis    Hypercholesterolemia    Hypertension    OSA (obstructive sleep apnea)    Moderate with AHI of 16.9/hr now on CPAP at 10cm H2O   Psoriatic arthritis (HCC)     Past Surgical History:  Procedure Laterality Date   ATRIAL FLUTTER ABLATION  03/30/2014   ATRIAL FLUTTER ABLATION N/A 03/30/2014   Procedure: ATRIAL FLUTTER ABLATION;  Surgeon: Gardiner Rhyme, MD;  Location: [facility] CATH LAB;  Service: Cardiovascular;  Laterality: N/A;   CARDIAC CATHETERIZATION  09/2003   Ejection fraction was estimated at 65%.   CARDIOVERSION N/A 03/15/2014   Procedure: CARDIOVERSION;  Surgeon: Lewayne Bunting, MD;  Location: [facility] ENDOSCOPY;  Service: Cardiovascular;  Laterality: N/A;   CARDIOVERSION N/A 07/17/2017   Procedure: CARDIOVERSION;  Surgeon: Quintella Reichert, MD;  Location: [facility] ENDOSCOPY;  Service: Cardiovascular;  Laterality: N/A;   CORONARY ARTERY BYPASS GRAFT  09/2003   Lima-lad,svg-diag,svg-om/distal LCX,svg-pda   TEE WITHOUT CARDIOVERSION N/A 03/15/2014   Procedure: TRANSESOPHAGEAL ECHOCARDIOGRAM (TEE);  Surgeon: Lewayne Bunting, MD;  Location: [facility] ENDOSCOPY;  Service: Cardiovascular;  Laterality: N/A;   TEE WITHOUT CARDIOVERSION N/A 07/17/2017   Procedure: TRANSESOPHAGEAL ECHOCARDIOGRAM (TEE);  Surgeon: Quintella Reichert, MD;  Location: [facility] ENDOSCOPY;  Service: Cardiovascular;  Laterality:  N/A;   TONSILLECTOMY  1950's     Current Outpatient Medications  Medication Sig Dispense Refill   acetaminophen (TYLENOL) 325 MG tablet Take 2 tablets (650 mg total) by mouth every 6 (six) hours as needed for up to 30 doses for mild pain or moderate pain. 30 tablet 0   atorvastatin (LIPITOR) 80 MG tablet Take 1 tablet (80 mg total) by mouth daily. 90 tablet 3   cholecalciferol (VITAMIN D) 1000 UNITS tablet Take 1,000 Units by mouth daily.     Coenzyme Q10 (CO Q 10 PO) Take 100 mg by mouth daily.      ELIQUIS 5 MG TABS tablet TAKE 1 TABLET BY MOUTH TWICE A DAY 60 tablet 5   furosemide (LASIX) 20 MG tablet TAKE 1 TABLET BY MOUTH TWICE A DAY 180 tablet 3   losartan (COZAAR) 50 MG tablet TAKE 1 TABLET  BY MOUTH EVERY DAY 90 tablet 2   metoprolol succinate (TOPROL-XL) 50 MG 24 hr tablet Take 1 tablet by mouth daily.     multivitamin (THERAGRAN) per tablet Take 1 tablet by mouth daily.     nitroGLYCERIN (NITROSTAT) 0.4 MG SL tablet PLACE 1 TABLET UNDER THE TONGUE EVERY 5 MINUTES AS NEEDED FOR CHEST PAIN. 25 tablet 6   potassium chloride SA (K-DUR,KLOR-CON) 20 MEQ tablet Take 1 tablet (20 mEq total) by mouth daily. 30 tablet 6   No current facility-administered medications for this visit.    Allergies:   Patient has no known allergies.    Social History:  The patient  reports that he has quit smoking. His smoking use included cigarettes. He has a 2.5 pack-year smoking history. He has never used smokeless tobacco. He reports current alcohol use of about 4.0 standard drinks of alcohol per week. He reports that he does not use drugs.   Family History:  The patient's family history includes Cancer in his mother; Heart disease in his mother; Stroke in his mother.    ROS:  Please see the history of present illness.   Otherwise, review of systems are positive for none.   All other systems are reviewed and negative.    PHYSICAL EXAM: VS:  BP 118/66 (BP Location: Left Arm, Patient Position: Sitting,  Cuff Size: Normal)   Pulse 64   Ht 6\' 1"  (1.854 m)   Wt 199 lb 9.6 oz (90.5 kg)   SpO2 98%   BMI 26.33 kg/m  , BMI Body mass index is 26.33 kg/m.  GENERAL:  Well appearing WM in NAD HEENT:  PERRL, EOMI, sclera are clear. Oropharynx is clear. NECK:  No jugular venous distention, carotid upstroke brisk and symmetric, soft left subclavian bruit, no thyromegaly or adenopathy LUNGS:  Clear to auscultation bilaterally CHEST:  Unremarkable HEART:  RRR,   PMI not displaced or sustained,S1 and S2 within normal limits, no S3, no S4: no clicks, no rubs, very soft 1/6 diastolic murmur. ABD:  Soft, nontender. BS +, no masses or bruits. No hepatomegaly, no splenomegaly EXT:  2 + pulses throughout, tr edema, chronic venous stasis changes. SKIN:  Warm and dry.  No rashes NEURO:  Alert and oriented x 3. Cranial nerves II through XII intact. PSYCH:  Cognitively intact   Recent Labs: No results found for requested labs within last 365 days.  Dated 05/08/17: normal CMET and CBC Dated 09/13/21: normal CBC, CMET, Mg Dated 03/13/23: normal TSH Dated 04/01/23: normal CBC and CMET. Mag 2.2.  Lipid Panel    Component Value Date/Time   CHOL 131 03/06/2022 0909   CHOL 163 11/30/2019 0827   TRIG 61.0 03/06/2022 0909   HDL 57.20 03/06/2022 0909   HDL 74 11/30/2019 0827   CHOLHDL 2 03/06/2022 0909   VLDL 12.2 03/06/2022 0909   LDLCALC 62 03/06/2022 0909   LDLCALC 75 11/30/2019 0827     EKG Interpretation Date/Time:  Tuesday June 04 2023 08:58:02 EST Ventricular Rate:  64 PR Interval:  194 QRS Duration:  110 QT Interval:  452 QTC Calculation: 466 R Axis:   10  Text Interpretation: Sinus rhythm with marked sinus arrhythmia Cannot rule out Anterior infarct , age undetermined When compared with ECG of 19-Oct-2020 16:36, No significant change since last tracing Confirmed by [place], Wyatt Thorstenson [phone]) on 06/04/2023 9:00:58 AM    Wt Readings from Last 3 Encounters:  06/04/23 199 lb 9.6 oz (90.5 kg)   03/14/23 204 lb 9.6 oz (  92.8 kg)  02/22/23 197 lb 9.6 oz (89.6 kg)     Other studies Reviewed: Additional studies/ records that were reviewed today include: none.  ETT 03/06/17: Study Highlights     Blood pressure demonstrated a hypertensive response to exercise. Upsloping ST segment depression ST segment depression of 1 mm was noted during stress in the II, III, aVF, V4 and V5 leads, and returning to baseline after less than 1 minute of recovery.   1. Good exercise tolerance.  2. Upsloping ST depression in inferior leads and V4/V5.  This is nonspecific.    No evidence for ischemia.    Echo 07/08/17: Study Conclusions   - Left ventricle: The cavity size was normal. Wall thickness was   increased in a pattern of moderate LVH. Systolic function was   mildly to moderately reduced. The estimated ejection fraction was   in the range of 40% to 45%. - Aortic valve: There was moderate regurgitation. - Mitral valve: There was moderate regurgitation. - Left atrium: The atrium was moderately dilated. - Right atrium: The atrium was moderately dilated.  TEE 07/17/17: Study Conclusions   - Left ventricle: The estimated ejection fraction was in the range   of 40% to 45%. Mild diffuse hypokinesis with no identifiable   regional variations. No evidence of thrombus. - Aortic valve: There was moderate perivalvular regurgitation. - Mitral valve: No evidence of vegetation. There was moderate to   severe regurgitation, with multiple jets. Diastolic regurgitation   was absent. - Left atrium: The atrium was moderately dilated. No evidence of   thrombus in the atrial cavity or appendage. No evidence of   thrombus in the atrial cavity or appendage. Emptying velocity was   reduced. - Right atrium: The atrium was moderately dilated. - Atrial septum: There was a medium-sized fenestrated patent   foramen ovale. A in the fossa ovalis region was present. There   was a moderate bidirectional shunt  through a patent foramen   ovale, in the baseline state. - Tricuspid valve: No evidence of vegetation. There was moderate   regurgitation. - Pulmonic valve: No evidence of vegetation.   Impressions:   - Successful cardioversion. No cardiac source of emboli was   indentified.  Event monitor 07/25/17: Study Highlights   Atrial fibrillation Ventricular rates are frequently elevated Frequent nonsustained ventricular tachycardia as well as aberrantly conducted afib are note No sustained ventricular arrhythmias No prolonged pauses or AV block   Cardiac MRI 10/14/17: Cardiac MRI and thoracic MRA with and without contrast was performed on a 1.5 T MRI scanner to evaluate myocardial morphology, function, viability and assess pulmonary vein anatomy in a patient with hx of GERD, HTN, hypercholesterolemia, OSA on CPAP, CAD s/p CABG (LIMA to LAD, svg to D1, sequential svg to OM and distal LCx, and svg to PDA), atrial flutter s/p ablation, atrial fibrillation, and wide complex tachycardia thought to due to SVT with aberrancy.  Transthoracic echo demonstrated an LVEF of 40-45%, normal cavity size, moderate MR, and moderate AR.  Transesophageal echo demonstrated moderate aortic regurgitation, moderate-severe mitral regurgitation, and a PFO.  Nuclear stress demonstrated a mildly dilated LV with no perfusion defect and an EF of 44%.  The patient is scheduled for ablation for atrial fibrillation.  Cardiac MRI 1. The left ventricle is moderately dilated.  Wall thickness is normal.  Regional and global LV systolic function are low normal.  The LVEF is calculated at 59%.  2. The right ventricle is normal in cavity size and wall thickness.  Global RV systolic function is low normal to mildly reduced.  The RVEF is calculated at 50%.  3. Both atria are severely enlarged.  4. The aortic valve is trileaflet in morphology. There is no significant aortic stenosis. There is moderate-severe aortic regurgitation.   The regurgitant orifice area measures 0.26 cm^2.  There is reversal of flow in the proximal descending thoracic aorta.  5.  There is  mild-moderate mitral regurgitation.  There is moderate tricuspid regurgitation.  There is mild pulmonic regurgitation.  6. Delayed enhancement imaging demonstrates no evidence of myocardial infarction, scarring,  or infiltration.  7.  There is no evidence of an intracardiac thrombus.  8.  The pericardium is normal in thickness.  There is no significant pericardial effusion.  Thoracic MRA 1. There are 3 right sided pulmonary veins.  The right lower and right middle pulmonary veins enter the LA immediately adjacent to one another. The right middle vein is small.  The 2 left sided pulmonary veins enter the LA normally.  All veins are patent without significant stenosis proximally.   MRA bi-orthogonal luminal dimensions are listed below: RUPV: 2.0 x 1.8 cm, 76 cm/sec RMPV: 0.6 x 0.5 cm RLPV: 2.0 x 1.7 cm, 60 cm/ sec LUPV: 1.9 x 1.0 cm, 59 cm/ sec LLPV: 1.9 x 0.9 cm, 60 cm/ sec  MRA LA dimensions Head-foot: 7.1 cm Right-left:  6.2 cm Anterior-posterior:  4.3 cm  2. The thoracic aorta is normal in diameter. There is no evidence of a dissection flap.  3. The main and proximal branch pulmonary arteries are normal in size  4. Systemic venous connections are normal.  5. The esophagus is posterior of the left atrium in close proximity to the ostia of the left-sided pulmonary veins.  The descending thoracic aorta is behind the esophagus and behind the left sided pulmonary Veins.  Echo 10/16/17: INTERPRETATION ---------------------------------------------------------------   NORMAL LEFT VENTRICULAR SYSTOLIC FUNCTION WITH MILD LVH   NORMAL LA PRESSURES WITH DIASTOLIC DYSFUNCTION   NORMAL RIGHT VENTRICULAR SYSTOLIC FUNCTION   VALVULAR REGURGITATION: MODERATE AR, MILD MR, TRIVIAL PR, MILD TR   NO VALVULAR STENOSIS   IRREGULAR RHYTHM   Moderate to  severe aortic regurgitation. Mild left ventricular enlargement   suggests color Doppler may underestimate aortic regurgitation severity.   AR VENA CONTRACTA=0.5CM   MILD TO MODERATE TR   NO PRIOR STUDY FOR COMPARISON  Echo 05/02/18: Study Conclusions   - Left ventricle: The cavity size was mildly dilated. Wall   thickness was increased in a pattern of mild LVH. Systolic   function was normal. The estimated ejection fraction was in the   range of 50% to 55%. Wall motion was normal; there were no   regional wall motion abnormalities. Left ventricular diastolic   function parameters were normal. - Aortic valve: There was mild to moderate regurgitation. - Mitral valve: Calcified annulus. There was mild regurgitation. - Left atrium: The atrium was severely dilated. - Tricuspid valve: There was moderate regurgitation. - Pulmonary arteries: Systolic pressure was mildly increased. PA   peak pressure: 41 mm Hg (S).   Impressions:   - Normal LV systolic function; mild LVE and LVH; mild to moderate   AI; mild MR; severe LAE; moderate TR; mild pulmonary   hypertension.  Echo 03/26/19: INTERPRETATION ---------------------------------------------------------------   NORMAL LEFT VENTRICULAR SYSTOLIC FUNCTION   NORMAL LA PRESSURES WITH DIASTOLIC DYSFUNCTION   NORMAL RIGHT VENTRICULAR SYSTOLIC FUNCTION   VALVULAR REGURGITATION: MODERATE AR, MILD MR, TRIVIAL PR, MILD TR   NO  VALVULAR STENOSIS   AR VENA CONTRACTA=0.6CM   MILD TO MODERATE TR   NO PRIOR STUDY FOR COMPARISON   Echo 04/11/20: ECHOCARDIOGRAPHIC DESCRIPTIONS -----------------------------------------------  AORTIC ROOT          Size: MILDLY DILATED    Dissection: INDETERM FOR DISSECTION   AORTIC VALVE      Leaflets: Tricuspid             Morphology: MILDLY THICKENED      Mobility: Fully Mobile   LEFT VENTRICLE                                      Anterior: Normal          Size: MILDLY ENLARGED                        Lateral:  Normal   Contraction: Normal                                  Septal: Normal    Closest EF: >55%(Estimated)  Calc.EF: 56% (3D)      Apical: Normal     LV masses: No Masses                             Inferior: Normal           LVH: MILD LVH                             Posterior: Normal   LV GLS(LOL): -18.0%   LV GLS(AVG): -16.6% Normal Range [ <= -16]  Dias.FxClass: N/A   MITRAL VALVE      Leaflets: Normal                  Mobility: Fully mobile    Morphology: Normal   LEFT ATRIUM          Size: MODERATELY ENLARGED     LA masses: No masses                Normal IAS   MAIN PA          Size: DILATED   PULMONIC VALVE    Morphology: Normal      Mobility: Fully Mobile   RIGHT VENTRICLE          Size: Normal                    Free wall: Normal   Contraction: Normal                    RV masses: No Masses         TAPSE:   2.1 cm,  Normal Range [>= 1.6 cm]       RV Note: ANNULAR VELOCITY=10cm/s   TRICUSPID VALVE      Leaflets: Normal                  Mobility: Fully mobile    Morphology: Normal   RIGHT ATRIUM          Size: MODERATELY ENLARGED        RA Other: None     RA masses: No masses   PERICARDIUM  Fluid: No effusion   INFERIOR VENACAVA          Size: DILATED    Normal respiratory collapse   DOPPLER ECHO and OTHER SPECIAL PROCEDURES ------------------------------------     Aortic: MODERATE AR            No AS      Mitral: MILD MR                No MS     MV Inflow E Vel.= 70.0 cm/s  MV Annulus E'Vel.= 8.5 cm/s  E/E'Ratio= 8   Tricuspid: MILD TR                No TS             2.9 m/s peak TR vel   41 mmHg peak RV pressure   Pulmonary: TRIVIAL PR             No PS       Other:   INTERPRETATION ---------------------------------------------------------------    NORMAL LEFT VENTRICULAR SYSTOLIC FUNCTION WITH MILD LVH    NORMAL RIGHT VENTRICULAR SYSTOLIC FUNCTION    VALVULAR REGURGITATION: MODERATE AR, MILD MR, TRIVIAL PR, MILD TR    NO VALVULAR  STENOSIS    MILDLY DILATED LEFT VENTRICLE SUGGESTS POSSIBILITY THAT ECHOCARDIOGRAPHY    UNDERESTIMATES AORTIC REGURGITATION SEVERITY.    AR VENA CONTRACTA=0.6cm    3D acquisition and reconstructions were performed as part of this    examination to more accurately quantify the effects of moderate or greater    valve regurgitation. (post-processing on an Independent workstation).     Compared with prior Echo study on 03/26/2019: NO SIGNIFICANT CHANGES.    Event monitor 12/07/20: Study Highlights  Atrial fibrillation with RVR and intermittent aberrancy. Patient had returned to NSR on 12/01/20    Echo 07/04/21: INTERPRETATION ---------------------------------------------------------------    NORMAL LEFT VENTRICULAR SYSTOLIC FUNCTION WITH MILD LVH    NORMAL RIGHT VENTRICULAR SYSTOLIC FUNCTION    VALVULAR REGURGITATION: MODERATE AR, MILD MR, MILD PR, MILD TR    NO VALVULAR STENOSIS    IRREGULAR RHYTHM THROUGHOUT EXAM    AR VENA CONTRACTA = 0.5 cm    3D acquisition and reconstructions were performed as part of this    examination to more accurately quantify the effects of identified    structural abnormalities as part of the exam. (post-processing on an    Independent workstation).     Compared with prior Echo study on 04/11/2020: MILD TO MODERATE TR    SEVERE BIATRIAL ENLARGEMENT. INCREASED RV DIMENSIONS    NO SIGNIFICANT CHANGE IN AR OR LV FUNCTION    Cardiac MRI 09/11/21: SUMMARY   ==========================================================================================================   Cardiac MRI and thoracic MRA were performed  with and without contrast on a 3.0 T MRI scanner to evaluate  myocardial morphology, function, viability and pulmonary vein anatomy in a 72 y/o patient with hx of GERD,  HTN, hypercholesterolemia, OSA on CPAP, CAD s/p CABG (LIMA to LAD, svg to D1, sequential svg to OM and  distal LCx, and svg to PDA), atrial flutter s/p ablation, PFO, atrial fibrillation,  wide complex  tachycardia thought to due to SVT with aberrancy, aortic regurgitation, and mitral regurgitation who is  scheduled for afib ablation.  Comparisons were made to the prior scan from 10/14/2017.   The cardiac rhythm was atrial fibrillation. Real time imaging was used.  Regurgitant valvular lesions could  not be fully assessed due to the use of real time imaging.  Comprehensive  assessment could be performed  following conversion to NSR.   Cardiac MRI  1. The left ventricle is moderately dilated.  Wall thickness is normal.  There are no regional wall motion  abnormalities. During atrial fibrillation, there is low normal to mildly reduced function with an LVEF  visually estimated at 50-55%.   2. The right ventricle is normal in cavity size and wall thickness.  Global RV systolic function is low  normal to mildly reduced.   3. Both atria are severely enlarged.   4. The aortic valve is trileaflet in morphology. There is no significant aortic stenosis. There is at least  moderate-severe aortic regurgitation.   5.  There is at least mild-moderate mitral regurgitation.  There is at least moderate tricuspid  regurgitation. There is mild pulmonic regurgitation.   6. Delayed enhancement imaging demonstrates no evidence of myocardial infarction, scarring,  or  infiltration.   7.  There is no evidence of an intracardiac thrombus.   8.  The pericardium is normal in thickness.  There is no significant pericardial effusion.   Thoracic MRA:  1. There are 3 right sided pulmonary veins.  The right lower and right middle pulmonary veins enter the LA  immediately adjacent to one another. The right middle vein is small.   The 2 left sided pulmonary veins enter the LA normally.   All veins are patent without significant stenosis proximally.   MRA bi-orthogonal luminal dimensions are listed below:  RUPV: 2.1 x 1.9 cm (prior 2.0 x 1.8 cm, 76 cm/sec)  RMPV: 0.7 x 0.6 cm (prior 0.6 x 0.5 cm)   RLPV: 2.1 x 1.7 cm (prior 2.0 x 1.7 cm, 60 cm/ sec)  LUPV: 1.9 x 1.1 cm (prior 1.9 x 1.0 cm, 59 cm/ sec)  LLPV: 1.7 x 1.0 cm (prior 1.9 x 0.9 cm, 60 cm/ sec)   MRA LA dimensions  Head-foot: 6.9 cm  Right-left: 6.8 cm  Anterior-posterior: 4.5 cm   2. The thoracic aorta is normal in diameter. There is no evidence of a dissection flap.   3. The main and proximal branch pulmonary arteries are normal in size   4. Systemic venous connections are normal.   5. The esophagus traverses posterior to ostia of the left-sided pulmonary veins.  The descending thoracic  aorta is behind the esophagus and behind the left sided pulmonary veins.   Echo 07/26/22:   INTERPRETATION ---------------------------------------------------------------    NORMAL LEFT VENTRICULAR SYSTOLIC FUNCTION WITH MILD LVH    NORMAL RIGHT VENTRICULAR SYSTOLIC FUNCTION    VALVULAR REGURGITATION: MODERATE AR, MILD MR, TRIVIAL PR, MILD TR    NO VALVULAR STENOSIS     Compared with prior Echo study on 07/04/2021: MILD TO MODERATE TR. NO    SIGNIFICANT CHANGE IN AR OR LV SIZE/FUNCTION.   ASSESSMENT AND PLAN:  1. Atrial fibriillation with RVR. Prior history of atrial flutter and atrial tachycardia ablation in 2015. On anticoagulation with Eliquis 5 mg bid. [place] vasc score of 3. On  Toprol XL.   Now s/p repeat Afib ablation at [facility] on 10/15/17 and again in Feb 2023. Recurrent Afib in August. Underwent DCCV then repeat in September. Not felt to be a candidate for Dofetilide due to prolonged QT. Consider amiodarone for recurrence  2. Coronary disease status post CABG in 2005. He is without chest pain. He had a normal Myoview study September 2015. Low risk ETT in August 2018. Repeat nuclear  study at [facility] 2019 showed no ischemia. EF 44%.  3. Chronic Aortic insufficiency- moderate to severe.  Pulse pressure normal. No symptoms. Repeat Echo in Jan at [facility] showed no change. Moderate AI. Normal LV size and function. MRI stable from  2019.  4. 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   5. Atrial flutter s/p ablation.   6. OSA- now on CPAP. Followed by Dr. Mayford Knife.  7. Hyperlipidemia on statin. Needs updated lipid panel - will check today  8. HTN - BP is under good control  9. Left subclavian stenosis. Asymptomatic.     Disposition: FU with me in 6 months  Signed, Marlys Stegmaier [place], MD  06/04/2023 9:07 AM     [facility]

## 2023-06-01 DIAGNOSIS — G4733 Obstructive sleep apnea (adult) (pediatric): Secondary | ICD-10-CM | POA: Diagnosis not present

## 2023-06-04 ENCOUNTER — Encounter: Payer: Self-pay | Admitting: Cardiology

## 2023-06-04 ENCOUNTER — Ambulatory Visit: Payer: Medicare HMO | Attending: Cardiology | Admitting: Cardiology

## 2023-06-04 VITALS — BP 118/66 | HR 64 | Ht 73.0 in | Wt 199.6 lb

## 2023-06-04 DIAGNOSIS — I10 Essential (primary) hypertension: Secondary | ICD-10-CM

## 2023-06-04 DIAGNOSIS — I4892 Unspecified atrial flutter: Secondary | ICD-10-CM | POA: Diagnosis not present

## 2023-06-04 DIAGNOSIS — I351 Nonrheumatic aortic (valve) insufficiency: Secondary | ICD-10-CM

## 2023-06-04 DIAGNOSIS — I2510 Atherosclerotic heart disease of native coronary artery without angina pectoris: Secondary | ICD-10-CM

## 2023-06-04 DIAGNOSIS — I4891 Unspecified atrial fibrillation: Secondary | ICD-10-CM

## 2023-06-04 NOTE — Patient Instructions (Signed)
Medication Instructions:  Continue all medications *If you need a refill on your cardiac medications before your next appointment, please call your pharmacy*   Lab Work: None ordered   Testing/Procedures: None ordered   Follow-Up: At [facility], you and your health needs are our priority.  As part of our continuing mission to provide you with exceptional heart care, we have created designated Provider Care Teams.  These Care Teams include your primary Cardiologist (physician) and Advanced Practice Providers (APPs -  Physician Assistants and Nurse Practitioners) who all work together to provide you with the care you need, when you need it.  We recommend signing up for the patient portal called "MyChart".  Sign up information is provided on this After Visit Summary.  MyChart is used to connect with patients for Virtual Visits (Telemedicine).  Patients are able to view lab/test results, encounter notes, upcoming appointments, etc.  Non-urgent messages can be sent to your provider as well.   To learn more about what you can do with MyChart, go to [email or web address].    Your next appointment:  6  months    Call in Feb to schedule May appointment    Provider:  Dr.Jordan

## 2023-06-05 LAB — LIPID PANEL
Chol/HDL Ratio: 2.2 ratio (ref 0.0–5.0)
Cholesterol, Total: 141 mg/dL (ref 100–199)
HDL: 64 mg/dL (ref >39)
LDL Chol Calc (NIH): 65 mg/dL (ref 0–99)
Triglycerides: 54 mg/dL (ref 0–149)
VLDL Cholesterol Cal: 12 mg/dL (ref 5–40)

## 2023-06-27 DIAGNOSIS — G4733 Obstructive sleep apnea (adult) (pediatric): Secondary | ICD-10-CM | POA: Diagnosis not present

## 2023-07-01 DIAGNOSIS — G4733 Obstructive sleep apnea (adult) (pediatric): Secondary | ICD-10-CM | POA: Diagnosis not present

## 2023-07-18 ENCOUNTER — Ambulatory Visit (HOSPITAL_COMMUNITY)
Admission: EM | Admit: 2023-07-18 | Discharge: 2023-07-18 | Disposition: A | Discharge status: Home / Self Care | Payer: Medicare HMO | Attending: Nurse Practitioner | Admitting: Nurse Practitioner

## 2023-07-18 ENCOUNTER — Telehealth: Payer: Self-pay

## 2023-07-18 ENCOUNTER — Other Ambulatory Visit: Payer: Self-pay

## 2023-07-18 ENCOUNTER — Encounter (HOSPITAL_COMMUNITY): Payer: Self-pay | Admitting: *Deleted

## 2023-07-18 DIAGNOSIS — M79641 Pain in right hand: Secondary | ICD-10-CM | POA: Diagnosis not present

## 2023-07-18 DIAGNOSIS — Z8739 Personal history of other diseases of the musculoskeletal system and connective tissue: Secondary | ICD-10-CM

## 2023-07-18 MED ORDER — PREDNISONE 10 MG PO TABS: ORAL_TABLET | ORAL | 0 refills | Status: AC

## 2023-07-18 NOTE — Telephone Encounter (Signed)
Patient was advised to proceed to his nearest urgent care for evaluation and treatment as we do not have any available openings today at the clinic.

## 2023-07-18 NOTE — ED Triage Notes (Signed)
Pt reports a gout flare up in RT hand. Started 4 days ago. Rt hand is red and swollen.

## 2023-07-18 NOTE — Discharge Instructions (Signed)
Take the prednisone as prescribed for gout.  If symptoms do not improve with treatment or if they worsen with treatment, seek care.

## 2023-07-18 NOTE — ED Provider Notes (Signed)
[facility]    CSN: [id number] Arrival date & time: 07/18/23  1314      History   Chief Complaint Chief Complaint  Patient presents with   Gout    HPI [name] [name] is a 72 y.o. male.   Patient presents today with 4 day history of right hand pain.  Reports the area is swollen, raised, and very sensitive to touch.  Patient denies any injury, fall, or trauma to the hand.  Reports has history of gout and has had gout of the left hand but not the right.  No fevers or nausea/vomiting.  Has tried Tylenol and colchicine without improvement.    Past Medical History:  Diagnosis Date   Chicken pox    Coronary artery disease    Severe three-vessel coronary artery disease with ejection fraction of 65%   GERD (gastroesophageal reflux disease)    History of gout    History of psoriasis    Hypercholesterolemia    Hypertension    OSA (obstructive sleep apnea)    Moderate with AHI of 16.9/hr now on CPAP at 10cm H2O   Psoriatic arthritis [facility])     Patient Active Problem List   Diagnosis Date Noted   Psoriatic arthritis (HCC)    History of psoriasis    History of gout    GERD (gastroesophageal reflux disease)    Chicken pox    Persistent atrial fibrillation (HCC)    Erectile dysfunction 05/06/2017   Overweight (BMI 25.0-29.9) 08/06/2016   OSA (obstructive sleep apnea) 08/06/2014   Snoring 05/22/2014   Hypertension    Atrial flutter (HCC) 03/14/2014   Psoriasis 02/27/2013   Gout 02/27/2013   S/P CABG (coronary artery bypass graft) 06/29/2011   Coronary artery disease    Hypercholesterolemia     Past Surgical History:  Procedure Laterality Date   ATRIAL FLUTTER ABLATION  03/30/2014   ATRIAL FLUTTER ABLATION N/A 03/30/2014   Procedure: ATRIAL FLUTTER ABLATION;  Surgeon: Gardiner Rhyme, MD;  Location: [facility];  Service: Cardiovascular;  Laterality: N/A;   CARDIAC CATHETERIZATION  09/2003   Ejection fraction was estimated at 65%.   CARDIOVERSION N/A 03/15/2014    Procedure: CARDIOVERSION;  Surgeon: Lewayne Bunting, MD;  Location: [facility] ENDOSCOPY;  Service: Cardiovascular;  Laterality: N/A;   CARDIOVERSION N/A 07/17/2017   Procedure: CARDIOVERSION;  Surgeon: Quintella Reichert, MD;  Location: [facility] ENDOSCOPY;  Service: Cardiovascular;  Laterality: N/A;   CORONARY ARTERY BYPASS GRAFT  09/2003   [place]ima-lad,svg-diag,svg-om/distal LCX,svg-pda   TEE WITHOUT CARDIOVERSION N/A 03/15/2014   Procedure: TRANSESOPHAGEAL ECHOCARDIOGRAM (TEE);  Surgeon: Lewayne Bunting, MD;  Location: [facility] ENDOSCOPY;  Service: Cardiovascular;  Laterality: N/A;   TEE WITHOUT CARDIOVERSION N/A 07/17/2017   Procedure: TRANSESOPHAGEAL ECHOCARDIOGRAM (TEE);  Surgeon: Quintella Reichert, MD;  Location: [facility] ENDOSCOPY;  Service: Cardiovascular;  Laterality: N/A;   TONSILLECTOMY  1950's       Home Medications    Prior to Admission medications   Medication Sig Start Date End Date Taking? Authorizing Provider  atorvastatin (LIPITOR) 80 MG tablet Take 1 tablet (80 mg total) by mouth daily. 11/29/22  Yes [place], Peter M, MD  cholecalciferol (VITAMIN D) 1000 UNITS tablet Take 1,000 Units by mouth daily.   Yes [provider]  Coenzyme Q10 (CO Q 10 PO) Take 100 mg by mouth daily.    Yes [provider]  ELIQUIS 5 MG TABS tablet TAKE 1 TABLET BY MOUTH TWICE A DAY 04/22/23  Yes [place], Peter M,  MD  furosemide (LASIX) 20 MG tablet TAKE 1 TABLET BY MOUTH TWICE A DAY 01/16/23  Yes [place], Peter M, MD  losartan (COZAAR) 50 MG tablet TAKE 1 TABLET BY MOUTH EVERY DAY 11/09/22  Yes [place], Peter M, MD  metoprolol succinate (TOPROL-XL) 50 MG 24 hr tablet Take 1 tablet by mouth daily. 11/05/22  Yes [provider]  multivitamin (THERAGRAN) per tablet Take 1 tablet by mouth daily.   Yes [provider]  potassium chloride SA (K-DUR,KLOR-CON) 20 MEQ tablet Take 1 tablet (20 mEq total) by mouth daily. 07/08/17  Yes [place], Peter M, MD  predniSONE (DELTASONE) 10 MG tablet Take 4 tablets (40  mg total) by mouth daily for 2 days, THEN 3 tablets (30 mg total) daily for 2 days, THEN 2 tablets (20 mg total) daily for 2 days, THEN 1 tablet (10 mg total) daily for 2 days, THEN 0.5 tablets (5 mg total) daily for 2 days. 07/18/23 07/28/23 Yes Valentino Nose, NP  acetaminophen (TYLENOL) 325 MG tablet Take 2 tablets (650 mg total) by mouth every 6 (six) hours as needed for up to 30 doses for mild pain or moderate pain. 11/16/20   Terald Sleeper, MD  nitroGLYCERIN (NITROSTAT) 0.4 MG SL tablet PLACE 1 TABLET UNDER THE TONGUE EVERY 5 MINUTES AS NEEDED FOR CHEST PAIN. 05/17/23   [place], Peter M, MD    Family History Family History  Problem Relation Age of Onset   Heart disease Mother        cabg   Cancer Mother        breast   Stroke Mother     Social History Social History   Tobacco Use   Smoking status: Former    Current packs/day: 0.50    Average packs/day: 0.5 packs/day for 5.0 years (2.5 ttl pk-yrs)    Types: Cigarettes   Smokeless tobacco: Never   Tobacco comments:    "quit smoking cigarettes in the late 1970's"  Vaping Use   Vaping status: Never Used  Substance Use Topics   Alcohol use: Yes    Alcohol/week: 4.0 standard drinks of alcohol    Types: 2 Cans of beer, 2 Shots of liquor per week    Comment: 3 times per week, 2-3 drinks each time   Drug use: No     Allergies   Patient has no known allergies.   Review of Systems Review of Systems Per HPI  Physical Exam Triage Vital Signs ED Triage Vitals  Encounter Vitals Group     BP 07/18/23 1325 (!) 151/69     Systolic BP Percentile --      Diastolic BP Percentile --      Pulse Rate 07/18/23 1325 66     Resp 07/18/23 1325 20     Temp 07/18/23 1325 97.8 F (36.6 C)     Temp src --      SpO2 07/18/23 1325 97 %     Weight --      Height --      Head Circumference --      Peak Flow --      Pain Score 07/18/23 1323 6     Pain Loc --      Pain Education --      Exclude from Growth Chart --    No data  found.  Updated Vital Signs BP (!) 151/69   Pulse 66   Temp 97.8 F (36.6 C)   Resp 20   SpO2 97%  Visual Acuity Right Eye Distance:   Left Eye Distance:   Bilateral Distance:    Right Eye Near:   Left Eye Near:    Bilateral Near:     Physical Exam Vitals and nursing note reviewed.  Constitutional:      General: He is not in acute distress.    Appearance: Normal appearance. He is not toxic-appearing.  Pulmonary:     Effort: Pulmonary effort is normal. No respiratory distress.  Musculoskeletal:     Comments: Inspection: swelling to right third MCP joint with erythema and fluctuance; no bruising or obvious deformity Palpation: right third MCP and distal digit is tender to palpation; no obvious deformities palpated ROM: Full ROM to right third digit, although painful Strength: difficult to assess secondary to pain Neurovascular: neurovascularly intact in distal right upper third digit   Skin:    General: Skin is warm and dry.     Capillary Refill: Capillary refill takes less than 2 seconds.     Coloration: Skin is not jaundiced or pale.     Findings: No erythema.  Neurological:     Mental Status: He is alert and oriented to person, place, and time.  Psychiatric:        Behavior: Behavior is cooperative.      UC Treatments / Results  Labs (all labs ordered are listed, but only abnormal results are displayed) Labs Reviewed - No data to display  EKG   Radiology No results found.  Procedures Procedures (including critical care time)  Medications Ordered in UC Medications - No data to display  Initial Impression / Assessment and Plan / UC Course  I have reviewed the triage vital signs and the nursing notes.  Pertinent labs & imaging results that were available during my care of the patient were reviewed by me and considered in my medical decision making (see chart for details).   Patient is well-appearing, afebrile, not tachycardic, not tachypneic,  oxygenating well on room air.  Patient is mildly hypertensive in triage.   1. Right hand pain 2. History of gout Treat with prednisone taper Other supportive care discussed Recommended returning if symptoms persist/worsen despite treatment, specifically if the fluctuant area starts draining or opens up Strict ER precautions discussed with patient  The patient was given the opportunity to ask questions.  All questions answered to their satisfaction.  The patient is in agreement to this plan.    Final Clinical Impressions(s) / UC Diagnoses   Final diagnoses:  Right hand pain  History of gout     Discharge Instructions      Take the prednisone as prescribed for gout.  If symptoms do not improve with treatment or if they worsen with treatment, seek care.    ED Prescriptions     Medication Sig Dispense Auth. Provider   predniSONE (DELTASONE) 10 MG tablet Take 4 tablets (40 mg total) by mouth daily for 2 days, THEN 3 tablets (30 mg total) daily for 2 days, THEN 2 tablets (20 mg total) daily for 2 days, THEN 1 tablet (10 mg total) daily for 2 days, THEN 0.5 tablets (5 mg total) daily for 2 days. 21 tablet Valentino Nose, NP      PDMP not reviewed this encounter.   Valentino Nose, NP 07/18/23 [phone]

## 2023-07-18 NOTE — Telephone Encounter (Signed)
Copied from CRM [id number]. Topic: Clinical - Medical Advice >> Jul 18, 2023  9:11 AM Almira Coaster wrote: Reason for CRM: Patient called to advise that he has been dealing with a flare up of gout for the last week or so and wanted to be seen at the office today; however, there was no availability. He wanted to ask Dr.Burchetter if he can send over a prescription to the pharmacy to help or if he would approve an over book to see him today.

## 2023-07-20 ENCOUNTER — Other Ambulatory Visit: Payer: Self-pay | Admitting: Medical Genetics

## 2023-07-28 DIAGNOSIS — G4733 Obstructive sleep apnea (adult) (pediatric): Secondary | ICD-10-CM | POA: Diagnosis not present

## 2023-07-31 ENCOUNTER — Ambulatory Visit: Payer: Medicare HMO | Admitting: Family Medicine

## 2023-07-31 ENCOUNTER — Encounter: Payer: Self-pay | Admitting: Family Medicine

## 2023-07-31 VITALS — BP 142/60 | HR 76 | Temp 97.6°F | Wt 204.0 lb

## 2023-07-31 DIAGNOSIS — K219 Gastro-esophageal reflux disease without esophagitis: Secondary | ICD-10-CM | POA: Diagnosis not present

## 2023-07-31 DIAGNOSIS — M1A9XX1 Chronic gout, unspecified, with tophus (tophi): Secondary | ICD-10-CM

## 2023-07-31 DIAGNOSIS — R053 Chronic cough: Secondary | ICD-10-CM | POA: Diagnosis not present

## 2023-07-31 MED ORDER — ALLOPURINOL 100 MG PO TABS: ORAL_TABLET | ORAL | 2 refills | Status: DC

## 2023-07-31 NOTE — Progress Notes (Signed)
 Established Patient Office Visit  Subjective   Patient ID: [name] [name], male    DOB: [birth year]  Age: 73 y.o. MRN: [id number]  Chief Complaint  Patient presents with   Gout        Gastroesophageal Reflux    HPI   [name] [name] was seen today to discuss the following items  Longstanding history of gout.  Interestingly, no history of MTP joint involvement but has had involvement of other joints especially hands.  Current or most recent involvement of right hand third MCP joint.  He has some swelling and tophaceous changes there now.  Had been red and hot several weeks ago.  He went to urgent care on 26 December and was prescribed prednisone .  He states his gout has become more frequent in recent years occurring generally 3-4 times per year for acute flareups.  Last uric acid was 7.1.  He has been reluctant in the past to consider allopurinol  but is willing to consider at this point.  He wants to avoid any destructive joint changes.  No recent dietary changes.  No regular alcohol.  Other issue is chronic cough for really well 6 months now.  He is taking experimental drug versus placebo for his psoriasis and wonders if his cough may be related to that.  He states he had chest x-ray back in the summer which is part of the study designed to go on this medication.  He does relate more frequent GERD symptoms in the recent months.  No dysphagia.  Symptoms seem to be worse at night.  No productive cough.  No hemoptysis.  No appetite or weight changes.  No chest pain.  Non-smoker. He is also aware of some occasional postnasal drip symptoms especially at night.  No ACE inhibitor use.  No history of asthma.  No obvious wheezing.  Past Medical History:  Diagnosis Date   Chicken pox    Coronary artery disease    Severe three-vessel coronary artery disease with ejection fraction of 65%   GERD (gastroesophageal reflux disease)    History of gout    History of psoriasis    Hypercholesterolemia     Hypertension    OSA (obstructive sleep apnea)    Moderate with AHI of 16.9/hr now on CPAP at 10cm H2O   Psoriatic arthritis (HCC)    Past Surgical History:  Procedure Laterality Date   ATRIAL FLUTTER ABLATION  03/30/2014   ATRIAL FLUTTER ABLATION N/A 03/30/2014   Procedure: ATRIAL FLUTTER ABLATION;  Surgeon: Lynwood JONETTA Rakers, MD;  Location: [facility] CATH [facility];  Service: Cardiovascular;  Laterality: N/A;   CARDIAC CATHETERIZATION  09/2003   Ejection fraction was estimated at 65%.   CARDIOVERSION N/A 03/15/2014   Procedure: CARDIOVERSION;  Surgeon: Redell GORMAN Shallow, MD;  Location: [facility] ENDOSCOPY;  Service: Cardiovascular;  Laterality: N/A;   CARDIOVERSION N/A 07/17/2017   Procedure: CARDIOVERSION;  Surgeon: Shlomo Wilbert SAUNDERS, MD;  Location: [facility] ENDOSCOPY;  Service: Cardiovascular;  Laterality: N/A;   CORONARY ARTERY BYPASS GRAFT  09/2003   [place]-lad,svg-diag,svg-om/distal LCX,svg-pda   TEE WITHOUT CARDIOVERSION N/A 03/15/2014   Procedure: TRANSESOPHAGEAL ECHOCARDIOGRAM (TEE);  Surgeon: Redell GORMAN Shallow, MD;  Location: [facility] ENDOSCOPY;  Service: Cardiovascular;  Laterality: N/A;   TEE WITHOUT CARDIOVERSION N/A 07/17/2017   Procedure: TRANSESOPHAGEAL ECHOCARDIOGRAM (TEE);  Surgeon: Shlomo Wilbert SAUNDERS, MD;  Location: [facility] ENDOSCOPY;  Service: Cardiovascular;  Laterality: N/A;   TONSILLECTOMY  1950's    reports that he has quit smoking. His smoking use included cigarettes. He has  a 2.5 pack-year smoking history. He has never used smokeless tobacco. He reports current alcohol use of about 4.0 standard drinks of alcohol per week. He reports that he does not use drugs. family history includes Cancer in his mother; Heart disease in his mother; Stroke in his mother. No Known Allergies  Review of Systems  Constitutional:  Negative for chills, fever and weight loss.  HENT:  Positive for congestion. Negative for sore throat.   Respiratory:  Positive for cough. Negative for hemoptysis, sputum production, shortness of breath and  wheezing.   Cardiovascular:  Negative for chest pain.      Objective:     BP (!) 142/60 (BP Location: Left Arm, Patient Position: Sitting, Cuff Size: Normal)   Pulse 76   Temp 97.6 F (36.4 C) (Oral)   Wt 204 lb (92.5 kg)   SpO2 97%   BMI 26.91 kg/m  BP Readings from Last 3 Encounters:  07/31/23 (!) 142/60  07/18/23 (!) 151/69  06/04/23 118/66   Wt Readings from Last 3 Encounters:  07/31/23 204 lb (92.5 kg)  06/04/23 199 lb 9.6 oz (90.5 kg)  03/14/23 204 lb 9.6 oz (92.8 kg)      Physical Exam Vitals reviewed.  Constitutional:      General: He is not in acute distress.    Appearance: Normal appearance. He is not ill-appearing.  HENT:     Head: Normocephalic and atraumatic.  Cardiovascular:     Rate and Rhythm: Normal rate.  Pulmonary:     Effort: Pulmonary effort is normal.     Breath sounds: Normal breath sounds. No wheezing, rhonchi or rales.  Musculoskeletal:     Comments: Right hand reveals some mild swelling third MCP joint.  Nontender.  Appears to have some color changes consistent with likely tophaceous changes.  No breaks in the skin noted.  No warmth.  Neurological:     Mental Status: He is alert.      No results found for any visits on 07/31/23.  Last CBC Lab Results  Component Value Date   WBC 5.1 03/06/2022   HGB 15.0 03/06/2022   HCT 44.3 03/06/2022   MCV 91.7 03/06/2022   MCH 31.9 10/19/2020   RDW 13.5 03/06/2022   PLT 157.0 03/06/2022   Last metabolic panel Lab Results  Component Value Date   GLUCOSE 97 03/06/2022   NA 139 03/06/2022   K 4.6 03/06/2022   CL 102 03/06/2022   CO2 29 03/06/2022   BUN 15 03/06/2022   CREATININE 0.85 03/06/2022   GFR 86.18 03/06/2022   CALCIUM  9.7 03/06/2022   PROT 6.6 03/06/2022   ALBUMIN 4.5 03/06/2022   LABGLOB 2.2 07/04/2017   AGRATIO 2.1 07/04/2017   BILITOT 1.1 03/06/2022   ALKPHOS 74 03/06/2022   AST 20 03/06/2022   ALT 16 03/06/2022   ANIONGAP 11 10/19/2020   Last lipids Lab Results   Component Value Date   CHOL 141 06/04/2023   HDL 64 06/04/2023   LDLCALC 65 06/04/2023   TRIG 54 06/04/2023   CHOLHDL 2.2 06/04/2023   Last thyroid  functions Lab Results  Component Value Date   TSH 0.85 03/06/2022      The 10-year ASCVD risk score (Arnett DK, et al., 2019) is: 25.8%    Assessment & Plan:   #1 gout.  More frequent episodes especially over the past year.  He has colchicine  to use for acute flareups.  Recently treated with prednisone .  Does have some very likely tophaceous changes right  hand now.  Given increase in frequency of gout and tophaceous changes recommend prophylaxis with allopurinol . -We discussed starting allopurinol  100 mg once daily for 2 weeks then increase to 200 mg daily for 2 weeks and then 300 mg daily -Reiterated importance of good hydration and low purine diet -Start colchicine  0.6 mg once daily during allopurinol  titration to reduce risk of flareup -Set up 19-month follow-up.  Plan to recheck A1c at that point with goal uric acid less than 6.  Will hopefully transition to 300 mg allopurinol  at that time.  #2 chronic cough.  Question upper airway cough syndrome.  Does have some postnasal drip symptoms at night.  Also is having some intermittent GERD symptoms.  We discussed several items as follows  -Consider trial of over-the-counter chlorpheniramine 4 mg nightly -Avoid eating within 3 to 4 hours of bedtime -Elevate head of bed 4 to 6 inches-already done -Handout on GERD given with foods to avoid -Recommend he consider trial of over-the-counter Prilosec or Nexium 20 mg daily -Reassess at 110-month follow-up   Return in about 2 months (around 09/28/2023).    Wolm Scarlet, MD

## 2023-07-31 NOTE — Patient Instructions (Addendum)
 Start Colchicine 0.6 mg once daily--UNTIL we can get on full dose of Allopurinol    Consider trial of OTC Nexium or Prilosec 20 mg daily  Consider trial of OTC chlorpheniramine 4 mg at night.

## 2023-08-01 ENCOUNTER — Encounter: Payer: Self-pay | Admitting: Family Medicine

## 2023-08-01 DIAGNOSIS — G4733 Obstructive sleep apnea (adult) (pediatric): Secondary | ICD-10-CM | POA: Diagnosis not present

## 2023-08-02 MED ORDER — PREDNISONE 20 MG PO TABS: ORAL_TABLET | ORAL | 0 refills | Status: DC

## 2023-08-02 MED ORDER — COLCHICINE 0.6 MG PO TABS: ORAL_TABLET | ORAL | 2 refills | Status: DC

## 2023-08-08 DIAGNOSIS — Z9889 Other specified postprocedural states: Secondary | ICD-10-CM | POA: Diagnosis not present

## 2023-08-08 DIAGNOSIS — I4891 Unspecified atrial fibrillation: Secondary | ICD-10-CM | POA: Diagnosis not present

## 2023-08-08 DIAGNOSIS — I483 Typical atrial flutter: Secondary | ICD-10-CM | POA: Diagnosis not present

## 2023-08-08 DIAGNOSIS — Z7901 Long term (current) use of anticoagulants: Secondary | ICD-10-CM | POA: Diagnosis not present

## 2023-08-08 DIAGNOSIS — I351 Nonrheumatic aortic (valve) insufficiency: Secondary | ICD-10-CM | POA: Diagnosis not present

## 2023-08-08 DIAGNOSIS — Z8679 Personal history of other diseases of the circulatory system: Secondary | ICD-10-CM | POA: Diagnosis not present

## 2023-08-08 DIAGNOSIS — I4892 Unspecified atrial flutter: Secondary | ICD-10-CM | POA: Diagnosis not present

## 2023-08-08 DIAGNOSIS — I4819 Other persistent atrial fibrillation: Secondary | ICD-10-CM | POA: Diagnosis not present

## 2023-08-08 DIAGNOSIS — Z951 Presence of aortocoronary bypass graft: Secondary | ICD-10-CM | POA: Diagnosis not present

## 2023-08-09 DIAGNOSIS — I4819 Other persistent atrial fibrillation: Secondary | ICD-10-CM | POA: Diagnosis not present

## 2023-08-28 ENCOUNTER — Other Ambulatory Visit: Payer: Self-pay | Admitting: Cardiology

## 2023-09-01 DIAGNOSIS — G4733 Obstructive sleep apnea (adult) (pediatric): Secondary | ICD-10-CM | POA: Diagnosis not present

## 2023-09-16 NOTE — Progress Notes (Unsigned)
 Assessment/Plan:   1.  Parkinsons Disease, mild  -declines levodopa.  Understands AAN recommendations.  We can discuss in future  -Patient sought second opinion at [facility] a while back, when he didn't fully meet criteria.  Both here and at [facility], patient had declined DaTscan and skin biopsies.    -he is following with dermatology regularly   -exercise   2.  Mild dysphagia  -he reports that this is normal right now  -MBE done December, 2023 was normal.  Regular diet with thin liquid recommended.  3.  atrial fibrillation  -Status post multiple cardioversions.  Follows with [facility] as well as [facility].  Subjective:   [name] [name] was seen today in follow up for parkinsonism.  Outside records that were made available to me were reviewed.  We have followed him for some mild parkinsonism.  Patient was in the hospital at [facility] in September for cardioversion, but unfortunately the atrial fibrillation started again in January.  He underwent cardioversion again August 09, 2023.  He is still doing rsb, tues/thurs.  No falls.  No passing out.  Tremor may be a tad worse and mostly on the L but occasionally on the right.    Current prescribed movement disorder medications: None   ALLERGIES:  No Known Allergies  CURRENT MEDICATIONS:  Current Meds  Medication Sig   acetaminophen (TYLENOL) 325 MG tablet Take 2 tablets (650 mg total) by mouth every 6 (six) hours as needed for up to 30 doses for mild pain or moderate pain.   allopurinol (ZYLOPRIM) 100 MG tablet Take one tablet by mouth once daily for two weeks and then two tablets by mouth once daily for two weeks and then three tablets by mouth daily.   atorvastatin (LIPITOR) 80 MG tablet Take 1 tablet (80 mg total) by mouth daily.   cholecalciferol (VITAMIN D) 1000 UNITS tablet Take 1,000 Units by mouth daily.   Coenzyme Q10 (CO Q 10 PO) Take 100 mg by mouth daily.    colchicine 0.6 MG tablet Take two at onset of gout and then one by mouth  2 times daily as needed   ELIQUIS 5 MG TABS tablet TAKE 1 TABLET BY MOUTH TWICE A DAY   furosemide (LASIX) 20 MG tablet TAKE 1 TABLET BY MOUTH TWICE A DAY   losartan (COZAAR) 50 MG tablet TAKE 1 TABLET BY MOUTH EVERY DAY   metoprolol succinate (TOPROL-XL) 50 MG 24 hr tablet Take 1 tablet by mouth daily.   multivitamin (THERAGRAN) per tablet Take 1 tablet by mouth daily.   nitroGLYCERIN (NITROSTAT) 0.4 MG SL tablet PLACE 1 TABLET UNDER THE TONGUE EVERY 5 MINUTES AS NEEDED FOR CHEST PAIN.   potassium chloride SA (K-DUR,KLOR-CON) 20 MEQ tablet Take 1 tablet (20 mEq total) by mouth daily.     Objective:   PHYSICAL EXAMINATION:    VITALS:   Vitals:   09/19/23 0910  Weight: 207 lb 3.2 oz (94 kg)  Height: 6\' 1"  (1.854 m)     GEN:  The patient appears stated age and is in NAD. HEENT:  Normocephalic, atraumatic.  The mucous membranes are moist.  CV:  RRR Lungs:  CTAB  Neurological examination:  Orientation: The patient is alert and oriented x3. Cranial nerves: There is good facial symmetry without facial hypomimia. The speech is fluent and clear. Soft palate rises symmetrically and there is no tongue deviation. Hearing is intact to conversational tone. Sensation: Sensation is intact to light touch throughout Motor: Strength is  at least antigravity x4.  Movement examination: Tone: There is mild increased tone in the LUE  Abnormal movements: there is left upper extremity rest tremor  Coordination:  There is decremation only with toe taps on the L Gait and Station: The patient has no difficulty arising out of a deep-seated chair without the use of the hands. The patient's stride length is good with LUE rest tremor (same as previous).    I have reviewed and interpreted the following labs independently    Chemistry      Component Value Date/Time   NA 139 03/06/2022 0909   NA 141 09/13/2021 0000   K 4.6 03/06/2022 0909   CL 102 03/06/2022 0909   CO2 29 03/06/2022 0909   BUN 15  03/06/2022 0909   BUN 8 09/13/2021 0000   CREATININE 0.85 03/06/2022 0909   CREATININE 0.79 02/26/2020 0916   GLU 102 09/13/2021 0000      Component Value Date/Time   CALCIUM 9.7 03/06/2022 0909   ALKPHOS 74 03/06/2022 0909   AST 20 03/06/2022 0909   ALT 16 03/06/2022 0909   BILITOT 1.1 03/06/2022 0909   BILITOT 1.2 11/30/2019 0827       Lab Results  Component Value Date   WBC 5.1 03/06/2022   HGB 15.0 03/06/2022   HCT 44.3 03/06/2022   MCV 91.7 03/06/2022   PLT 157.0 03/06/2022    Lab Results  Component Value Date   TSH 0.85 03/06/2022   Total time spent on today's visit was 30 minutes, including both face-to-face time and nonface-to-face time.  Time included that spent on review of records (prior notes available to me/labs/imaging if pertinent), discussing treatment and goals, answering patient's questions and coordinating care.    Cc:  Kristian Covey, MD

## 2023-09-19 ENCOUNTER — Ambulatory Visit: Payer: Medicare HMO | Admitting: Neurology

## 2023-09-19 ENCOUNTER — Encounter: Payer: Self-pay | Admitting: Neurology

## 2023-09-19 VITALS — Ht 73.0 in | Wt 207.2 lb

## 2023-09-19 DIAGNOSIS — G20A1 Parkinson's disease without dyskinesia, without mention of fluctuations: Secondary | ICD-10-CM

## 2023-09-19 NOTE — Patient Instructions (Signed)
Good to see you!  The physicians and staff at [facility] are committed to providing excellent care. You may receive a survey requesting feedback about your experience at our office. We strive to receive "very good" responses to the survey questions. If you feel that your experience would prevent you from giving the office a "very good " response, please contact our office to try to remedy the situation. We may be reached at [phone]. Thank you for taking the time out of your busy day to complete the survey.

## 2023-10-03 DIAGNOSIS — Z86006 Personal history of melanoma in-situ: Secondary | ICD-10-CM | POA: Diagnosis not present

## 2023-10-03 DIAGNOSIS — Z08 Encounter for follow-up examination after completed treatment for malignant neoplasm: Secondary | ICD-10-CM | POA: Diagnosis not present

## 2023-10-03 DIAGNOSIS — L821 Other seborrheic keratosis: Secondary | ICD-10-CM | POA: Diagnosis not present

## 2023-10-03 DIAGNOSIS — L814 Other melanin hyperpigmentation: Secondary | ICD-10-CM | POA: Diagnosis not present

## 2023-10-03 DIAGNOSIS — D225 Melanocytic nevi of trunk: Secondary | ICD-10-CM | POA: Diagnosis not present

## 2023-10-03 DIAGNOSIS — L400 Psoriasis vulgaris: Secondary | ICD-10-CM | POA: Diagnosis not present

## 2023-10-26 ENCOUNTER — Other Ambulatory Visit: Payer: Self-pay | Admitting: Family Medicine

## 2023-10-27 ENCOUNTER — Other Ambulatory Visit: Payer: Self-pay | Admitting: Cardiology

## 2023-10-28 ENCOUNTER — Telehealth: Payer: Self-pay | Admitting: Cardiology

## 2023-10-28 DIAGNOSIS — I4891 Unspecified atrial fibrillation: Secondary | ICD-10-CM

## 2023-10-28 MED ORDER — APIXABAN 5 MG PO TABS: 5.0000 mg | ORAL_TABLET | Freq: Two times a day (BID) | ORAL | 5 refills | Status: DC

## 2023-10-28 NOTE — Telephone Encounter (Signed)
*  STAT* If patient is at the pharmacy, call can be transferred to refill team.   1. Which medications need to be refilled? (please list name of each medication and dose if known)   ELIQUIS 5 MG TABS tablet   2. Would you like to learn more about the convenience, safety, & potential cost savings by using the [facility]?   3. Are you open to using the [facility] (Type [facility]. ).  4. Which pharmacy/location (including street and city if local pharmacy) is medication to be sent to?  [id number] - [place], [place] - [address] AT [address]   5. Do they need a 30 day or 90 day supply?   30 day  Patient stated he has 2-3 days left of medication.

## 2023-10-28 NOTE — Telephone Encounter (Signed)
 Prescription refill request for Eliquis received. Indication: Afib  Last office visit: 06/04/23 ([place])  Scr: 1.0 (04/01/23)  Age: 73 Weight: 94kg  Appropriate dose. Refill sent.

## 2023-10-31 ENCOUNTER — Other Ambulatory Visit (HOSPITAL_COMMUNITY)
Admission: RE | Admit: 2023-10-31 | Discharge: 2023-10-31 | Disposition: A | Discharge status: Home / Self Care | Payer: Self-pay | Source: Ambulatory Visit | Attending: Medical Genetics | Admitting: Medical Genetics

## 2023-11-04 DIAGNOSIS — L400 Psoriasis vulgaris: Secondary | ICD-10-CM | POA: Diagnosis not present

## 2023-11-08 LAB — GENECONNECT MOLECULAR SCREEN: Genetic Analysis Overall Interpretation: NEGATIVE

## 2023-11-25 ENCOUNTER — Encounter: Payer: Self-pay | Admitting: Cardiology

## 2023-11-25 ENCOUNTER — Ambulatory Visit: Payer: Medicare HMO | Attending: Cardiology | Admitting: Cardiology

## 2023-11-25 VITALS — BP 126/58 | HR 62 | Ht 73.0 in | Wt 209.4 lb

## 2023-11-25 DIAGNOSIS — I10 Essential (primary) hypertension: Secondary | ICD-10-CM

## 2023-11-25 DIAGNOSIS — G4733 Obstructive sleep apnea (adult) (pediatric): Secondary | ICD-10-CM

## 2023-11-25 NOTE — Patient Instructions (Signed)
 Medication Instructions:  Your physician recommends that you continue on your current medications as directed. Please refer to the Current Medication list given to you today.  *If you need a refill on your cardiac medications before your next appointment, please call your pharmacy*  Lab Work: NONE If you have labs (blood work) drawn today and your tests are completely normal, you will receive your results only by: MyChart Message (if you have MyChart) OR A paper copy in the mail If you have any lab test that is abnormal or we need to change your treatment, we will call you to review the results.  Testing/Procedures: NONE  Follow-Up: At [facility], you and your health needs are our priority.  As part of our continuing mission to provide you with exceptional heart care, our providers are all part of one team.  This team includes your primary Cardiologist (physician) and Advanced Practice Providers or APPs (Physician Assistants and Nurse Practitioners) who all work together to provide you with the care you need, when you need it.  Your next appointment:   1 year(s)  Provider:   Micael Adas, MD  We recommend signing up for the patient portal called "MyChart".  Sign up information is provided on this After Visit Summary.  MyChart is used to connect with patients for Virtual Visits (Telemedicine).  Patients are able to view lab/test results, encounter notes, upcoming appointments, etc.  Non-urgent messages can be sent to your provider as well.   To learn more about what you can do with MyChart, go to [email or web address].   Other Instructions

## 2023-11-25 NOTE — Progress Notes (Signed)
 Date:  11/25/2023   ID:  [name] [name], DOB December 29, 1950, MRN [id number]  PCP:  Marquetta Sit, MD  Cardiologist:  Peter [place], MD Sleep Medicine:  Gaylyn Keas, MD Electrophysiologist:  None   Chief Complaint:  OSA, HTN  History of Present Illness:    [name] [name] is a 73 y.o. male  with a hx of moderate OSA with an AHI of 16.9 events per hour mainly occurring in the non supine position and during NREM sleep.  He is on CPAP at 10cm H2O.    He is doing well with his PAP device and thinks that he has gotten used to it.  HE tolerates the mask and feels the pressure is adequate.  Since going on PAP he feels rested in the am and has no significant daytime sleepiness.  He denies any nasal dryness or nasal congestion.  He has been having mouth dryness but lost his chin strap.  He does not think that he snores.    Prior CV studies:   The following studies were reviewed today:  PAP compliance download from [employer]  Past Medical History:  Diagnosis Date   Chicken pox    Coronary artery disease    Severe three-vessel coronary artery disease with ejection fraction of 65%   GERD (gastroesophageal reflux disease)    History of gout    History of psoriasis    Hypercholesterolemia    Hypertension    OSA (obstructive sleep apnea)    Moderate with AHI of 16.9/hr now on CPAP at 10cm H2O   Psoriatic arthritis (HCC)    Past Surgical History:  Procedure Laterality Date   ATRIAL FLUTTER ABLATION  03/30/2014   ATRIAL FLUTTER ABLATION N/A 03/30/2014   Procedure: ATRIAL FLUTTER ABLATION;  Surgeon: Ellaree Gunther, MD;  Location: [facility] CATH LAB;  Service: Cardiovascular;  Laterality: N/A;   CARDIAC CATHETERIZATION  09/2003   Ejection fraction was estimated at 65%.   CARDIOVERSION N/A 03/15/2014   Procedure: CARDIOVERSION;  Surgeon: Lenise Quince, MD;  Location: [facility] ENDOSCOPY;  Service: Cardiovascular;  Laterality: N/A;   CARDIOVERSION N/A 07/17/2017   Procedure: CARDIOVERSION;  Surgeon: Jacqueline Matsu, MD;  Location: [facility] ENDOSCOPY;  Service: Cardiovascular;  Laterality: N/A;   CORONARY ARTERY BYPASS GRAFT  09/2003   [place]ima-lad,svg-diag,svg-om/distal LCX,svg-pda   TEE WITHOUT CARDIOVERSION N/A 03/15/2014   Procedure: TRANSESOPHAGEAL ECHOCARDIOGRAM (TEE);  Surgeon: Lenise Quince, MD;  Location[facility] ENDOSCOPY;  Service: Cardiovascular;  Laterality: N/A;   TEE WITHOUT CARDIOVERSION N/A 07/17/2017   Procedure: TRANSESOPHAGEAL ECHOCARDIOGRAM (TEE);  Surgeon: Jacqueline Matsu, MD;  Location: [facility] ENDOSCOPY;  Service: Cardiovascular;  Laterality: N/A;   TONSILLECTOMY  1950's     Current Meds  Medication Sig   acetaminophen  (TYLENOL ) 325 MG tablet Take 2 tablets (650 mg total) by mouth every 6 (six) hours as needed for up to 30 doses for mild pain or moderate pain.   allopurinol  (ZYLOPRIM ) 100 MG tablet TAKE 1 TABLET BY MOUTH ONCE DAILY FOR 2 WEEKS, 2 TABS DAILY FOR 2 WEEKS, THEN 3 TABS DAILY   apixaban  (ELIQUIS ) 5 MG TABS tablet Take 1 tablet (5 mg total) by mouth 2 (two) times daily.   atorvastatin  (LIPITOR) 80 MG tablet Take 1 tablet (80 mg total) by mouth daily.   cholecalciferol  (VITAMIN D) 1000 UNITS tablet Take 1,000 Units by mouth daily.   Coenzyme Q10 (CO Q 10 PO) Take 100 mg by mouth daily.    colchicine  0.6 MG tablet Take  two at onset of gout and then one by mouth 2 times daily as needed   furosemide  (LASIX ) 20 MG tablet TAKE 1 TABLET BY MOUTH TWICE A DAY   losartan  (COZAAR ) 50 MG tablet TAKE 1 TABLET BY MOUTH EVERY DAY   metoprolol  succinate (TOPROL -XL) 50 MG 24 hr tablet Take 1 tablet by mouth daily.   multivitamin (THERAGRAN) per tablet Take 1 tablet by mouth daily.   nitroGLYCERIN  (NITROSTAT ) 0.4 MG SL tablet PLACE 1 TABLET UNDER THE TONGUE EVERY 5 MINUTES AS NEEDED FOR CHEST PAIN.   potassium chloride  SA (K-DUR,KLOR-CON ) 20 MEQ tablet Take 1 tablet (20 mEq total) by mouth daily.   SKYRIZI PEN 150 MG/ML pen      Allergies:   Patient has no known allergies.   Social History    Tobacco Use   Smoking status: Former    Current packs/day: 0.50    Average packs/day: 0.5 packs/day for 5.0 years (2.5 ttl pk-yrs)    Types: Cigarettes   Smokeless tobacco: Never   Tobacco comments:    "quit smoking cigarettes in the late 1970's"  Vaping Use   Vaping status: Never Used  Substance Use Topics   Alcohol use: Yes    Alcohol/week: 4.0 standard drinks of alcohol    Types: 2 Cans of beer, 2 Shots of liquor per week    Comment: 3 times per week, 2-3 drinks each time   Drug use: No     Family Hx: The patient's family history includes Cancer in his mother; Heart disease in his mother; Stroke in his mother.  ROS:   Please see the history of present illness.     All other systems reviewed and are negative.   Labs/Other Tests and Data Reviewed:    Recent Labs: No results found for requested labs within last 365 days.   Recent Lipid Panel Lab Results  Component Value Date/Time   CHOL 141 06/04/2023 10:06 AM   TRIG 54 06/04/2023 10:06 AM   HDL 64 06/04/2023 10:06 AM   CHOLHDL 2.2 06/04/2023 10:06 AM   CHOLHDL 2 03/06/2022 09:09 AM   LDLCALC 65 06/04/2023 10:06 AM    Wt Readings from Last 3 Encounters:  11/25/23 209 lb 6.4 oz (95 kg)  09/19/23 207 lb 3.2 oz (94 kg)  07/31/23 204 lb (92.5 kg)     Objective:    VS:  BP 136/22mmHg, HR 61bpm GEN: Well nourished, well developed in no acute distress HEENT: Normal NECK: No JVD; No carotid bruits LYMPHATICS: No lymphadenopathy CARDIAC:RRR, no murmurs, rubs, gallops RESPIRATORY:  Clear to auscultation without rales, wheezing or rhonchi  ABDOMEN: Soft, non-tender, non-distended MUSCULOSKELETAL:  No edema; No deformity  SKIN: Warm and dry NEUROLOGIC:  Alert and oriented x 3 PSYCHIATRIC:  Normal affect  ASSESSMENT & PLAN:    OSA - The patient is tolerating PAP therapy well without any problems. The PAP download performed by his DME was personally reviewed and interpreted by me today and showed an AHI of 3.8  /hr on 8 cm H2O with 100% compliance in using more than 4 hours nightly.  The patient has been using and benefiting from PAP use and will continue to benefit from therapy.  -I will order a chin strap  2.  HTN - BP controlled on today - Continue prescription drug management with losartan  50 mg daily and Toprol -XL 50 mg daily with as needed refills  Medication Adjustments/Labs and Tests Ordered: Current medicines are reviewed at length with the patient today.  Concerns regarding medicines are outlined above.  Tests Ordered: No orders of the defined types were placed in this encounter.  Medication Changes: No orders of the defined types were placed in this encounter.   Disposition:  Follow up in 1 year(s)  Signed, Gaylyn Keas, MD  11/25/2023 11:14 AM    [facility]

## 2023-11-28 NOTE — Progress Notes (Deleted)
 Cardiology Office Note   Date:  11/28/2023   ID:  [name] [name] 1951/01/15, MRN [id number]  PCP:  [name] Sit, MD  Cardiologist:   Rj Pedrosa [place], MD   No chief complaint on file.      History of Present Illness: [name] [name] is a 73 y.o. male who is seen for follow up Afib, CAD, AI, and CHF. He has a history of  atrial flutter and CAD. He is status post CABG in 2005. He had a normal stress echo in May of 2013. In August 2015 he presented with atrial flutter with RVR. He had mildly elevated troponins. He had successful TEE guided DCCV. He then underwent ablation of atrial flutter and ectopic atrial tachycardia on 03/30/14 by Dr. Nunzio Belch.   Stress Myoview  September 2015 demonstrated excellent exercise tolerance and normal perfusion. TEE showed normal LV function, mild biatrial enlargement, mild MR,TR,AI. He does have moderate obstructive sleep apnea followed by Dr. Micael Adas and is on CPAP therapy.  He was seen in December 2018 with  an irregular and fast pulse. Was found to be in AFib with RVR. Echo showed EF 40-45% with moderate MR and AI and moderate biatrial enlargement. Toprol  dose was increased and he was started on Eliquis . Underwent TEE guided DCCV on 07/17/17. Echo showed EF 40% with moderate biatrial enlargement. Moderate to severe MR, moderate TR, patent foramen ovale. He was successfully cardioverted but had recurrence of Afib on 07/24/17. He was seen in Afib clinic with Dr. Nunzio Belch for consideration of Afib ablation versus AAD therapy. There was discussion about starting him on Tikosyn. Given new LV dysfunction and valvular disease it was felt that right and left heart cath were indicated prior to initiation of AAD therapy.  He also wore a monitor that showed episodes of wide complex tachycardia and it was unclear whether this was aberrancy versus VT.  He got a  second opinion at [facility] with Dr. Arvell Birchwood. He was admitted in February 2019 and had a nuclear stress test showing no  ischemia. EF 44%. He was loaded with Tikosyn and DCCV was performed. he had significant QT prolongation and Tikosyn was discontinued. He later underwent Cardiac MRI. He underwent AFib ablation with pulmonary vein isolation on 10/15/17. Echo following ablation showed EF 50-55%.  Both MRI and Echo showed evidence of moderate to severe AI. LV function had returned to normal with restoration of NSR.  In June 2020 he had recurrent Afib and underwent DCCV at [facility]. On follow up he was noted to be bradycardic and metoprolol  dose was reduced. In September 2020 he had an Echo at [facility] showing normal LV function and moderate AI. He is followed regularly by Dr Donna Fus at [facility]. Seen there in August. Echo done showing moderate AI. Normal LV function.   He presented to the ER on November 16, 2020 with fast heart beat: SVT vs fib/flutter.  Adenosine  given with short break to 10 beats of VT, then back to SVT with RBBB. DCCV was about to be performed, but he converted on his own when they sat the patient up. He was discharged without admission. He sent monitor strips from 4/28 with Afib in the 170s. BB was increased. Strips were faxed to his [facility] cardiologist. Had first occurrence of Afib RVR on March 30 - HR 190s. He converted to NSR. Marvell Slider and broke ribs on April 27th. Three dogs jumped on him and he fell and broke 4 ribs on right side (3-6). No head injury,  no LOC. Monitor showed Afib 4/28-5/7 - could be related to his rib injury. On subsequent follow up in the office had no recurrence of Afib.    He underwent redo AFib and Atrial tachycardia ablation by Dr Arvell Birchwood at [facility] on 09/12/21. Prior to this he had cardiac MRI showing mod-severe AI, mild LV enlargement - really unchanged from 2019.  Echo in Dec 2022 showed normal LV function and moderate AI.   He was seen by [facility] EP in April  and doing well in NSR. His metoprolol  dose was reduced to 50 mg daily.  Has seen Neurology (Dr Tat) for evaluation of mild Parkinson's symptoms.    In August he had recurrent AFib and underwent DCCV at [facility]. He had early recurrence and was admitted in September for  repeat DCCV. QT felt to be too long for Dofetilide. Consideration for amiodarone vs ranolazine for recurrence.  He notes when in AFib his HR is persistently high 160 range and he feels tired. No chest pain or SOB. No edema.   He was seen in Jan at [place]. Noted Echo showed moderate AI. Was back in AFib at that time and underwent DCCV again. He underwent another Afib ablation on Jan 20.    Past Medical History:  Diagnosis Date   Chicken pox    Coronary artery disease    Severe three-vessel coronary artery disease with ejection fraction of 65%   GERD (gastroesophageal reflux disease)    History of gout    History of psoriasis    Hypercholesterolemia    Hypertension    OSA (obstructive sleep apnea)    Moderate with AHI of 16.9/hr now on CPAP at 10cm H2O   Psoriatic arthritis (HCC)     Past Surgical History:  Procedure Laterality Date   ATRIAL FLUTTER ABLATION  03/30/2014   ATRIAL FLUTTER ABLATION N/A 03/30/2014   Procedure: ATRIAL FLUTTER ABLATION;  Surgeon: Ellaree Gunther, MD;  Location: [facility] CATH LAB;  Service: Cardiovascular;  Laterality: N/A;   CARDIAC CATHETERIZATION  09/2003   Ejection fraction was estimated at 65%.   CARDIOVERSION N/A 03/15/2014   Procedure: CARDIOVERSION;  Surgeon: Lenise Quince, MD;  Location: [facility] ENDOSCOPY;  Service: Cardiovascular;  Laterality: N/A;   CARDIOVERSION N/A 07/17/2017   Procedure: CARDIOVERSION;  Surgeon: Jacqueline Matsu, MD;  Location: [facility] ENDOSCOPY;  Service: Cardiovascular;  Laterality: N/A;   CORONARY ARTERY BYPASS GRAFT  09/2003   Lima-lad,svg-diag,svg-om/distal LCX,svg-pda   TEE WITHOUT CARDIOVERSION N/A 03/15/2014   Procedure: TRANSESOPHAGEAL ECHOCARDIOGRAM (TEE);  Surgeon: Lenise Quince, MD;  Location: [facility] ENDOSCOPY;  Service: Cardiovascular;  Laterality: N/A;   TEE WITHOUT CARDIOVERSION N/A 07/17/2017   Procedure:  TRANSESOPHAGEAL ECHOCARDIOGRAM (TEE);  Surgeon: Jacqueline Matsu, MD;  Location: [facility] ENDOSCOPY;  Service: Cardiovascular;  Laterality: N/A;   TONSILLECTOMY  1950's     Current Outpatient Medications  Medication Sig Dispense Refill   acetaminophen  (TYLENOL ) 325 MG tablet Take 2 tablets (650 mg total) by mouth every 6 (six) hours as needed for up to 30 doses for mild pain or moderate pain. 30 tablet 0   allopurinol  (ZYLOPRIM ) 100 MG tablet TAKE 1 TABLET BY MOUTH ONCE DAILY FOR 2 WEEKS, 2 TABS DAILY FOR 2 WEEKS, THEN 3 TABS DAILY 270 tablet 1   apixaban  (ELIQUIS ) 5 MG TABS tablet Take 1 tablet (5 mg total) by mouth 2 (two) times daily. 60 tablet 5   atorvastatin  (LIPITOR) 80 MG tablet Take 1 tablet (80 mg total) by mouth daily. 90 tablet  3   cholecalciferol  (VITAMIN D) 1000 UNITS tablet Take 1,000 Units by mouth daily.     Coenzyme Q10 (CO Q 10 PO) Take 100 mg by mouth daily.      colchicine  0.6 MG tablet Take two at onset of gout and then one by mouth 2 times daily as needed 60 tablet 2   furosemide  (LASIX ) 20 MG tablet TAKE 1 TABLET BY MOUTH TWICE A DAY 180 tablet 3   losartan  (COZAAR ) 50 MG tablet TAKE 1 TABLET BY MOUTH EVERY DAY 90 tablet 2   metoprolol  succinate (TOPROL -XL) 50 MG 24 hr tablet Take 1 tablet by mouth daily.     multivitamin (THERAGRAN) per tablet Take 1 tablet by mouth daily.     nitroGLYCERIN  (NITROSTAT ) 0.4 MG SL tablet PLACE 1 TABLET UNDER THE TONGUE EVERY 5 MINUTES AS NEEDED FOR CHEST PAIN. 25 tablet 6   potassium chloride  SA (K-DUR,KLOR-CON ) 20 MEQ tablet Take 1 tablet (20 mEq total) by mouth daily. 30 tablet 6   SKYRIZI PEN 150 MG/ML pen      No current facility-administered medications for this visit.    Allergies:   Patient has no known allergies.    Social History:  The patient  reports that he has quit smoking. His smoking use included cigarettes. He has a 2.5 pack-year smoking history. He has never used smokeless tobacco. He reports current alcohol use of about  4.0 standard drinks of alcohol per week. He reports that he does not use drugs.   Family History:  The patient's family history includes Cancer in his mother; Heart disease in his mother; Stroke in his mother.    ROS:  Please see the history of present illness.   Otherwise, review of systems are positive for none.   All other systems are reviewed and negative.    PHYSICAL EXAM: VS:  There were no vitals taken for this visit. , BMI There is no height or weight on file to calculate BMI.  GENERAL:  Well appearing WM in NAD HEENT:  PERRL, EOMI, sclera are clear. Oropharynx is clear. NECK:  No jugular venous distention, carotid upstroke brisk and symmetric, soft left subclavian bruit, no thyromegaly or adenopathy LUNGS:  Clear to auscultation bilaterally CHEST:  Unremarkable HEART:  RRR,   PMI not displaced or sustained,S1 and S2 within normal limits, no S3, no S4: no clicks, no rubs, very soft 1/6 diastolic murmur. ABD:  Soft, nontender. BS +, no masses or bruits. No hepatomegaly, no splenomegaly EXT:  2 + pulses throughout, tr edema, chronic venous stasis changes. SKIN:  Warm and dry.  No rashes NEURO:  Alert and oriented x 3. Cranial nerves II through XII intact. PSYCH:  Cognitively intact   Recent Labs: No results found for requested labs within last 365 days.  Dated 05/08/17: normal CMET and CBC Dated 09/13/21: normal CBC, CMET, Mg Dated 03/13/23: normal TSH Dated 04/01/23: normal CBC and CMET. Mag 2.2.  Lipid Panel    Component Value Date/Time   CHOL 141 06/04/2023 1006   TRIG 54 06/04/2023 1006   HDL 64 06/04/2023 1006   CHOLHDL 2.2 06/04/2023 1006   CHOLHDL 2 03/06/2022 0909   VLDL 12.2 03/06/2022 0909   LDLCALC 65 06/04/2023 1006          Wt Readings from Last 3 Encounters:  11/25/23 209 lb 6.4 oz (95 kg)  09/19/23 207 lb 3.2 oz (94 kg)  07/31/23 204 lb (92.5 kg)     Other studies Reviewed: Additional  studies/ records that were reviewed today include: none.  ETT  03/06/17: Study Highlights     Blood pressure demonstrated a hypertensive response to exercise. Upsloping ST segment depression ST segment depression of 1 mm was noted during stress in the II, III, aVF, V4 and V5 leads, and returning to baseline after less than 1 minute of recovery.   1. Good exercise tolerance.  2. Upsloping ST depression in inferior leads and V4/V5.  This is nonspecific.    No evidence for ischemia.    Echo 07/08/17: Study Conclusions   - Left ventricle: The cavity size was normal. Wall thickness was   increased in a pattern of moderate LVH. Systolic function was   mildly to moderately reduced. The estimated ejection fraction was   in the range of 40% to 45%. - Aortic valve: There was moderate regurgitation. - Mitral valve: There was moderate regurgitation. - Left atrium: The atrium was moderately dilated. - Right atrium: The atrium was moderately dilated.  TEE 07/17/17: Study Conclusions   - Left ventricle: The estimated ejection fraction was in the range   of 40% to 45%. Mild diffuse hypokinesis with no identifiable   regional variations. No evidence of thrombus. - Aortic valve: There was moderate perivalvular regurgitation. - Mitral valve: No evidence of vegetation. There was moderate to   severe regurgitation, with multiple jets. Diastolic regurgitation   was absent. - Left atrium: The atrium was moderately dilated. No evidence of   thrombus in the atrial cavity or appendage. No evidence of   thrombus in the atrial cavity or appendage. Emptying velocity was   reduced. - Right atrium: The atrium was moderately dilated. - Atrial septum: There was a medium-sized fenestrated patent   foramen ovale. A in the fossa ovalis region was present. There   was a moderate bidirectional shunt through a patent foramen   ovale, in the baseline state. - Tricuspid valve: No evidence of vegetation. There was moderate   regurgitation. - Pulmonic valve: No evidence of  vegetation.   Impressions:   - Successful cardioversion. No cardiac source of emboli was   indentified.  Event monitor 07/25/17: Study Highlights   Atrial fibrillation Ventricular rates are frequently elevated Frequent nonsustained ventricular tachycardia as well as aberrantly conducted afib are note No sustained ventricular arrhythmias No prolonged pauses or AV block   Cardiac MRI 10/14/17: Cardiac MRI and thoracic MRA with and without contrast was performed on a 1.5 T MRI scanner to evaluate myocardial morphology, function, viability and assess pulmonary vein anatomy in a patient with hx of GERD, HTN, hypercholesterolemia, OSA on CPAP, CAD s/p CABG (LIMA to LAD, svg to D1, sequential svg to OM and distal LCx, and svg to PDA), atrial flutter s/p ablation, atrial fibrillation, and wide complex tachycardia thought to due to SVT with aberrancy.  Transthoracic echo demonstrated an LVEF of 40-45%, normal cavity size, moderate MR, and moderate AR.  Transesophageal echo demonstrated moderate aortic regurgitation, moderate-severe mitral regurgitation, and a PFO.  Nuclear stress demonstrated a mildly dilated LV with no perfusion defect and an EF of 44%.  The patient is scheduled for ablation for atrial fibrillation.  Cardiac MRI 1. The left ventricle is moderately dilated.  Wall thickness is normal.  Regional and global LV systolic function are low normal.  The LVEF is calculated at 59%.  2. The right ventricle is normal in cavity size and wall thickness.  Global RV systolic function is low normal to mildly reduced.  The RVEF is calculated at  50%.  3. Both atria are severely enlarged.  4. The aortic valve is trileaflet in morphology. There is no significant aortic stenosis. There is moderate-severe aortic regurgitation.  The regurgitant orifice area measures 0.26 cm^2.  There is reversal of flow in the proximal descending thoracic aorta.  5.  There is  mild-moderate mitral regurgitation.   There is moderate tricuspid regurgitation.  There is mild pulmonic regurgitation.  6. Delayed enhancement imaging demonstrates no evidence of myocardial infarction, scarring,  or infiltration.  7.  There is no evidence of an intracardiac thrombus.  8.  The pericardium is normal in thickness.  There is no significant pericardial effusion.  Thoracic MRA 1. There are 3 right sided pulmonary veins.  The right lower and right middle pulmonary veins enter the LA immediately adjacent to one another. The right middle vein is small.  The 2 left sided pulmonary veins enter the LA normally.  All veins are patent without significant stenosis proximally.   MRA bi-orthogonal luminal dimensions are listed below: RUPV: 2.0 x 1.8 cm, 76 cm/sec RMPV: 0.6 x 0.5 cm RLPV: 2.0 x 1.7 cm, 60 cm/ sec LUPV: 1.9 x 1.0 cm, 59 cm/ sec LLPV: 1.9 x 0.9 cm, 60 cm/ sec  MRA LA dimensions Head-foot: 7.1 cm Right-left:  6.2 cm Anterior-posterior:  4.3 cm  2. The thoracic aorta is normal in diameter. There is no evidence of a dissection flap.  3. The main and proximal branch pulmonary arteries are normal in size  4. Systemic venous connections are normal.  5. The esophagus is posterior of the left atrium in close proximity to the ostia of the left-sided pulmonary veins.  The descending thoracic aorta is behind the esophagus and behind the left sided pulmonary Veins.  Echo 10/16/17: INTERPRETATION ---------------------------------------------------------------   NORMAL LEFT VENTRICULAR SYSTOLIC FUNCTION WITH MILD LVH   NORMAL LA PRESSURES WITH DIASTOLIC DYSFUNCTION   NORMAL RIGHT VENTRICULAR SYSTOLIC FUNCTION   VALVULAR REGURGITATION: MODERATE AR, MILD MR, TRIVIAL PR, MILD TR   NO VALVULAR STENOSIS   IRREGULAR RHYTHM   Moderate to severe aortic regurgitation. Mild left ventricular enlargement   suggests color Doppler may underestimate aortic regurgitation severity.   AR VENA CONTRACTA=0.5CM   MILD TO  MODERATE TR   NO PRIOR STUDY FOR COMPARISON  Echo 05/02/18: Study Conclusions   - Left ventricle: The cavity size was mildly dilated. Wall   thickness was increased in a pattern of mild LVH. Systolic   function was normal. The estimated ejection fraction was in the   range of 50% to 55%. Wall motion was normal; there were no   regional wall motion abnormalities. Left ventricular diastolic   function parameters were normal. - Aortic valve: There was mild to moderate regurgitation. - Mitral valve: Calcified annulus. There was mild regurgitation. - Left atrium: The atrium was severely dilated. - Tricuspid valve: There was moderate regurgitation. - Pulmonary arteries: Systolic pressure was mildly increased. PA   peak pressure: 41 mm Hg (S).   Impressions:   - Normal LV systolic function; mild LVE and LVH; mild to moderate   AI; mild MR; severe LAE; moderate TR; mild pulmonary   hypertension.  Echo 03/26/19: INTERPRETATION ---------------------------------------------------------------   NORMAL LEFT VENTRICULAR SYSTOLIC FUNCTION   NORMAL LA PRESSURES WITH DIASTOLIC DYSFUNCTION   NORMAL RIGHT VENTRICULAR SYSTOLIC FUNCTION   VALVULAR REGURGITATION: MODERATE AR, MILD MR, TRIVIAL PR, MILD TR   NO VALVULAR STENOSIS   AR VENA CONTRACTA=0.6CM   MILD TO MODERATE TR   NO  PRIOR STUDY FOR COMPARISON   Echo 04/11/20: ECHOCARDIOGRAPHIC DESCRIPTIONS -----------------------------------------------  AORTIC ROOT          Size: MILDLY DILATED    Dissection: INDETERM FOR DISSECTION   AORTIC VALVE      Leaflets: Tricuspid             Morphology: MILDLY THICKENED      Mobility: Fully Mobile   LEFT VENTRICLE                                      Anterior: Normal          Size: MILDLY ENLARGED                        Lateral: Normal   Contraction: Normal                                  Septal: Normal    Closest EF: >55%(Estimated)  Calc.EF: 56% (3D)      Apical: Normal     LV masses: No Masses                              Inferior: Normal           LVH: MILD LVH                             Posterior: Normal   LV GLS(LOL): -18.0%   LV GLS(AVG): -16.6% Normal Range [ <= -16]  Dias.FxClass: N/A   MITRAL VALVE      Leaflets: Normal                  Mobility: Fully mobile    Morphology: Normal   LEFT ATRIUM          Size: MODERATELY ENLARGED     LA masses: No masses                Normal IAS   MAIN PA          Size: DILATED   PULMONIC VALVE    Morphology: Normal      Mobility: Fully Mobile   RIGHT VENTRICLE          Size: Normal                    Free wall: Normal   Contraction: Normal                    RV masses: No Masses         TAPSE:   2.1 cm,  Normal Range [>= 1.6 cm]       RV Note: ANNULAR VELOCITY=10cm/s   TRICUSPID VALVE      Leaflets: Normal                  Mobility: Fully mobile    Morphology: Normal   RIGHT ATRIUM          Size: MODERATELY ENLARGED        RA Other: None     RA masses: No masses   PERICARDIUM         Fluid: No effusion   INFERIOR VENACAVA  Size: DILATED    Normal respiratory collapse   DOPPLER ECHO and OTHER SPECIAL PROCEDURES ------------------------------------     Aortic: MODERATE AR            No AS      Mitral: MILD MR                No MS     MV Inflow E Vel.= 70.0 cm/s  MV Annulus E'Vel.= 8.5 cm/s  E/E'Ratio= 8   Tricuspid: MILD TR                No TS             2.9 m/s peak TR vel   41 mmHg peak RV pressure   Pulmonary: TRIVIAL PR             No PS       Other:   INTERPRETATION ---------------------------------------------------------------    NORMAL LEFT VENTRICULAR SYSTOLIC FUNCTION WITH MILD LVH    NORMAL RIGHT VENTRICULAR SYSTOLIC FUNCTION    VALVULAR REGURGITATION: MODERATE AR, MILD MR, TRIVIAL PR, MILD TR    NO VALVULAR STENOSIS    MILDLY DILATED LEFT VENTRICLE SUGGESTS POSSIBILITY THAT ECHOCARDIOGRAPHY    UNDERESTIMATES AORTIC REGURGITATION SEVERITY.    AR VENA CONTRACTA=0.6cm    3D acquisition and  reconstructions were performed as part of this    examination to more accurately quantify the effects of moderate or greater    valve regurgitation. (post-processing on an Independent workstation).     Compared with prior Echo study on 03/26/2019: NO SIGNIFICANT CHANGES.    Event monitor 12/07/20: Study Highlights  Atrial fibrillation with RVR and intermittent aberrancy. Patient had returned to NSR on 12/01/20    Echo 07/04/21: INTERPRETATION ---------------------------------------------------------------    NORMAL LEFT VENTRICULAR SYSTOLIC FUNCTION WITH MILD LVH    NORMAL RIGHT VENTRICULAR SYSTOLIC FUNCTION    VALVULAR REGURGITATION: MODERATE AR, MILD MR, MILD PR, MILD TR    NO VALVULAR STENOSIS    IRREGULAR RHYTHM THROUGHOUT EXAM    AR VENA CONTRACTA = 0.5 cm    3D acquisition and reconstructions were performed as part of this    examination to more accurately quantify the effects of identified    structural abnormalities as part of the exam. (post-processing on an    Independent workstation).     Compared with prior Echo study on 04/11/2020: MILD TO MODERATE TR    SEVERE BIATRIAL ENLARGEMENT. INCREASED RV DIMENSIONS    NO SIGNIFICANT CHANGE IN AR OR LV FUNCTION    Cardiac MRI 09/11/21: SUMMARY   ==========================================================================================================   Cardiac MRI and thoracic MRA were performed  with and without contrast on a 3.0 T MRI scanner to evaluate  myocardial morphology, function, viability and pulmonary vein anatomy in a 73 y/o patient with hx of GERD,  HTN, hypercholesterolemia, OSA on CPAP, CAD s/p CABG (LIMA to LAD, svg to D1, sequential svg to OM and  distal LCx, and svg to PDA), atrial flutter s/p ablation, PFO, atrial fibrillation, wide complex  tachycardia thought to due to SVT with aberrancy, aortic regurgitation, and mitral regurgitation who is  scheduled for afib ablation.  Comparisons were made to the prior  scan from 10/14/2017.   The cardiac rhythm was atrial fibrillation. Real time imaging was used.  Regurgitant valvular lesions could  not be fully assessed due to the use of real time imaging.  Comprehensive assessment could be performed  following conversion to NSR.   Cardiac MRI  1. The  left ventricle is moderately dilated.  Wall thickness is normal.  There are no regional wall motion  abnormalities. During atrial fibrillation, there is low normal to mildly reduced function with an LVEF  visually estimated at 50-55%.   2. The right ventricle is normal in cavity size and wall thickness.  Global RV systolic function is low  normal to mildly reduced.   3. Both atria are severely enlarged.   4. The aortic valve is trileaflet in morphology. There is no significant aortic stenosis. There is at least  moderate-severe aortic regurgitation.   5.  There is at least mild-moderate mitral regurgitation.  There is at least moderate tricuspid  regurgitation. There is mild pulmonic regurgitation.   6. Delayed enhancement imaging demonstrates no evidence of myocardial infarction, scarring,  or  infiltration.   7.  There is no evidence of an intracardiac thrombus.   8.  The pericardium is normal in thickness.  There is no significant pericardial effusion.   Thoracic MRA:  1. There are 3 right sided pulmonary veins.  The right lower and right middle pulmonary veins enter the LA  immediately adjacent to one another. The right middle vein is small.   The 2 left sided pulmonary veins enter the LA normally.   All veins are patent without significant stenosis proximally.   MRA bi-orthogonal luminal dimensions are listed below:  RUPV: 2.1 x 1.9 cm (prior 2.0 x 1.8 cm, 76 cm/sec)  RMPV: 0.7 x 0.6 cm (prior 0.6 x 0.5 cm)  RLPV: 2.1 x 1.7 cm (prior 2.0 x 1.7 cm, 60 cm/ sec)  LUPV: 1.9 x 1.1 cm (prior 1.9 x 1.0 cm, 59 cm/ sec)  LLPV: 1.7 x 1.0 cm (prior 1.9 x 0.9 cm, 60 cm/ sec)   MRA LA dimensions   Head-foot: 6.9 cm  Right-left: 6.8 cm  Anterior-posterior: 4.5 cm   2. The thoracic aorta is normal in diameter. There is no evidence of a dissection flap.   3. The main and proximal branch pulmonary arteries are normal in size   4. Systemic venous connections are normal.   5. The esophagus traverses posterior to ostia of the left-sided pulmonary veins.  The descending thoracic  aorta is behind the esophagus and behind the left sided pulmonary veins.   Echo 07/26/22:   INTERPRETATION ---------------------------------------------------------------    NORMAL LEFT VENTRICULAR SYSTOLIC FUNCTION WITH MILD LVH    NORMAL RIGHT VENTRICULAR SYSTOLIC FUNCTION    VALVULAR REGURGITATION: MODERATE AR, MILD MR, TRIVIAL PR, MILD TR    NO VALVULAR STENOSIS     Compared with prior Echo study on 07/04/2021: MILD TO MODERATE TR. NO    SIGNIFICANT CHANGE IN AR OR LV SIZE/FUNCTION.   Echo 08/08/23: CONCLUSION -------------------------------------------------------------------------------  MILD LEFT VENTRICULAR SYSTOLIC DYSFUNCTION WITH NO LVH  ESTIMATED EF: 45%, CALC EF(2D): 48%, CALC EF(3D): 47%  DIASTOLIC FUNCTION CAN'T BE DETERMINED  MILD RIGHT VENTRICULAR SYSTOLIC DYSFUNCTION  VALVULAR REGURGITATION: MILD AR, MODERATE MR, TRIVIAL PR, MILD TR  NO VALVULAR STENOSIS  PHYSICIAN IMPRESSIONS --------------------------------------------------------------------  MILD TO MODERATE AORTIC REGURGITATION  MILD TO MODERATE  MITRAL REGURGITAITON  MILD TO MODERATE TRICUSPID REGURGITATION  ESTIMATED LVEF 45%, ARRHTYMIA LIMITS ACCURACY OF LVEF ASSESSMENT  ESTIMATED RVSP =    Compared with prior Echo study on   07/26/2022 : ARRHYTHMIA SEEN ON TODAY'S EXAM  REDUCED LV AND RV FUNCITON  INCREASE IN BIATRIAL DIMENSIONS     ASSESSMENT AND PLAN:  1. Atrial fibriillation with RVR. Prior history of atrial flutter and  atrial tachycardia ablation in 2015. On anticoagulation with Eliquis  5 mg bid. [place] vasc score  of 3. On  Toprol  XL.   Now s/p repeat Afib ablation at [facility] on 10/15/17 and again in Feb 2023. Recurrent Afib in August. Underwent DCCV then repeat in September and again in  January. S/p repeat ablation on Jan 20. Not felt to be a candidate for Dofetilide due to prolonged QT. Consider amiodarone for recurrence  2. Coronary disease status post CABG in 2005. He is without chest pain. He had a normal Myoview  study September 2015. Low risk ETT in August 2018. Repeat nuclear  study at [facility] 2019 showed no ischemia. EF 44%.   3. Chronic Aortic insufficiency- moderate to severe.  Pulse pressure normal. No symptoms. Repeat Echo in Jan at [facility] showed no change. Moderate AI. Normal LV size and function. MRI stable from 2019.  4. 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   5. Atrial flutter s/p ablation.   6. OSA- now on CPAP. Followed by Dr. Micael Adas.  7. Hyperlipidemia on statin. Needs updated lipid panel - will check today  8. HTN - BP is under good control  9. Left subclavian stenosis. Asymptomatic.     Disposition: FU with me in 6 months  Signed, Mesha Schamberger [place], MD  11/28/2023 12:15 PM    [facility]

## 2023-12-02 ENCOUNTER — Ambulatory Visit: Payer: Medicare HMO | Admitting: Cardiology

## 2023-12-24 ENCOUNTER — Ambulatory Visit (INDEPENDENT_AMBULATORY_CARE_PROVIDER_SITE_OTHER): Admitting: Family Medicine

## 2023-12-24 ENCOUNTER — Encounter: Payer: Self-pay | Admitting: Family Medicine

## 2023-12-24 VITALS — BP 112/64 | HR 76 | Temp 97.7°F | Wt 204.3 lb

## 2023-12-24 DIAGNOSIS — J069 Acute upper respiratory infection, unspecified: Secondary | ICD-10-CM

## 2023-12-24 DIAGNOSIS — K219 Gastro-esophageal reflux disease without esophagitis: Secondary | ICD-10-CM | POA: Diagnosis not present

## 2023-12-24 DIAGNOSIS — Z0184 Encounter for antibody response examination: Secondary | ICD-10-CM | POA: Diagnosis not present

## 2023-12-24 DIAGNOSIS — N529 Male erectile dysfunction, unspecified: Secondary | ICD-10-CM

## 2023-12-24 MED ORDER — SILDENAFIL CITRATE 100 MG PO TABS: 100.0000 mg | ORAL_TABLET | Freq: Every day | ORAL | 5 refills | Status: AC | PRN

## 2023-12-24 NOTE — Progress Notes (Signed)
 Established Patient Office Visit  Subjective   Patient ID: [name] [name], male    DOB: 1950-11-11  Age: 73 y.o. MRN: [id number]  Chief Complaint  Patient presents with   Cough   Sinusitis    HPI   [name] [name] is here to discuss several items.  First, he developed upper respiratory symptoms couple weeks ago.  He got just his second dose of Skyrizi about 3 weeks ago.  About a week later developed some sinus congestion and cough.  No significant fever.  Has continued to go to the gym.  Feels overall better at this time but still has some cough.  He was concerned because of recent initiation of Skyrizi whether this may be related side effect.  He also plans to travel to [place] into the summer.  He had questions regarding MMR status.  We explained that individuals born before 1959 generally have good natural immunity but that we cannot know for sure.  He is specifically interested in getting lab work to determine immune status in terms of antibodies.  He has recently had some increased GERD symptoms.  Currently not taking any antiacid therapies.  Hx of erectile dysfunction.  Requesting refills of Viagra .   Past Medical History:  Diagnosis Date   Chicken pox    Coronary artery disease    Severe three-vessel coronary artery disease with ejection fraction of 65%   GERD (gastroesophageal reflux disease)    History of gout    History of psoriasis    Hypercholesterolemia    Hypertension    OSA (obstructive sleep apnea)    Moderate with AHI of 16.9/hr now on CPAP at 10cm H2O   Psoriatic arthritis (HCC)    Past Surgical History:  Procedure Laterality Date   ATRIAL FLUTTER ABLATION  03/30/2014   ATRIAL FLUTTER ABLATION N/A 03/30/2014   Procedure: ATRIAL FLUTTER ABLATION;  Surgeon: Ellaree Gunther, MD;  Location: [facility] CATH LAB;  Service: Cardiovascular;  Laterality: N/A;   CARDIAC CATHETERIZATION  09/2003   Ejection fraction was estimated at 65%.   CARDIOVERSION N/A 03/15/2014   Procedure:  CARDIOVERSION;  Surgeon: Lenise Quince, MD;  Location: [facility] ENDOSCOPY;  Service: Cardiovascular;  Laterality: N/A;   CARDIOVERSION N/A 07/17/2017   Procedure: CARDIOVERSION;  Surgeon: Jacqueline Matsu, MD;  Location: [facility] ENDOSCOPY;  Service: Cardiovascular;  Laterality: N/A;   CORONARY ARTERY BYPASS GRAFT  09/2003   [place]ima-lad,svg-diag,svg-om/distal LCX,svg-pda   TEE WITHOUT CARDIOVERSION N/A 03/15/2014   Procedure: TRANSESOPHAGEAL ECHOCARDIOGRAM (TEE);  Surgeon: Lenise Quince, MD;  Location: [facility] ENDOSCOPY;  Service: Cardiovascular;  Laterality: N/A;   TEE WITHOUT CARDIOVERSION N/A 07/17/2017   Procedure: TRANSESOPHAGEAL ECHOCARDIOGRAM (TEE);  Surgeon: Jacqueline Matsu, MD;  Location: [facility] ENDOSCOPY;  Service: Cardiovascular;  Laterality: N/A;   TONSILLECTOMY  1950's    reports that he has quit smoking. His smoking use included cigarettes. He has a 2.5 pack-year smoking history. He has never used smokeless tobacco. He reports current alcohol use of about 4.0 standard drinks of alcohol per week. He reports that he does not use drugs. family history includes Cancer in his mother; Heart disease in his mother; Stroke in his mother. No Known Allergies  Review of Systems  Constitutional:  Negative for chills and fever.  HENT:  Negative for sore throat.   Respiratory:  Positive for cough.   Cardiovascular:  Negative for chest pain.  Gastrointestinal:  Negative for abdominal pain.      Objective:     BP 112/64 (  BP Location: Left Arm, Patient Position: Sitting, Cuff Size: Normal)   Pulse 76   Temp 97.7 F (36.5 C) (Oral)   Wt 204 lb 4.8 oz (92.7 kg)   SpO2 96%   BMI 26.95 kg/m  BP Readings from Last 3 Encounters:  12/24/23 112/64  11/25/23 (!) 126/58  07/31/23 (!) 142/60   Wt Readings from Last 3 Encounters:  12/24/23 204 lb 4.8 oz (92.7 kg)  11/25/23 209 lb 6.4 oz (95 kg)  09/19/23 207 lb 3.2 oz (94 kg)      Physical Exam Vitals reviewed.  Constitutional:      General: He is not  in acute distress.    Appearance: He is not ill-appearing.  Cardiovascular:     Rate and Rhythm: Normal rate and regular rhythm.  Pulmonary:     Effort: Pulmonary effort is normal.     Breath sounds: Normal breath sounds. No wheezing or rales.  Musculoskeletal:     Cervical back: Neck supple.     Right lower leg: No edema.     Left lower leg: No edema.  Lymphadenopathy:     Cervical: No cervical adenopathy.  Neurological:     Mental Status: He is alert.      No results found for any visits on 12/24/23.    The ASCVD Risk score (Arnett DK, et al., 2019) failed to calculate for the following reasons:   Risk score cannot be calculated because patient has a medical history suggesting prior/existing ASCVD    Assessment & Plan:   #1 upper respiratory infection.  Suspect viral.  Nonfocal lung exam.  Denies any fever .  Overall feels better.  Continue to observe for now.  Consider over-the-counter cough medication as needed  #2 immunity status testing.  Patient concerned regarding MMR status.  We explained that we do not routinely test individuals born prior to 1959 but he would like to consider antibody testing.  MMR immunity status panel ordered We did explain that because he is on Skyrizi would not advise MMR vaccine if this were to come back negative  #3 recent increased GERD symptoms.  Start with over-the-counter Pepcid 20 mg twice daily.  If not getting adequate relief be in touch and we can consider possible PPI  #4 Erectile dysfunction.  Pt requesting refill of Viagra .  He knows this cannot be mixed with nitroglycerin .    No follow-ups on file.    Glean Lamy, MD

## 2023-12-24 NOTE — Patient Instructions (Signed)
Follow up immediately for any fever or increased shortness of breath. 

## 2023-12-26 ENCOUNTER — Other Ambulatory Visit

## 2023-12-26 DIAGNOSIS — Z0184 Encounter for antibody response examination: Secondary | ICD-10-CM | POA: Diagnosis not present

## 2023-12-27 LAB — MEASLES/MUMPS/RUBELLA IMMUNITY
Mumps IgG: 199 [AU]/ml
Rubella: 19.3 {index}
Rubeola IgG: >300 [AU]/ml

## 2023-12-28 ENCOUNTER — Ambulatory Visit: Payer: Self-pay | Admitting: Family Medicine

## 2024-01-01 DIAGNOSIS — G4733 Obstructive sleep apnea (adult) (pediatric): Secondary | ICD-10-CM | POA: Diagnosis not present

## 2024-01-15 ENCOUNTER — Other Ambulatory Visit: Payer: Self-pay | Admitting: Cardiology

## 2024-01-17 NOTE — Progress Notes (Unsigned)
 Cardiology Office Note   Date:  01/23/2024   ID:  [name] [name], [name] [name] 1950/10/30, MRN [id number]  PCP:  Micheal Wolm ORN, MD  Cardiologist:   Wofford Stratton [place], MD   Chief Complaint  Patient presents with   Atrial Fibrillation       History of Present Illness: [name] [name] is a 73 y.o. male who is seen for follow up Afib, CAD, AI, and CHF. He has a history of  atrial flutter and CAD. He is status post CABG in 2005. He had a normal stress echo in May of 2013. In August 2015 he presented with atrial flutter with RVR. He had mildly elevated troponins. He had successful TEE guided DCCV. He then underwent ablation of atrial flutter and ectopic atrial tachycardia on 03/30/14 by Dr. Kelsie.   Stress Myoview  September 2015 demonstrated excellent exercise tolerance and normal perfusion. TEE showed normal LV function, mild biatrial enlargement, mild MR,TR,AI. He does have moderate obstructive sleep apnea followed by Dr. Shlomo and is on CPAP therapy.  He was seen in December 2018 with  an irregular and fast pulse. Was found to be in AFib with RVR. Echo showed EF 40-45% with moderate MR and AI and moderate biatrial enlargement. Toprol  dose was increased and he was started on Eliquis . Underwent TEE guided DCCV on 07/17/17. Echo showed EF 40% with moderate biatrial enlargement. Moderate to severe MR, moderate TR, patent foramen ovale. He was successfully cardioverted but had recurrence of Afib on 07/24/17. He was seen in Afib clinic with Dr. Kelsie for consideration of Afib ablation versus AAD therapy. There was discussion about starting him on Tikosyn. Given new LV dysfunction and valvular disease it was felt that right and left heart cath were indicated prior to initiation of AAD therapy.  He also wore a monitor that showed episodes of wide complex tachycardia and it was unclear whether this was aberrancy versus VT.  He got a  second opinion at [facility] with Dr. Melchor. He was admitted in February 2019 and  had a nuclear stress test showing no ischemia. EF 44%. He was loaded with Tikosyn and DCCV was performed. he had significant QT prolongation and Tikosyn was discontinued. He later underwent Cardiac MRI. He underwent AFib ablation with pulmonary vein isolation on 10/15/17. Echo following ablation showed EF 50-55%.  Both MRI and Echo showed evidence of moderate to severe AI. LV function had returned to normal with restoration of NSR.  In June 2020 he had recurrent Afib and underwent DCCV at [facility]. On follow up he was noted to be bradycardic and metoprolol  dose was reduced. In September 2020 he had an Echo at [facility] showing normal LV function and moderate AI. He is followed regularly by Dr Cesario at [facility]. Seen there in August. Echo done showing moderate AI. Normal LV function.   He presented to the ER on November 16, 2020 with fast heart beat: SVT vs fib/flutter.  Adenosine  given with short break to 10 beats of VT, then back to SVT with RBBB. DCCV was about to be performed, but he converted on his own when they sat the patient up. He was discharged without admission. He sent monitor strips from 4/28 with Afib in the 170s. BB was increased. Strips were faxed to his [facility] cardiologist. Had first occurrence of Afib RVR on March 30 - HR 190s. He converted to NSR. Clemens and broke ribs on April 27th. Three dogs jumped on him and he fell and broke 4 ribs on  right side (3-6). No head injury, no LOC. Monitor showed Afib 4/28-5/7 - could be related to his rib injury. On subsequent follow up in the office had no recurrence of Afib.    He underwent redo AFib and Atrial tachycardia ablation by Dr Melchor at [facility] on 09/12/21. Prior to this he had cardiac MRI showing mod-severe AI, mild LV enlargement - really unchanged from 2019.  Echo in Dec 2022 showed normal LV function and moderate AI.   He was seen by [facility] EP in April  and doing well in NSR. His metoprolol  dose was reduced to 50 mg daily.  Has seen Neurology (Dr Tat) for  evaluation of mild Parkinson's symptoms.   In August he had recurrent AFib and underwent DCCV at [facility]. He had early recurrence and was admitted in September for  repeat DCCV. QT felt to be too long for Dofetilide. Consideration for amiodarone vs ranolazine for recurrence.  He notes when in AFib his HR is persistently high 160 range and he feels tired. No chest pain or SOB. No edema.   In January he had recurrent AFib. Had unsuccessful DCCV. He had a repeat DCCV. On Kardiomobile he states that he is maintaining NSR. He had repeat Echo showing EF 45%. Mild to Moderate MR, TR and AI. Biatrial enlargement. Echo was done while in Afib.   He feels very well. No palpitations. Exercising regularly without chest pain or dyspnea. Only gets SOB when in Afib. Now on Skyrizi for psoriasis.    Past Medical History:  Diagnosis Date   Chicken pox    Coronary artery disease    Severe three-vessel coronary artery disease with ejection fraction of 65%   GERD (gastroesophageal reflux disease)    History of gout    History of psoriasis    Hypercholesterolemia    Hypertension    OSA (obstructive sleep apnea)    Moderate with AHI of 16.9/hr now on CPAP at 10cm H2O   Psoriatic arthritis (HCC)     Past Surgical History:  Procedure Laterality Date   ATRIAL FLUTTER ABLATION  03/30/2014   ATRIAL FLUTTER ABLATION N/A 03/30/2014   Procedure: ATRIAL FLUTTER ABLATION;  Surgeon: Lynwood JONETTA Rakers, MD;  Location: [facility] CATH LAB;  Service: Cardiovascular;  Laterality: N/A;   CARDIAC CATHETERIZATION  09/2003   Ejection fraction was estimated at 65%.   CARDIOVERSION N/A 03/15/2014   Procedure: CARDIOVERSION;  Surgeon: Redell GORMAN Shallow, MD;  Location: [facility] ENDOSCOPY;  Service: Cardiovascular;  Laterality: N/A;   CARDIOVERSION N/A 07/17/2017   Procedure: CARDIOVERSION;  Surgeon: Shlomo Wilbert SAUNDERS, MD;  Location: [facility] ENDOSCOPY;  Service: Cardiovascular;  Laterality: N/A;   CORONARY ARTERY BYPASS GRAFT  09/2003    Lima-lad,svg-diag,svg-om/distal LCX,svg-pda   TEE WITHOUT CARDIOVERSION N/A 03/15/2014   Procedure: TRANSESOPHAGEAL ECHOCARDIOGRAM (TEE);  Surgeon: Redell GORMAN Shallow, MD;  Location: [facility] ENDOSCOPY;  Service: Cardiovascular;  Laterality: N/A;   TEE WITHOUT CARDIOVERSION N/A 07/17/2017   Procedure: TRANSESOPHAGEAL ECHOCARDIOGRAM (TEE);  Surgeon: Shlomo Wilbert SAUNDERS, MD;  Location: [facility] ENDOSCOPY;  Service: Cardiovascular;  Laterality: N/A;   TONSILLECTOMY  1950's     Current Outpatient Medications  Medication Sig Dispense Refill   acetaminophen  (TYLENOL ) 325 MG tablet Take 2 tablets (650 mg total) by mouth every 6 (six) hours as needed for up to 30 doses for mild pain or moderate pain. 30 tablet 0   apixaban  (ELIQUIS ) 5 MG TABS tablet Take 1 tablet (5 mg total) by mouth 2 (two) times daily. 60 tablet 5  atorvastatin  (LIPITOR) 80 MG tablet TAKE 1 TABLET BY MOUTH EVERY DAY 90 tablet 3   cholecalciferol  (VITAMIN D) 1000 UNITS tablet Take 1,000 Units by mouth daily.     Coenzyme Q10 (CO Q 10 PO) Take 100 mg by mouth daily.      colchicine  0.6 MG tablet TAKE TWO AT ONSET OF GOUT AND THEN ONE BY MOUTH 2 TIMES DAILY AS NEEDED 180 tablet 0   furosemide  (LASIX ) 20 MG tablet TAKE 1 TABLET BY MOUTH TWICE A DAY 180 tablet 0   losartan  (COZAAR ) 50 MG tablet TAKE 1 TABLET BY MOUTH EVERY DAY 90 tablet 2   metoprolol  succinate (TOPROL -XL) 50 MG 24 hr tablet Take 1 tablet by mouth daily.     multivitamin (THERAGRAN) per tablet Take 1 tablet by mouth daily.     nitroGLYCERIN  (NITROSTAT ) 0.4 MG SL tablet PLACE 1 TABLET UNDER THE TONGUE EVERY 5 MINUTES AS NEEDED FOR CHEST PAIN. 25 tablet 6   potassium chloride  SA (K-DUR,KLOR-CON ) 20 MEQ tablet Take 1 tablet (20 mEq total) by mouth daily. 30 tablet 6   sildenafil  (VIAGRA ) 100 MG tablet Take 1 tablet (100 mg total) by mouth daily as needed for erectile dysfunction. 10 tablet 5   SKYRIZI PEN 150 MG/ML pen      No current facility-administered medications for this visit.     Allergies:   Patient has no known allergies.    Social History:  The patient  reports that he has quit smoking. His smoking use included cigarettes. He has a 2.5 pack-year smoking history. He has never used smokeless tobacco. He reports current alcohol use of about 4.0 standard drinks of alcohol per week. He reports that he does not use drugs.   Family History:  The patient's family history includes Cancer in his mother; Heart disease in his mother; Stroke in his mother.    ROS:  Please see the history of present illness.   Otherwise, review of systems are positive for none.   All other systems are reviewed and negative.    PHYSICAL EXAM: VS:  BP 120/77 (BP Location: Left Arm, Patient Position: Sitting)   Pulse 60   Ht 6' 1 (1.854 m)   Wt 204 lb 3.2 oz (92.6 kg)   SpO2 95%   BMI 26.94 kg/m  , BMI Body mass index is 26.94 kg/m.  GENERAL:  Well appearing WM in NAD HEENT:  PERRL, EOMI, sclera are clear. Oropharynx is clear. NECK:  No jugular venous distention, carotid upstroke brisk and symmetric, soft left subclavian bruit, no thyromegaly or adenopathy LUNGS:  Clear to auscultation bilaterally CHEST:  Unremarkable HEART:  RRR,   PMI not displaced or sustained,S1 and S2 within normal limits, no S3, no S4: no clicks, no rubs, very soft 1/6 diastolic murmur. ABD:  Soft, nontender. BS +, no masses or bruits. No hepatomegaly, no splenomegaly EXT:  2 + pulses throughout, tr edema, chronic venous stasis changes. SKIN:  Warm and dry.  No rashes NEURO:  Alert and oriented x 3. Cranial nerves II through XII intact. PSYCH:  Cognitively intact   Recent Labs: No results found for requested labs within last 365 days.  Dated 05/08/17: normal CMET and CBC Dated 09/13/21: normal CBC, CMET, Mg Dated 03/13/23: normal TSH Dated 04/01/23: normal CBC and CMET. Mag 2.2.  Lipid Panel    Component Value Date/Time   CHOL 141 06/04/2023 1006   TRIG 54 06/04/2023 1006   HDL 64 06/04/2023 1006    CHOLHDL 2.2  06/04/2023 1006   CHOLHDL 2 03/06/2022 0909   VLDL 12.2 03/06/2022 0909   LDLCALC 65 06/04/2023 1006          Wt Readings from Last 3 Encounters:  01/23/24 204 lb 3.2 oz (92.6 kg)  12/24/23 204 lb 4.8 oz (92.7 kg)  11/25/23 209 lb 6.4 oz (95 kg)     Other studies Reviewed: Additional studies/ records that were reviewed today include: none.  ETT 03/06/17: Study Highlights     Blood pressure demonstrated a hypertensive response to exercise. Upsloping ST segment depression ST segment depression of 1 mm was noted during stress in the II, III, aVF, V4 and V5 leads, and returning to baseline after less than 1 minute of recovery.   1. Good exercise tolerance.  2. Upsloping ST depression in inferior leads and V4/V5.  This is nonspecific.    No evidence for ischemia.    Echo 07/08/17: Study Conclusions   - Left ventricle: The cavity size was normal. Wall thickness was   increased in a pattern of moderate LVH. Systolic function was   mildly to moderately reduced. The estimated ejection fraction was   in the range of 40% to 45%. - Aortic valve: There was moderate regurgitation. - Mitral valve: There was moderate regurgitation. - Left atrium: The atrium was moderately dilated. - Right atrium: The atrium was moderately dilated.  TEE 07/17/17: Study Conclusions   - Left ventricle: The estimated ejection fraction was in the range   of 40% to 45%. Mild diffuse hypokinesis with no identifiable   regional variations. No evidence of thrombus. - Aortic valve: There was moderate perivalvular regurgitation. - Mitral valve: No evidence of vegetation. There was moderate to   severe regurgitation, with multiple jets. Diastolic regurgitation   was absent. - Left atrium: The atrium was moderately dilated. No evidence of   thrombus in the atrial cavity or appendage. No evidence of   thrombus in the atrial cavity or appendage. Emptying velocity was   reduced. - Right atrium: The  atrium was moderately dilated. - Atrial septum: There was a medium-sized fenestrated patent   foramen ovale. A in the fossa ovalis region was present. There   was a moderate bidirectional shunt through a patent foramen   ovale, in the baseline state. - Tricuspid valve: No evidence of vegetation. There was moderate   regurgitation. - Pulmonic valve: No evidence of vegetation.   Impressions:   - Successful cardioversion. No cardiac source of emboli was   indentified.  Event monitor 07/25/17: Study Highlights   Atrial fibrillation Ventricular rates are frequently elevated Frequent nonsustained ventricular tachycardia as well as aberrantly conducted afib are note No sustained ventricular arrhythmias No prolonged pauses or AV block   Cardiac MRI 10/14/17: Cardiac MRI and thoracic MRA with and without contrast was performed on a 1.5 T MRI scanner to evaluate myocardial morphology, function, viability and assess pulmonary vein anatomy in a patient with hx of GERD, HTN, hypercholesterolemia, OSA on CPAP, CAD s/p CABG (LIMA to LAD, svg to D1, sequential svg to OM and distal LCx, and svg to PDA), atrial flutter s/p ablation, atrial fibrillation, and wide complex tachycardia thought to due to SVT with aberrancy.  Transthoracic echo demonstrated an LVEF of 40-45%, normal cavity size, moderate MR, and moderate AR.  Transesophageal echo demonstrated moderate aortic regurgitation, moderate-severe mitral regurgitation, and a PFO.  Nuclear stress demonstrated a mildly dilated LV with no perfusion defect and an EF of 44%.  The patient is scheduled for  ablation for atrial fibrillation.  Cardiac MRI 1. The left ventricle is moderately dilated.  Wall thickness is normal.  Regional and global LV systolic function are low normal.  The LVEF is calculated at 59%.  2. The right ventricle is normal in cavity size and wall thickness.  Global RV systolic function is low normal to mildly reduced.  The RVEF is  calculated at 50%.  3. Both atria are severely enlarged.  4. The aortic valve is trileaflet in morphology. There is no significant aortic stenosis. There is moderate-severe aortic regurgitation.  The regurgitant orifice area measures 0.26 cm^2.  There is reversal of flow in the proximal descending thoracic aorta.  5.  There is  mild-moderate mitral regurgitation.  There is moderate tricuspid regurgitation.  There is mild pulmonic regurgitation.  6. Delayed enhancement imaging demonstrates no evidence of myocardial infarction, scarring,  or infiltration.  7.  There is no evidence of an intracardiac thrombus.  8.  The pericardium is normal in thickness.  There is no significant pericardial effusion.  Thoracic MRA 1. There are 3 right sided pulmonary veins.  The right lower and right middle pulmonary veins enter the LA immediately adjacent to one another. The right middle vein is small.  The 2 left sided pulmonary veins enter the LA normally.  All veins are patent without significant stenosis proximally.   MRA bi-orthogonal luminal dimensions are listed below: RUPV: 2.0 x 1.8 cm, 76 cm/sec RMPV: 0.6 x 0.5 cm RLPV: 2.0 x 1.7 cm, 60 cm/ sec LUPV: 1.9 x 1.0 cm, 59 cm/ sec LLPV: 1.9 x 0.9 cm, 60 cm/ sec  MRA LA dimensions Head-foot: 7.1 cm Right-left:  6.2 cm Anterior-posterior:  4.3 cm  2. The thoracic aorta is normal in diameter. There is no evidence of a dissection flap.  3. The main and proximal branch pulmonary arteries are normal in size  4. Systemic venous connections are normal.  5. The esophagus is posterior of the left atrium in close proximity to the ostia of the left-sided pulmonary veins.  The descending thoracic aorta is behind the esophagus and behind the left sided pulmonary Veins.  Echo 10/16/17: INTERPRETATION ---------------------------------------------------------------   NORMAL LEFT VENTRICULAR SYSTOLIC FUNCTION WITH MILD LVH   NORMAL LA PRESSURES WITH  DIASTOLIC DYSFUNCTION   NORMAL RIGHT VENTRICULAR SYSTOLIC FUNCTION   VALVULAR REGURGITATION: MODERATE AR, MILD MR, TRIVIAL PR, MILD TR   NO VALVULAR STENOSIS   IRREGULAR RHYTHM   Moderate to severe aortic regurgitation. Mild left ventricular enlargement   suggests color Doppler may underestimate aortic regurgitation severity.   AR VENA CONTRACTA=0.5CM   MILD TO MODERATE TR   NO PRIOR STUDY FOR COMPARISON  Echo 05/02/18: Study Conclusions   - Left ventricle: The cavity size was mildly dilated. Wall   thickness was increased in a pattern of mild LVH. Systolic   function was normal. The estimated ejection fraction was in the   range of 50% to 55%. Wall motion was normal; there were no   regional wall motion abnormalities. Left ventricular diastolic   function parameters were normal. - Aortic valve: There was mild to moderate regurgitation. - Mitral valve: Calcified annulus. There was mild regurgitation. - Left atrium: The atrium was severely dilated. - Tricuspid valve: There was moderate regurgitation. - Pulmonary arteries: Systolic pressure was mildly increased. PA   peak pressure: 41 mm Hg (S).   Impressions:   - Normal LV systolic function; mild LVE and LVH; mild to moderate   AI; mild MR; severe  LAE; moderate TR; mild pulmonary   hypertension.  Echo 03/26/19: INTERPRETATION ---------------------------------------------------------------   NORMAL LEFT VENTRICULAR SYSTOLIC FUNCTION   NORMAL LA PRESSURES WITH DIASTOLIC DYSFUNCTION   NORMAL RIGHT VENTRICULAR SYSTOLIC FUNCTION   VALVULAR REGURGITATION: MODERATE AR, MILD MR, TRIVIAL PR, MILD TR   NO VALVULAR STENOSIS   AR VENA CONTRACTA=0.6CM   MILD TO MODERATE TR   NO PRIOR STUDY FOR COMPARISON   Echo 04/11/20: ECHOCARDIOGRAPHIC DESCRIPTIONS -----------------------------------------------  AORTIC ROOT          Size: MILDLY DILATED    Dissection: INDETERM FOR DISSECTION   AORTIC VALVE      Leaflets: Tricuspid              Morphology: MILDLY THICKENED      Mobility: Fully Mobile   LEFT VENTRICLE                                      Anterior: Normal          Size: MILDLY ENLARGED                        Lateral: Normal   Contraction: Normal                                  Septal: Normal    Closest EF: >55%(Estimated)  Calc.EF: 56% (3D)      Apical: Normal     LV masses: No Masses                             Inferior: Normal           LVH: MILD LVH                             Posterior: Normal   LV GLS(LOL): -18.0%   LV GLS(AVG): -16.6% Normal Range [ <= -16]  Dias.FxClass: N/A   MITRAL VALVE      Leaflets: Normal                  Mobility: Fully mobile    Morphology: Normal   LEFT ATRIUM          Size: MODERATELY ENLARGED     LA masses: No masses                Normal IAS   MAIN PA          Size: DILATED   PULMONIC VALVE    Morphology: Normal      Mobility: Fully Mobile   RIGHT VENTRICLE          Size: Normal                    Free wall: Normal   Contraction: Normal                    RV masses: No Masses         TAPSE:   2.1 cm,  Normal Range [>= 1.6 cm]       RV Note: ANNULAR VELOCITY=10cm/s   TRICUSPID VALVE      Leaflets: Normal                  Mobility:  Fully mobile    Morphology: Normal   RIGHT ATRIUM          Size: MODERATELY ENLARGED        RA Other: None     RA masses: No masses   PERICARDIUM         Fluid: No effusion   INFERIOR VENACAVA          Size: DILATED    Normal respiratory collapse   DOPPLER ECHO and OTHER SPECIAL PROCEDURES ------------------------------------     Aortic: MODERATE AR            No AS      Mitral: MILD MR                No MS     MV Inflow E Vel.= 70.0 cm/s  MV Annulus E'Vel.= 8.5 cm/s  E/E'Ratio= 8   Tricuspid: MILD TR                No TS             2.9 m/s peak TR vel   41 mmHg peak RV pressure   Pulmonary: TRIVIAL PR             No PS       Other:   INTERPRETATION ---------------------------------------------------------------     NORMAL LEFT VENTRICULAR SYSTOLIC FUNCTION WITH MILD LVH    NORMAL RIGHT VENTRICULAR SYSTOLIC FUNCTION    VALVULAR REGURGITATION: MODERATE AR, MILD MR, TRIVIAL PR, MILD TR    NO VALVULAR STENOSIS    MILDLY DILATED LEFT VENTRICLE SUGGESTS POSSIBILITY THAT ECHOCARDIOGRAPHY    UNDERESTIMATES AORTIC REGURGITATION SEVERITY.    AR VENA CONTRACTA=0.6cm    3D acquisition and reconstructions were performed as part of this    examination to more accurately quantify the effects of moderate or greater    valve regurgitation. (post-processing on an Independent workstation).     Compared with prior Echo study on 03/26/2019: NO SIGNIFICANT CHANGES.    Event monitor 12/07/20: Study Highlights  Atrial fibrillation with RVR and intermittent aberrancy. Patient had returned to NSR on 12/01/20    Echo 07/04/21: INTERPRETATION ---------------------------------------------------------------    NORMAL LEFT VENTRICULAR SYSTOLIC FUNCTION WITH MILD LVH    NORMAL RIGHT VENTRICULAR SYSTOLIC FUNCTION    VALVULAR REGURGITATION: MODERATE AR, MILD MR, MILD PR, MILD TR    NO VALVULAR STENOSIS    IRREGULAR RHYTHM THROUGHOUT EXAM    AR VENA CONTRACTA = 0.5 cm    3D acquisition and reconstructions were performed as part of this    examination to more accurately quantify the effects of identified    structural abnormalities as part of the exam. (post-processing on an    Independent workstation).     Compared with prior Echo study on 04/11/2020: MILD TO MODERATE TR    SEVERE BIATRIAL ENLARGEMENT. INCREASED RV DIMENSIONS    NO SIGNIFICANT CHANGE IN AR OR LV FUNCTION    Cardiac MRI 09/11/21: SUMMARY   ==========================================================================================================   Cardiac MRI and thoracic MRA were performed  with and without contrast on a 3.0 T MRI scanner to evaluate  myocardial morphology, function, viability and pulmonary vein anatomy in a 73 y/o patient with hx of GERD,   HTN, hypercholesterolemia, OSA on CPAP, CAD s/p CABG (LIMA to LAD, svg to D1, sequential svg to OM and  distal LCx, and svg to PDA), atrial flutter s/p ablation, PFO, atrial fibrillation, wide complex  tachycardia thought to due to SVT with aberrancy, aortic regurgitation, and  mitral regurgitation who is  scheduled for afib ablation.  Comparisons were made to the prior scan from 10/14/2017.   The cardiac rhythm was atrial fibrillation. Real time imaging was used.  Regurgitant valvular lesions could  not be fully assessed due to the use of real time imaging.  Comprehensive assessment could be performed  following conversion to NSR.   Cardiac MRI  1. The left ventricle is moderately dilated.  Wall thickness is normal.  There are no regional wall motion  abnormalities. During atrial fibrillation, there is low normal to mildly reduced function with an LVEF  visually estimated at 50-55%.   2. The right ventricle is normal in cavity size and wall thickness.  Global RV systolic function is low  normal to mildly reduced.   3. Both atria are severely enlarged.   4. The aortic valve is trileaflet in morphology. There is no significant aortic stenosis. There is at least  moderate-severe aortic regurgitation.   5.  There is at least mild-moderate mitral regurgitation.  There is at least moderate tricuspid  regurgitation. There is mild pulmonic regurgitation.   6. Delayed enhancement imaging demonstrates no evidence of myocardial infarction, scarring,  or  infiltration.   7.  There is no evidence of an intracardiac thrombus.   8.  The pericardium is normal in thickness.  There is no significant pericardial effusion.   Thoracic MRA:  1. There are 3 right sided pulmonary veins.  The right lower and right middle pulmonary veins enter the LA  immediately adjacent to one another. The right middle vein is small.   The 2 left sided pulmonary veins enter the LA normally.   All veins are patent  without significant stenosis proximally.   MRA bi-orthogonal luminal dimensions are listed below:  RUPV: 2.1 x 1.9 cm (prior 2.0 x 1.8 cm, 76 cm/sec)  RMPV: 0.7 x 0.6 cm (prior 0.6 x 0.5 cm)  RLPV: 2.1 x 1.7 cm (prior 2.0 x 1.7 cm, 60 cm/ sec)  LUPV: 1.9 x 1.1 cm (prior 1.9 x 1.0 cm, 59 cm/ sec)  LLPV: 1.7 x 1.0 cm (prior 1.9 x 0.9 cm, 60 cm/ sec)   MRA LA dimensions  Head-foot: 6.9 cm  Right-left: 6.8 cm  Anterior-posterior: 4.5 cm   2. The thoracic aorta is normal in diameter. There is no evidence of a dissection flap.   3. The main and proximal branch pulmonary arteries are normal in size   4. Systemic venous connections are normal.   5. The esophagus traverses posterior to ostia of the left-sided pulmonary veins.  The descending thoracic  aorta is behind the esophagus and behind the left sided pulmonary veins.   Echo 07/26/22:   INTERPRETATION ---------------------------------------------------------------    NORMAL LEFT VENTRICULAR SYSTOLIC FUNCTION WITH MILD LVH    NORMAL RIGHT VENTRICULAR SYSTOLIC FUNCTION    VALVULAR REGURGITATION: MODERATE AR, MILD MR, TRIVIAL PR, MILD TR    NO VALVULAR STENOSIS     Compared with prior Echo study on 07/04/2021: MILD TO MODERATE TR. NO    SIGNIFICANT CHANGE IN AR OR LV SIZE/FUNCTION.   Echo 08/08/23: CONCLUSION -------------------------------------------------------------------------------  MILD LEFT VENTRICULAR SYSTOLIC DYSFUNCTION WITH NO LVH  ESTIMATED EF: 45%, CALC EF(2D): 48%, CALC EF(3D): 47%  DIASTOLIC FUNCTION CAN'T BE DETERMINED  MILD RIGHT VENTRICULAR SYSTOLIC DYSFUNCTION  VALVULAR REGURGITATION: MILD AR, MODERATE MR, TRIVIAL PR, MILD TR  NO VALVULAR STENOSIS  PHYSICIAN IMPRESSIONS --------------------------------------------------------------------  MILD TO MODERATE AORTIC REGURGITATION  MILD TO MODERATE  MITRAL REGURGITAITON  MILD TO MODERATE TRICUSPID REGURGITATION  ESTIMATED LVEF 45%, ARRHTYMIA LIMITS ACCURACY OF LVEF  ASSESSMENT  ESTIMATED RVSP =    ASSESSMENT AND PLAN:  1. Atrial fibriillation with RVR. Prior history of atrial flutter and atrial tachycardia ablation in 2015. On anticoagulation with Eliquis  5 mg bid. ITALY vasc score of 3. On  Toprol  XL.   Now s/p repeat Afib ablation at [facility] on 10/15/17 and again in Feb 2023. Recurrent Afib with DCCV in September and again in Jan. Not felt to be a candidate for Dofetilide due to prolonged QT. Per Dr Melchor  2. Coronary disease status post CABG in 2005. He is without chest pain. He had a normal Myoview  study September 2015. Low risk ETT in August 2018. Repeat nuclear  study at [facility] 2019 showed no ischemia. EF 44%. Will defer further evaluation unless he develops symptoms.  3. Chronic Aortic insufficiency- now mild to moderate.  Pulse pressure normal. No symptoms. Repeat Echo in Jan at [facility] showed no change.   4. 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 Last EF mildly reduced while in Afib.   5. Atrial flutter s/p ablation.   6. OSA- now on CPAP. Followed by Dr. Shlomo.  7. Hyperlipidemia on statin. Last LDL 65.   8. HTN - BP is under good control  9. Left subclavian stenosis. Asymptomatic.     Disposition: FU with me in 6 months  Signed, Jovahn Breit [place], MD  01/23/2024 11:06 AM    [facility]

## 2024-01-21 ENCOUNTER — Other Ambulatory Visit: Payer: Self-pay | Admitting: Cardiology

## 2024-01-21 ENCOUNTER — Other Ambulatory Visit: Payer: Self-pay | Admitting: Family Medicine

## 2024-01-23 ENCOUNTER — Encounter: Payer: Self-pay | Admitting: Cardiology

## 2024-01-23 ENCOUNTER — Ambulatory Visit: Attending: Cardiology | Admitting: Cardiology

## 2024-01-23 VITALS — BP 120/77 | HR 60 | Ht 73.0 in | Wt 204.2 lb

## 2024-01-23 DIAGNOSIS — I4891 Unspecified atrial fibrillation: Secondary | ICD-10-CM | POA: Diagnosis not present

## 2024-01-23 DIAGNOSIS — I2510 Atherosclerotic heart disease of native coronary artery without angina pectoris: Secondary | ICD-10-CM | POA: Diagnosis not present

## 2024-01-23 DIAGNOSIS — I351 Nonrheumatic aortic (valve) insufficiency: Secondary | ICD-10-CM | POA: Diagnosis not present

## 2024-01-23 DIAGNOSIS — Z951 Presence of aortocoronary bypass graft: Secondary | ICD-10-CM

## 2024-01-23 NOTE — Patient Instructions (Signed)

## 2024-02-26 ENCOUNTER — Other Ambulatory Visit: Payer: Self-pay | Admitting: Cardiology

## 2024-02-26 ENCOUNTER — Other Ambulatory Visit: Payer: Self-pay | Admitting: Family Medicine

## 2024-02-26 ENCOUNTER — Ambulatory Visit (INDEPENDENT_AMBULATORY_CARE_PROVIDER_SITE_OTHER): Payer: Medicare HMO

## 2024-02-26 VITALS — BP 118/60 | HR 68 | Temp 98.1°F | Ht 73.0 in | Wt 204.3 lb

## 2024-02-26 DIAGNOSIS — Z0000 Encounter for general adult medical examination without abnormal findings: Secondary | ICD-10-CM

## 2024-02-26 DIAGNOSIS — Z1211 Encounter for screening for malignant neoplasm of colon: Secondary | ICD-10-CM

## 2024-02-26 NOTE — Progress Notes (Signed)
 Subjective:   [name] [name] is a 73 y.o. who presents for a Medicare Wellness preventive visit.  As a reminder, Annual Wellness Visits don't include a physical exam, and some assessments may be limited, especially if this visit is performed virtually. We may recommend an in-person follow-up visit with your provider if needed.  Visit Complete: In person    Persons Participating in Visit: Patient.  AWV Questionnaire: Yes: Patient Medicare AWV questionnaire was completed by the patient on 02/25/24; I have confirmed that all information answered by patient is correct and no changes since this date.  Cardiac Risk Factors include: advanced age (>66men, >70 women);male gender;hypertension     Objective:    Today's Vitals   02/26/24 0957  BP: 118/60  Pulse: 68  Temp: 98.1 F (36.7 C)  TempSrc: Oral  SpO2: 96%  Weight: 204 lb 4.8 oz (92.7 kg)  Height: 6' 1 (1.854 m)   Body mass index is 26.95 kg/m.     02/26/2024   10:16 AM 09/19/2023    9:11 AM 03/14/2023   10:42 AM 02/22/2023   10:34 AM 06/05/2022   10:42 AM 02/16/2022   10:35 AM 03/02/2021    8:58 AM  Advanced Directives  Does Patient Have a Medical Advance Directive? Yes Yes Yes Yes Yes Yes Yes  Type of Estate agent of [place];Living will Living will Living will Healthcare Power of [place];Living will  Healthcare Power of [place];Living will   Does patient want to make changes to medical advance directive?      No - Patient declined Yes (Inpatient - patient defers changing a medical advance directive and declines information at this time)  Copy of Healthcare Power of Attorney in Chart? No - copy requested   No - copy requested  No - copy requested     Current Medications (verified) Outpatient Encounter Medications as of 02/26/2024  Medication Sig   acetaminophen  (TYLENOL ) 325 MG tablet Take 2 tablets (650 mg total) by mouth every 6 (six) hours as needed for up to 30 doses for mild pain or moderate  pain.   apixaban  (ELIQUIS ) 5 MG TABS tablet Take 1 tablet (5 mg total) by mouth 2 (two) times daily.   atorvastatin  (LIPITOR) 80 MG tablet TAKE 1 TABLET BY MOUTH EVERY DAY   cholecalciferol  (VITAMIN D) 1000 UNITS tablet Take 1,000 Units by mouth daily.   Coenzyme Q10 (CO Q 10 PO) Take 100 mg by mouth daily.    colchicine  0.6 MG tablet TAKE TWO AT ONSET OF GOUT AND THEN ONE BY MOUTH 2 TIMES DAILY AS NEEDED   furosemide  (LASIX ) 20 MG tablet TAKE 1 TABLET BY MOUTH TWICE A DAY   losartan  (COZAAR ) 50 MG tablet TAKE 1 TABLET BY MOUTH EVERY DAY   metoprolol  succinate (TOPROL -XL) 50 MG 24 hr tablet Take 1 tablet by mouth daily.   multivitamin (THERAGRAN) per tablet Take 1 tablet by mouth daily.   nitroGLYCERIN  (NITROSTAT ) 0.4 MG SL tablet PLACE 1 TABLET UNDER THE TONGUE EVERY 5 MINUTES AS NEEDED FOR CHEST PAIN.   potassium chloride  SA (K-DUR,KLOR-CON ) 20 MEQ tablet Take 1 tablet (20 mEq total) by mouth daily.   sildenafil  (VIAGRA ) 100 MG tablet Take 1 tablet (100 mg total) by mouth daily as needed for erectile dysfunction.   SKYRIZI PEN 150 MG/ML pen    No facility-administered encounter medications on file as of 02/26/2024.    Allergies (verified) Patient has no known allergies.   History: Past Medical History:  Diagnosis  Date   Chicken pox    Coronary artery disease    Severe three-vessel coronary artery disease with ejection fraction of 65%   GERD (gastroesophageal reflux disease)    History of gout    History of psoriasis    Hypercholesterolemia    Hypertension    OSA (obstructive sleep apnea)    Moderate with AHI of 16.9/hr now on CPAP at 10cm H2O   Psoriatic arthritis [facility])    Past Surgical History:  Procedure Laterality Date   ATRIAL FLUTTER ABLATION  03/30/2014   ATRIAL FLUTTER ABLATION N/A 03/30/2014   Procedure: ATRIAL FLUTTER ABLATION;  Surgeon: Lynwood JONETTA Rakers, MD;  Location: [facility] CATH LAB;  Service: Cardiovascular;  Laterality: N/A;   CARDIAC CATHETERIZATION  09/2003   Ejection  fraction was estimated at 65%.   CARDIOVERSION N/A 03/15/2014   Procedure: CARDIOVERSION;  Surgeon: Redell GORMAN Shallow, MD;  Location: [facility] ENDOSCOPY;  Service: Cardiovascular;  Laterality: N/A;   CARDIOVERSION N/A 07/17/2017   Procedure: CARDIOVERSION;  Surgeon: Shlomo Wilbert SAUNDERS, MD;  Location: [facility] ENDOSCOPY;  Service: Cardiovascular;  Laterality: N/A;   CORONARY ARTERY BYPASS GRAFT  09/2003   [place]-lad,svg-diag,svg-om/distal LCX,svg-pda   TEE WITHOUT CARDIOVERSION N/A 03/15/2014   Procedure: TRANSESOPHAGEAL ECHOCARDIOGRAM (TEE);  Surgeon: Redell GORMAN Shallow, MD;  Location[facility] ENDOSCOPY;  Service: Cardiovascular;  Laterality: N/A;   TEE WITHOUT CARDIOVERSION N/A 07/17/2017   Procedure: TRANSESOPHAGEAL ECHOCARDIOGRAM (TEE);  Surgeon: Shlomo Wilbert SAUNDERS, MD;  Location: [facility] ENDOSCOPY;  Service: Cardiovascular;  Laterality: N/A;   TONSILLECTOMY  1950's   Family History  Problem Relation Age of Onset   Heart disease Mother        cabg   Cancer Mother        breast   Stroke Mother    Social History   Socioeconomic History   Marital status: Married    Spouse name: Not on file   Number of children: 1   Years of education: 4 years college   Highest education level: Bachelor's degree (e.g., BA, AB, BS)  Occupational History   Occupation: retired    Associate Professor: [employer]  Tobacco Use   Smoking status: Former    Current packs/day: 0.50    Average packs/day: 0.5 packs/day for 5.0 years (2.5 ttl pk-yrs)    Types: Cigarettes   Smokeless tobacco: Never   Tobacco comments:    quit smoking cigarettes in the late 1970's  Vaping Use   Vaping status: Never Used  Substance and Sexual Activity   Alcohol use: Yes    Alcohol/week: 4.0 standard drinks of alcohol    Types: 2 Cans of beer, 2 Shots of liquor per week    Comment: 3 times per week, 2-3 drinks each time   Drug use: No   Sexual activity: Yes  Other Topics Concern   Not on file  Social History Narrative   Working part time    Married; HH of 2   1 child   Right handed   Retired    Teacher, early years/pre Strain: Low Risk  (02/26/2024)   Overall Financial Resource Strain (CARDIA)    Difficulty of Paying Living Expenses: Not very hard  Food Insecurity: No Food Insecurity (02/26/2024)   Hunger Vital Sign    Worried About Running Out of Food in the Last Year: Never true    Ran Out of Food in the Last Year: Never true  Transportation Needs: No Transportation Needs (02/26/2024)   PRAPARE - Transportation  Lack of Transportation (Medical): No    Lack of Transportation (Non-Medical): No  Physical Activity: Sufficiently Active (02/26/2024)   Exercise Vital Sign    Days of Exercise per Week: 5 days    Minutes of Exercise per Session: 60 min  Stress: No Stress Concern Present (02/26/2024)   [employer] of Occupational Health - Occupational Stress Questionnaire    Feeling of Stress: Only a little  Social Connections: Socially Integrated (02/26/2024)   Social Connection and Isolation Panel    Frequency of Communication with Friends and Family: Twice a week    Frequency of Social Gatherings with Friends and Family: Twice a week    Attends Religious Services: More than 4 times per year    Active Member of [employer] or Organizations: Yes    Attends Engineer, structural: More than 4 times per year    Marital Status: Married    Tobacco Counseling Counseling given: Not Answered Tobacco comments: quit smoking cigarettes in the late 1970's    Clinical Intake:  Pre-visit preparation completed: Yes  Pain : No/denies pain     BMI - recorded: 26.95 Nutritional Status: BMI 25 -29 Overweight Nutritional Risks: None Diabetes: No  No results found for: HGBA1C   How often do you need to have someone help you when you read instructions, pamphlets, or other written materials from your doctor or pharmacy?: 1 - Never  Interpreter Needed?: No  Information entered by :: Rojelio Blush  LPN   Activities of Daily Living      02/26/2024   10:03 AM 02/25/2024   10:28 AM  In your present state of health, do you have any difficulty performing the following activities:  Hearing? 0 0  Vision? 0 0  Difficulty concentrating or making decisions? 0 0  Walking or climbing stairs? 0 0  Dressing or bathing? 0 0  Doing errands, shopping? 0 0  Preparing Food and eating ? N N  Using the Toilet? N N  In the past six months, have you accidently leaked urine? N N  Do you have problems with loss of bowel control? N N  Managing your Medications? N N  Managing your Finances? N N  Housekeeping or managing your Housekeeping? N N    Patient Care Team: Micheal Wolm ORN, MD as PCP - General (Family Medicine) [place], Peter M, MD as PCP - Cardiology (Cardiology) Liane Sharyne MATSU, [facility] (Inactive) as Pharmacist (Pharmacist)   I have updated your Care Teams any recent Medical Services you may have received from other providers in the past year.     Assessment:   This is a routine wellness examination for [name] [name].  Hearing/Vision screen Hearing Screening - Comments:: Denies hearing difficulties   Vision Screening - Comments:: Wears rx glasses - up to date with routine eye exams with  [facility]   Goals Addressed               This Visit's Progress     Increase physical activity (pt-stated)        Lose weight.       Depression Screen      02/26/2024   10:01 AM 12/24/2023    4:43 PM 02/22/2023   10:09 AM 12/27/2022    9:36 AM 12/27/2022    9:33 AM 07/06/2022    2:23 PM 03/06/2022    8:28 AM  PHQ 2/9 Scores  PHQ - 2 Score 0 0 0 0 0 0 0  PHQ- 9 Score  0  0 0  0 0    Fall Risk      02/26/2024   10:03 AM 02/25/2024   10:28 AM 09/19/2023    9:10 AM 03/14/2023   10:42 AM 02/22/2023   10:32 AM  Fall Risk   Falls in the past year? 0 0 0 0 0  Number falls in past yr: 0 0 0 0 0  Injury with Fall? 0 0 0 0 0  Risk for fall due to : No Fall Risks    No Fall Risks  Follow up Falls  evaluation completed  Falls evaluation completed Falls evaluation completed Falls prevention discussed    MEDICARE RISK AT HOME:   Medicare Risk at Home Any stairs in or around the home?: (Patient-Rptd) Yes If so, are there any without handrails?: (Patient-Rptd) No Home free of loose throw rugs in walkways, pet beds, electrical cords, etc?: (Patient-Rptd) Yes Adequate lighting in your home to reduce risk of falls?: (Patient-Rptd) Yes Life alert?: (Patient-Rptd) No Use of a cane, walker or w/c?: (Patient-Rptd) No Grab bars in the bathroom?: (Patient-Rptd) Yes Shower chair or bench in shower?: (Patient-Rptd) No Elevated toilet seat or a handicapped toilet?: (Patient-Rptd) No  TIMED UP AND GO:  Was the test performed?  Yes  Length of time to ambulate 10 feet: 10 sec Gait steady and fast without use of assistive device  Cognitive Function: 6CIT completed    04/08/2018    8:26 AM 03/14/2017    1:39 PM  MMSE - Mini Mental State Exam  Not completed: -- --        02/26/2024   10:04 AM 02/22/2023   10:34 AM 02/16/2022   10:35 AM 07/21/2019   11:26 AM  6CIT Screen  What Year? 0 points 0 points 0 points 0 points  What month? 0 points 0 points 0 points 0 points  What time? 0 points 0 points 0 points 0 points  Count back from 20 0 points 0 points 0 points 0 points  Months in reverse 0 points 0 points 0 points 0 points  Repeat phrase 0 points 0 points 0 points 0 points  Total Score 0 points 0 points 0 points 0 points    Immunizations Immunization History  Administered Date(s) Administered   Fluad Quad(high Dose 65+) 06/09/2019, 05/07/2022   Hep A, Unspecified 08/05/2000   Influenza, High Dose Seasonal PF 05/06/2017, 06/10/2018   Influenza,inj,Quad PF,6+ Mos 08/20/2013, 03/31/2014   Influenza-Unspecified 05/14/2017, 05/03/2021   Moderna SARS-COV2 Booster Vaccination 05/23/2020, 12/01/2020, 04/02/2021   Moderna Sars-Covid-2 Vaccination 08/17/2019, 09/17/2019   Pneumococcal  Conjugate-13 09/18/2005, 08/20/2013   Pneumococcal Polysaccharide-23 02/26/2020   Td 07/23/2008   Td (Adult),unspecified 02/20/2006, 05/04/2015   Zoster Recombinant(Shingrix) 02/05/2019, 03/16/2022   Zoster, Live 04/02/2005    Screening Tests Health Maintenance  Topic Date Due   COVID-19 Vaccine (3 - Moderna risk series) 04/30/2021   Colonoscopy  11/06/2021   INFLUENZA VACCINE  02/21/2024   Medicare Annual Wellness (AWV)  02/25/2025   DTaP/Tdap/Td (3 - Tdap) 05/03/2025   Pneumococcal Vaccine: 50+ Years  Completed   Hepatitis C Screening  Completed   Zoster Vaccines- Shingrix  Completed   Hepatitis B Vaccines  Aged Out   HPV VACCINES  Aged Out   Meningococcal B Vaccine  Aged Out    Health Maintenance  Health Maintenance Due  Topic Date Due   COVID-19 Vaccine (3 - Moderna risk series) 04/30/2021   Colonoscopy  11/06/2021   INFLUENZA VACCINE  02/21/2024   Health  Maintenance Items Addressed: Referral sent to GI for colonoscopy  Additional Screening:  Vision Screening: Recommended annual ophthalmology exams for early detection of glaucoma and other disorders of the eye. Would you like a referral to an eye doctor? No    Dental Screening: Recommended annual dental exams for proper oral hygiene  Community Resource Referral / Chronic Care Management: CRR required this visit?  No   CCM required this visit?  No   Plan:    I have personally reviewed and noted the following in the patient's chart:   Medical and social history Use of alcohol, tobacco or illicit drugs  Current medications and supplements including opioid prescriptions. Patient is not currently taking opioid prescriptions. Functional ability and status Nutritional status Physical activity Advanced directives List of other physicians Hospitalizations, surgeries, and ER visits in previous 12 months Vitals Screenings to include cognitive, depression, and falls Referrals and appointments  In addition, I  have reviewed and discussed with patient certain preventive protocols, quality metrics, and best practice recommendations. A written personalized care plan for preventive services as well as general preventive health recommendations were provided to patient.   Rojelio LELON Blush, LPN   07/25/7972   After Visit Summary: (In Person-Printed) AVS printed and given to the patient  Notes: Nothing significant to report at this time.

## 2024-02-26 NOTE — Addendum Note (Signed)
 Addended by: TANDA ROJELIO ORN on: 02/26/2024 04:08 PM   Modules accepted: Orders

## 2024-02-26 NOTE — Patient Instructions (Signed)
 [name] , Thank you for taking time out of your busy schedule to complete your Annual Wellness Visit with me. I enjoyed our conversation and look forward to speaking with you again next year. I, as well as your care team,  appreciate your ongoing commitment to your health goals. Please review the following plan we discussed and let me know if I can assist you in the future. Your Game plan/ To Do List    Referrals: If you haven't heard from the office you've been referred to, please reach out to them at the phone provided.   Follow up Visits: We will see or speak with you next year for your Next Medicare AWV with our clinical staff 03/03/25 @ 10a Have you seen your provider in the last 6 months (3 months if uncontrolled diabetes)?   Clinician Recommendations:  Aim for 30 minutes of exercise or brisk walking, 6-8 glasses of water, and 5 servings of fruits and vegetables each day.        This is a list of the screenings recommended for you:  Health Maintenance  Topic Date Due   COVID-19 Vaccine (3 - Moderna risk series) 04/30/2021   Colon Cancer Screening  11/06/2021   Flu Shot  02/21/2024   Medicare Annual Wellness Visit  02/25/2025   DTaP/Tdap/Td vaccine (3 - Tdap) 05/03/2025   Pneumococcal Vaccine for age over 72  Completed   Hepatitis C Screening  Completed   Zoster (Shingles) Vaccine  Completed   Hepatitis B Vaccine  Aged Out   HPV Vaccine  Aged Out   Meningitis B Vaccine  Aged Out    Advanced directives: (Copy Requested) Please bring a copy of your health care power of attorney and living will to the office to be added to your chart at your convenience. You can mail to [facility] [address] [place], [place] [address] or email to [email or web address] Advance Care Planning is important because it:  [x]  Makes sure you receive the medical care that is consistent with your values, goals, and preferences  [x]  It provides guidance to your family and  loved ones and reduces their decisional burden about whether or not they are making the right decisions based on your wishes.  Follow the link provided in your after visit summary or read over the paperwork we have mailed to you to help you started getting your Advance Directives in place. If you need assistance in completing these, please reach out to us  so that we can help you!  See attachments for Preventive Care and Fall Prevention Tips.

## 2024-03-17 NOTE — Progress Notes (Unsigned)
 Assessment/Plan:   1.  Parkinsons Disease, mild  -declines levodopa.  Understands AAN recommendations.  We can discuss in future  -Patient sought second opinion at [facility] a while back, when he didn't fully meet criteria.  Both here and at [facility], patient had declined DaTscan and skin biopsies.    -he is following with dermatology regularly   -he is doing great with exercise  -diet discussed  -discussed gut pathology   2.  Mild dysphagia  -he reports that this is normal right now  -MBE done December, 2023 was normal.  Regular diet with thin liquid recommended.  3.  atrial fibrillation  -Status post multiple cardioversions.  Follows with [facility]ology as well as [facility].  Subjective:   [name] [name] was seen today in follow up for parkinsonism.  Outside records that were made available to me were reviewed.  He has done fairly well since our last visit.  He saw his primary care on June 3.  Notes are reviewed.  Saw cardiology July 3.  No changes were made to his medication.  No falls.  He is exercising with [employer] and going to [employer] 3 days per week for exercise.    Current prescribed movement disorder medications: None   ALLERGIES:  No Known Allergies  CURRENT MEDICATIONS:  Current Meds  Medication Sig   acetaminophen  (TYLENOL ) 325 MG tablet Take 2 tablets (650 mg total) by mouth every 6 (six) hours as needed for up to 30 doses for mild pain or moderate pain.   apixaban  (ELIQUIS ) 5 MG TABS tablet Take 1 tablet (5 mg total) by mouth 2 (two) times daily.   atorvastatin  (LIPITOR) 80 MG tablet TAKE 1 TABLET BY MOUTH EVERY DAY   cholecalciferol  (VITAMIN D) 1000 UNITS tablet Take 1,000 Units by mouth daily.   Coenzyme Q10 (CO Q 10 PO) Take 100 mg by mouth daily.    colchicine  0.6 MG tablet TAKE TWO AT ONSET OF GOUT AND THEN ONE BY MOUTH 2 TIMES DAILY AS NEEDED   furosemide  (LASIX ) 20 MG tablet TAKE 1 TABLET BY MOUTH TWICE A DAY   losartan  (COZAAR ) 50 MG tablet TAKE 1 TABLET BY MOUTH EVERY  DAY   metoprolol  succinate (TOPROL -XL) 50 MG 24 hr tablet Take 1 tablet by mouth daily.   multivitamin (THERAGRAN) per tablet Take 1 tablet by mouth daily.   nitroGLYCERIN  (NITROSTAT ) 0.4 MG SL tablet PLACE 1 TABLET UNDER THE TONGUE EVERY 5 MINUTES AS NEEDED FOR CHEST PAIN.   potassium chloride  SA (K-DUR,KLOR-CON ) 20 MEQ tablet Take 1 tablet (20 mEq total) by mouth daily.   sildenafil  (VIAGRA ) 100 MG tablet Take 1 tablet (100 mg total) by mouth daily as needed for erectile dysfunction.   SKYRIZI PEN 150 MG/ML pen      Objective:   PHYSICAL EXAMINATION:    VITALS:   Vitals:   03/19/24 0836  BP: 116/70  Pulse: 90  SpO2: 96%  Weight: 205 lb 9.6 oz (93.3 kg)  Height: 6' 1 (1.854 m)      GEN:  The patient appears stated age and is in NAD. HEENT:  Normocephalic, atraumatic.  The mucous membranes are moist.  CV:  RRR Lungs:  CTAB  Neurological examination:  Orientation: The patient is alert and oriented x3. Cranial nerves: There is good facial symmetry without facial hypomimia. The speech is fluent and clear. Soft palate rises symmetrically and there is no tongue deviation. Hearing is intact to conversational tone. Sensation: Sensation is intact to light  touch throughout Motor: Strength is at least antigravity x4.  Movement examination: Tone: There is mild increased tone in the LUE  Abnormal movements: there is left upper extremity rest tremor ; there is some LE rest tremor Coordination:  There is no decremation, with any form of RAMS, including alternating supination and pronation of the forearm, hand opening and closing, finger taps, heel taps and toe taps.  Gait and Station: The patient has no difficulty arising out of a deep-seated chair without the use of the hands. The patient's stride length is good with LUE rest tremor, stable  I have reviewed and interpreted the following labs independently    Chemistry      Component Value Date/Time   NA 139 03/06/2022 0909   NA  141 09/13/2021 0000   K 4.6 03/06/2022 0909   CL 102 03/06/2022 0909   CO2 29 03/06/2022 0909   BUN 15 03/06/2022 0909   BUN 8 09/13/2021 0000   CREATININE 0.85 03/06/2022 0909   CREATININE 0.79 02/26/2020 0916   GLU 102 09/13/2021 0000      Component Value Date/Time   CALCIUM  9.7 03/06/2022 0909   ALKPHOS 74 03/06/2022 0909   AST 20 03/06/2022 0909   ALT 16 03/06/2022 0909   BILITOT 1.1 03/06/2022 0909   BILITOT 1.2 11/30/2019 0827       Lab Results  Component Value Date   WBC 5.1 03/06/2022   HGB 15.0 03/06/2022   HCT 44.3 03/06/2022   MCV 91.7 03/06/2022   PLT 157.0 03/06/2022    Lab Results  Component Value Date   TSH 0.85 03/06/2022   Total time spent on today's visit was 22 minutes, including both face-to-face time and nonface-to-face time.  Time included that spent on review of records (prior notes available to me/labs/imaging if pertinent), discussing treatment and goals, answering patient's questions and coordinating care.    Cc:  Micheal Wolm ORN, MD

## 2024-03-19 ENCOUNTER — Encounter: Payer: Self-pay | Admitting: Neurology

## 2024-03-19 ENCOUNTER — Ambulatory Visit: Payer: Medicare HMO | Admitting: Neurology

## 2024-03-19 VITALS — BP 116/70 | HR 90 | Ht 73.0 in | Wt 205.6 lb

## 2024-03-19 DIAGNOSIS — G20A1 Parkinson's disease without dyskinesia, without mention of fluctuations: Secondary | ICD-10-CM

## 2024-03-19 NOTE — Patient Instructions (Signed)
 Register now!  We are planning a Parkinsons Disease educational symposium at [facility], [address] [place], [place], [place] [address] on September 19.  We will have a movement disorder physician expert from [employer] coming to speak and a caregiver speaker.  We will have a panel of experts that will show you who you may need on your team of people on your journey with Parkinsons.  I hope to see you there!  Use this QR code to register by scanning it with the camera app on your phone:      Need more help with registration?  Contact [email or web address]

## 2024-03-23 DIAGNOSIS — I4891 Unspecified atrial fibrillation: Secondary | ICD-10-CM

## 2024-03-23 HISTORY — DX: Unspecified atrial fibrillation: I48.91

## 2024-03-25 ENCOUNTER — Encounter: Payer: Self-pay | Admitting: Physician Assistant

## 2024-03-25 ENCOUNTER — Ambulatory Visit: Payer: Self-pay

## 2024-03-25 ENCOUNTER — Ambulatory Visit: Admitting: Physician Assistant

## 2024-03-25 VITALS — BP 106/45 | HR 84 | Ht 72.0 in | Wt 204.0 lb

## 2024-03-25 DIAGNOSIS — I480 Paroxysmal atrial fibrillation: Secondary | ICD-10-CM

## 2024-03-25 NOTE — Telephone Encounter (Signed)
 Noted

## 2024-03-25 NOTE — Patient Instructions (Addendum)
 VISIT SUMMARY:  You came in today because of an elevated heart rate and possible atrial fibrillation. You have a history of atrial fibrillation and atrial flutter, and your heart rhythm has been stable since January until recently. You are currently on Eliquis  and have had three ablations and multiple cardioversions in the past. Your recent EKG shows atrial flutter and atrial fibrillation with a heart rate of 136 bpm.  YOUR PLAN:  -ATRIAL FIBRILLATION AND ATRIAL FLUTTER: Atrial fibrillation and atrial flutter are types of irregular heartbeats that can lead to an elevated heart rate. You have a chronic condition that has recently worsened.  Please monitor for any red flag symptoms such as severe dizziness or shortness of breath and seek emergency care if necessary.  Camie Espy was the triage nurse at [facility]  Atrial Fibrillation Atrial fibrillation (AFib) is a type of irregular or rapid heartbeat (arrhythmia). In AFib, the top part of the heart (atria) beats in an irregular pattern. This makes the heart unable to pump blood normally and effectively. The goal of treatment is to prevent blood clots from forming, control your heart rate, or restore your heartbeat to a normal rhythm. If this condition is not treated, it can cause serious problems, such as a weakened heart muscle (cardiomyopathy) or a stroke. What are the causes? This condition is often caused by medical conditions that damage the heart's electrical system. These include: High blood pressure (hypertension). This is the most common cause. Certain heart problems or conditions, such as heart failure, coronary artery disease, heart valve problems, or heart surgery. Diabetes. Overactive thyroid  (hyperthyroidism). Chronic kidney disease. Certain lung conditions, such as emphysema, pneumonia, or COPD. Obstructive sleep apnea. In some cases, the cause of this condition is not known. What increases the risk? This condition is more likely to  develop in: Older adults. Athletes who do endurance exercise. People who have a family history of AFib. Males. People who are Caucasian. People who are obese. People who smoke or misuse alcohol. What are the signs or symptoms? Symptoms of this condition include: Fast or irregular heartbeats (palpitations). Discomfort or pain in your chest. Shortness of breath. Sudden light-headedness or weakness. Tiring easily during exercise or activity. Syncope (fainting). Sweating. In some cases, there are no symptoms. How is this diagnosed? Your health care provider may detect AFib when taking your pulse. If detected, this condition may be diagnosed with: An electrocardiogram (ECG) to check electrical signals of the heart. An ambulatory cardiac monitor to record your heart's activity for a few days. A transthoracic echocardiogram (TTE) to create pictures of your heart. A transesophageal echocardiogram (TEE) to create even clearer pictures of your heart. A stress test to check your blood supply while you exercise. Imaging tests, such as a CT scan or chest X-ray. Blood tests. How is this treated? Treatment depends on underlying conditions and how you feel when you get AFib. This condition may be treated with: Medicines to prevent blood clots or to treat heart rate or heart rhythm problems. Electrical cardioversion to reset the heart's rhythm. A pacemaker to correct abnormal heart rhythm. Ablation to remove the heart tissue that sends abnormal signals. Left atrial appendage closure to seal the area where blood clots can form. In some cases, underlying conditions will be treated. Follow these instructions at home: Medicines Take over-the counter and prescription medicines only as told by your provider. Do not take any new medicines without talking to your provider. If you are taking blood thinners: Talk with your provider before  taking aspirin  or NSAIDs. These medicines can raise your risk of  bleeding. Take your medicines as told. Take them at the same time each day. Do not do things that could hurt or bruise you. Be careful to avoid falls. Wear an alert bracelet or carry a card that says that you take blood thinners. Lifestyle Do not use any products that contain nicotine or tobacco. These products include cigarettes, chewing tobacco, and vaping devices, such as e-cigarettes. If you need help quitting, ask your provider. Eat heart-healthy foods. Talk with a food expert (dietitian) to make an eating plan that is right for you. Exercise regularly as told by your provider. Do not drink alcohol. Lose weight if you are overweight. General instructions If you have obstructive sleep apnea, manage your condition as told by your provider. Do not use diet pills unless your provider approves. Diet pills can make heart problems worse. Keep all follow-up visits. Your provider will want to check your heart rate and rhythm regularly. Contact a health care provider if: You notice a change in the rate, rhythm, or strength of your heartbeat. You are taking a blood thinner and you notice more bruising. You tire more easily when you exercise or do heavy work. You have a sudden change in weight. Get help right away if:  You have chest pain. You have trouble breathing. You have side effects of blood thinners, such as blood in your vomit, poop (stool), or pee (urine), or bleeding that does not stop. You have any symptoms of a stroke. BE FAST is an easy way to remember the main warning signs of a stroke: B - Balance. Signs are dizziness, sudden trouble walking, or loss of balance. E - Eyes. Signs are trouble seeing or a sudden change in vision. F - Face. Signs are sudden weakness or numbness of the face, or the face or eyelid drooping on one side. A - Arms. Signs are weakness or numbness in an arm. This happens suddenly and usually on one side of the body. S - Speech.Signs are sudden trouble  speaking, slurred speech, or trouble understanding what people say. T - Time. Time to call emergency services. Write down what time symptoms started. Other signs of a stroke, such as: A sudden, severe headache with no known cause. Nausea or vomiting. Seizure. These symptoms may be an emergency. Get help right away. Call 911. Do not wait to see if the symptoms will go away. Do not drive yourself to the hospital. This information is not intended to replace advice given to you by your health care provider. Make sure you discuss any questions you have with your health care provider. Document Revised: 03/28/2022 Document Reviewed: 03/28/2022 Elsevier Patient Education  2024 [employer].

## 2024-03-25 NOTE — Telephone Encounter (Signed)
 I spoke with the patient and offered in office appointment for EKG on 03/27/2024 but patient declined. Patient stated he is heading to [facility] mobile bus for EKG at the advice of RN triage nurse.

## 2024-03-25 NOTE — Telephone Encounter (Signed)
  FYI Only or Action Required?: Action required by provider: request for appointment.  Patient was last seen in primary care on 12/24/2023 by Micheal Wolm ORN, MD.  Called Nurse Triage reporting Palpitations.  Symptoms began today.  Interventions attempted: Nothing.  Symptoms are: unchanged.  Triage Disposition: Call PCP Within 24 Hours  Patient/caregiver understands and will follow disposition?:  No openings today at PCP office until Friday. Pt stated he spoke to his electrophysiologist who asked him to go to PCP office for EKG and fax to that provider to see if in afib. Pt has h/o afib.  Was advised there is a 12 lead EKG on mobile bus.          Copied from CRM [id number]. Topic: Clinical - Red Word Triage >> Mar 25, 2024  2:01 PM Henretta I wrote: Red Word that prompted transfer to Nurse Triage: Patient thinks he's in atrial fib, rapid/ irregular heart rate anywhere between 100 and 140 Reason for Disposition  Heart rhythm alert (e.g., you have irregular heartbeat) from personal wearable device (e.g., Apple Watch)  Answer Assessment - Initial Assessment Questions 1. DESCRIPTION: Please describe your heart rate or heartbeat that you are having (e.g., fast/slow, regular/irregular, skipped or extra beats, palpitations)     Irregular  2. ONSET: When did it start? (e.g., minutes, hours, days)      *No Answer* 3. DURATION: How long does it last (e.g., seconds, minutes, hours)     ongoing 4. PATTERN Does it come and go, or has it been constant since it started?  Does it get worse with exertion?   Are you feeling it now?     constant  6. HEART RATE: Can you tell me your heart rate? How many beats in 15 seconds?  Note: Not all patients can do this.       100-145 lower in am higher in pm      120/84 7. RECURRENT SYMPTOM: Have you ever had this before? If Yes, ask: When was the last time? and What happened that time?      Yes has had mutilple times and has  been cardioverted and had ablation in past 8. CAUSE: What do you think is causing the palpitations?     A fib  9. CARDIAC HISTORY: Do you have any history of heart disease? (e.g., heart attack, angina, bypass surgery, angioplasty, arrhythmia)      A fib  10. OTHER SYMPTOMS: Do you have any other symptoms? (e.g., dizziness, chest pain, sweating, difficulty breathing)       no  Protocols used: Heart Rate and Heartbeat Questions-A-AH

## 2024-03-25 NOTE — Progress Notes (Signed)
 New Patient Office Visit  Subjective    Patient ID: [name] [name], male    DOB: [birth year]  Age: 73 y.o. MRN: [id number]  CC:  Chief Complaint  Patient presents with   Irregular Heart Beat    90-145 for the past few days  Denies pain, light headiness, or dizziness     Discussed the use of AI scribe software for clinical note transcription with the patient, who gave verbal consent to proceed.  History of Present Illness  [name] [name] is a 73 year old male with atrial fibrillation and atrial flutter who presents with elevated heart rate and possible atrial fibrillation.  He has experienced atrial fibrillation and atrial flutter for five years, with three ablations and multiple cardioversions. His heart rhythm was stable since a cardioversion in January until three to five days ago when he noticed an elevated heart rate. His KardiaMobile device indicated 'possible atrial fibrillation'.  He does not have dizziness or shortness of breath currently, though he recalls past episodes of exertional dyspnea.  He is on Eliquis  as part of his treatment regimen.  He underwent coronary artery bypass surgery 20 years ago and has a heart valve issue under regular monitoring, with the last evaluation in January showing no immediate concerns.   Results DIAGNOSTIC EKG: Atrial flutter and atrial fibrillation, heart rate 136 bpm (03/25/2024)   Outpatient Encounter Medications as of 03/25/2024  Medication Sig   acetaminophen  (TYLENOL ) 325 MG tablet Take 2 tablets (650 mg total) by mouth every 6 (six) hours as needed for up to 30 doses for mild pain or moderate pain.   apixaban  (ELIQUIS ) 5 MG TABS tablet Take 1 tablet (5 mg total) by mouth 2 (two) times daily.   atorvastatin  (LIPITOR) 80 MG tablet TAKE 1 TABLET BY MOUTH EVERY DAY   cholecalciferol  (VITAMIN D) 1000 UNITS tablet Take 1,000 Units by mouth daily.   Coenzyme Q10 (CO Q 10 PO) Take 100 mg by mouth daily.    colchicine  0.6 MG  tablet TAKE TWO AT ONSET OF GOUT AND THEN ONE BY MOUTH 2 TIMES DAILY AS NEEDED   furosemide  (LASIX ) 20 MG tablet TAKE 1 TABLET BY MOUTH TWICE A DAY   losartan  (COZAAR ) 50 MG tablet TAKE 1 TABLET BY MOUTH EVERY DAY   metoprolol  succinate (TOPROL -XL) 50 MG 24 hr tablet Take 1 tablet by mouth daily.   multivitamin (THERAGRAN) per tablet Take 1 tablet by mouth daily.   nitroGLYCERIN  (NITROSTAT ) 0.4 MG SL tablet PLACE 1 TABLET UNDER THE TONGUE EVERY 5 MINUTES AS NEEDED FOR CHEST PAIN.   potassium chloride  SA (K-DUR,KLOR-CON ) 20 MEQ tablet Take 1 tablet (20 mEq total) by mouth daily.   sildenafil  (VIAGRA ) 100 MG tablet Take 1 tablet (100 mg total) by mouth daily as needed for erectile dysfunction.   SKYRIZI PEN 150 MG/ML pen    No facility-administered encounter medications on file as of 03/25/2024.    Past Medical History:  Diagnosis Date   Chicken pox    Coronary artery disease    Severe three-vessel coronary artery disease with ejection fraction of 65%   GERD (gastroesophageal reflux disease)    History of gout    History of psoriasis    Hypercholesterolemia    Hypertension    OSA (obstructive sleep apnea)    Moderate with AHI of 16.9/hr now on CPAP at 10cm H2O   Psoriatic arthritis [facility])     Past Surgical History:  Procedure Laterality Date   ATRIAL FLUTTER  ABLATION  03/30/2014   ATRIAL FLUTTER ABLATION N/A 03/30/2014   Procedure: ATRIAL FLUTTER ABLATION;  Surgeon: Lynwood JONETTA Rakers, MD;  Location: [facility];  Service: Cardiovascular;  Laterality: N/A;   CARDIAC CATHETERIZATION  09/2003   Ejection fraction was estimated at 65%.   CARDIOVERSION N/A 03/15/2014   Procedure: CARDIOVERSION;  Surgeon: Redell GORMAN Shallow, MD;  Location: [facility] ENDOSCOPY;  Service: Cardiovascular;  Laterality: N/A;   CARDIOVERSION N/A 07/17/2017   Procedure: CARDIOVERSION;  Surgeon: Shlomo Wilbert SAUNDERS, MD;  Location: [facility] ENDOSCOPY;  Service: Cardiovascular;  Laterality: N/A;   CORONARY ARTERY BYPASS GRAFT  09/2003    Lima-lad,svg-diag,svg-om/distal LCX,svg-pda   TEE WITHOUT CARDIOVERSION N/A 03/15/2014   Procedure: TRANSESOPHAGEAL ECHOCARDIOGRAM (TEE);  Surgeon: Redell GORMAN Shallow, MD;  Location: [facility] ENDOSCOPY;  Service: Cardiovascular;  Laterality: N/A;   TEE WITHOUT CARDIOVERSION N/A 07/17/2017   Procedure: TRANSESOPHAGEAL ECHOCARDIOGRAM (TEE);  Surgeon: Shlomo Wilbert SAUNDERS, MD;  Location: [facility] ENDOSCOPY;  Service: Cardiovascular;  Laterality: N/A;   TONSILLECTOMY  1950's    Family History  Problem Relation Age of Onset   Heart disease Mother        cabg   Cancer Mother        breast   Stroke Mother     Social History   Socioeconomic History   Marital status: Married    Spouse name: Not on file   Number of children: 1   Years of education: 4 years college   Highest education level: Bachelor's degree (e.g., BA, AB, BS)  Occupational History   Occupation: retired    Associate Professor: [employer]  Tobacco Use   Smoking status: Former    Current packs/day: 0.50    Average packs/day: 0.5 packs/day for 5.0 years (2.5 ttl pk-yrs)    Types: Cigarettes   Smokeless tobacco: Never   Tobacco comments:    quit smoking cigarettes in the late 1970's  Vaping Use   Vaping status: Never Used  Substance and Sexual Activity   Alcohol use: Yes    Alcohol/week: 4.0 standard drinks of alcohol    Types: 2 Cans of beer, 2 Shots of liquor per week    Comment: 3 times per week, 2-3 drinks each time   Drug use: No   Sexual activity: Yes  Other Topics Concern   Not on file  Social History Narrative   Working part time   Married; HH of 2   1 child   Right handed   Retired    Teacher, early years/pre Strain: Low Risk  (02/26/2024)   Overall Financial Resource Strain (CARDIA)    Difficulty of Paying Living Expenses: Not very hard  Food Insecurity: No Food Insecurity (02/26/2024)   Hunger Vital Sign    Worried About Running Out of Food in the Last Year: Never true    Ran Out of  Food in the Last Year: Never true  Transportation Needs: No Transportation Needs (02/26/2024)   PRAPARE - Administrator, Civil Service (Medical): No    Lack of Transportation (Non-Medical): No  Physical Activity: Sufficiently Active (02/26/2024)   Exercise Vital Sign    Days of Exercise per Week: 5 days    Minutes of Exercise per Session: 60 min  Stress: No Stress Concern Present (02/26/2024)   [employer] of Occupational Health - Occupational Stress Questionnaire    Feeling of Stress: Only a little  Social Connections: Socially Integrated (02/26/2024)   Social Connection and Isolation Panel  Frequency of Communication with Friends and Family: Twice a week    Frequency of Social Gatherings with Friends and Family: Twice a week    Attends Religious Services: More than 4 times per year    Active Member of [employer] or Organizations: Yes    Attends Engineer, structural: More than 4 times per year    Marital Status: Married  Catering manager Violence: Not At Risk (02/26/2024)   Humiliation, Afraid, Rape, and Kick questionnaire    Fear of Current or Ex-Partner: No    Emotionally Abused: No    Physically Abused: No    Sexually Abused: No    Review of Systems  Constitutional: Negative.   HENT: Negative.    Eyes: Negative.   Respiratory:  Negative for shortness of breath.   Cardiovascular:  Positive for palpitations. Negative for chest pain.  Gastrointestinal:  Negative for nausea and vomiting.  Genitourinary: Negative.   Musculoskeletal: Negative.   Skin: Negative.   Neurological:  Negative for dizziness, focal weakness, weakness and headaches.  Endo/Heme/Allergies: Negative.   Psychiatric/Behavioral: Negative.          Objective    BP (!) 106/45 (BP Location: Left Arm, Patient Position: Sitting, Cuff Size: Large)   Pulse 84   Ht 6' (1.829 m)   Wt 204 lb (92.5 kg)   SpO2 96%   BMI 27.67 kg/m   Physical Exam Vitals and nursing note reviewed.   Constitutional:      Appearance: Normal appearance.  HENT:     Head: Normocephalic and atraumatic.     Right Ear: External ear normal.     Left Ear: External ear normal.     Nose: Nose normal.     Mouth/Throat:     Mouth: Mucous membranes are moist.     Pharynx: Oropharynx is clear.  Cardiovascular:     Rate and Rhythm: Rhythm irregular.  Pulmonary:     Effort: Pulmonary effort is normal.     Breath sounds: Normal breath sounds.  Musculoskeletal:        General: Normal range of motion.     Cervical back: Normal range of motion and neck supple.  Skin:    General: Skin is warm and dry.  Neurological:     General: No focal deficit present.     Mental Status: He is alert and oriented to person, place, and time.  Psychiatric:        Mood and Affect: Mood normal.        Behavior: Behavior normal.        Thought Content: Thought content normal.        Judgment: Judgment normal.         Assessment & Plan:   Problem List Items Addressed This Visit   None Visit Diagnoses       Paroxysmal atrial fibrillation (HCC)    -  Primary   Relevant Orders   EKG 12-Lead (Completed)      Assessment and Plan Atrial fibrillation and atrial flutter Chronic atrial fibrillation and atrial flutter with recent exacerbation. Underwent three ablations and numerous cardioversions. On Eliquis . Todays EKG shows atrial flutter and atrial fibrillation with heart rate of 136 bpm. Electrophysiologist advised ECG for further evaluation. Aware of red flag symptoms and emergency care need. - Obtain ECG  - Contact electrophysiologist's nurse to inform him of EKG results and ensure availability in Epic. - Advise him to monitor for red flag symptoms and seek emergency care if necessary. Patient understands  and agrees   I have reviewed the patient's medical history (PMH, PSH, Social History, Family History, Medications, and allergies) , and have been updated if relevant. I spent 30 minutes reviewing chart  and  face to face time with patient.    Return if symptoms worsen or fail to improve.   Kirk RAMAN Mayers, PA-C

## 2024-03-26 ENCOUNTER — Ambulatory Visit

## 2024-03-26 ENCOUNTER — Telehealth: Payer: Self-pay | Admitting: *Deleted

## 2024-03-26 ENCOUNTER — Telehealth: Payer: Self-pay

## 2024-03-26 NOTE — Telephone Encounter (Signed)
Patient not at this practice

## 2024-03-26 NOTE — Telephone Encounter (Signed)
 Copied from CRM [id number]. Topic: Clinical - Medical Advice >> Mar 26, 2024 12:33 PM [name] [name] wrote: Reason for CRM: pt called and stated that yesterday he had an E[facility] at [facility] on [address]. He asking if theres way for the office to get a copy from them so he can either pick it up from the office or have it faxed to him. Please call and advise.

## 2024-03-26 NOTE — Telephone Encounter (Signed)
 Copied from CRM [id number]. Topic: Clinical - Lab/Test Results >> Mar 26, 2024  8:54 AM Treva T wrote: Reason for CRM: Received call from patient, requesting results from EKG performed on 03/25/24, at mobile clinic, be printed and faxed to cardiologist at [facility].  Patient reports unable to see results via mychart, and provider needs results faxed to office as soon as possible, as an ablation procedure may need to be schedule per patient.   Cardiologist is Dr. Lynwood Daubert Attn. Elouise Louder (nurse to Dr. Melchor) is requesting results.  PH. [phone] Fx. [phone]  Patient also can be reached [phone]

## 2024-03-27 NOTE — Telephone Encounter (Signed)
 Patient informed of the message below and voiced understanding

## 2024-03-30 DIAGNOSIS — I4819 Other persistent atrial fibrillation: Secondary | ICD-10-CM | POA: Diagnosis not present

## 2024-03-30 DIAGNOSIS — I4892 Unspecified atrial flutter: Secondary | ICD-10-CM | POA: Diagnosis not present

## 2024-03-30 DIAGNOSIS — I428 Other cardiomyopathies: Secondary | ICD-10-CM | POA: Diagnosis not present

## 2024-03-31 DIAGNOSIS — Z87891 Personal history of nicotine dependence: Secondary | ICD-10-CM | POA: Diagnosis not present

## 2024-03-31 DIAGNOSIS — I4819 Other persistent atrial fibrillation: Secondary | ICD-10-CM | POA: Diagnosis not present

## 2024-03-31 DIAGNOSIS — I440 Atrioventricular block, first degree: Secondary | ICD-10-CM | POA: Diagnosis not present

## 2024-04-16 ENCOUNTER — Encounter: Payer: Self-pay | Admitting: Physician Assistant

## 2024-04-25 ENCOUNTER — Other Ambulatory Visit: Payer: Self-pay | Admitting: Cardiology

## 2024-04-25 DIAGNOSIS — I4891 Unspecified atrial fibrillation: Secondary | ICD-10-CM

## 2024-04-27 NOTE — Telephone Encounter (Signed)
 Prescription refill request for Eliquis received. Indication:afib Last office visit: Scr: Age:  Weight:

## 2024-04-29 ENCOUNTER — Other Ambulatory Visit: Payer: Self-pay | Admitting: Cardiology

## 2024-04-29 ENCOUNTER — Encounter: Payer: Self-pay | Admitting: Cardiology

## 2024-04-29 DIAGNOSIS — I4891 Unspecified atrial fibrillation: Secondary | ICD-10-CM

## 2024-04-29 NOTE — Telephone Encounter (Addendum)
 Eliquis  5mg  refill request received. Patient is 73 years old, weight-92.5kg, Crea-1.0 on 04/01/23 via [facility]-need labs, Diagnosis-Afib, and last seen by Dr. Swaziland on 01/23/24. Dose is appropriate based on dosing criteria.   Called patient and left a message.  Patient returned the call and states he will be able to go to the lab next week. Advised will order and send requisition as well. Also, he states he is almost out and I stated will send in a refill a this time. He was thankful for the assistance.

## 2024-05-01 ENCOUNTER — Ambulatory Visit (HOSPITAL_COMMUNITY): Admission: EM | Admit: 2024-05-01 | Discharge: 2024-05-01 | Disposition: A | Discharge status: Home / Self Care

## 2024-05-01 ENCOUNTER — Encounter (HOSPITAL_COMMUNITY): Payer: Self-pay

## 2024-05-01 DIAGNOSIS — I83892 Varicose veins of left lower extremities with other complications: Secondary | ICD-10-CM

## 2024-05-01 NOTE — ED Provider Notes (Signed)
 [facility]    CSN: [id number] Arrival date & time: 05/01/24  1509      History   Chief Complaint Chief Complaint  Patient presents with   Wound Check    HPI [name] [name] is a 73 y.o. male.   Patient, currently taking Eliquis , presents today due to profuse bleeding of left lateral ankle after scratching a scab. Wife came home and applied a pressure dressing. No visible bleeding noted on exam.   Wound Check    Past Medical History:  Diagnosis Date   Chicken pox    Coronary artery disease    Severe three-vessel coronary artery disease with ejection fraction of 65%   GERD (gastroesophageal reflux disease)    History of gout    History of psoriasis    Hypercholesterolemia    Hypertension    OSA (obstructive sleep apnea)    Moderate with AHI of 16.9/hr now on CPAP at 10cm H2O   Psoriatic arthritis [facility])     Patient Active Problem List   Diagnosis Date Noted   Psoriatic arthritis (HCC)    History of psoriasis    History of gout    GERD (gastroesophageal reflux disease)    Chicken pox    Persistent atrial fibrillation (HCC)    Erectile dysfunction 05/06/2017   Overweight (BMI 25.0-29.9) 08/06/2016   OSA (obstructive sleep apnea) 08/06/2014   Snoring 05/22/2014   Hypertension    Atrial flutter (HCC) 03/14/2014   Psoriasis 02/27/2013   Gout 02/27/2013   S/P CABG (coronary artery bypass graft) 06/29/2011   Coronary artery disease    Hypercholesterolemia     Past Surgical History:  Procedure Laterality Date   ATRIAL FLUTTER ABLATION  03/30/2014   ATRIAL FLUTTER ABLATION N/A 03/30/2014   Procedure: ATRIAL FLUTTER ABLATION;  Surgeon: Lynwood JONETTA Rakers, MD;  Location: [facility] CATH [facility];  Service: Cardiovascular;  Laterality: N/A;   CARDIAC CATHETERIZATION  09/2003   Ejection fraction was estimated at 65%.   CARDIOVERSION N/A 03/15/2014   Procedure: CARDIOVERSION;  Surgeon: Redell GORMAN Shallow, MD;  Location: [facility] ENDOSCOPY;  Service: Cardiovascular;  Laterality:  N/A;   CARDIOVERSION N/A 07/17/2017   Procedure: CARDIOVERSION;  Surgeon: Shlomo Wilbert SAUNDERS, MD;  Location: [facility] ENDOSCOPY;  Service: Cardiovascular;  Laterality: N/A;   CORONARY ARTERY BYPASS GRAFT  09/2003   Lima-lad,svg-diag,svg-om/distal LCX,svg-pda   TEE WITHOUT CARDIOVERSION N/A 03/15/2014   Procedure: TRANSESOPHAGEAL ECHOCARDIOGRAM (TEE);  Surgeon: Redell GORMAN Shallow, MD;  Location: [facility] ENDOSCOPY;  Service: Cardiovascular;  Laterality: N/A;   TEE WITHOUT CARDIOVERSION N/A 07/17/2017   Procedure: TRANSESOPHAGEAL ECHOCARDIOGRAM (TEE);  Surgeon: Shlomo Wilbert SAUNDERS, MD;  Location: [facility] ENDOSCOPY;  Service: Cardiovascular;  Laterality: N/A;   TONSILLECTOMY  1950's       Home Medications    Prior to Admission medications   Medication Sig Start Date End Date Taking? Authorizing Provider  acetaminophen  (TYLENOL ) 325 MG tablet Take 2 tablets (650 mg total) by mouth every 6 (six) hours as needed for up to 30 doses for mild pain or moderate pain. 11/16/20  Yes Cottie Donnice PARAS, MD  apixaban  (ELIQUIS ) 5 MG TABS tablet TAKE 1 TABLET BY MOUTH TWICE A DAY 04/29/24  Yes Swaziland, Peter M, MD  atorvastatin  (LIPITOR) 80 MG tablet TAKE 1 TABLET BY MOUTH EVERY DAY 01/15/24  Yes Swazil[place], Peter M, MD  cholecalciferol  (VITAMIN D) 1000 UNITS tablet Take 1,000 Units by mouth daily.   Yes [provider]  Coenzyme Q10 (CO Q 10 PO) Take 100 mg  by mouth daily.    Yes [provider]  colchicine  0.6 MG tablet TAKE TWO AT ONSET OF GOUT AND THEN ONE BY MOUTH 2 TIMES DAILY AS NEEDED 02/26/24  Yes Burchette, Wolm ORN, MD  furosemide  (LASIX ) 20 MG tablet TAKE 1 TABLET BY MOUTH TWICE A DAY 02/27/24  Yes Swazil[place], Peter M, MD  losartan  (COZAAR ) 50 MG tablet TAKE 1 TABLET BY MOUTH EVERY DAY 08/29/23  Yes Swaziland, Peter M, MD  metoprolol  succinate (TOPROL -XL) 50 MG 24 hr tablet Take 1 tablet by mouth daily. 11/05/22  Yes [provider]  multivitamin (THERAGRAN) per tablet Take 1 tablet by mouth daily.   Yes [provider]  nitroGLYCERIN  (NITROSTAT ) 0.4 MG SL tablet PLACE 1 TABLET UNDER THE TONGUE EVERY 5 MINUTES AS NEEDED FOR CHEST PAIN. 05/17/23  Yes Swazil[place], Peter M, MD  potassium chloride  SA (K-DUR,KLOR-CON ) 20 MEQ tablet Take 1 tablet (20 mEq total) by mouth daily. 07/08/17  Yes [place]az[place], Peter M, MD  sildenafil  (VIAGRA ) 100 MG tablet Take 1 tablet (100 mg total) by mouth daily as needed for erectile dysfunction. 12/24/23  Yes Burchette, Wolm ORN, MD  SKYRIZI PEN 150 MG/ML pen  11/18/23  Yes [provider]    Family History Family History  Problem Relation Age of Onset   Heart disease Mother        cabg   Cancer Mother        breast   Stroke Mother     Social History Social History   Tobacco Use   Smoking status: Former    Current packs/day: 0.50    Average packs/day: 0.5 packs/day for 5.0 years (2.5 ttl pk-yrs)    Types: Cigarettes   Smokeless tobacco: Never   Tobacco comments:    quit smoking cigarettes in the late 1970's  Vaping Use   Vaping status: Never Used  Substance Use Topics   Alcohol use: Yes    Alcohol/week: 4.0 standard drinks of alcohol    Types: 2 Cans of beer, 2 Shots of liquor per week    Comment: 3 times per week, 2-3 drinks each time   Drug use: No     Allergies   Patient has no known allergies.   Review of Systems Review of Systems   Physical Exam Triage Vital Signs ED Triage Vitals  Encounter Vitals Group     BP 05/01/24 1616 134/66     Girls Systolic BP Percentile --      Girls Diastolic BP Percentile --      Boys Systolic BP Percentile --      Boys Diastolic BP Percentile --      Pulse Rate 05/01/24 1613 67     Resp 05/01/24 1613 18     Temp 05/01/24 1616 97.7 F (36.5 C)     Temp Source 05/01/24 1613 Oral     SpO2 05/01/24 1613 98 %     Weight --      Height --      Head Circumference --      Peak Flow --      Pain Score --      Pain Loc --      Pain Education --      Exclude from Growth Chart --    No data  found.  Updated Vital Signs BP 134/66 (BP Location: Left Arm)   Pulse 67   Temp 97.7 F (36.5 C) (Oral)   Resp 18   SpO2 98%  Visual Acuity Right Eye Distance:   Left Eye Distance:   Bilateral Distance:    Right Eye Near:   Left Eye Near:    Bilateral Near:     Physical Exam Vitals and nursing note reviewed.  Constitutional:      General: He is not in acute distress.    Appearance: Normal appearance. He is not ill-appearing, toxic-appearing or diaphoretic.  Eyes:     General: No scleral icterus. Cardiovascular:     Rate and Rhythm: Normal rate and regular rhythm.     Heart sounds: Normal heart sounds.  Pulmonary:     Effort: Pulmonary effort is normal. No respiratory distress.     Breath sounds: Normal breath sounds. No wheezing or rhonchi.  Musculoskeletal:     Comments: Several small varicosed veins noted of left foot and ankle, no active bleeding noted only dry blood  Skin:    General: Skin is warm.  Neurological:     Mental Status: He is alert and oriented to person, place, and time.  Psychiatric:        Mood and Affect: Mood normal.        Behavior: Behavior normal.      UC Treatments / Results  Labs (all labs ordered are listed, but only abnormal results are displayed) Labs Reviewed - No data to display  EKG   Radiology No results found.  Procedures Procedures (including critical care time)  Medications Ordered in UC Medications - No data to display  Initial Impression / Assessment and Plan / UC Course  I have reviewed the triage vital signs and the nursing notes.  Pertinent labs & imaging results that were available during my care of the patient were reviewed by me and considered in my medical decision making (see chart for details).     Bleeding varicose vein-re-applied pressure dressing and advised patient to keep on overnight and take off in the morning. Final Clinical Impressions(s) / UC Diagnoses   Final diagnoses:  Bleeding from  varicose veins of lower extremity, left     Discharge Instructions      Keep compression dressing on until tomorrow and then remove    ED Prescriptions   None    PDMP not reviewed this encounter.   Andra Corean BROCKS, PA-C 05/01/24 1659

## 2024-05-01 NOTE — ED Triage Notes (Signed)
 Patient is here for a wound check on his left ankle. Patient states he scratch his ankle could not stop the bleeding this afternoon.

## 2024-05-01 NOTE — Discharge Instructions (Signed)
 Keep compression dressing on until tomorrow and then remove

## 2024-05-07 DIAGNOSIS — I4891 Unspecified atrial fibrillation: Secondary | ICD-10-CM | POA: Diagnosis not present

## 2024-05-08 ENCOUNTER — Ambulatory Visit: Payer: Self-pay | Admitting: Cardiology

## 2024-05-08 LAB — CBC
Hematocrit: 46.8 % (ref 37.5–51.0)
Hemoglobin: 15.5 g/dL (ref 13.0–17.7)
MCH: 31.2 pg (ref 26.6–33.0)
MCHC: 33.1 g/dL (ref 31.5–35.7)
MCV: 94 fL (ref 79–97)
Platelets: 166 x10E3/uL (ref 150–450)
RBC: 4.97 x10E6/uL (ref 4.14–5.80)
RDW: 12 % (ref 11.6–15.4)
WBC: 9.2 x10E3/uL (ref 3.4–10.8)

## 2024-05-08 LAB — BASIC METABOLIC PANEL WITH GFR
BUN/Creatinine Ratio: 20 (ref 10–24)
BUN: 18 mg/dL (ref 8–27)
CO2: 25 mmol/L (ref 20–29)
Calcium: 9.7 mg/dL (ref 8.6–10.2)
Chloride: 99 mmol/L (ref 96–106)
Creatinine, Ser: 0.9 mg/dL (ref 0.76–1.27)
Glucose: 94 mg/dL (ref 70–99)
Potassium: 4.4 mmol/L (ref 3.5–5.2)
Sodium: 139 mmol/L (ref 134–144)
eGFR: 89 mL/min/1.73 (ref >59)

## 2024-05-14 ENCOUNTER — Other Ambulatory Visit: Payer: Self-pay | Admitting: Cardiology

## 2024-05-18 ENCOUNTER — Encounter: Payer: Self-pay | Admitting: Physician Assistant

## 2024-05-18 ENCOUNTER — Ambulatory Visit: Admitting: Physician Assistant

## 2024-05-18 VITALS — BP 100/56 | HR 71 | Ht 73.0 in | Wt 204.4 lb

## 2024-05-18 DIAGNOSIS — Z1211 Encounter for screening for malignant neoplasm of colon: Secondary | ICD-10-CM

## 2024-05-18 DIAGNOSIS — I480 Paroxysmal atrial fibrillation: Secondary | ICD-10-CM

## 2024-05-18 DIAGNOSIS — Z7901 Long term (current) use of anticoagulants: Secondary | ICD-10-CM

## 2024-05-18 MED ORDER — NA SULFATE-K SULFATE-MG SULF 17.5-3.13-1.6 GM/177ML PO SOLN: 1.0000 | Freq: Once | ORAL | 0 refills | Status: AC

## 2024-05-18 NOTE — Progress Notes (Signed)
 [name] Console, PA-C [address] [place], [place]  [address] Phone: [phone]   Gastroenterology Consultation  Referring Provider:     Micheal Wolm ORN, MD Primary Care Physician:  Micheal Wolm ORN, MD Primary Gastroenterologist:  [name] Console, PA-C / Glendia Holt, MD  Reason for Consultation:     Discuss repeat colonoscopy        HPI:   Discussed the use of AI scribe software for clinical note transcription with the patient, who gave verbal consent to proceed.  History of Present Illness [name] [name] is a 73 year old male who presents for a routine colonoscopy. He was referred by a Medicare wellness visit for a routine colonoscopy.  His first screening colonoscopy was in April 2004 at [facility] (by Dr. Jakie), which was normal with no polyps. He recalls having a second colonoscopy around 2014, possibly with polyps, by Dr. Rollin or Dr. Kristie, and we are requesting records.  He states he is overdue for a 10 year repeat screening Colonoscopy.  He denies any gastrointestinal symptoms such as blood in stool, diarrhea, or constipation. No family history of colon cancer.  He has a history of atrial fibrillation, managed with Eliquis , and has undergone two ablations. He is under the care of cardiologists for atrial fibrillation and a heart valve issue, requiring annual follow-up. He had a cardioversion on March 31, 2024. He denies any symptoms of Afib at present.  He denies chest pain, shortness of breath, or heart palpitations.  No health concerns today.  10/2002 Colonoscopy by Dr. Jakie: Normal.  No polyps.  Small internal hemorrhoids.  PMH: CAD s/p CABG (2005), atrial fibrillation, hypertension, atrial flutter ablation 2015, sleep apnea, GERD, gout, psoriatic arthritis.  Currently on Eliquis .  07/2022 TTE LVEF 65%.  He underwent cardioversion 03/31/2024 by Dr. Caleen through [facility] cardiology.  He is followed by Dr. Melchor at [facility].  05/07/2024  labs: Normal CBC, Hgb 15.5.  Normal BMP.  Past Medical History:  Diagnosis Date   A-fib (HCC) 03/2024   Arthritis    Chicken pox    Coronary artery disease    Severe three-vessel coronary artery disease with ejection fraction of 65%   GERD (gastroesophageal reflux disease)    History of gout    History of psoriasis    Hypercholesterolemia    Hypertension    OSA (obstructive sleep apnea)    Moderate with AHI of 16.9/hr now on CPAP at 10cm H2O   Psoriatic arthritis (HCC)    Skin cancer     Past Surgical History:  Procedure Laterality Date   ablasion     ATRIAL FLUTTER ABLATION  03/30/2014   ATRIAL FLUTTER ABLATION N/A 03/30/2014   Procedure: ATRIAL FLUTTER ABLATION;  Surgeon: Lynwood JONETTA Kelsie, MD;  Location: [facility] CATH LAB;  Service: Cardiovascular;  Laterality: N/A;   CARDIAC CATHETERIZATION  09/21/2003   Ejection fraction was estimated at 65%.   CARDIOVERSION N/A 03/15/2014   Procedure: CARDIOVERSION;  Surgeon: Redell GORMAN Shallow, MD;  Location: [facility] ENDOSCOPY;  Service: Cardiovascular;  Laterality: N/A;   CARDIOVERSION N/A 07/17/2017   Procedure: CARDIOVERSION;  Surgeon: Shlomo Wilbert SAUNDERS, MD;  Location: [facility] ENDOSCOPY;  Service: Cardiovascular;  Laterality: N/A;   CORONARY ARTERY BYPASS GRAFT  09/21/2003   Lima-lad,svg-diag,svg-om/distal LCX,svg-pda   TEE WITHOUT CARDIOVERSION N/A 03/15/2014   Procedure: TRANSESOPHAGEAL ECHOCARDIOGRAM (TEE);  Surgeon: Redell GORMAN Shallow, MD;  Location: [facility] ENDOSCOPY;  Service: Cardiovascular;  Laterality: N/A;   TEE WITHOUT CARDIOVERSION  N/A 07/17/2017   Procedure: TRANSESOPHAGEAL ECHOCARDIOGRAM (TEE);  Surgeon: Shlomo Wilbert SAUNDERS, MD;  Location: [facility] ENDOSCOPY;  Service: Cardiovascular;  Laterality: N/A;   TONSILLECTOMY  03/23/1951    Prior to Admission medications   Medication Sig Start Date End Date Taking? Authorizing Provider  acetaminophen  (TYLENOL ) 325 MG tablet Take 2 tablets (650 mg total) by mouth every 6 (six) hours as needed for up to 30 doses for  mild pain or moderate pain. 11/16/20   Cottie Donnice PARAS, MD  apixaban  (ELIQUIS ) 5 MG TABS tablet TAKE 1 TABLET BY MOUTH TWICE A DAY 04/29/24   Jordan, Peter M, MD  atorvastatin  (LIPITOR) 80 MG tablet TAKE 1 TABLET BY MOUTH EVERY DAY 01/15/24   Jordan, Peter M, MD  cholecalciferol  (VITAMIN D) 1000 UNITS tablet Take 1,000 Units by mouth daily.    [provider]  Coenzyme Q10 (CO Q 10 PO) Take 100 mg by mouth daily.     [provider]  colchicine  0.6 MG tablet TAKE TWO AT ONSET OF GOUT AND THEN ONE BY MOUTH 2 TIMES DAILY AS NEEDED 02/26/24   Burchette, Wolm ORN, MD  furosemide  (LASIX ) 20 MG tablet TAKE 1 TABLET BY MOUTH TWICE A DAY 02/27/24   Jordan, Peter M, MD  losartan  (COZAAR ) 50 MG tablet TAKE 1 TABLET BY MOUTH EVERY DAY 05/18/24   Jordan, Peter M, MD  metoprolol  succinate (TOPROL -XL) 50 MG 24 hr tablet Take 1 tablet by mouth daily. 11/05/22   [provider]  multivitamin [facility]) per tablet Take 1 tablet by mouth daily.    [provider]  nitroGLYCERIN  (NITROSTAT ) 0.4 MG SL tablet PLACE 1 TABLET UNDER THE TONGUE EVERY 5 MINUTES AS NEEDED FOR CHEST PAIN. 05/17/23   Jordan, Peter M, MD  potassium chloride  SA (K-DUR,KLOR-CON ) 20 MEQ tablet Take 1 tablet (20 mEq total) by mouth daily. 07/08/17   Jordan, Peter M, MD  sildenafil  (VIAGRA ) 100 MG tablet Take 1 tablet (100 mg total) by mouth daily as needed for erectile dysfunction. 12/24/23   Burchette, Wolm ORN, MD  SKYRIZI PEN 150 MG/ML pen  11/18/23   [provider]    Family History  Problem Relation Age of Onset   Heart disease Mother        cabg   Cancer Mother        breast   Stroke Mother      Social History   Tobacco Use   Smoking status: Former    Current packs/day: 0.50    Average packs/day: 0.5 packs/day for 5.0 years (2.5 ttl pk-yrs)    Types: Cigarettes   Smokeless tobacco: Never   Tobacco comments:    quit smoking cigarettes in the late 1970's  Vaping Use   Vaping status: Never  Used  Substance Use Topics   Alcohol use: Yes    Alcohol/week: 4.0 standard drinks of alcohol    Types: 2 Cans of beer, 2 Shots of liquor per week    Comment: 3 times per week, 2-3 drinks each time   Drug use: No    Allergies as of 05/18/2024   (No Known Allergies)    Review of Systems:    All systems reviewed and negative except where noted in HPI.   Physical Exam:  BP (!) 100/56   Pulse 71   Ht 6' 1 (1.854 m) Comment: measured without shoes  Wt 204 lb 6 oz (92.7 kg)   BMI 26.96 kg/m  No LMP for male patient.  General:  Alert,  Well-developed, well-nourished, pleasant and cooperative in NAD Lungs:  Respirations even and unlabored.  Clear throughout to auscultation.   No wheezes, crackles, or rhonchi. No acute distress. Heart:  Regular rate and rhythm; no murmurs, clicks, rubs, or gallops.  He is not in A-fib right now.  Normal sinus rhythm. Abdomen:  Normal bowel sounds.  No bruits.  Soft, and non-distended without masses, hepatosplenomegaly or hernias noted.  No Tenderness.  No guarding or rebound tenderness.    Neurologic:  Alert and oriented x3;  grossly normal neurologically. Psych:  Alert and cooperative. Normal mood and affect.   Imaging Studies: No results found.  Labs: CBC    Component Value Date/Time   WBC 9.2 05/07/2024 1128   WBC 5.1 03/06/2022 0909   RBC 4.97 05/07/2024 1128   RBC 4.82 03/06/2022 0909   HGB 15.5 05/07/2024 1128   HCT 46.8 05/07/2024 1128   PLT 166 05/07/2024 1128   MCV 94 05/07/2024 1128    CMP     Component Value Date/Time   NA 139 05/07/2024 1128   K 4.4 05/07/2024 1128   CL 99 05/07/2024 1128   CO2 25 05/07/2024 1128   GLUCOSE 94 05/07/2024 1128   GLUCOSE 97 03/06/2022 0909   BUN 18 05/07/2024 1128   CREATININE 0.90 05/07/2024 1128   CREATININE 0.79 02/26/2020 0916   CALCIUM  9.7 05/07/2024 1128   PROT 6.6 03/06/2022 0909   PROT 6.4 11/30/2019 0827   ALBUMIN 4.5 03/06/2022 0909   ALBUMIN 4.6 11/30/2019 0827   AST 20  03/06/2022 0909   ALT 16 03/06/2022 0909   ALKPHOS 74 03/06/2022 0909   BILITOT 1.1 03/06/2022 0909   BILITOT 1.2 11/30/2019 0827   GFRNONAA >60 10/19/2020 1450   GFRAA 90 08/27/2019 0909    Assessment and Plan:   AYDEN HARDWICK is a 73 y.o. y/o male has been referred for:  1.  Colon cancer screening: Patient is overdue for a 10-year repeat screening colonoscopy.   Colonoscopy in 2004 showed no polyps.  Colonoscopy in 2014 showed possible polyps, records unavailable (requesting report). - Scheduling Colonoscopy I discussed risks of colonoscopy with patient to include risk of bleeding, colon perforation, and risk of sedation.  Patient expressed understanding and agrees to proceed with colonoscopy.  - Send medical release form to obtain previous colonoscopy records from Dr. Rollin or Dr. Kristie. - Schedule colonoscopy for December (3 months post cardioversion), pending cardiologist's approval. - Send prep instructions to pharmacy for Suprep liquid preparation. - Hold Eliquis  two days before the procedure, pending cardiologist's approval.  2.  Atrial fibrillation on anticoagulation with Eliquis :  Recent cardioversion on September 9th. Under care of [facility], specifically Dr. Melchor. No current symptoms. Requires cardiologist's approval to hold Eliquis  before colonoscopy. Typically, a three-month wait is recommended after cardiac interventions before holding anticoagulation. - Send message to Doctor Daubert at [facility] to get permission to hold Eliquis  2 days before colonoscopy.   Follow up pending colonoscopy results and GI symptoms.  [name] Console, PA-C

## 2024-05-18 NOTE — Patient Instructions (Signed)
 You have been scheduled for a Colonoscopy. Please follow written instructions given to you at your visit today.   If you use inhalers (even only as needed), please bring them with you on the day of your procedure.  DO NOT TAKE 7 DAYS PRIOR TO TEST- Trulicity (dulaglutide) Ozempic, Wegovy (semaglutide) Mounjaro (tirzepatide) Bydureon Bcise (exanatide extended release)  DO NOT TAKE 1 DAY PRIOR TO YOUR TEST Rybelsus (semaglutide) Adlyxin (lixisenatide) Victoza (liraglutide) Byetta (exanatide) ___________________________________________________________________________  Please follow up sooner if symptoms increase or worsen   Due to recent changes in healthcare laws, you may see the results of your imaging and laboratory studies on MyChart before your provider has had a chance to review them.  We understand that in some cases there may be results that are confusing or concerning to you. Not all laboratory results come back in the same time frame and the provider may be waiting for multiple results in order to interpret others.  Please give us  48 hours in order for your provider to thoroughly review all the results before contacting the office for clarification of your results.   Thank you for trusting me with your gastrointestinal care!   Ellouise Console, PA-C _______________________________________________________  If your blood pressure at your visit was 140/90 or greater, please contact your primary care physician to follow up on this.  _______________________________________________________  If you are age 23 or older, your body mass index should be between 23-30. Your Body mass index is 26.96 kg/m. If this is out of the aforementioned range listed, please consider follow up with your Primary Care Provider.  If you are age 3 or younger, your body mass index should be between 19-25. Your Body mass index is 26.96 kg/m. If this is out of the aformentioned range listed, please consider  follow up with your Primary Care Provider.   ________________________________________________________  The [facility] providers would like to encourage you to use MYCHART to communicate with providers for non-urgent requests or questions.  Due to long hold times on the telephone, sending your provider a message by [facility] may be a faster and more efficient way to get a response.  Please allow 48 business hours for a response.  Please remember that this is for non-urgent requests.  _______________________________________________________

## 2024-05-19 ENCOUNTER — Telehealth: Payer: Self-pay | Admitting: Physician Assistant

## 2024-05-19 NOTE — Telephone Encounter (Signed)
 Notify patient I received his last colonoscopy done by Dr. Kristie 11/2011 which showed 2 small benign hyperplastic polyps removed from rectosigmoid colon.  Small nonbleeding internal hemorrhoids.  Otherwise normal.  10-year repeat colonoscopy was recommended (due 11/2021).  Continue with plan for repeat colonoscopy as scheduled. Ellouise Console, PA-C

## 2024-05-28 ENCOUNTER — Emergency Department (HOSPITAL_BASED_OUTPATIENT_CLINIC_OR_DEPARTMENT_OTHER)

## 2024-05-28 ENCOUNTER — Observation Stay (HOSPITAL_BASED_OUTPATIENT_CLINIC_OR_DEPARTMENT_OTHER)
Admission: EM | Admit: 2024-05-28 | Discharge: 2024-05-30 | Disposition: A | Discharge status: Home / Self Care | Attending: Family Medicine | Admitting: Family Medicine

## 2024-05-28 ENCOUNTER — Other Ambulatory Visit: Payer: Self-pay

## 2024-05-28 DIAGNOSIS — G459 Transient cerebral ischemic attack, unspecified: Secondary | ICD-10-CM | POA: Diagnosis not present

## 2024-05-28 DIAGNOSIS — Z7901 Long term (current) use of anticoagulants: Secondary | ICD-10-CM | POA: Diagnosis not present

## 2024-05-28 DIAGNOSIS — I2510 Atherosclerotic heart disease of native coronary artery without angina pectoris: Secondary | ICD-10-CM | POA: Diagnosis not present

## 2024-05-28 DIAGNOSIS — I10 Essential (primary) hypertension: Secondary | ICD-10-CM | POA: Diagnosis not present

## 2024-05-28 DIAGNOSIS — Z87891 Personal history of nicotine dependence: Secondary | ICD-10-CM | POA: Insufficient documentation

## 2024-05-28 DIAGNOSIS — I4819 Other persistent atrial fibrillation: Secondary | ICD-10-CM | POA: Diagnosis present

## 2024-05-28 DIAGNOSIS — K219 Gastro-esophageal reflux disease without esophagitis: Secondary | ICD-10-CM | POA: Diagnosis not present

## 2024-05-28 DIAGNOSIS — Z951 Presence of aortocoronary bypass graft: Secondary | ICD-10-CM | POA: Diagnosis not present

## 2024-05-28 DIAGNOSIS — L4052 Psoriatic arthritis mutilans: Secondary | ICD-10-CM | POA: Insufficient documentation

## 2024-05-28 DIAGNOSIS — F1090 Alcohol use, unspecified, uncomplicated: Secondary | ICD-10-CM | POA: Diagnosis not present

## 2024-05-28 DIAGNOSIS — Z79899 Other long term (current) drug therapy: Secondary | ICD-10-CM | POA: Diagnosis not present

## 2024-05-28 DIAGNOSIS — R270 Ataxia, unspecified: Secondary | ICD-10-CM

## 2024-05-28 DIAGNOSIS — G4733 Obstructive sleep apnea (adult) (pediatric): Secondary | ICD-10-CM | POA: Diagnosis not present

## 2024-05-28 DIAGNOSIS — L4050 Arthropathic psoriasis, unspecified: Secondary | ICD-10-CM | POA: Diagnosis present

## 2024-05-28 DIAGNOSIS — M109 Gout, unspecified: Secondary | ICD-10-CM | POA: Diagnosis present

## 2024-05-28 DIAGNOSIS — E7800 Pure hypercholesterolemia, unspecified: Secondary | ICD-10-CM | POA: Diagnosis present

## 2024-05-28 DIAGNOSIS — R29818 Other symptoms and signs involving the nervous system: Secondary | ICD-10-CM | POA: Diagnosis not present

## 2024-05-28 DIAGNOSIS — R2681 Unsteadiness on feet: Secondary | ICD-10-CM | POA: Diagnosis present

## 2024-05-28 LAB — CBG MONITORING, ED: Glucose-Capillary: 93 mg/dL (ref 70–99)

## 2024-05-28 NOTE — ED Notes (Signed)
 Roselyn MD at bedside with this RN to evaluate pt

## 2024-05-28 NOTE — ED Notes (Addendum)
 Patient transported to CT

## 2024-05-28 NOTE — ED Provider Notes (Signed)
 [place] EMERGENCY DEPARTMENT AT [facility]  Provider Note  CSN: [id number] Arrival date & time: 05/28/24 2300  History Chief Complaint  Patient presents with   [name] [name] is a 73 y.o. male with history of CAD, afib on Eliquis  and provisional diagnosis of Parkinson's (based on hand tremor) reports he lost his balance around 2100hrs and fell at home onto his bottom, did not have a head injury. He was unable to get off the floor, wife called EMS who helped him up and even afterwards he was having some difficulty walking in the house and feeling off balance. He and wife eventually decided to come to the ED for evaluation. He has otherwise been in his usual state of health. No prior balance issues related to his possible Parkinsons. Denies CP, SOB, recent illness.    Home Medications Prior to Admission medications   Medication Sig Start Date End Date Taking? Authorizing Provider  acetaminophen  (TYLENOL ) 325 MG tablet Take 2 tablets (650 mg total) by mouth every 6 (six) hours as needed for up to 30 doses for mild pain or moderate pain. 11/16/20   Cottie Donnice PARAS, MD  apixaban  (ELIQUIS ) 5 MG TABS tablet TAKE 1 TABLET BY MOUTH TWICE A DAY 04/29/24   Jordan, Peter M, MD  atorvastatin  (LIPITOR) 80 MG tablet TAKE 1 TABLET BY MOUTH EVERY DAY 01/15/24   Jordan, Peter M, MD  cholecalciferol  (VITAMIN D) 1000 UNITS tablet Take 1,000 Units by mouth daily.    [provider]  Coenzyme Q10 (CO Q 10 PO) Take 100 mg by mouth daily.     [provider]  colchicine  0.6 MG tablet TAKE TWO AT ONSET OF GOUT AND THEN ONE BY MOUTH 2 TIMES DAILY AS NEEDED 02/26/24   Burchette, Wolm ORN, MD  furosemide  (LASIX ) 20 MG tablet TAKE 1 TABLET BY MOUTH TWICE A DAY 02/27/24   Jordan, Peter M, MD  losartan  (COZAAR ) 50 MG tablet TAKE 1 TABLET BY MOUTH EVERY DAY 05/18/24   Jordan, Peter M, MD  metoprolol  succinate (TOPROL -XL) 50 MG 24 hr tablet Take 1 tablet by mouth daily. 11/05/22    [provider]  multivitamin [facility]) per tablet Take 1 tablet by mouth daily.    [provider]  nitroGLYCERIN  (NITROSTAT ) 0.4 MG SL tablet PLACE 1 TABLET UNDER THE TONGUE EVERY 5 MINUTES AS NEEDED FOR CHEST PAIN. 05/17/23   Jordan, Peter M, MD  potassium chloride  SA (K-DUR,KLOR-CON ) 20 MEQ tablet Take 1 tablet (20 mEq total) by mouth daily. 07/08/17   Jordan, Peter M, MD  sildenafil  (VIAGRA ) 100 MG tablet Take 1 tablet (100 mg total) by mouth daily as needed for erectile dysfunction. 12/24/23   Micheal Wolm ORN, MD  SKYRIZI PEN 150 MG/ML pen  11/18/23   [provider]     Allergies    Patient has no known allergies.   Review of Systems   Review of Systems Please see HPI for pertinent positives and negatives  Physical Exam BP (!) 140/73 (BP Location: Right Arm)   Pulse 65   Temp 97.6 F (36.4 C)   Resp 18   Ht 6' 1 (1.854 m)   Wt 90.7 kg   SpO2 97%   BMI 26.39 kg/m   Physical Exam Vitals and nursing note reviewed.  Constitutional:      Appearance: Normal appearance.  HENT:     Head: Normocephalic and atraumatic.     Nose: Nose normal.  Mouth/Throat:     Mouth: Mucous membranes are moist.  Eyes:     Extraocular Movements: Extraocular movements intact.     Conjunctiva/sclera: Conjunctivae normal.  Cardiovascular:     Rate and Rhythm: Normal rate.  Pulmonary:     Effort: Pulmonary effort is normal.     Breath sounds: Normal breath sounds.  Abdominal:     General: Abdomen is flat.     Palpations: Abdomen is soft.     Tenderness: There is no abdominal tenderness.  Musculoskeletal:        General: No swelling. Normal range of motion.     Cervical back: Neck supple.  Skin:    General: Skin is warm and dry.  Neurological:     General: No focal deficit present.     Mental Status: He is alert and oriented to person, place, and time.     Cranial Nerves: No cranial nerve deficit.     Sensory: No sensory deficit.     Motor: No  weakness.     Coordination: Coordination normal.     Gait: Gait normal.  Psychiatric:        Mood and Affect: Mood normal.     ED Results / Procedures / Treatments   EKG None  Procedures Procedures  Medications Ordered in the ED Medications - No data to display  Initial Impression and Plan  Patient here with a fall and some reported ataxia onset around 2100hrs. He is feeling better and has a steady gait now, but given he is in potential treatment window for stroke, will activate a Code Stroke and send for CT.   ED Course       MDM Rules/Calculators/A&P Medical Decision Making Amount and/or Complexity of Data Reviewed Labs: ordered. Radiology: ordered.     Final Clinical Impression(s) / ED Diagnoses Final diagnoses:  None    Rx / DC Orders ED Discharge Orders     None

## 2024-05-28 NOTE — ED Notes (Signed)
Pt transported to CT by this RN.

## 2024-05-28 NOTE — ED Triage Notes (Signed)
 Pt POV reporting fall, was standing, lost balance and fell backwards, landed on bottom, on Eliquis . Endorses some balance issues tonight, last known normal 1700 when he got home. Hx Parkinsons.

## 2024-05-29 ENCOUNTER — Encounter (HOSPITAL_BASED_OUTPATIENT_CLINIC_OR_DEPARTMENT_OTHER): Payer: Self-pay | Admitting: Family Medicine

## 2024-05-29 ENCOUNTER — Observation Stay (HOSPITAL_COMMUNITY)

## 2024-05-29 DIAGNOSIS — R531 Weakness: Secondary | ICD-10-CM | POA: Diagnosis not present

## 2024-05-29 DIAGNOSIS — I4819 Other persistent atrial fibrillation: Secondary | ICD-10-CM

## 2024-05-29 DIAGNOSIS — Z951 Presence of aortocoronary bypass graft: Secondary | ICD-10-CM

## 2024-05-29 DIAGNOSIS — I25810 Atherosclerosis of coronary artery bypass graft(s) without angina pectoris: Secondary | ICD-10-CM

## 2024-05-29 DIAGNOSIS — I10 Essential (primary) hypertension: Secondary | ICD-10-CM

## 2024-05-29 DIAGNOSIS — L4050 Arthropathic psoriasis, unspecified: Secondary | ICD-10-CM | POA: Diagnosis not present

## 2024-05-29 DIAGNOSIS — K219 Gastro-esophageal reflux disease without esophagitis: Secondary | ICD-10-CM | POA: Diagnosis not present

## 2024-05-29 DIAGNOSIS — Z743 Need for continuous supervision: Secondary | ICD-10-CM | POA: Diagnosis not present

## 2024-05-29 DIAGNOSIS — G459 Transient cerebral ischemic attack, unspecified: Principal | ICD-10-CM

## 2024-05-29 DIAGNOSIS — R0689 Other abnormalities of breathing: Secondary | ICD-10-CM | POA: Diagnosis not present

## 2024-05-29 DIAGNOSIS — G4733 Obstructive sleep apnea (adult) (pediatric): Secondary | ICD-10-CM | POA: Diagnosis not present

## 2024-05-29 DIAGNOSIS — R270 Ataxia, unspecified: Secondary | ICD-10-CM | POA: Diagnosis not present

## 2024-05-29 DIAGNOSIS — I6523 Occlusion and stenosis of bilateral carotid arteries: Secondary | ICD-10-CM | POA: Diagnosis not present

## 2024-05-29 DIAGNOSIS — E7800 Pure hypercholesterolemia, unspecified: Secondary | ICD-10-CM

## 2024-05-29 LAB — COMPREHENSIVE METABOLIC PANEL WITH GFR
ALT: 27 U/L (ref 0–44)
AST: 33 U/L (ref 15–41)
Albumin: 4.5 g/dL (ref 3.5–5.0)
Alkaline Phosphatase: 92 U/L (ref 38–126)
Anion gap: 14 (ref 5–15)
BUN: 11 mg/dL (ref 8–23)
CO2: 27 mmol/L (ref 22–32)
Calcium: 9.9 mg/dL (ref 8.9–10.3)
Chloride: 101 mmol/L (ref 98–111)
Creatinine, Ser: 0.85 mg/dL (ref 0.61–1.24)
GFR, Estimated: >60 mL/min (ref >60)
Glucose, Bld: 101 mg/dL — ABNORMAL HIGH (ref 70–99)
Potassium: 4 mmol/L (ref 3.5–5.1)
Sodium: 141 mmol/L (ref 135–145)
Total Bilirubin: 0.8 mg/dL (ref 0.0–1.2)
Total Protein: 6.9 g/dL (ref 6.5–8.1)

## 2024-05-29 LAB — CBC
HCT: 43.3 % (ref 39.0–52.0)
Hemoglobin: 15 g/dL (ref 13.0–17.0)
MCH: 31.2 pg (ref 26.0–34.0)
MCHC: 34.6 g/dL (ref 30.0–36.0)
MCV: 90 fL (ref 80.0–100.0)
Platelets: 154 K/uL (ref 150–400)
RBC: 4.81 MIL/uL (ref 4.22–5.81)
RDW: 12.3 % (ref 11.5–15.5)
WBC: 8.9 K/uL (ref 4.0–10.5)
nRBC: 0 % (ref 0.0–0.2)

## 2024-05-29 LAB — DIFFERENTIAL
Abs Immature Granulocytes: 0.02 K/uL (ref 0.00–0.07)
Basophils Absolute: 0 K/uL (ref 0.0–0.1)
Basophils Relative: 0 %
Eosinophils Absolute: 0.1 K/uL (ref 0.0–0.5)
Eosinophils Relative: 1 %
Immature Granulocytes: 0 %
Lymphocytes Relative: 17 %
Lymphs Abs: 1.5 K/uL (ref 0.7–4.0)
Monocytes Absolute: 0.5 K/uL (ref 0.1–1.0)
Monocytes Relative: 6 %
Neutro Abs: 6.7 K/uL (ref 1.7–7.7)
Neutrophils Relative %: 76 %

## 2024-05-29 LAB — PROTIME-INR
INR: 1 (ref 0.8–1.2)
Prothrombin Time: 13.9 s (ref 11.4–15.2)

## 2024-05-29 LAB — APTT: aPTT: 31 s (ref 24–36)

## 2024-05-29 MED ORDER — SENNOSIDES-DOCUSATE SODIUM 8.6-50 MG PO TABS: 1.0000 | ORAL_TABLET | Freq: Every evening | ORAL | Status: DC | PRN

## 2024-05-29 MED ORDER — ACETAMINOPHEN 650 MG RE SUPP: 650.0000 mg | RECTAL | Status: DC | PRN

## 2024-05-29 MED ORDER — METOPROLOL SUCCINATE ER 50 MG PO TB24
50.0000 mg | ORAL_TABLET | Freq: Every day | ORAL | Status: DC
  Administered 2024-05-30: 50 mg via ORAL
  Filled 2024-05-29: qty 1

## 2024-05-29 MED ORDER — ACETAMINOPHEN 160 MG/5ML PO SOLN: 650.0000 mg | ORAL | Status: DC | PRN

## 2024-05-29 MED ORDER — APIXABAN 5 MG PO TABS
5.0000 mg | ORAL_TABLET | Freq: Two times a day (BID) | ORAL | Status: DC
  Administered 2024-05-29 – 2024-05-30 (×2): 5 mg via ORAL
  Filled 2024-05-29 (×2): qty 1

## 2024-05-29 MED ORDER — SODIUM CHLORIDE 0.9 % IV SOLN: INTRAVENOUS | Status: DC

## 2024-05-29 MED ORDER — COLCHICINE 0.6 MG PO TABS
0.6000 mg | ORAL_TABLET | Freq: Two times a day (BID) | ORAL | Status: DC | PRN
  Administered 2024-05-29 – 2024-05-30 (×2): 0.6 mg via ORAL
  Filled 2024-05-29 (×3): qty 1

## 2024-05-29 MED ORDER — IOHEXOL 350 MG/ML SOLN
150.0000 mL | Freq: Once | INTRAVENOUS | Status: AC | PRN
  Administered 2024-05-29: 150 mL via INTRAVENOUS

## 2024-05-29 MED ORDER — STROKE: EARLY STAGES OF RECOVERY BOOK
Freq: Once | Status: AC
  Filled 2024-05-29: qty 1

## 2024-05-29 MED ORDER — ATORVASTATIN CALCIUM 80 MG PO TABS
80.0000 mg | ORAL_TABLET | Freq: Every day | ORAL | Status: DC
  Administered 2024-05-29 – 2024-05-30 (×2): 80 mg via ORAL
  Filled 2024-05-29 (×2): qty 1

## 2024-05-29 MED ORDER — ACETAMINOPHEN 325 MG PO TABS: 650.0000 mg | ORAL_TABLET | ORAL | Status: DC | PRN

## 2024-05-29 NOTE — ED Notes (Signed)
 Pt ambulating down the hall well and independently.

## 2024-05-29 NOTE — H&P (Signed)
 History and Physical   [name] [name] FMW:[id number] DOB: 1951-02-05 DOA: 05/28/2024  PCP: Micheal Wolm ORN, MD   Patient coming from: Home  Chief Complaint: Fall/balance issues  HPI: [name] [name] is a 73 y.o. male with medical history significant of hypertension, hyperlipidemia, GERD, atrial fibrillation, CAD status post CABG, psoriatic arthritis, OSA, gout presenting after a fall and subsequent balance issues.  Patient had a fall yesterday evening and had continued balance issues after that fall.  Did not hit his head during the fall.  EMS came to help patient get up and ultimately patient was transported to the ED for further evaluation.  Return to baseline in the ED.  Denies fevers, chills, chest pain, shortness of breath, abdominal pain, constipation, diarrhea, nausea, vomiting.  Reportedly is undergoing workup for Parkinson's disease outpatient.  ED Course: Vital signs in the ED notable for blood pressure in the 90s-140 systolic, heart rate in the 50s-70s.  Lab workup included CMP with glucose 101.  CBC within normal limits.  PT, PTT, INR within normal limits.  CT head showed no acute abnormality.  Teleneurology was consulted who recommended TIA/CVA workup and continue with home Eliquis .  Review of Systems: As per HPI otherwise all other systems reviewed and are negative.  Past Medical History:  Diagnosis Date   A-fib (HCC) 03/2024   Arthritis    Chicken pox    Coronary artery disease    Severe three-vessel coronary artery disease with ejection fraction of 65%   GERD (gastroesophageal reflux disease)    History of gout    History of psoriasis    Hypercholesterolemia    Hypertension    OSA (obstructive sleep apnea)    Moderate with AHI of 16.9/hr now on CPAP at 10cm H2O   Psoriatic arthritis (HCC)    Skin cancer     Past Surgical History:  Procedure Laterality Date   ablasion     ATRIAL FLUTTER ABLATION  03/30/2014   ATRIAL FLUTTER ABLATION N/A 03/30/2014    Procedure: ATRIAL FLUTTER ABLATION;  Surgeon: Lynwood JONETTA Rakers, MD;  Location: [facility] CATH LAB;  Service: Cardiovascular;  Laterality: N/A;   CARDIAC CATHETERIZATION  09/21/2003   Ejection fraction was estimated at 65%.   CARDIOVERSION N/A 03/15/2014   Procedure: CARDIOVERSION;  Surgeon: Redell GORMAN Shallow, MD;  Location: [facility] ENDOSCOPY;  Service: Cardiovascular;  Laterality: N/A;   CARDIOVERSION N/A 07/17/2017   Procedure: CARDIOVERSION;  Surgeon: Shlomo Wilbert SAUNDERS, MD;  Location: [facility] ENDOSCOPY;  Service: Cardiovascular;  Laterality: N/A;   CORONARY ARTERY BYPASS GRAFT  09/21/2003   Lima-lad,svg-diag,svg-om/distal LCX,svg-pda   TEE WITHOUT CARDIOVERSION N/A 03/15/2014   Procedure: TRANSESOPHAGEAL ECHOCARDIOGRAM (TEE);  Surgeon: Redell GORMAN Shallow, MD;  Location: [facility] ENDOSCOPY;  Service: Cardiovascular;  Laterality: N/A;   TEE WITHOUT CARDIOVERSION N/A 07/17/2017   Procedure: TRANSESOPHAGEAL ECHOCARDIOGRAM (TEE);  Surgeon: Shlomo Wilbert SAUNDERS, MD;  Location: [facility] ENDOSCOPY;  Service: Cardiovascular;  Laterality: N/A;   TONSILLECTOMY  03/23/1951    Social History  reports that he has quit smoking. His smoking use included cigarettes. He has a 2.5 pack-year smoking history. He has never used smokeless tobacco. He reports current alcohol use of about 4.0 standard drinks of alcohol per week. He reports that he does not use drugs.  No Known Allergies  Family History  Problem Relation Age of Onset   Heart disease Mother        cabg   Cancer Mother        breast   Stroke Mother  Reviewed on admission  Prior to Admission medications   Medication Sig Start Date End Date Taking? Authorizing Provider  acetaminophen  (TYLENOL ) 325 MG tablet Take 2 tablets (650 mg total) by mouth every 6 (six) hours as needed for up to 30 doses for mild pain or moderate pain. 11/16/20  Yes Cottie Donnice PARAS, MD  apixaban  (ELIQUIS ) 5 MG TABS tablet TAKE 1 TABLET BY MOUTH TWICE A DAY 04/29/24  Yes Jordan, Peter M, MD  atorvastatin   (LIPITOR) 80 MG tablet TAKE 1 TABLET BY MOUTH EVERY DAY 01/15/24  Yes Jordan, Peter M, MD  cholecalciferol  (VITAMIN D) 1000 UNITS tablet Take 1,000 Units by mouth daily.   Yes [provider]  Coenzyme Q10 (CO Q 10 PO) Take 100 mg by mouth daily.    Yes [provider]  colchicine  0.6 MG tablet TAKE TWO AT ONSET OF GOUT AND THEN ONE BY MOUTH 2 TIMES DAILY AS NEEDED 02/26/24  Yes Burchette, Wolm ORN, MD  furosemide  (LASIX ) 20 MG tablet TAKE 1 TABLET BY MOUTH TWICE A DAY 02/27/24  Yes Jordan, Peter M, MD  losartan  (COZAAR ) 50 MG tablet TAKE 1 TABLET BY MOUTH EVERY DAY 05/18/24  Yes Jordan, Peter M, MD  metoprolol  succinate (TOPROL -XL) 50 MG 24 hr tablet Take 1 tablet by mouth daily. 11/05/22  Yes [provider]  multivitamin (THERAGRAN) per tablet Take 1 tablet by mouth daily.   Yes [provider]  potassium chloride  SA (K-DUR,KLOR-CON ) 20 MEQ tablet Take 1 tablet (20 mEq total) by mouth daily. 07/08/17  Yes Jordan, Peter M, MD  sildenafil  (VIAGRA ) 100 MG tablet Take 1 tablet (100 mg total) by mouth daily as needed for erectile dysfunction. 12/24/23  Yes Burchette, Wolm ORN, MD  SKYRIZI PEN 150 MG/ML pen Inject 150 mg into the skin every 3 (three) months. 11/18/23  Yes [provider]  nitroGLYCERIN  (NITROSTAT ) 0.4 MG SL tablet PLACE 1 TABLET UNDER THE TONGUE EVERY 5 MINUTES AS NEEDED FOR CHEST PAIN. 05/17/23   Jordan, Peter M, MD    Physical Exam: Vitals:   05/29/24 1045 05/29/24 1100 05/29/24 1300 05/29/24 1510  BP:  (!) 136/56 (!) 143/71 (!) 129/56  Pulse: 69 69 78 70  Resp: (!) 24 18 (!) 26 20  Temp:    98.9 F (37.2 C)  TempSrc:    Oral  SpO2: 95% 95% 97%   Weight:      Height:        Physical Exam Constitutional:      General: He is not in acute distress.    Appearance: Normal appearance.  HENT:     Head: Normocephalic and atraumatic.     Mouth/Throat:     Mouth: Mucous membranes are moist.     Pharynx: Oropharynx is clear.  Eyes:      Extraocular Movements: Extraocular movements intact.     Pupils: Pupils are equal, round, and reactive to light.  Cardiovascular:     Rate and Rhythm: Normal rate and regular rhythm.     Pulses: Normal pulses.     Heart sounds: Normal heart sounds.  Pulmonary:     Effort: Pulmonary effort is normal. No respiratory distress.     Breath sounds: Normal breath sounds.  Abdominal:     General: Bowel sounds are normal. There is no distension.     Palpations: Abdomen is soft.     Tenderness: There is no abdominal tenderness.  Musculoskeletal:        General: No swelling or deformity.  Skin:    General: Skin is warm and dry.  Neurological:     Comments: Mental Status: Patient is awake, alert, oriented x3 No signs of aphasia or neglect Cranial Nerves: II: Pupils equal, round, and reactive to light.   III,IV, VI: EOMI without ptosis or diploplia.  V: Facial sensation is symmetric to light touch. VII: Facial movement is symmetric.  VIII: hearing is intact to voice X: Uvula elevates symmetrically XI: Shoulder shrug is symmetric. XII: tongue is midline without atrophy or fasciculations.  Motor: Good effort thorughout, at Least 5/5 bilateral UE, 5/5 bilateral lower extremitiy  Sensory: Sensation is grossly intact bilateral UEs & LEs Cerebellar: Finger-Nose intact bilalat    Labs on Admission: I have personally reviewed following labs and imaging studies  CBC: Recent Labs  Lab 05/29/24 0017  WBC 8.9  NEUTROABS 6.7  HGB 15.0  HCT 43.3  MCV 90.0  PLT 154    Basic Metabolic Panel: Recent Labs  Lab 05/29/24 0017  NA 141  K 4.0  CL 101  CO2 27  GLUCOSE 101*  BUN 11  CREATININE 0.85  CALCIUM  9.9    GFR: Estimated Creatinine Clearance: 84.9 mL/min (by C-G formula based on SCr of 0.85 mg/dL).  Liver Function Tests: Recent Labs  Lab 05/29/24 0017  AST 33  ALT 27  ALKPHOS 92  BILITOT 0.8  PROT 6.9  ALBUMIN 4.5    Urine analysis: No results found for:  COLORURINE, APPEARANCEUR, LABSPEC, PHURINE, GLUCOSEU, HGBUR, BILIRUBINUR, KETONESUR, PROTEINUR, UROBILINOGEN, NITRITE, LEUKOCYTESUR  Radiological Exams on Admission: CT HEAD CODE STROKE WO CONTRAST (LKW 0-4.5h, LVO 0-24h) Result Date: 05/29/2024 EXAM: CT HEAD WITHOUT CONTRAST 05/28/2024 11:30:00 PM TECHNIQUE: CT of the head was performed without the administration of intravenous contrast. Automated exposure control, iterative reconstruction, and/or weight based adjustment of the mA/kV was utilized to reduce the radiation dose to as low as reasonably achievable. COMPARISON: None available. CLINICAL HISTORY: Neuro deficit, acute, stroke suspected. FINDINGS: BRAIN AND VENTRICLES: No acute hemorrhage. No evidence of acute infarct. No hydrocephalus. No extra-axial collection. No mass effect or midline shift. ORBITS: No acute abnormality. SINUSES: No acute abnormality. SOFT TISSUES AND SKULL: No skull fracture. Findings discussed with Dr. Roselyn at 12:39 PM.  The report was delayed due to technical issue causing study to not be on the list/ready to read initially. IMPRESSION: 1. No acute intracranial abnormality. Electronically signed by: Gilmore Molt MD 05/29/2024 12:40 AM EST RP Workstation: [id number]   EKG: Independently reviewed.  Sinus rhythm at 63 bpm.  Nonspecific T wave changes.  Nonspecific intraventricular conduction delay with QRS 124, most consistent with left bundle branch block.  Some minimal baseline wander.  Similar to previous.  Assessment/Plan Principal Problem:   TIA (transient ischemic attack) Active Problems:   Coronary artery disease   Hypercholesterolemia   S/P CABG (coronary artery bypass graft)   Gout   Hypertension   OSA (obstructive sleep apnea)   Persistent atrial fibrillation (HCC)   Psoriatic arthritis (HCC)   GERD (gastroesophageal reflux disease)   Balance issues Suspected TIA > Patient with a fall at home with subsequent continued  balance issues which have now resolved. > Concern for TIA versus CVA.  Neurology consulted in the ED and recommended admission with TIA versus CVA workup. > CT head showed no acute abnormality. - In house neurology notified of patient arrival - Permissive HTN (systolic < 220 and diastolic < 120)  - Hold off on ASA for now as patient is on Eliquis  - Continue  home atorvastatin  - Echocardiogram  - CTA head & neck   - MRI brain  - A1C  - Lipid panel  - Tele monitoring  - SLP eval - PT/OT  Hypertension - Holding antihypertensives in the setting of above  Atrial fibrillation - Metoprolol  held in the setting of permissive hypertension but will restart it tomorrow - Continue home Eliquis   CAD > Status post CABG - Holding losartan  and metoprolol  in the setting of above - Continue atorvastatin , Eliquis   Psoriatic arthritis - Takes care reviewed outpatient  OSA - Continue home CPAP  DVT prophylaxis: Eliquis  Code Status:   Full Family Communication:  Updated at bedside  Disposition Plan:   Patient is from:  Home  Anticipated DC to:  Home  Anticipated DC date:  1 to 2 days  Anticipated DC barriers: None  Consults called:  Neurology Admission status:  Observation, telemetry  Severity of Illness: The appropriate patient status for this patient is OBSERVATION. Observation status is judged to be reasonable and necessary in order to provide the required intensity of service to ensure the patient's safety. The patient's presenting symptoms, physical exam findings, and initial radiographic and laboratory data in the context of their medical condition is felt to place them at decreased risk for further clinical deterioration. Furthermore, it is anticipated that the patient will be medically stable for discharge from the hospital within 2 midnights of admission.    Marsa KATHEE Scurry MD [facility]  How to contact the [facility] Attending or Consulting provider 7A - 7P or covering  provider during after hours 7P -7A, for this patient?   Check the care team in [facility] and look for a) attending/consulting [facility] provider listed and b) the [facility] team listed Log into [email or web address] and use [place]'s universal password to access. If you do not have the password, please contact the hospital operator. Locate the [facility] provider you are looking for under [facility] and page to a number that you can be directly reached. If you still have difficulty reaching the provider, please page the [facility] (Director on Call) for the Hospitalists listed on amion for assistance.  05/29/2024, 4:27 PM

## 2024-05-29 NOTE — Progress Notes (Signed)
 Patient arrived on unit. Patient walked to bed. Patient oriented to room call bell within reach. Alarm on.

## 2024-05-29 NOTE — Progress Notes (Signed)
 Coming from [facility] ED with history of afib on eliquis , had a fall and then a few hours of ataxia this evening. Code Stroke activated on arrival, but NIH 0. Neuro recommend TIA evaluation. He has a provisional diagnosis of Parkinsons based on hand tremor only (one neurologist said he had it, another said he didn't) but he's never had any balance problems.  Telemetry order at [facility] was placed.

## 2024-05-29 NOTE — ED Notes (Signed)
 Pt took home dose of Eliquis  and Losartan .

## 2024-05-29 NOTE — ED Notes (Signed)
 Pt A/Ox4, RR equal and unlabored, on heart monitor. No complaints at this time. Awaiting bed assignment at [facility].

## 2024-05-29 NOTE — Consult Note (Signed)
 [facility] Consult Services   Patient Name:   [name] [name], [name] [name] Date of Birth:   [birth year] Date of Service:   05/28/2024 23:22:01  Diagnosis:       G45.9 - Transient cerebral ischemic attack, unspecified  Impression:      73yoM hx of CAD s/p bypass, Afib on Eliquis  s/p ablation and cardioversion, possible Parkinson disease (not on med), psoriasis present with fall and cannot get up from floor. After being helped up he continued to felt imbalance. CT head neg for acute finding. On exam patient has NIHSS 0 and reports ambulation back to baseline. Not a thrombolytics candidate due to resolved symptoms and currently on Eliquis . Presentation concerning for TIA.    Recommendation  - Monitor neuro checks/VS q4h with telemetry.  - Fall precautions  - Permissive HTN for 24hr  - Continue Eliquis  home dose  - MR brain w/o  - Lipid panel (Goal LDL <70)  - HgA1c (goal <7%)  - TTE w/ bubble  - Workup for toxic/metabolic/infectious cause.  - PT/OT/ST eval  Our recommendations are outlined below.  Recommendations:        Stroke/Telemetry Floor       Neuro Checks       Bedside Swallow Eval       DVT Prophylaxis       IV Fluids, Normal Saline       Head of Bed 30 Degrees       Euglycemia and Avoid Hyperthermia (PRN Acetaminophen )  Sign Out:       Discussed with Emergency Department Provider    ------------------------------------------------------------------------------  Advanced Imaging: Advanced Imaging Deferred because:  Non-disabling symptoms as verified by the patient; no cortical signs so not consistent with LVO   Metrics: Last Known Well: 05/28/2024 19:00:00 Dispatch Time: 05/28/2024 23:22:01 Arrival Time: 05/28/2024 23:00:00 Initial Response Time: 05/28/2024 23:30:45 Symptoms: feeling off balance . Initial patient interaction: 05/28/2024 23:31:43 NIHSS Assessment Completed: 05/28/2024 23:43:00 Patient is not a candidate for  Thrombolytic. Thrombolytic Medical Decision: 05/28/2024 23:43:01 Patient was not deemed candidate for Thrombolytic because of following reasons: Resolved symptoms .  CT Head: I personally reviewed all the CT images that were available to me and it showed: no acute finding  Primary Provider Notified of Diagnostic Impression and Management Plan on: 05/29/2024 00:04:21    ------------------------------------------------------------------------------  History of Present Illness: Patient is a 73 year old Male.  Patient was brought by private transportation with symptoms of feeling off balance . Patient said he last felt normal around 6-7PM. Around 7-8PM he was getting ready for bed when he felt off balance, step backward and fell. He was having trouble getting off the floor. Wife called ambulance and he was help off the floor by paramedic after being down for 15-14min. While getting ready for bed again patient told wife he still felt off balance but unable to give more details, wife decided to bring him to hospital. During teleneuro eval patient was able to ambulate and said he felt back to normal.  Patient said at baseline he is able to get up from the floor without any issue. He works out regularly. While on the floor he felt leg weakness, possible the right side since that is the leg he usually use to get up.  Last Eliquis  use was 11/6 AM.     Past Medical History:      Atrial Fibrillation      There is no history of Hypertension      There is no history  of Diabetes Mellitus Other PMH:  CAD s/p bypass, Afib on Eliquis  s/p ablation and cardioversion, possible Parkinson disease (not on med), psoriasis  Medications:  Anticoagulant use:  Yes Eliquis  (last dose this morning) No Antiplatelet use Reviewed EMR for current medications  Allergies:  Reviewed  Social History: Smoking: No Alcohol Use: Yes  Family History:  There is no family history of premature cerebrovascular  disease pertinent to this consultation  ROS : 14 Points Review of Systems was performed and was negative except mentioned in HPI.  Past Surgical History: There Is No Surgical History Contributory To Today's Visit     Examination: BP(140/73), Pulse(65), 1A: Level of Consciousness - Alert; keenly responsive + 0 1B: Ask Month and Age - Both Questions Right + 0 1C: Blink Eyes & Squeeze Hands - Performs Both Tasks + 0 2: Test Horizontal Extraocular Movements - Normal + 0 3: Test Visual Fields - No Visual Loss + 0 4: Test Facial Palsy (Use Grimace if Obtunded) - Normal symmetry + 0 5A: Test Left Arm Motor Drift - No Drift for 10 Seconds + 0 5B: Test Right Arm Motor Drift - No Drift for 10 Seconds + 0 6A: Test Left Leg Motor Drift - No Drift for 5 Seconds + 0 6B: Test Right Leg Motor Drift - No Drift for 5 Seconds + 0 7: Test Limb Ataxia (FNF/Heel-Shin) - No Ataxia + 0 8: Test Sensation - Normal; No sensory loss + 0 9: Test Language/Aphasia - Normal; No aphasia + 0 10: Test Dysarthria - Normal + 0 11: Test Extinction/Inattention - No abnormality + 0  NIHSS Score: 0  NIHSS Free Text : ambulate independent  Pre-Morbid Modified Rankin Scale: 0 Points = No symptoms at all  Spoke with : Dr. Roselyn I reviewed the available imaging via Rapid and initiated discussion with the primary provider  This consult was conducted in real time using interactive audio and video technology. Patient was informed of the technology being used for this visit and agreed to proceed. Patient located in hospital and provider located at home/office setting.   Patient is being evaluated for possible acute neurologic impairment and high probability of imminent or life-threatening deterioration. I spent total of 40 minutes providing care to this patient, including time for face to face visit via telemedicine, review of medical records, imaging studies and discussion of findings with providers, the patient and/or  family.    Dr Shirlee Pam   [facility] For Inpatient follow-up with [facility] physician please call [facility] at [phone]. As we are not an outpatient service for any post hospital discharge needs please contact the hospital for assistance. If you have any questions for the [facility] physicians or need to reconsult for clinical or diagnostic changes please contact us  via [facility] at [phone].  Non-radiologist review of imaging performed to assist with emergent clinical decision-making. Remote physician workstations do not possess the same resolution, calibration, or diagnostic capabilities as hospital-based radiology reading stations, and formal radiologist read is necessary.   Signature : Shirlee Pam

## 2024-05-29 NOTE — ED Notes (Signed)
 ED Provider at bedside.

## 2024-05-30 ENCOUNTER — Observation Stay (HOSPITAL_COMMUNITY)

## 2024-05-30 DIAGNOSIS — W19XXXA Unspecified fall, initial encounter: Secondary | ICD-10-CM | POA: Diagnosis not present

## 2024-05-30 DIAGNOSIS — I10 Essential (primary) hypertension: Secondary | ICD-10-CM | POA: Diagnosis not present

## 2024-05-30 DIAGNOSIS — E785 Hyperlipidemia, unspecified: Secondary | ICD-10-CM

## 2024-05-30 DIAGNOSIS — Z87891 Personal history of nicotine dependence: Secondary | ICD-10-CM | POA: Diagnosis not present

## 2024-05-30 DIAGNOSIS — G20A1 Parkinson's disease without dyskinesia, without mention of fluctuations: Secondary | ICD-10-CM

## 2024-05-30 DIAGNOSIS — M1A9XX1 Chronic gout, unspecified, with tophus (tophi): Secondary | ICD-10-CM

## 2024-05-30 DIAGNOSIS — Z7901 Long term (current) use of anticoagulants: Secondary | ICD-10-CM

## 2024-05-30 DIAGNOSIS — R270 Ataxia, unspecified: Secondary | ICD-10-CM | POA: Diagnosis not present

## 2024-05-30 DIAGNOSIS — G459 Transient cerebral ischemic attack, unspecified: Secondary | ICD-10-CM | POA: Diagnosis not present

## 2024-05-30 LAB — ECHOCARDIOGRAM COMPLETE BUBBLE STUDY
AR max vel: 2.35 cm2
AV Peak grad: 8.9 mmHg
Ao pk vel: 1.5 m/s
Area-P 1/2: 4.8 cm2
S' Lateral: 3.7 cm

## 2024-05-30 LAB — COMPREHENSIVE METABOLIC PANEL WITH GFR
ALT: 18 U/L (ref 0–44)
AST: 21 U/L (ref 15–41)
Albumin: 3.3 g/dL — ABNORMAL LOW (ref 3.5–5.0)
Alkaline Phosphatase: 61 U/L (ref 38–126)
Anion gap: 10 (ref 5–15)
BUN: 14 mg/dL (ref 8–23)
CO2: 26 mmol/L (ref 22–32)
Calcium: 9.1 mg/dL (ref 8.9–10.3)
Chloride: 103 mmol/L (ref 98–111)
Creatinine, Ser: 1.07 mg/dL (ref 0.61–1.24)
GFR, Estimated: >60 mL/min (ref >60)
Glucose, Bld: 91 mg/dL (ref 70–99)
Potassium: 4 mmol/L (ref 3.5–5.1)
Sodium: 139 mmol/L (ref 135–145)
Total Bilirubin: 1.5 mg/dL — ABNORMAL HIGH (ref 0.0–1.2)
Total Protein: 5.5 g/dL — ABNORMAL LOW (ref 6.5–8.1)

## 2024-05-30 LAB — CBC
HCT: 39.3 % (ref 39.0–52.0)
Hemoglobin: 13.6 g/dL (ref 13.0–17.0)
MCH: 31.3 pg (ref 26.0–34.0)
MCHC: 34.6 g/dL (ref 30.0–36.0)
MCV: 90.3 fL (ref 80.0–100.0)
Platelets: 142 K/uL — ABNORMAL LOW (ref 150–400)
RBC: 4.35 MIL/uL (ref 4.22–5.81)
RDW: 12.2 % (ref 11.5–15.5)
WBC: 6.3 K/uL (ref 4.0–10.5)
nRBC: 0 % (ref 0.0–0.2)

## 2024-05-30 LAB — LIPID PANEL
Cholesterol: 116 mg/dL (ref 0–200)
HDL: 62 mg/dL (ref >40)
LDL Cholesterol: 42 mg/dL (ref 0–99)
Total CHOL/HDL Ratio: 1.9 ratio
Triglycerides: 60 mg/dL (ref <150)
VLDL: 12 mg/dL (ref 0–40)

## 2024-05-30 LAB — HEMOGLOBIN A1C
Hgb A1c MFr Bld: 5.2 % (ref 4.8–5.6)
Mean Plasma Glucose: 102.54 mg/dL

## 2024-05-30 MED ORDER — PREDNISONE 20 MG PO TABS
40.0000 mg | ORAL_TABLET | Freq: Every day | ORAL | Status: DC
  Administered 2024-05-30: 40 mg via ORAL
  Filled 2024-05-30: qty 2

## 2024-05-30 MED ORDER — PREDNISONE 20 MG PO TABS: 40.0000 mg | ORAL_TABLET | Freq: Every day | ORAL | Status: DC

## 2024-05-30 MED ORDER — PREDNISOLONE 5 MG PO TABS: 40.0000 mg | ORAL_TABLET | Freq: Every day | ORAL | Status: DC

## 2024-05-30 MED ORDER — PREDNISONE 20 MG PO TABS: 40.0000 mg | ORAL_TABLET | Freq: Every day | ORAL | 0 refills | Status: AC

## 2024-05-30 NOTE — Progress Notes (Incomplete)
 [facility]  PROGRESS NOTE  [name] [name] FMW:[id number] DOB: 04/10/1951 DOA: 05/28/2024 PCP: Micheal Wolm ORN, MD   Brief HPI:   73 y.o. male with medical history significant of hypertension, hyperlipidemia, GERD, atrial fibrillation, CAD status post CABG, psoriatic arthritis, OSA, gout presenting after a fall and subsequent balance issues.     Assessment/Plan:   ***      DVT prophylaxis: ***  Medications      stroke: early stages of recovery book   Does not apply Once   apixaban   5 mg Oral BID   atorvastatin   80 mg Oral Daily   metoprolol  succinate  50 mg Oral Daily   prednisoLONE  40 mg Oral Daily     Data Reviewed:   CBG:  Recent Labs  Lab 05/28/24 2309  GLUCAP 93    SpO2: 95 %    Vitals:   05/29/24 2036 05/29/24 2352 05/30/24 0443 05/30/24 0726  BP: 115/60 138/61 136/60 (!) 141/60  Pulse: 75 65 (!) 59 73  Resp: 18 16  15   Temp: 97.6 F (36.4 C) 97.8 F (36.6 C) (!) 97.3 F (36.3 C) 97.7 F (36.5 C)  TempSrc: Oral Oral Oral Oral  SpO2: 97% 98% 96% 95%  Weight:      Height:          Data Reviewed:  Basic Metabolic Panel: Recent Labs  Lab 05/29/24 0017 05/30/24 0303  NA 141 139  K 4.0 4.0  CL 101 103  CO2 27 26  GLUCOSE 101* 91  BUN 11 14  CREATININE 0.85 1.07  CALCIUM  9.9 9.1    CBC: Recent Labs  Lab 05/29/24 0017 05/30/24 0303  WBC 8.9 6.3  NEUTROABS 6.7  --   HGB 15.0 13.6  HCT 43.3 39.3  MCV 90.0 90.3  PLT 154 142*    LFT Recent Labs  Lab 05/29/24 0017 05/30/24 0303  AST 33 21  ALT 27 18  ALKPHOS 92 61  BILITOT 0.8 1.5*  PROT 6.9 5.5*  ALBUMIN 4.5 3.3*     Antibiotics: Anti-infectives (From admission, onward)    None        CONSULTS ***  Code Status: ***  Family Communication: ***     Subjective   ***   Objective    Physical Examination:   General:  *** Cardiovascular: *** Respiratory: *** Abdomen: *** Extremities: ***  Neurologic:  ***   Status is: Inpatient:   ***           [place] S Jazzie Trampe   [facility] If 7PM-7AM, please contact night-coverage at [email or web address], Office  [phone]   05/30/2024, 10:06 AM  LOS: 0 days

## 2024-05-30 NOTE — Plan of Care (Signed)
  Problem: Education: Goal: Knowledge of General Education information will improve Description: Including pain rating scale, medication(s)/side effects and non-pharmacologic comfort measures 05/30/2024 0556 by Gwynneth Richerd LABOR, RN Outcome: Progressing 05/30/2024 0556 by Gwynneth Richerd LABOR, RN Outcome: Progressing   Problem: Health Behavior/Discharge Planning: Goal: Ability to manage health-related needs will improve 05/30/2024 0556 by Gwynneth Richerd LABOR, RN Outcome: Progressing 05/30/2024 0556 by Gwynneth Richerd LABOR, RN Outcome: Progressing   Problem: Clinical Measurements: Goal: Ability to maintain clinical measurements within normal limits will improve Outcome: Progressing Goal: Will remain free from infection Outcome: Progressing Goal: Diagnostic test results will improve 05/30/2024 0556 by Gwynneth Richerd LABOR, RN Outcome: Progressing 05/30/2024 0556 by Gwynneth Richerd LABOR, RN Outcome: Progressing

## 2024-05-30 NOTE — Progress Notes (Signed)
 SLP Cancellation Note  Patient Details Name: [name] [name] MRN: [id number] DOB: January 05, 1951   Cancelled treatment:       Reason Eval/Treat Not Completed: SLP screened, no needs identified. Pt and family member report no changes in speech/language/cognition. Pt at baseline. Will sign off.    [name], MA, CCC-SLP Acute Rehabilitation Services Office Number: [phone]  Wilder [name] 05/30/2024, 10:46 AM

## 2024-05-30 NOTE — Discharge Summary (Addendum)
 Physician Discharge Summary   Patient: [name] [name] MRN: [id number] DOB: 1951/04/13  Admit date:     05/28/2024  Discharge date: 05/30/24  Discharge Physician: Sabas GORMAN Brod   PCP: Micheal Wolm ORN, MD   Recommendations at discharge:   Follow-up PCP in 1 week  Discharge Diagnoses: Principal Problem:   TIA (transient ischemic attack) Active Problems:   Coronary artery disease   Hypercholesterolemia   S/P CABG (coronary artery bypass graft)   Gout   Hypertension   OSA (obstructive sleep apnea)   Persistent atrial fibrillation (HCC)   Psoriatic arthritis (HCC)   GERD (gastroesophageal reflux disease)  Resolved Problems:   * No resolved hospital problems. *   73 y.o. male with medical history significant of hypertension, hyperlipidemia, GERD, atrial fibrillation, CAD status post CABG, psoriatic arthritis, OSA, gout presenting after a fall and subsequent balance issues.   Patient had a fall yesterday evening and had continued balance issues after that fall.  Did not hit his head during the fall.  EMS came to help patient get up and ultimately patient was transported to the ED for further evaluation.  Return to baseline in the ED.   Denies fevers, chills, chest pain, shortness of breath, abdominal pain, constipation, diarrhea, nausea, vomiting.   Reportedly is undergoing workup for Parkinson's disease outpatient.   ED Course: Vital signs in the ED notable for blood pressure in the 90s-140 systolic, heart rate in the 50s-70s.   Lab workup included CMP with glucose 101.  CBC within normal limits.  PT, PTT, INR within normal limits.   CT head showed no acute abnormality.   Teleneurology was consulted who recommended TIA/CVA workup and continue with home Eliquis .       Hospital Course:  Status post fall - Likely in setting of acute gout attack involving the right knee and right ankle - He was admitted for possible TIA - TIA workup completed - 2D echo showed positive  bubble study, discussed with neurology, no further intervention recommended - MRI brain was negative for stroke, CTA head and neck showed 40% stenosis of the left ICA due to atherosclerosis - Neurology has signed off - Fall likely in setting of gout attack, improved after starting prednisone   Acute gout attack - Patient has history of gout, and has acute attack involving right knee and right ankle - Improved after starting prednisone  40 mg p.o. x 1 - Will discharge on prednisone  40 mg p.o. daily for 3 days - Continue colchicine ; patient's home medication  Atrial fibrillation - Patient is on metoprolol  and apixaban  - Will continue with these medications  CAD - Status post CABG - Continue losartan , metoprolol , atorvastatin   Psoriatic arthritis - Follows outpatient  OSA - Patient on CPAP at home             Consultants: Neurology Procedures performed: 2D echo Disposition: Home Diet recommendation:  Discharge Diet Orders (From admission, onward)     Start     Ordered   05/30/24 0000  Diet - low sodium heart healthy        05/30/24 1603           Regular diet DISCHARGE MEDICATION: Allergies as of 05/30/2024   No Known Allergies      Medication List     TAKE these medications    acetaminophen  325 MG tablet Commonly known as: Tylenol  Take 2 tablets (650 mg total) by mouth every 6 (six) hours as needed for up to 30 doses for mild  pain or moderate pain.   atorvastatin  80 MG tablet Commonly known as: LIPITOR TAKE 1 TABLET BY MOUTH EVERY DAY   cholecalciferol  1000 units tablet Commonly known as: VITAMIN D Take 1,000 Units by mouth daily.   CO Q 10 PO Take 100 mg by mouth daily.   colchicine  0.6 MG tablet TAKE TWO AT ONSET OF GOUT AND THEN ONE BY MOUTH 2 TIMES DAILY AS NEEDED   Eliquis  5 MG Tabs tablet Generic drug: apixaban  TAKE 1 TABLET BY MOUTH TWICE A DAY   furosemide  20 MG tablet Commonly known as: LASIX  TAKE 1 TABLET BY MOUTH TWICE A  DAY   losartan  50 MG tablet Commonly known as: COZAAR  TAKE 1 TABLET BY MOUTH EVERY DAY   metoprolol  succinate 50 MG 24 hr tablet Commonly known as: TOPROL -XL Take 1 tablet by mouth daily.   multivitamin per tablet Take 1 tablet by mouth daily.   nitroGLYCERIN  0.4 MG SL tablet Commonly known as: NITROSTAT  PLACE 1 TABLET UNDER THE TONGUE EVERY 5 MINUTES AS NEEDED FOR CHEST PAIN.   potassium chloride  SA 20 MEQ tablet Commonly known as: KLOR-CON  M Take 1 tablet (20 mEq total) by mouth daily.   predniSONE  20 MG tablet Commonly known as: DELTASONE  Take 2 tablets (40 mg total) by mouth daily with breakfast for 3 days. Start taking on: May 31, 2024   sildenafil  100 MG tablet Commonly known as: Viagra  Take 1 tablet (100 mg total) by mouth daily as needed for erectile dysfunction.   Skyrizi Pen 150 MG/ML pen Generic drug: risankizumab-rzaa Inject 150 mg into the skin every 3 (three) months.        Discharge Exam: Filed Weights   05/28/24 2310  Weight: 90.7 kg   General-appears in no acute distress Heart-S1-S2, regular, no murmur auscultated Lungs-clear to auscultation bilaterally, no wheezing or crackles auscultated Abdomen-soft, nontender, no organomegaly Extremities-no edema in the lower extremities Neuro-alert, oriented x3, no focal deficit noted  Condition at discharge: good  The results of significant diagnostics from this hospitalization (including imaging, microbiology, ancillary and laboratory) are listed below for reference.   Imaging Studies: ECHOCARDIOGRAM COMPLETE BUBBLE STUDY Result Date: 05/30/2024    ECHOCARDIOGRAM REPORT   Patient Name:   [name] [name] Date of Exam: 05/30/2024 Medical Rec #:  [id number]       Height:       73.0 in Accession #:    [id number]      Weight:       200.0 lb Date of Birth:  Aug 20, 1950       BSA:          2.152 m Patient Age:    73 years        BP:           147/60 mmHg Patient Gender: M               HR:           70 bpm.  Exam Location:  Inpatient Procedure: 2D Echo, Cardiac Doppler, Color Doppler and Saline Contrast Bubble            Study (Both Spectral and Color Flow Doppler were utilized during            procedure). Indications:    TIA G45.9  History:        Patient has prior history of Echocardiogram examinations, most                 recent 05/02/2018. CAD, Prior  CABG, TIA, Arrythmias:Atrial                 Fibrillation and Atrial Flutter; Risk Factors:Sleep Apnea.  Sonographer:    Thea Norlander RCS Referring Phys: MARSA NOVAK MELVIN IMPRESSIONS  1. Left ventricular ejection fraction, by estimation, is 60 to 65%. The left ventricle has normal function. The left ventricle has no regional wall motion abnormalities. The left ventricular internal cavity size was mildly dilated. Left ventricular diastolic parameters were normal.  2. Right ventricular systolic function is normal. The right ventricular size is normal.  3. Left atrial size was moderately dilated.  4. Bubble study positive with bubbles appearing in L sided chambers only after valsalva maneuver . Evidence of atrial level shunting detected by color flow Doppler. Agitated saline contrast bubble study was positive with shunting observed within 3-6 cardiac cycles suggestive of interatrial shunt.  5. Right atrial size was moderately dilated.  6. Mild mitral valve regurgitation.  7. Tricuspid valve regurgitation is mild to moderate.  8. The aortic valve is tricuspid. Aortic valve regurgitation is moderate. Aortic valve sclerosis/calcification is present, without any evidence of aortic stenosis.  9. The inferior vena cava is normal in size with greater than 50% respiratory variability, suggesting right atrial pressure of 3 mmHg. FINDINGS  Left Ventricle: Left ventricular ejection fraction, by estimation, is 60 to 65%. The left ventricle has normal function. The left ventricle has no regional wall motion abnormalities. The left ventricular internal cavity size was mildly  dilated. There is  no left ventricular hypertrophy. Left ventricular diastolic parameters were normal. Right Ventricle: The right ventricular size is normal. Right vetricular wall thickness was not assessed. Right ventricular systolic function is normal. Left Atrium: Left atrial size was moderately dilated. Right Atrium: Right atrial size was moderately dilated. Pericardium: Trivial pericardial effusion is present. Mitral Valve: There is mild thickening of the mitral valve leaflet(s). Mild mitral annular calcification. Mild mitral valve regurgitation. Tricuspid Valve: The tricuspid valve is normal in structure. Tricuspid valve regurgitation is mild to moderate. Aortic Valve: The aortic valve is tricuspid. Aortic valve regurgitation is moderate. Aortic valve sclerosis/calcification is present, without any evidence of aortic stenosis. Aortic valve peak gradient measures 8.9 mmHg. Pulmonic Valve: The pulmonic valve was normal in structure. Pulmonic valve regurgitation is not visualized. Aorta: The aortic root and ascending aorta are structurally normal, with no evidence of dilitation. Venous: The inferior vena cava is normal in size with greater than 50% respiratory variability, suggesting right atrial pressure of 3 mmHg. IAS/Shunts: Evidence of atrial level shunting detected by color flow Doppler. Agitated saline contrast was given intravenously to evaluate for intracardiac shunting. Agitated saline contrast bubble study was positive with shunting observed within 3-6 cardiac cycles suggestive of interatrial shunt.  LEFT VENTRICLE PLAX 2D LVIDd:         5.70 cm   Diastology LVIDs:         3.70 cm   LV e' medial:    13.10 cm/s LV PW:         0.90 cm   LV E/e' medial:  5.4 LV IVS:        1.00 cm   LV e' lateral:   11.20 cm/s LVOT diam:     2.00 cm   LV E/e' lateral: 6.3 LV SV:         77 LV SV Index:   36 LVOT Area:     3.14 cm LV IVRT:       63 msec  RIGHT  VENTRICLE             IVC RV S prime:     11.40 cm/s  IVC diam:  1.50 cm TAPSE (M-mode): 2.2 cm LEFT ATRIUM             Index        RIGHT ATRIUM           Index LA diam:        5.20 cm 2.42 cm/m   RA Area:     29.70 cm LA Vol (A2C):   89.1 ml 41.41 ml/m  RA Volume:   99.25 ml  46.13 ml/m LA Vol (A4C):   80.5 ml 37.41 ml/m LA Biplane Vol: 86.3 ml 40.11 ml/m  AORTIC VALVE AV Area (Vmax): 2.35 cm AV Vmax:        149.50 cm/s AV Peak Grad:   8.9 mmHg LVOT Vmax:      112.00 cm/s LVOT Vmean:     73.400 cm/s LVOT VTI:       0.244 m  AORTA Ao Root diam: 3.20 cm Ao Asc diam:  3.80 cm MITRAL VALVE               TRICUSPID VALVE MV Area (PHT): 4.80 cm    TR Peak grad:   27.2 mmHg MV Decel Time: 158 msec    TR Vmax:        261.00 cm/s MV E velocity: 70.70 cm/s MV A velocity: 43.00 cm/s  SHUNTS MV E/A ratio:  1.64        Systemic VTI:  0.24 m                            Systemic Diam: 2.00 cm Vina Gull MD Electronically signed by Vina Gull MD Signature Date/Time: 05/30/2024/2:36:12 PM    Final    MR BRAIN WO CONTRAST Result Date: 05/30/2024 EXAM: MRI Brain Without Contrast 05/29/2024 07:45:09 PM TECHNIQUE: Multiplanar multisequence MRI of the head/brain was performed without the administration of intravenous contrast. COMPARISON: CT head 01/10/2024 CLINICAL HISTORY: Transient ischemic attack (TIA) FINDINGS: BRAIN AND VENTRICLES: No acute infarct. No intracranial hemorrhage. No mass. No midline shift. No hydrocephalus. Normal flow voids. ORBITS: No acute abnormality. SINUSES AND MASTOIDS: No acute abnormality. BONES AND SOFT TISSUES: Normal marrow signal. No acute soft tissue abnormality. IMPRESSION: 1. No acute intracranial abnormality. Electronically signed by: Gilmore Molt MD 05/30/2024 03:06 AM EST RP Workstation: [id number]   CT ANGIO HEAD NECK W WO CM Result Date: 05/30/2024 EXAM: CTA HEAD AND NECK WITHOUT AND WITH 05/29/2024 06:42:09 PM TECHNIQUE: CTA of the head and neck was performed without and with the administration of intravenous contrast ( iohexol (OMNIPAQUE)  350 MG/ML injection 150 mL IOHEXOL 350 MG/ML SOLN). Multiplanar 2D and/or 3D reformatted images are provided for review. Automated exposure control, iterative reconstruction, and/or weight based adjustment of the mA/kV was utilized to reduce the radiation dose to as low as reasonably achievable. Stenosis of the internal carotid arteries measured using NASCET criteria. COMPARISON: CT head 05/28/2022 05/28/2024 CLINICAL HISTORY: Stroke/TIA, determine embolic source FINDINGS: AORTIC ARCH AND ARCH VESSELS: No dissection or arterial injury. No significant stenosis of the brachiocephalic or subclavian arteries. CERVICAL CAROTID ARTERIES: Approximately 40% stenosis of the left ICA origin due to atherosclerosis. Right ICA is patent without greater than 50% stenosis. CERVICAL VERTEBRAL ARTERIES: No dissection, arterial injury, or significant stenosis. LUNGS AND MEDIASTINUM: Unremarkable. SOFT TISSUES: No acute abnormality. BONES: No acute abnormality.  ANTERIOR CIRCULATION: No significant stenosis of the internal carotid arteries. No significant stenosis of the anterior cerebral arteries. Hypoplastic right A1 ACA. No significant stenosis of the middle cerebral arteries. No aneurysm. POSTERIOR CIRCULATION: No significant stenosis of the posterior cerebral arteries. No significant stenosis of the basilar artery. No significant stenosis of the vertebral arteries. No aneurysm. OTHER: No dural venous sinus thrombosis on this non-dedicated study. IMPRESSION: 1. No large vessel occlusion. 2. Approximately 40% stenosis of the left ICA origin due to atherosclerosis. 3. No evidence of acute intracranial abnormality. Electronically signed by: Gilmore Molt MD 05/30/2024 01:56 AM EST RP Workstation: [id number]   CT HEAD CODE STROKE WO CONTRAST (LKW 0-4.5h, LVO 0-24h) Result Date: 05/29/2024 EXAM: CT HEAD WITHOUT CONTRAST 05/28/2024 11:30:00 PM TECHNIQUE: CT of the head was performed without the administration of intravenous  contrast. Automated exposure control, iterative reconstruction, and/or weight based adjustment of the mA/kV was utilized to reduce the radiation dose to as low as reasonably achievable. COMPARISON: None available. CLINICAL HISTORY: Neuro deficit, acute, stroke suspected. FINDINGS: BRAIN AND VENTRICLES: No acute hemorrhage. No evidence of acute infarct. No hydrocephalus. No extra-axial collection. No mass effect or midline shift. ORBITS: No acute abnormality. SINUSES: No acute abnormality. SOFT TISSUES AND SKULL: No skull fracture. Findings discussed with Dr. Roselyn at 12:39 PM.  The report was delayed due to technical issue causing study to not be on the list/ready to read initially. IMPRESSION: 1. No acute intracranial abnormality. Electronically signed by: Gilmore Molt MD 05/29/2024 12:40 AM EST RP Workstation: [id number]    Microbiology: Results for orders placed or performed in visit on 06/17/19  Novel Coronavirus, NAA ([facility])     Status: None   Collection Time: 06/17/19 12:00 AM   Specimen: Nasopharyngeal(NP) swabs in vial transport medium   NASOPHARYNGE  TESTING  Result Value Ref Range Status   SARS-CoV-2, NAA Not Detected Not Detected Final    Comment: This nucleic acid amplification test was developed and its performance characteristics determined by [employer]. Nucleic acid amplification tests include PCR and TMA. This test has not been FDA cleared or approved. This test has been authorized by FDA under an Emergency Use Authorization (EUA). This test is only authorized for the duration of time the declaration that circumstances exist justifying the authorization of the emergency use of in vitro diagnostic tests for detection of SARS-CoV-2 virus and/or diagnosis of COVID-19 infection under section 564(b)(1) of the Act, 21 U.S.C. 639aaa-6(a) (1), unless the authorization is terminated or revoked sooner. When diagnostic testing is negative, the possibility of a  false negative result should be considered in the context of a patient's recent exposures and the presence of clinical signs and symptoms consistent with COVID-19. An individual without symptoms of COVID-19 and who is not shedding SARS-CoV-2 virus would  expect to have a negative (not detected) result in this assay.     Labs: CBC: Recent Labs  Lab 05/29/24 0017 05/30/24 0303  WBC 8.9 6.3  NEUTROABS 6.7  --   HGB 15.0 13.6  HCT 43.3 39.3  MCV 90.0 90.3  PLT 154 142*   Basic Metabolic Panel: Recent Labs  Lab 05/29/24 0017 05/30/24 0303  NA 141 139  K 4.0 4.0  CL 101 103  CO2 27 26  GLUCOSE 101* 91  BUN 11 14  CREATININE 0.85 1.07  CALCIUM  9.9 9.1   Liver Function Tests: Recent Labs  Lab 05/29/24 0017 05/30/24 0303  AST 33 21  ALT 27 18  ALKPHOS 92 61  BILITOT 0.8 1.5*  PROT 6.9 5.5*  ALBUMIN 4.5 3.3*   CBG: Recent Labs  Lab 05/28/24 2309  GLUCAP 93    Discharge time spent: greater than 30 minutes.  Signed: Sabas GORMAN Brod, MD [facility] 05/30/2024

## 2024-05-30 NOTE — Consult Note (Addendum)
 STROKE TEAM PROGRESS NOTE    SIGNIFICANT HOSPITAL EVENTS  11/7: Presented s/p fall where he was not able to get himself up. Denies LOC or hitting head during fall. Imaging negative.   INTERIM HISTORY/SUBJECTIVE  Wife at bedside.  Patient sitting up in bed. Described falling backwards at home.  He denied any accompanying loss of consciousness, presyncopal symptoms, focal neurological symptoms.  He does have Parkinson's disease and balance is not good.  Tested gait and balance in room, patient had tendency to fall backwards even when he knew he was going to be pushed in that direction (retropulsion)  Exam positive for parkinsonian tremors and retropulsion. No focal or sensory deficits.  MRI negative. Likely fall and balance issues due to suspected Parkinson's  Continue Eliquis   OBJECTIVE  CBC    Component Value Date/Time   WBC 6.3 05/30/2024 0303   RBC 4.35 05/30/2024 0303   HGB 13.6 05/30/2024 0303   HGB 15.5 05/07/2024 1128   HCT 39.3 05/30/2024 0303   HCT 46.8 05/07/2024 1128   PLT 142 (L) 05/30/2024 0303   PLT 166 05/07/2024 1128   MCV 90.3 05/30/2024 0303   MCV 94 05/07/2024 1128   MCH 31.3 05/30/2024 0303   MCHC 34.6 05/30/2024 0303   RDW 12.2 05/30/2024 0303   RDW 12.0 05/07/2024 1128   LYMPHSABS 1.5 05/29/2024 0017   LYMPHSABS 1.6 07/29/2018 0921   MONOABS 0.5 05/29/2024 0017   EOSABS 0.1 05/29/2024 0017   EOSABS 0.1 07/29/2018 0921   BASOSABS 0.0 05/29/2024 0017   BASOSABS 0.1 07/29/2018 0921    BMET    Component Value Date/Time   NA 139 05/30/2024 0303   NA 139 05/07/2024 1128   K 4.0 05/30/2024 0303   CL 103 05/30/2024 0303   CO2 26 05/30/2024 0303   GLUCOSE 91 05/30/2024 0303   BUN 14 05/30/2024 0303   BUN 18 05/07/2024 1128   CREATININE 1.07 05/30/2024 0303   CREATININE 0.79 02/26/2020 0916   CALCIUM  9.1 05/30/2024 0303   EGFR 89 05/07/2024 1128   GFRNONAA >60 05/30/2024 0303    IMAGING past 24 hours MR BRAIN WO CONTRAST Result Date:  05/30/2024 EXAM: MRI Brain Without Contrast 05/29/2024 07:45:09 PM TECHNIQUE: Multiplanar multisequence MRI of the head/brain was performed without the administration of intravenous contrast. COMPARISON: CT head 01/10/2024 CLINICAL HISTORY: Transient ischemic attack (TIA) FINDINGS: BRAIN AND VENTRICLES: No acute infarct. No intracranial hemorrhage. No mass. No midline shift. No hydrocephalus. Normal flow voids. ORBITS: No acute abnormality. SINUSES AND MASTOIDS: No acute abnormality. BONES AND SOFT TISSUES: Normal marrow signal. No acute soft tissue abnormality. IMPRESSION: 1. No acute intracranial abnormality. Electronically signed by: Gilmore Molt MD 05/30/2024 03:06 AM EST RP Workstation: [id number]   CT ANGIO HEAD NECK W WO CM Result Date: 05/30/2024 EXAM: CTA HEAD AND NECK WITHOUT AND WITH 05/29/2024 06:42:09 PM TECHNIQUE: CTA of the head and neck was performed without and with the administration of intravenous contrast ( iohexol (OMNIPAQUE) 350 MG/ML injection 150 mL IOHEXOL 350 MG/ML SOLN). Multiplanar 2D and/or 3D reformatted images are provided for review. Automated exposure control, iterative reconstruction, and/or weight based adjustment of the mA/kV was utilized to reduce the radiation dose to as low as reasonably achievable. Stenosis of the internal carotid arteries measured using NASCET criteria. COMPARISON: CT head 05/28/2022 05/28/2024 CLINICAL HISTORY: Stroke/TIA, determine embolic source FINDINGS: AORTIC ARCH AND ARCH VESSELS: No dissection or arterial injury. No significant stenosis of the brachiocephalic or subclavian arteries. CERVICAL CAROTID ARTERIES: Approximately 40% stenosis of  the left ICA origin due to atherosclerosis. Right ICA is patent without greater than 50% stenosis. CERVICAL VERTEBRAL ARTERIES: No dissection, arterial injury, or significant stenosis. LUNGS AND MEDIASTINUM: Unremarkable. SOFT TISSUES: No acute abnormality. BONES: No acute abnormality. ANTERIOR  CIRCULATION: No significant stenosis of the internal carotid arteries. No significant stenosis of the anterior cerebral arteries. Hypoplastic right A1 ACA. No significant stenosis of the middle cerebral arteries. No aneurysm. POSTERIOR CIRCULATION: No significant stenosis of the posterior cerebral arteries. No significant stenosis of the basilar artery. No significant stenosis of the vertebral arteries. No aneurysm. OTHER: No dural venous sinus thrombosis on this non-dedicated study. IMPRESSION: 1. No large vessel occlusion. 2. Approximately 40% stenosis of the left ICA origin due to atherosclerosis. 3. No evidence of acute intracranial abnormality. Electronically signed by: Gilmore Molt MD 05/30/2024 01:56 AM EST RP Workstation: [id number]    Vitals:   05/29/24 2036 05/29/24 2352 05/30/24 0443 05/30/24 0726  BP: 115/60 138/61 136/60 (!) 141/60  Pulse: 75 65 (!) 59 73  Resp: 18 16  15   Temp: 97.6 F (36.4 C) 97.8 F (36.6 C) (!) 97.3 F (36.3 C) 97.7 F (36.5 C)  TempSrc: Oral Oral Oral Oral  SpO2: 97% 98% 96% 95%  Weight:      Height:       PHYSICAL EXAM General:  Alert, well-nourished, well-developed patient in no acute distress Psych:  Mood and affect appropriate for situation CV: Regular rate and rhythm on monitor Respiratory:  Regular, unlabored respirations on room air GI: Abdomen soft and nontender   NEURO:  Mental Status: AA&Ox3, patient is able to give clear and coherent history Speech/Language: speech is without dysarthria or aphasia.  Naming, repetition, fluency, and comprehension intact.  Cranial Nerves:  II: PERRL. Visual fields full.  III, IV, VI: EOMI. Eyelids elevate symmetrically.  V: Sensation is intact to light touch and symmetrical to face.  VII: Face is symmetrical resting and smiling VIII: hearing intact to voice. IX, X: Palate elevates symmetrically. Phonation is normal.  KP:Dynloizm shrug 5/5. XII: tongue is midline without fasciculations. Motor: 5/5  strength to all muscle groups tested.  Tone: is normal and bulk is normal Sensation- Intact to light touch bilaterally. Extinction absent to light touch to DSS.   Coordination: FTN intact bilaterally, HKS: no ataxia in BLE.No drift.  Gait-cautious and favors right leg due to gout.  Positive retropulsion and patient will fall if not caught  Most Recent NIH: 0.   ASSESSMENT/PLAN  [name] is a  73 y.o. male with medical history significant of hypertension, hyperlipidemia, GERD, atrial fibrillation, CAD status post CABG, psoriatic arthritis, OSA, gout presenting after a fall and subsequent balance issues.Patient had a fall yesterday evening and had continued balance issues after that fall.  Did not hit his head during the fall.  EMS came to help patient get up and ultimately patient was transported to the ED for further evaluation.  Return to baseline in the ED. NIH on Admission: 0.  S/p fall secondary to Parkinson's Disease and associated retropulsion Code Stroke CT head No acute abnormality.  CTA head & neck  No large vessel occlusion. Approximately 40% stenosis of the left ICA origin due to atherosclerosis. No evidence of acute intracranial abnormality. MRI   No acute intracranial abnormality.  2D Echo: EF 60-65%, Moderately dilated LA, Positive bubble study suggestive of intraarterial shunt,Moderately dilated R atria, Mild MVR, Mild to moderate TVR, moderate AVR  LDL 42 HgbA1c 5.2 VTE prophylaxis - eliquis  Eliquis   5mg  BID prior to admission, continued Therapy recommendations:  No follow up needed  Disposition:  pending  Hypertension Home meds:  losartan  50mg  daily, toprol -xl 50mg  daily,  Stable BP goal normotensive   Hyperlipidemia Home meds:  Lipitor 80mg , continued LDL 42, goal < 70 Continue statin at discharge  Tobacco Abuse Former cigarette smoker  Other Stroke Risk Factors Family hx stroke (mother) Coronary artery disease Obstructive sleep apnea, on CPAP  at home  Hospital day # 0  Pt seen by Neuro NP/APP with MD. Note/plan to be edited by MD as needed.    Rocky JAYSON Likes, DNP [facility] Please use AMION for contact information & EPIC for messaging.  I have personally obtained history,examined this patient, reviewed notes, independently viewed imaging studies, participated in medical decision making and plan of care.ROS completed by me personally and pertinent positives fully documented  I have made any additions or clarifications directly to the above note. Agree with note above.  Patient with Parkinson disease presented with sudden onset of falling backwards while getting up without loss of consciousness or any premonitory symptoms or presyncopal symptoms.  No focal neurological complaints.  Neurological exam nonfocal except mild parkinsonian tremor on the left.  Positive retropulsion on tug test patient will follow if not caught.  Doubt TIA.  2D echo shows positive bubble study but patient is not a candidate for PFO closure at his age.  Continue Eliquis  for stroke prevention for atrial fibrillation.  Continue home dose of parkinsonian medications.  Follow-up with Dr. Evonnie his neurologist.  No further stroke workup is necessary.  Discussed with Dr. Drusilla and patient and his wife and answered questions   I personally spent a total of 50 minutes in the care of the patient today including getting/reviewing separately obtained history, performing a medically appropriate exam/evaluation, counseling and educating, placing orders, referring and communicating with other health care professionals, documenting clinical information in the EHR, independently interpreting results, and coordinating care.        Eather Popp, MD Medical Director [facility] Pager: [phone] 05/30/2024 5:23 PM   To contact Stroke Continuity provider, please refer to [email or web address]. After hours, contact General Neurology

## 2024-05-30 NOTE — Evaluation (Signed)
 Occupational Therapy Evaluation Patient Details Name: [name] [name] MRN: [id number] DOB: [birth year] Today's Date: 05/30/2024   History of Present Illness   Pt is a 73 y/o M who presented to [facility] ED after a GLF. Endorses he lost his balance and fell backwards; denies hitting head. Imaging negative for acute processes. Neurology suspects TIA. PMHx: a-fib on Eliquis , CAD s/p CABG, HTN, HLD, O/P workup for Parkinson's disease.     Clinical Impressions Pt received seated EOB, agreeable for OT session. AOX4. PTA, pt was living with his wife and fully indep with ADLs and driving. Reports he does [employer] 2x/wk and was doing exercise classes with his wife regularly. Endorses mild tremor in L>R hands from Parkinson's. Functionally, he is near his baseline today - mod I for all mobility and ADLs. Mildly unsteady, pt reporting pain in knees 2/2 gout.   Given pt is functioning near his baseline, no further acute OT needs at this time. Will sign-off.     If plan is discharge home, recommend the following:   Assistance with cooking/housework;Assist for transportation     Functional Status Assessment         Equipment Recommendations   None recommended by OT     Recommendations for Other Services         Precautions/Restrictions   Precautions Precautions: Fall Restrictions Weight Bearing Restrictions Per Provider Order: No     Mobility Bed Mobility               General bed mobility comments: not assessed - pt received and left seated EOB    Transfers Overall transfer level: Needs assistance Equipment used: None Transfers: Sit to/from Stand Sit to Stand: Supervision           General transfer comment: Incr effort to come to upright using bed rail to power up.      Balance Overall balance assessment: Mild deficits observed, not formally tested (antalgic gait observed, pt endorsing B knee pain 2/2 gout)                                          ADL either performed or assessed with clinical judgement   ADL Overall ADL's : Independent;At baseline                                             Vision Baseline Vision/History: 0 No visual deficits Ability to See in Adequate Light: 0 Adequate Patient Visual Report: No change from baseline       Perception         Praxis         Pertinent Vitals/Pain Pain Assessment Pain Assessment: No/denies pain     Extremity/Trunk Assessment Upper Extremity Assessment Upper Extremity Assessment: Overall WFL for tasks assessed   Lower Extremity Assessment Lower Extremity Assessment: Overall WFL for tasks assessed   Cervical / Trunk Assessment Cervical / Trunk Assessment: Normal   Communication Communication Communication: No apparent difficulties   Cognition Arousal: Alert Behavior During Therapy: Flat affect Cognition: No apparent impairments             OT - Cognition Comments: demo's good insight                 Following commands: Intact  Cueing  General Comments   Cueing Techniques: Verbal cues  HR 70-80s throughout   Exercises     Shoulder Instructions      Home Living Family/patient expects to be discharged to:: Private residence Living Arrangements: Spouse/significant other Available Help at Discharge: Family;Available 24 hours/day Type of Home: House Home Access: Stairs to enter [employer] of Steps: 1 Entrance Stairs-Rails: None Home Layout: Two level;1/2 bath on main level;Bed/bath upstairs Alternate Level Stairs-Number of Steps: FF Alternate Level Stairs-Rails: Can reach both Bathroom Shower/Tub: Producer, Television/film/video: Handicapped height     Home Equipment: None          Prior Functioning/Environment Prior Level of Function : Independent/Modified Independent;Driving;History of Falls (last six months) (only 1 fall precipitating admission)             Mobility  Comments: no AD PTA ADLs Comments: indep, [place] [employer] [place]    OT Problem List: Impaired balance (sitting and/or standing);Pain   OT Treatment/Interventions:        OT Goals(Current goals can be found in the care plan section)   Acute Rehab OT Goals Patient Stated Goal: get better   OT Frequency:       Co-evaluation              AM-PAC OT 6 Clicks Daily Activity     Outcome Measure Help from another person eating meals?: None Help from another person taking care of personal grooming?: None Help from another person toileting, which includes using toliet, bedpan, or urinal?: None Help from another person bathing (including washing, rinsing, drying)?: None Help from another person to put on and taking off regular upper body clothing?: None Help from another person to put on and taking off regular lower body clothing?: None 6 Click Score: 24   End of Session Equipment Utilized During Treatment: Gait belt Nurse Communication: Mobility status  Activity Tolerance: Patient tolerated treatment well Patient left: with call bell/phone within reach (seated EOB)  OT Visit Diagnosis: Unsteadiness on feet (R26.81);Pain Pain - part of body: Knee                Time: 9252-9195 OT Time Calculation (min): 17 min Charges:  OT General Charges $OT Visit: 1 Visit OT Evaluation $OT Eval Low Complexity: 1 Low  Rikki CORDOBA MSOT, OTR/L Acute Rehabilitation Services [phone] Secure Chat Preferred  Rikki Milch 05/30/2024, 9:32 AM

## 2024-05-30 NOTE — Care Management Obs Status (Signed)
 MEDICARE OBSERVATION STATUS NOTIFICATION   Patient Details  Name: [name] [name] MRN: [id number] Date of Birth: Feb 25, 1951   Medicare Observation Status Notification Given:  Yes    Jaelene Garciagarcia G., RN 05/30/2024, 9:25 AM

## 2024-05-30 NOTE — Plan of Care (Signed)
  Problem: Education: Goal: Knowledge of General Education information will improve Description: Including pain rating scale, medication(s)/side effects and non-pharmacologic comfort measures Outcome: Progressing   Problem: Health Behavior/Discharge Planning: Goal: Ability to manage health-related needs will improve Outcome: Progressing   Problem: Clinical Measurements: Goal: Will remain free from infection Outcome: Progressing Goal: Diagnostic test results will improve Outcome: Progressing   Problem: Coping: Goal: Level of anxiety will decrease Outcome: Progressing   Problem: Elimination: Goal: Will not experience complications related to bowel motility Outcome: Progressing

## 2024-05-30 NOTE — Evaluation (Signed)
 Physical Therapy Evaluation Patient Details Name: [name] [name] MRN: [id number] DOB: 12-22-1950 Today's Date: 05/30/2024  History of Present Illness  Pt is a 73 y/o M who presented to [facility] ED 11/6 after a GLF. Endorses he lost his balance and fell backwards; denies hitting head. Imaging negative for acute processes. Neurology suspects TIA. PMHx: a-fib on Eliquis , CAD s/p CABG, HTN, HLD, O/P workup for Parkinson's disease.  Clinical Impression  Pt in bed upon arrival of PT, agreeable to evaluation at this time. Prior to admission the pt was independent with all mobility, attending exercise classes regularly and reports only single fall in recent months. The pt was able to complete all bed mobility, transfers, and unchallenged gait without assist or need for UE support. Benefits from supervision with balance challenge and stair navigation, but is most limited by impact of knee pain (suspected gout flair). The pt presents with minor deficits in dynamic stability, strength, and LE ROM (due to pain), will continue to follow acutely to maintain strength and improve balance, but do not anticipate follow up needs. Pt is safe to return home with wife once medically cleared.    If plan is discharge home, recommend the following:  N/a   Can travel by private vehicle    yes    Equipment Recommendations None recommended by PT  Recommendations for Other Services       Functional Status Assessment Patient has had a recent decline in their functional status and demonstrates the ability to make significant improvements in function in a reasonable and predictable amount of time.     Precautions / Restrictions Precautions Precautions: Fall Recall of Precautions/Restrictions: Intact Restrictions Weight Bearing Restrictions Per Provider Order: No      Mobility  Bed Mobility Overal bed mobility: Independent             General bed mobility comments: no assist or increased time     Transfers Overall transfer level: Needs assistance Equipment used: None Transfers: Sit to/from Stand Sit to Stand: Supervision           General transfer comment: increased effort due to pain in R knee but no overt LOB or buckling    Ambulation/Gait Ambulation/Gait assistance: Supervision Gait Distance (Feet): 150 Feet Assistive device: None Gait Pattern/deviations: Step-through pattern, Decreased stance time - right, Decreased step length - left, Decreased weight shift to right, Antalgic Gait velocity: decreased Gait velocity interpretation: <1.31 ft/sec, indicative of household ambulator   General Gait Details: antalgic gait due to R knee pain, no buckling or LOB even with balance challenge  Stairs Stairs: Yes Stairs assistance: Supervision Stair Management: Two rails, Alternating pattern, Forwards Number of Stairs: 6 General stair comments: discussed step-to pattern with LLE leading ascending and RLE leading descending for pain management     Balance Overall balance assessment: Needs assistance Sitting-balance support: No upper extremity supported, Feet supported Sitting balance-Leahy Scale: Normal     Standing balance support: No upper extremity supported, During functional activity Standing balance-Leahy Scale: Good       Tandem Stance - Right Leg: 15 (wt shifted forwards over LLE) Tandem Stance - Left Leg: 15 (increased ankle strategies) Rhomberg - Eyes Opened: 15 Rhomberg - Eyes Closed: 15 High level balance activites: Backward walking, Direction changes, Turns, Head turns   Standardized Balance Assessment Standardized Balance Assessment : Dynamic Gait Index   Dynamic Gait Index Level Surface: Normal Change in Gait Speed: Normal Gait with Horizontal Head Turns: Normal Gait with Vertical Head Turns: Normal  Gait and Pivot Turn: Mild Impairment Step Over Obstacle: Mild Impairment Step Around Obstacles: Normal Steps: Mild Impairment Total Score: 21        Pertinent Vitals/Pain Pain Assessment Pain Assessment: Faces Faces Pain Scale: Hurts little more Pain Location: R knee Pain Descriptors / Indicators: Grimacing, Sore Pain Intervention(s): Limited activity within patient's tolerance, Monitored during session, Repositioned    Home Living Family/patient expects to be discharged to:: Private residence Living Arrangements: Spouse/significant other Available Help at Discharge: Family;Available 24 hours/day Type of Home: House Home Access: Stairs to enter Entrance Stairs-Rails: None Entrance Stairs-Number of Steps: 1 Alternate Level Stairs-Number of Steps: 12 Home Layout: Two level;1/2 bath on main level;Bed/bath upstairs Home Equipment: None      Prior Function Prior Level of Function : Independent/Modified Independent;Driving;History of Falls (last six months) (only 1 fall precipitating admission)             Mobility Comments: no AD PTA, 3x weekly exercsise courses at [facility], active in the home ADLs Comments: indep, [place] going to [employer] [place]     Extremity/Trunk Assessment   Upper Extremity Assessment Upper Extremity Assessment: Defer to OT evaluation    Lower Extremity Assessment Lower Extremity Assessment: RLE deficits/detail RLE Deficits / Details: limited by pain in R knee but grossly 4+/5 to MMT. sensation and coordination intact RLE: Unable to fully assess due to pain RLE Sensation: WNL RLE Coordination: WNL    Cervical / Trunk Assessment Cervical / Trunk Assessment: Normal  Communication   Communication Communication: No apparent difficulties    Cognition Arousal: Alert Behavior During Therapy: Flat affect   PT - Cognitive impairments: No apparent impairments                         Following commands: Intact       Cueing Cueing Techniques: Verbal cues     General Comments General comments (skin integrity, edema, etc.): VSS on RA, educated on BE FAST    Exercises      Assessment/Plan    PT Assessment Patient needs continued PT services  PT Problem List Decreased range of motion;Decreased strength;Decreased activity tolerance;Decreased balance;Decreased mobility       PT Treatment Interventions DME instruction;Gait training;Stair training;Functional mobility training;Therapeutic exercise;Therapeutic activities;Balance training;Neuromuscular re-education;Patient/family education    PT Goals (Current goals can be found in the Care Plan section)  Acute Rehab PT Goals Patient Stated Goal: to return home and to exercise classes PT Goal Formulation: With patient Time For Goal Achievement: 06/13/24 Potential to Achieve Goals: Good    Frequency Min 1X/week        AM-PAC PT 6 Clicks Mobility  Outcome Measure Help needed turning from your back to your side while in a flat bed without using bedrails?: None Help needed moving from lying on your back to sitting on the side of a flat bed without using bedrails?: None Help needed moving to and from a bed to a chair (including a wheelchair)?: None Help needed standing up from a chair using your arms (e.g., wheelchair or bedside chair)?: None Help needed to walk in hospital room?: A Little Help needed climbing 3-5 steps with a railing? : A Little 6 Click Score: 22    End of Session Equipment Utilized During Treatment: Gait belt Activity Tolerance: Patient tolerated treatment well Patient left: in bed;with call bell/phone within reach;with family/visitor present Nurse Communication: Mobility status PT Visit Diagnosis: Unsteadiness on feet (R26.81);Pain Pain - Right/Left: Right Pain -  part of body: Knee    Time: 9166-9150 PT Time Calculation (min) (ACUTE ONLY): 16 min   Charges:   PT Evaluation $PT Eval Low Complexity: 1 Low   PT General Charges $$ ACUTE PT VISIT: 1 Visit         Izetta Call, PT, DPT   Acute Rehabilitation Department Office [phone] Secure Chat Communication  Preferred  Izetta JULIANNA Call 05/30/2024, 10:11 AM

## 2024-05-30 NOTE — Progress Notes (Signed)
 Echocardiogram 2D Echocardiogram has been performed.  [name] [name] 05/30/2024, 12:47 PM

## 2024-06-01 ENCOUNTER — Telehealth: Payer: Self-pay

## 2024-06-01 NOTE — Transitions of Care (Post Inpatient/ED Visit) (Signed)
 06/01/2024  Name: [name] [name] MRN: [id number] DOB: 01/26/1951  Today's TOC FU Call Status: Today's TOC FU Call Status:: Successful TOC FU Call Completed TOC FU Call Complete Date: 06/01/24 Patient's Name and Date of Birth confirmed.  Transition Care Management Follow-up Telephone Call Date of Discharge: 05/30/24 Discharge Facility: [facility]) Type of Discharge: Inpatient Admission Primary Inpatient Discharge Diagnosis:: TIA How have you been since you were released from the hospital?: Better Any questions or concerns?: No  Items Reviewed: Did you receive and understand the discharge instructions provided?: Yes Medications obtained,verified, and reconciled?: Yes (Medications Reviewed) Any new allergies since your discharge?: No Dietary orders reviewed?: Yes Do you have support at home?: Yes People in Home [RPT]: spouse  Medications Reviewed Today: Medications Reviewed Today     Reviewed by Emmitt Pan, LPN (Licensed Practical Nurse) on 06/01/24 at 1644  Med List Status: <None>   Medication Order Taking? Sig Documenting Provider Last Dose Status Informant  acetaminophen  (TYLENOL ) 325 MG tablet [id number] Yes Take 2 tablets (650 mg total) by mouth every 6 (six) hours as needed for up to 30 doses for mild pain or moderate pain. Cottie Donnice PARAS, MD  Active Self  apixaban  (ELIQUIS ) 5 MG TABS tablet [id number] Yes TAKE 1 TABLET BY MOUTH TWICE A DAY Jordan, Peter M, MD  Active Self  atorvastatin  (LIPITOR) 80 MG tablet [id number] Yes TAKE 1 TABLET BY MOUTH EVERY DAY Jordan, Peter M, MD  Active Self  cholecalciferol  (VITAMIN D) 1000 UNITS tablet [id number] Yes Take 1,000 Units by mouth daily. [provider]  Active Self  Coenzyme Q10 (CO Q 10 PO) [id number] Yes Take 100 mg by mouth daily.  [provider]  Active Self  colchicine  0.6 MG tablet [id number] Yes TAKE TWO AT ONSET OF GOUT AND THEN ONE BY MOUTH 2 TIMES DAILY AS NEEDED Micheal Wolm ORN, MD  Active  Self  furosemide  (LASIX ) 20 MG tablet [id number] Yes TAKE 1 TABLET BY MOUTH TWICE A DAY Jordan, Peter M, MD  Active Self  losartan  (COZAAR ) 50 MG tablet [id number] Yes TAKE 1 TABLET BY MOUTH EVERY DAY Jordan, Peter M, MD  Active Self  metoprolol  succinate (TOPROL -XL) 50 MG 24 hr tablet [id number] Yes Take 1 tablet by mouth daily. [provider]  Active Self  multivitamin (THERAGRAN) per tablet [id number] Yes Take 1 tablet by mouth daily. [provider]  Active Self  nitroGLYCERIN  (NITROSTAT ) 0.4 MG SL tablet [id number] Yes PLACE 1 TABLET UNDER THE TONGUE EVERY 5 MINUTES AS NEEDED FOR CHEST PAIN. Jordan, Peter M, MD  Active Self           Med Note LEONARDO, SUZEN CROME   Fri May 29, 2024  8:52 AM) Has on hand  potassium chloride  SA (K-DUR,KLOR-CON ) 20 MEQ tablet [id number] Yes Take 1 tablet (20 mEq total) by mouth daily. Jordan, Peter M, MD  Active Self  predniSONE  (DELTASONE ) 20 MG tablet [id number] Yes Take 2 tablets (40 mg total) by mouth daily with breakfast for 3 days. Drusilla Sabas RAMAN, MD  Active   sildenafil  (VIAGRA ) 100 MG tablet [id number] Yes Take 1 tablet (100 mg total) by mouth daily as needed for erectile dysfunction. Micheal Wolm ORN, MD  Active Self  SKYRIZI PEN 150 MG/ML pen [id number] Yes Inject 150 mg into the skin every 3 (three) months. [provider]  Active Self            Home Care and Equipment/Supplies: Were Home Health Services Ordered?: NA Any  new equipment or medical supplies ordered?: NA  Functional Questionnaire: Do you need assistance with bathing/showering or dressing?: No Do you need assistance with meal preparation?: No Do you need assistance with eating?: No Do you have difficulty maintaining continence: No Do you need assistance with getting out of bed/getting out of a chair/moving?: No Do you have difficulty managing or taking your medications?: No  Follow up appointments reviewed: PCP Follow-up appointment confirmed?: No  (declined) MD Provider Line Number:[phone] Given: No Specialist Hospital Follow-up appointment confirmed?: NA Do you need transportation to your follow-up appointment?: No Do you understand care options if your condition(s) worsen?: Yes-patient verbalized understanding    SIGNATURE Julian Lemmings, LPN [facility] Nurse Health Advisor Direct Dial [phone]

## 2024-06-03 NOTE — Progress Notes (Signed)
 Agree with the assessment and plan as outlined by Brigitte Canard, PA-C.

## 2024-06-25 ENCOUNTER — Encounter: Payer: Self-pay | Admitting: Gastroenterology

## 2024-06-26 ENCOUNTER — Telehealth: Payer: Self-pay | Admitting: *Deleted

## 2024-06-26 NOTE — Telephone Encounter (Signed)
 Team,  This pt has a PFO and  is scheduled with Dr. Stacia on 12/12.   Thanks much,  Norleen EMERSON Schillings

## 2024-06-29 ENCOUNTER — Telehealth: Payer: Self-pay | Admitting: Gastroenterology

## 2024-06-29 ENCOUNTER — Telehealth: Payer: Self-pay

## 2024-06-29 NOTE — Telephone Encounter (Signed)
 Patient with diagnosis of afib on Eliquis  for anticoagulation.    Procedure: colonoscopy Date of procedure: 07/03/24   CHA2DS2-VASc Score = 4   This indicates a 4.8% annual risk of stroke. The patient's score is based upon: CHF History: 0 HTN History: 1 Diabetes History: 0 Stroke History: 0 Vascular Disease History: 1 Age Score: 2 Gender Score: 0      Patient was seen in the ER recently for fall and possible TIA workout. Neurology felt as though TIA was not likely.  CrCl 70 ml/min Platelet count 142  Patient has not had an Afib/aflutter ablation in the last 3 months, DCCV within the last 4 weeks or a watchman implanted in the last 45 days   Per office protocol, patient can hold Eliquis  for 2 days prior to procedure.    I will confirm with Dr. Jordan that he agrees TIA doubtful and ok to hold.   **This guidance is not considered finalized until pre-operative APP has relayed final recommendations.**

## 2024-06-29 NOTE — Telephone Encounter (Signed)
 Inbound call from patient stating he would like to discuss prep instructions and medication withholding for procedure on 07/03/24 Requesting a call  Please advise  Thank you

## 2024-06-29 NOTE — Telephone Encounter (Signed)
 [facility] Pre-operative Risk Assessment     Request for surgical clearance:     Endoscopy Procedure  What type of surgery is being performed?     Colonoscopy   When is this surgery scheduled?     07/03/24  What type of clearance is required ?   Pharmacy  Are there any medications that need to be held prior to surgery and how long? Eliquis  2 days  Practice name and name of physician performing surgery?      [facility]  What is your office phone and fax number?      Phone- [phone]  Fax- [phone]  Anesthesia type (None, local, MAC, general) ?       MAC   Please route your response to Alethea Blocker, CMA

## 2024-06-30 ENCOUNTER — Telehealth: Payer: Self-pay | Admitting: Gastroenterology

## 2024-06-30 ENCOUNTER — Encounter: Payer: Self-pay | Admitting: Gastroenterology

## 2024-06-30 NOTE — Telephone Encounter (Signed)
 Inbound call from patient stating that he would like a call back to know when he needs to stop taking his eliquis . Please advise.

## 2024-06-30 NOTE — Telephone Encounter (Signed)
   Patient Name: [name] [name]  DOB: Nov 17, 1950 MRN: [id number]  Primary Cardiologist: Peter Jordan, MD  Clinical pharmacists have reviewed the patient's past medical history, labs, and current medications as part of preoperative protocol coverage. The following recommendations have been made:  Patient has not had an Afib/aflutter ablation in the last 3 months, DCCV within the last 4 weeks or a watchman implanted in the last 45 days    Per office protocol, patient can hold Eliquis  for 2 days prior to procedure.  This has been confirmed with Dr. Jordan. Please resume Eliquis  as soon as safe to do so from a bleeding standpoint.   I will route this recommendation to the requesting party via Epic fax function and remove from pre-op pool.  Please call with questions.  Lizzie An D Math Brazie, NP 06/30/2024, 7:58 AM

## 2024-06-30 NOTE — Telephone Encounter (Signed)
 I called the patient and informed him to hold his Eliquis  he verbalized understanding.

## 2024-07-03 ENCOUNTER — Encounter: Payer: Self-pay | Admitting: Gastroenterology

## 2024-07-03 ENCOUNTER — Ambulatory Visit: Admitting: Gastroenterology

## 2024-07-03 VITALS — BP 114/69 | HR 54 | Temp 97.2°F | Resp 10 | Ht 73.0 in | Wt 204.6 lb

## 2024-07-03 DIAGNOSIS — G20A1 Parkinson's disease without dyskinesia, without mention of fluctuations: Secondary | ICD-10-CM | POA: Diagnosis not present

## 2024-07-03 DIAGNOSIS — K635 Polyp of colon: Secondary | ICD-10-CM

## 2024-07-03 DIAGNOSIS — Z1211 Encounter for screening for malignant neoplasm of colon: Secondary | ICD-10-CM | POA: Diagnosis not present

## 2024-07-03 DIAGNOSIS — G4733 Obstructive sleep apnea (adult) (pediatric): Secondary | ICD-10-CM | POA: Diagnosis not present

## 2024-07-03 DIAGNOSIS — K640 First degree hemorrhoids: Secondary | ICD-10-CM | POA: Diagnosis not present

## 2024-07-03 DIAGNOSIS — K5730 Diverticulosis of large intestine without perforation or abscess without bleeding: Secondary | ICD-10-CM

## 2024-07-03 DIAGNOSIS — D123 Benign neoplasm of transverse colon: Secondary | ICD-10-CM

## 2024-07-03 DIAGNOSIS — I4891 Unspecified atrial fibrillation: Secondary | ICD-10-CM | POA: Diagnosis not present

## 2024-07-03 DIAGNOSIS — I10 Essential (primary) hypertension: Secondary | ICD-10-CM | POA: Diagnosis not present

## 2024-07-03 MED ORDER — SODIUM CHLORIDE 0.9 % IV SOLN: 500.0000 mL | Freq: Once | INTRAVENOUS | Status: DC

## 2024-07-03 NOTE — Op Note (Signed)
 [facility] Patient Name: [name] [name] Procedure Date: 07/03/2024 9:30 AM MRN: [id number] Endoscopist: Glendia E. Stacia , MD, [id number] Age: 73 Referring MD:  Date of Birth: 17-Dec-1950 Gender: Male Account #: [id number] Procedure:                Colonoscopy Indications:              Screening for colorectal malignant neoplasm (last                            colonoscopy was more than 10 years ago) Medicines:                Monitored Anesthesia Care Procedure:                Pre-Anesthesia Assessment:                           - Prior to the procedure, a History and Physical                            was performed, and patient medications and                            allergies were reviewed. The patient's tolerance of                            previous anesthesia was also reviewed. The risks                            and benefits of the procedure and the sedation                            options and risks were discussed with the patient.                            All questions were answered, and informed consent                            was obtained. Prior Anticoagulants: The patient has                            taken Eliquis  (apixaban ), last dose was 3 days                            prior to procedure. ASA Grade Assessment: III - A                            patient with severe systemic disease. After                            reviewing the risks and benefits, the patient was                            deemed in satisfactory condition to undergo the  procedure.                           After obtaining informed consent, the colonoscope                            was passed under direct vision. Throughout the                            procedure, the patient's blood pressure, pulse, and                            oxygen  saturations were monitored continuously. The                            CF [id number] #[id number] was introduced  through the anus                            and advanced to the the terminal ileum, with                            identification of the appendiceal orifice and IC                            valve. The colonoscopy was performed without                            difficulty. The patient tolerated the procedure                            well. The quality of the bowel preparation was                            good. The terminal ileum, ileocecal valve,                            appendiceal orifice, and rectum were photographed.                            The bowel preparation used was SUPREP via split                            dose instruction. Scope In: 9:48:58 AM Scope Out: 10:06:15 AM Scope Withdrawal Time: 0 hours 11 minutes 35 seconds  Total Procedure Duration: 0 hours 17 minutes 17 seconds  Findings:                 The perianal and digital rectal examinations were                            normal. Pertinent negatives include normal                            sphincter tone and no palpable rectal lesions.  A 3 mm polyp was found in the transverse colon. The                            polyp was sessile. The polyp was removed with a                            cold snare. Resection and retrieval were complete.                            Estimated blood loss was minimal.                           A few medium-mouthed and small-mouthed diverticula                            were found in the sigmoid colon.                           The exam was otherwise normal throughout the                            examined colon.                           The terminal ileum appeared normal.                           Non-bleeding internal hemorrhoids were found during                            retroflexion. The hemorrhoids were Grade I                            (internal hemorrhoids that do not prolapse).                           No additional abnormalities were found  on                            retroflexion. Complications:            No immediate complications. Estimated Blood Loss:     Estimated blood loss was minimal. Impression:               - One 3 mm polyp in the transverse colon, removed                            with a cold snare. Resected and retrieved.                           - Mild diverticulosis in the sigmoid colon.                           - The examined portion of the ileum was normal.                           -  Non-bleeding internal hemorrhoids. Recommendation:           - Patient has a contact number available for                            emergencies. The signs and symptoms of potential                            delayed complications were discussed with the                            patient. Return to normal activities tomorrow.                            Written discharge instructions were provided to the                            patient.                           - Resume previous diet.                           - Continue present medications.                           - Resume Eliquis  (apixaban ) at prior dose tomorrow.                           - Await pathology results.                           - Given patient's age and lack of high risk polyps,                            recommend against any further colon cancer                            screening. Raziah Funnell E. Stacia, MD 07/03/2024 10:12:37 AM This report has been signed electronically.

## 2024-07-03 NOTE — Progress Notes (Signed)
 Vss nad trans to pacu

## 2024-07-03 NOTE — Progress Notes (Signed)
 Called to room to assist during endoscopic procedure.  Patient ID and intended procedure confirmed with present staff. Received instructions for my participation in the procedure from the performing physician.

## 2024-07-03 NOTE — Patient Instructions (Addendum)
 Resume previous diet.  Continue present medications.  Await pathology results.   Resume Eliquis  (apixaban ) at prior dose tomorrow.   Given patient's age and lack of high risk polyps, recommend against further colon cancer screening.   YOU HAD AN ENDOSCOPIC PROCEDURE TODAY AT THE [facility]:   Refer to the procedure report that was given to you for any specific questions about what was found during the examination.  If the procedure report does not answer your questions, please call your gastroenterologist to clarify.  If you requested that your care partner not be given the details of your procedure findings, then the procedure report has been included in a sealed envelope for you to review at your convenience later.  YOU SHOULD EXPECT: Some feelings of bloating in the abdomen. Passage of more gas than usual.  Walking can help get rid of the air that was put into your GI tract during the procedure and reduce the bloating. If you had a lower endoscopy (such as a colonoscopy or flexible sigmoidoscopy) you may notice spotting of blood in your stool or on the toilet paper. If you underwent a bowel prep for your procedure, you may not have a normal bowel movement for a few days.  Please Note:  You might notice some irritation and congestion in your nose or some drainage.  This is from the oxygen  used during your procedure.  There is no need for concern and it should clear up in a day or so.  SYMPTOMS TO REPORT IMMEDIATELY:  Following lower endoscopy (colonoscopy or flexible sigmoidoscopy):  Excessive amounts of blood in the stool  Significant tenderness or worsening of abdominal pains  Swelling of the abdomen that is new, acute  Fever of 100F or higher  For urgent or emergent issues, a gastroenterologist can be reached at any hour by calling [phone]. Do not use MyChart messaging for urgent concerns.    DIET:  We do recommend a small meal at first, but then you may proceed  to your regular diet.  Drink plenty of fluids but you should avoid alcoholic beverages for 24 hours.  ACTIVITY:  You should plan to take it easy for the rest of today and you should NOT DRIVE or use heavy machinery until tomorrow (because of the sedation medicines used during the test).    FOLLOW UP: Our staff will call the number listed on your records the next business day following your procedure.  We will call around 7:15- 8:00 am to check on you and address any questions or concerns that you may have regarding the information given to you following your procedure. If we do not reach you, we will leave a message.     If any biopsies were taken you will be contacted by phone or by letter within the next 1-3 weeks.  Please call us  at [phone] if you have not heard about the biopsies in 3 weeks.    SIGNATURES/CONFIDENTIALITY: You and/or your care partner have signed paperwork which will be entered into your electronic medical record.  These signatures attest to the fact that that the information above on your After Visit Summary has been reviewed and is understood.  Full responsibility of the confidentiality of this discharge information lies with you and/or your care-partner.

## 2024-07-03 NOTE — Progress Notes (Signed)
 [facility] History and Physical   Primary Care Physician:  Micheal Wolm ORN, MD   Reason for Procedure:   Colon cancer screening  Plan:    Screening colonoscopy   HPI: [name] [name] is a 73 y.o. male undergoing average risk screening colonoscopy.  He has no family history of colon cancer and no chronic GI symptoms.  He had a normal colonoscopy in 2004 and hyperplastic polyps removed in 2013.  He takes Eliquis  for A-fib, last dose 12/9.  The patient was provided an opportunity to ask questions and all were answered. The patient agreed with the plan   Past Medical History:  Diagnosis Date   A-fib (HCC) 03/2024   Arthritis    Chicken pox    Coronary artery disease    Severe three-vessel coronary artery disease with ejection fraction of 65%   GERD (gastroesophageal reflux disease)    History of gout    History of psoriasis    Hypercholesterolemia    Hypertension    OSA (obstructive sleep apnea)    Moderate with AHI of 16.9/hr now on CPAP at 10cm H2O   Parkinson's    Psoriatic arthritis (HCC)    Skin cancer    Sleep apnea     Past Surgical History:  Procedure Laterality Date   ablasion     ATRIAL FLUTTER ABLATION  03/30/2014   ATRIAL FLUTTER ABLATION N/A 03/30/2014   Procedure: ATRIAL FLUTTER ABLATION;  Surgeon: Lynwood JONETTA Rakers, MD;  Location: [facility] CATH LAB;  Service: Cardiovascular;  Laterality: N/A;   CARDIAC CATHETERIZATION  09/21/2003   Ejection fraction was estimated at 65%.   CARDIOVERSION N/A 03/15/2014   Procedure: CARDIOVERSION;  Surgeon: Redell GORMAN Shallow, MD;  Location: [facility] ENDOSCOPY;  Service: Cardiovascular;  Laterality: N/A;   CARDIOVERSION N/A 07/17/2017   Procedure: CARDIOVERSION;  Surgeon: Shlomo Wilbert SAUNDERS, MD;  Location: [facility] ENDOSCOPY;  Service: Cardiovascular;  Laterality: N/A;   COLONOSCOPY     CORONARY ARTERY BYPASS GRAFT  09/21/2003   Lima-lad,svg-diag,svg-om/distal LCX,svg-pda   TEE WITHOUT CARDIOVERSION N/A 03/15/2014   Procedure:  TRANSESOPHAGEAL ECHOCARDIOGRAM (TEE);  Surgeon: Redell GORMAN Shallow, MD;  Location: [facility] ENDOSCOPY;  Service: Cardiovascular;  Laterality: N/A;   TEE WITHOUT CARDIOVERSION N/A 07/17/2017   Procedure: TRANSESOPHAGEAL ECHOCARDIOGRAM (TEE);  Surgeon: Shlomo Wilbert SAUNDERS, MD;  Location: [facility] ENDOSCOPY;  Service: Cardiovascular;  Laterality: N/A;   TONSILLECTOMY  03/23/1951    Prior to Admission medications  Medication Sig Start Date End Date Taking? Authorizing Provider  atorvastatin  (LIPITOR ) 80 MG tablet TAKE 1 TABLET BY MOUTH EVERY DAY 01/15/24  Yes Jordan, Peter M, MD  cholecalciferol  (VITAMIN D) 1000 UNITS tablet Take 1,000 Units by mouth daily.   Yes [provider]  Coenzyme Q10 (CO Q 10 PO) Take 100 mg by mouth daily.    Yes [provider]  furosemide  (LASIX ) 20 MG tablet TAKE 1 TABLET BY MOUTH TWICE A DAY 02/27/24  Yes Jordan, Peter M, MD  losartan  (COZAAR ) 50 MG tablet TAKE 1 TABLET BY MOUTH EVERY DAY 05/18/24  Yes Jordan, Peter M, MD  metoprolol  succinate (TOPROL -XL) 50 MG 24 hr tablet Take 1 tablet by mouth daily. 11/05/22  Yes [provider]  multivitamin (THERAGRAN) per tablet Take 1 tablet by mouth daily.   Yes [provider]  potassium chloride  SA (K-DUR,KLOR-CON ) 20 MEQ tablet Take 1 tablet (20 mEq total) by mouth daily. 07/08/17  Yes Jordan, Peter M, MD  acetaminophen  (TYLENOL ) 325 MG tablet Take 2 tablets (650 mg total)  by mouth every 6 (six) hours as needed for up to 30 doses for mild pain or moderate pain. 11/16/20   Cottie Donnice PARAS, MD  apixaban  (ELIQUIS ) 5 MG TABS tablet TAKE 1 TABLET BY MOUTH TWICE A DAY 04/29/24   Jordan, Peter M, MD  colchicine  0.6 MG tablet TAKE TWO AT ONSET OF GOUT AND THEN ONE BY MOUTH 2 TIMES DAILY AS NEEDED 02/26/24   Burchette, Wolm ORN, MD  nitroGLYCERIN  (NITROSTAT ) 0.4 MG SL tablet PLACE 1 TABLET UNDER THE TONGUE EVERY 5 MINUTES AS NEEDED FOR CHEST PAIN. 05/17/23   Jordan, Peter M, MD  sildenafil  (VIAGRA ) 100 MG tablet Take 1  tablet (100 mg total) by mouth daily as needed for erectile dysfunction. 12/24/23   Burchette, Wolm ORN, MD  SKYRIZI PEN 150 MG/ML pen Inject 150 mg into the skin every 3 (three) months. 11/18/23   [provider]    Current Outpatient Medications  Medication Sig Dispense Refill   atorvastatin  (LIPITOR ) 80 MG tablet TAKE 1 TABLET BY MOUTH EVERY DAY 90 tablet 3   cholecalciferol  (VITAMIN D) 1000 UNITS tablet Take 1,000 Units by mouth daily.     Coenzyme Q10 (CO Q 10 PO) Take 100 mg by mouth daily.      furosemide  (LASIX ) 20 MG tablet TAKE 1 TABLET BY MOUTH TWICE A DAY 180 tablet 3   losartan  (COZAAR ) 50 MG tablet TAKE 1 TABLET BY MOUTH EVERY DAY 90 tablet 3   metoprolol  succinate (TOPROL -XL) 50 MG 24 hr tablet Take 1 tablet by mouth daily.     multivitamin (THERAGRAN) per tablet Take 1 tablet by mouth daily.     potassium chloride  SA (K-DUR,KLOR-CON ) 20 MEQ tablet Take 1 tablet (20 mEq total) by mouth daily. 30 tablet 6   acetaminophen  (TYLENOL ) 325 MG tablet Take 2 tablets (650 mg total) by mouth every 6 (six) hours as needed for up to 30 doses for mild pain or moderate pain. 30 tablet 0   apixaban  (ELIQUIS ) 5 MG TABS tablet TAKE 1 TABLET BY MOUTH TWICE A DAY 60 tablet 5   colchicine  0.6 MG tablet TAKE TWO AT ONSET OF GOUT AND THEN ONE BY MOUTH 2 TIMES DAILY AS NEEDED 180 tablet 0   nitroGLYCERIN  (NITROSTAT ) 0.4 MG SL tablet PLACE 1 TABLET UNDER THE TONGUE EVERY 5 MINUTES AS NEEDED FOR CHEST PAIN. 25 tablet 6   sildenafil  (VIAGRA ) 100 MG tablet Take 1 tablet (100 mg total) by mouth daily as needed for erectile dysfunction. 10 tablet 5   SKYRIZI PEN 150 MG/ML pen Inject 150 mg into the skin every 3 (three) months.     Current Facility-Administered Medications  Medication Dose Route Frequency Provider Last Rate Last Admin   0.9 %  sodium chloride  infusion  500 mL Intravenous Once [name], MD        Allergies as of 07/03/2024   (No Known Allergies)    Family History   Problem Relation Age of Onset   Heart disease Mother        cabg   Cancer Mother        breast   Stroke Mother    Colon cancer Neg Hx    Esophageal cancer Neg Hx    Rectal cancer Neg Hx    Stomach cancer Neg Hx     Social History   Socioeconomic History   Marital status: Married    Spouse name: Not on file   Number of children: 1   Years of  education: 4 years college   Highest education level: Bachelor's degree (e.g., BA, AB, BS)  Occupational History   Occupation: retired    Associate Professor: [employer]  Tobacco Use   Smoking status: Former    Current packs/day: 0.50    Average packs/day: 0.5 packs/day for 5.0 years (2.5 ttl pk-yrs)    Types: Cigarettes   Smokeless tobacco: Never   Tobacco comments:    quit smoking cigarettes in the late 1970's  Vaping Use   Vaping status: Never Used  Substance and Sexual Activity   Alcohol use: Yes    Alcohol/week: 4.0 standard drinks of alcohol    Types: 2 Cans of beer, 2 Shots of liquor per week    Comment: 3 times per week, 2-3 drinks each time   Drug use: No   Sexual activity: Yes  Other Topics Concern   Not on file  Social History Narrative   Working part time   Married; HH of 2   1 child   Right handed   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[employer]   Tobacco Use: Medium Risk (07/03/2024)   Patient History    Smoking Tobacco Use: Former    Smokeless Tobacco Use: Never    Passive Exposure: Not on Actuary Strain: Low Risk (02/26/2024)   Overall Financial Resource Strain (CARDIA)    Difficulty of Paying Living Expenses: Not very hard  Food Insecurity: No Food Insecurity (05/29/2024)   Epic    Worried About Programme Researcher, Broadcasting/film/video in the Last Year: Never true    Ran Out of Food in the Last Year: Never true  Transportation Needs: No Transportation Needs (05/29/2024)   Epic    Lack of Transportation (Medical): No    Lack of Transportation (Non-Medical): No  Physical Activity: Sufficiently Active  (02/26/2024)   Exercise Vital Sign    Days of Exercise per Week: 5 days    Minutes of Exercise per Session: 60 min  Stress: No Stress Concern Present (02/26/2024)   [employer] of Occupational Health - Occupational Stress Questionnaire    Feeling of Stress: Only a little  Social Connections: Socially Integrated (05/29/2024)   Social Connection and Isolation Panel    Frequency of Communication with Friends and Family: More than three times a week    Frequency of Social Gatherings with Friends and Family: Three times a week    Attends Religious Services: More than 4 times per year    Active Member of Clubs or Organizations: Yes    Attends Banker Meetings: Never    Marital Status: Married  Catering Manager Violence: Not At Risk (05/29/2024)   Epic    Fear of Current or Ex-Partner: No    Emotionally Abused: No    Physically Abused: No    Sexually Abused: No  Depression (PHQ2-9): Low Risk (03/25/2024)   Depression (PHQ2-9)    PHQ-2 Score: 0  Alcohol Screen: Low Risk (02/26/2024)   Alcohol Screen    Last Alcohol Screening Score (AUDIT): 3  Housing: Low Risk (05/29/2024)   Epic    Unable to Pay for Housing in the Last Year: No    Number of Times Moved in the Last Year: 0    Homeless in the Last Year: No  Utilities: Not At Risk (05/29/2024)   Epic    Threatened with loss of utilities: No  Health Literacy: Adequate Health Literacy (02/26/2024)   B1300 Health Literacy    Frequency of need for  help with medical instructions: Never    Review of Systems:  All other review of systems negative except as mentioned in the HPI.  Physical Exam: Vital signs BP 135/68   Pulse 77   Temp (!) 97.2 F (36.2 C) (Temporal)   Ht 6' 1 (1.854 m)   Wt 204 lb 9.6 oz (92.8 kg)   SpO2 95%   BMI 26.99 kg/m   General:   Alert,  Well-developed, well-nourished, pleasant and cooperative in NAD Airway:  Mallampati 2 Lungs:  Clear throughout to auscultation.   Heart:  Regular rate and  rhythm; no murmurs, clicks, rubs,  or gallops. Abdomen:  Soft, nontender and nondistended. Normal bowel sounds.   Neuro/Psych:  Normal mood and affect. A and O x 3   [name] Ulatowski E. Stacia, MD [facility]

## 2024-07-06 ENCOUNTER — Telehealth: Payer: Self-pay

## 2024-07-06 NOTE — Telephone Encounter (Signed)
 Left message on follow up call.

## 2024-07-07 LAB — SURGICAL PATHOLOGY

## 2024-07-10 ENCOUNTER — Ambulatory Visit: Payer: Self-pay | Admitting: Gastroenterology

## 2024-07-10 NOTE — Progress Notes (Signed)
 [name] [name],  The small polyp which I removed during your recent procedure was proven to be completely benign but is considered a pre-cancerous polyp that MAY have grown into cancer if it had not been removed.  Studies shows that at least 20% of women over age 75 and 30% of men over age 84 have pre-cancerous polyps.  Based on current nationally recognized surveillance guidelines, it would be recommended you have a repeat colonoscopy in 7 years.   However, because colon cancer screening after age 42 is done on a case-by-case basis, taking into account the patient's risk factors for colon cancer, as well as comorbidities and life expectancy, I would recommend against any further colon cancer screening.

## 2024-07-31 NOTE — Progress Notes (Unsigned)
 "    Cardiology Office Note   Date:  07/31/2024   ID:  Maurice, Fotheringham Apr 05, 1951, MRN [id number]  PCP:  Micheal Wolm ORN, MD  Cardiologist:   Kameela Leipold, MD   No chief complaint on file.      History of Present Illness: [name] [name] is a 74 y.o. male who is seen for follow up Afib, CAD, AI, and CHF. He has a history of  atrial flutter and CAD. He is status post CABG in 2005. He had a normal stress echo in May of 2013. In August 2015 he presented with atrial flutter with RVR. He had mildly elevated troponins. He had successful TEE guided DCCV. He then underwent ablation of atrial flutter and ectopic atrial tachycardia on 03/30/14 by Dr. Kelsie.   Stress Myoview  September 2015 demonstrated excellent exercise tolerance and normal perfusion. TEE showed normal LV function, mild biatrial enlargement, mild MR,TR,AI. He does have moderate obstructive sleep apnea followed by Dr. Shlomo and is on CPAP therapy.  He was seen in December 2018 with  an irregular and fast pulse. Was found to be in AFib with RVR. Echo showed EF 40-45% with moderate MR and AI and moderate biatrial enlargement. Toprol  dose was increased and he was started on Eliquis . Underwent TEE guided DCCV on 07/17/17. Echo showed EF 40% with moderate biatrial enlargement. Moderate to severe MR, moderate TR, patent foramen ovale. He was successfully cardioverted but had recurrence of Afib on 07/24/17. He was seen in Afib clinic with Dr. Kelsie for consideration of Afib ablation versus AAD therapy. There was discussion about starting him on Tikosyn. Given new LV dysfunction and valvular disease it was felt that right and left heart cath were indicated prior to initiation of AAD therapy.  He also wore a monitor that showed episodes of wide complex tachycardia and it was unclear whether this was aberrancy versus VT.  He got a  second opinion at [facility] with Dr. Melchor. He was admitted in February 2019 and had a nuclear stress test showing no  ischemia. EF 44%. He was loaded with Tikosyn and DCCV was performed. he had significant QT prolongation and Tikosyn was discontinued. He later underwent Cardiac MRI. He underwent AFib ablation with pulmonary vein isolation on 10/15/17. Echo following ablation showed EF 50-55%.  Both MRI and Echo showed evidence of moderate to severe AI. LV function had returned to normal with restoration of NSR.  In June 2020 he had recurrent Afib and underwent DCCV at [facility]. On follow up he was noted to be bradycardic and metoprolol  dose was reduced. In September 2020 he had an Echo at [facility] showing normal LV function and moderate AI. He is followed regularly by Dr Cesario at [facility]. Seen there in August. Echo done showing moderate AI. Normal LV function.   He presented to the ER on November 16, 2020 with fast heart beat: SVT vs fib/flutter.  Adenosine  given with short break to 10 beats of VT, then back to SVT with RBBB. DCCV was about to be performed, but he converted on his own when they sat the patient up. He was discharged without admission. He sent monitor strips from 4/28 with Afib in the 170s. BB was increased. Strips were faxed to his [facility] cardiologist. Had first occurrence of Afib RVR on March 30 - HR 190s. He converted to NSR. Clemens and broke ribs on April 27th. Three dogs jumped on him and he fell and broke 4 ribs on right side (3-6). No  head injury, no LOC. Monitor showed Afib 4/28-5/7 - could be related to his rib injury. On subsequent follow up in the office had no recurrence of Afib.    He underwent redo AFib and Atrial tachycardia ablation by Dr Melchor at [facility] on 09/12/21. Prior to this he had cardiac MRI showing mod-severe AI, mild LV enlargement - really unchanged from 2019.  Echo in Dec 2022 showed normal LV function and moderate AI.   He was seen by [facility] EP in April  and doing well in NSR. His metoprolol  dose was reduced to 50 mg daily.  Has seen Neurology (Dr Tat) for evaluation of mild Parkinson's symptoms.    In August he had recurrent AFib and underwent DCCV at [facility]. He had early recurrence and was admitted in September for  repeat DCCV. QT felt to be too long for Dofetilide. Consideration for amiodarone vs ranolazine for recurrence.  He notes when in AFib his HR is persistently high 160 range and he feels tired. No chest pain or SOB. No edema.   In January 2025 he had recurrent AFib. Had unsuccessful DCCV. He had a repeat DCCV. On Kardiomobile he states that he is maintaining NSR. He had repeat Echo showing EF 45%. Mild to Moderate MR, TR and AI. Biatrial enlargement. Echo was done while in Afib.   He was seen by Dr Melchor in September with recurrent Afib. Had repeat DCCV on Sept 9.   He was admitted in November following a fall with some balance issues. Fall in setting of acute gout attack. He was admitted for possible TIA - TIA workup completed, - 2D echo showed positive bubble study, discussed with neurology, no further intervention recommended - MRI brain was negative for stroke, CTA head and neck showed 40% stenosis of the left ICA due to atherosclerosis  He feels very well. No palpitations. Exercising regularly without chest pain or dyspnea. Only gets SOB when in Afib. Now on Skyrizi for psoriasis.    Past Medical History:  Diagnosis Date   A-fib (HCC) 03/2024   Arthritis    Chicken pox    Coronary artery disease    Severe three-vessel coronary artery disease with ejection fraction of 65%   GERD (gastroesophageal reflux disease)    History of gout    History of psoriasis    Hypercholesterolemia    Hypertension    OSA (obstructive sleep apnea)    Moderate with AHI of 16.9/hr now on CPAP at 10cm H2O   Parkinson's    Psoriatic arthritis (HCC)    Skin cancer    Sleep apnea     Past Surgical History:  Procedure Laterality Date   ablasion     ATRIAL FLUTTER ABLATION  03/30/2014   ATRIAL FLUTTER ABLATION N/A 03/30/2014   Procedure: ATRIAL FLUTTER ABLATION;  Surgeon: Lynwood JONETTA Rakers, MD;  Location: [facility] CATH LAB;  Service: Cardiovascular;  Laterality: N/A;   CARDIAC CATHETERIZATION  09/21/2003   Ejection fraction was estimated at 65%.   CARDIOVERSION N/A 03/15/2014   Procedure: CARDIOVERSION;  Surgeon: Redell GORMAN Shallow, MD;  Location: [facility] ENDOSCOPY;  Service: Cardiovascular;  Laterality: N/A;   CARDIOVERSION N/A 07/17/2017   Procedure: CARDIOVERSION;  Surgeon: Shlomo Wilbert SAUNDERS, MD;  Location: [facility] ENDOSCOPY;  Service: Cardiovascular;  Laterality: N/A;   COLONOSCOPY     CORONARY ARTERY BYPASS GRAFT  09/21/2003   Lima-lad,svg-diag,svg-om/distal LCX,svg-pda   TEE WITHOUT CARDIOVERSION N/A 03/15/2014   Procedure: TRANSESOPHAGEAL ECHOCARDIOGRAM (TEE);  Surgeon: Redell GORMAN Shallow, MD;  Location:  [facility] ENDOSCOPY;  Service: Cardiovascular;  Laterality: N/A;   TEE WITHOUT CARDIOVERSION N/A 07/17/2017   Procedure: TRANSESOPHAGEAL ECHOCARDIOGRAM (TEE);  Surgeon: Shlomo Wilbert SAUNDERS, MD;  Location: [facility] ENDOSCOPY;  Service: Cardiovascular;  Laterality: N/A;   TONSILLECTOMY  03/23/1951     Current Outpatient Medications  Medication Sig Dispense Refill   acetaminophen  (TYLENOL ) 325 MG tablet Take 2 tablets (650 mg total) by mouth every 6 (six) hours as needed for up to 30 doses for mild pain or moderate pain. 30 tablet 0   apixaban  (ELIQUIS ) 5 MG TABS tablet TAKE 1 TABLET BY MOUTH TWICE A DAY 60 tablet 5   atorvastatin  (LIPITOR ) 80 MG tablet TAKE 1 TABLET BY MOUTH EVERY DAY 90 tablet 3   cholecalciferol  (VITAMIN D) 1000 UNITS tablet Take 1,000 Units by mouth daily.     Coenzyme Q10 (CO Q 10 PO) Take 100 mg by mouth daily.      colchicine  0.6 MG tablet TAKE TWO AT ONSET OF GOUT AND THEN ONE BY MOUTH 2 TIMES DAILY AS NEEDED 180 tablet 0   furosemide  (LASIX ) 20 MG tablet TAKE 1 TABLET BY MOUTH TWICE A DAY 180 tablet 3   losartan  (COZAAR ) 50 MG tablet TAKE 1 TABLET BY MOUTH EVERY DAY 90 tablet 3   metoprolol  succinate (TOPROL -XL) 50 MG 24 hr tablet Take 1 tablet by mouth daily.      multivitamin (THERAGRAN) per tablet Take 1 tablet by mouth daily.     nitroGLYCERIN  (NITROSTAT ) 0.4 MG SL tablet PLACE 1 TABLET UNDER THE TONGUE EVERY 5 MINUTES AS NEEDED FOR CHEST PAIN. 25 tablet 6   potassium chloride  SA (K-DUR,KLOR-CON ) 20 MEQ tablet Take 1 tablet (20 mEq total) by mouth daily. 30 tablet 6   sildenafil  (VIAGRA ) 100 MG tablet Take 1 tablet (100 mg total) by mouth daily as needed for erectile dysfunction. 10 tablet 5   SKYRIZI PEN 150 MG/ML pen Inject 150 mg into the skin every 3 (three) months.     No current facility-administered medications for this visit.    Allergies:   Patient has no known allergies.    Social History:  The patient  reports that he has quit smoking. His smoking use included cigarettes. He has a 2.5 pack-year smoking history. He has never used smokeless tobacco. He reports current alcohol use of about 4.0 standard drinks of alcohol per week. He reports that he does not use drugs.   Family History:  The patient's family history includes Cancer in his mother; Heart disease in his mother; Stroke in his mother.    ROS:  Please see the history of present illness.   Otherwise, review of systems are positive for none.   All other systems are reviewed and negative.    PHYSICAL EXAM: VS:  There were no vitals taken for this visit. , BMI There is no height or weight on file to calculate BMI.  GENERAL:  Well appearing WM in NAD HEENT:  PERRL, EOMI, sclera are clear. Oropharynx is clear. NECK:  No jugular venous distention, carotid upstroke brisk and symmetric, soft left subclavian bruit, no thyromegaly or adenopathy LUNGS:  Clear to auscultation bilaterally CHEST:  Unremarkable HEART:  RRR,   PMI not displaced or sustained,S1 and S2 within normal limits, no S3, no S4: no clicks, no rubs, very soft 1/6 diastolic murmur. ABD:  Soft, nontender. BS +, no masses or bruits. No hepatomegaly, no splenomegaly EXT:  2 + pulses throughout, tr edema, chronic venous  stasis changes. SKIN:  Warm and dry.  No rashes NEURO:  Alert and oriented x 3. Cranial nerves II through XII intact. PSYCH:  Cognitively intact   Recent Labs: 05/30/2024: ALT 18; BUN 14; Creatinine, Ser 1.07; Hemoglobin 13.6; Platelets 142; Potassium 4.0; Sodium 139  Dated 05/08/17: normal CMET and CBC Dated 09/13/21: normal CBC, CMET, Mg Dated 03/13/23: normal TSH Dated 04/01/23: normal CBC and CMET. Mag 2.2.  Lipid Panel    Component Value Date/Time   CHOL 116 05/30/2024 0303   CHOL 141 06/04/2023 1006   TRIG 60 05/30/2024 0303   HDL 62 05/30/2024 0303   HDL 64 06/04/2023 1006   CHOLHDL 1.9 05/30/2024 0303   VLDL 12 05/30/2024 0303   LDLCALC 42 05/30/2024 0303   LDLCALC 65 06/04/2023 1006          Wt Readings from Last 3 Encounters:  07/03/24 204 lb 9.6 oz (92.8 kg)  05/28/24 200 lb (90.7 kg)  05/18/24 204 lb 6 oz (92.7 kg)     Other studies Reviewed: Additional studies/ records that were reviewed today include: none.  ETT 03/06/17: Study Highlights     Blood pressure demonstrated a hypertensive response to exercise. Upsloping ST segment depression ST segment depression of 1 mm was noted during stress in the II, III, aVF, V4 and V5 leads, and returning to baseline after less than 1 minute of recovery.   1. Good exercise tolerance.  2. Upsloping ST depression in inferior leads and V4/V5.  This is nonspecific.    No evidence for ischemia.    Echo 07/08/17: Study Conclusions   - Left ventricle: The cavity size was normal. Wall thickness was   increased in a pattern of moderate LVH. Systolic function was   mildly to moderately reduced. The estimated ejection fraction was   in the range of 40% to 45%. - Aortic valve: There was moderate regurgitation. - Mitral valve: There was moderate regurgitation. - Left atrium: The atrium was moderately dilated. - Right atrium: The atrium was moderately dilated.  TEE 07/17/17: Study Conclusions   - Left ventricle: The  estimated ejection fraction was in the range   of 40% to 45%. Mild diffuse hypokinesis with no identifiable   regional variations. No evidence of thrombus. - Aortic valve: There was moderate perivalvular regurgitation. - Mitral valve: No evidence of vegetation. There was moderate to   severe regurgitation, with multiple jets. Diastolic regurgitation   was absent. - Left atrium: The atrium was moderately dilated. No evidence of   thrombus in the atrial cavity or appendage. No evidence of   thrombus in the atrial cavity or appendage. Emptying velocity was   reduced. - Right atrium: The atrium was moderately dilated. - Atrial septum: There was a medium-sized fenestrated patent   foramen ovale. A in the fossa ovalis region was present. There   was a moderate bidirectional shunt through a patent foramen   ovale, in the baseline state. - Tricuspid valve: No evidence of vegetation. There was moderate   regurgitation. - Pulmonic valve: No evidence of vegetation.   Impressions:   - Successful cardioversion. No cardiac source of emboli was   indentified.  Event monitor 07/25/17: Study Highlights   Atrial fibrillation Ventricular rates are frequently elevated Frequent nonsustained ventricular tachycardia as well as aberrantly conducted afib are note No sustained ventricular arrhythmias No prolonged pauses or AV block   Cardiac MRI 10/14/17: Cardiac MRI and thoracic MRA with and without contrast was performed on a 1.5 T MRI scanner to evaluate myocardial  morphology, function, viability and assess pulmonary vein anatomy in a patient with hx of GERD, HTN, hypercholesterolemia, OSA on CPAP, CAD s/p CABG (LIMA to LAD, svg to D1, sequential svg to OM and distal LCx, and svg to PDA), atrial flutter s/p ablation, atrial fibrillation, and wide complex tachycardia thought to due to SVT with aberrancy.  Transthoracic echo demonstrated an LVEF of 40-45%, normal cavity size, moderate MR, and moderate AR.   Transesophageal echo demonstrated moderate aortic regurgitation, moderate-severe mitral regurgitation, and a PFO.  Nuclear stress demonstrated a mildly dilated LV with no perfusion defect and an EF of 44%.  The patient is scheduled for ablation for atrial fibrillation.  Cardiac MRI 1. The left ventricle is moderately dilated.  Wall thickness is normal.  Regional and global LV systolic function are low normal.  The LVEF is calculated at 59%.  2. The right ventricle is normal in cavity size and wall thickness.  Global RV systolic function is low normal to mildly reduced.  The RVEF is calculated at 50%.  3. Both atria are severely enlarged.  4. The aortic valve is trileaflet in morphology. There is no significant aortic stenosis. There is moderate-severe aortic regurgitation.  The regurgitant orifice area measures 0.26 cm^2.  There is reversal of flow in the proximal descending thoracic aorta.  5.  There is  mild-moderate mitral regurgitation.  There is moderate tricuspid regurgitation.  There is mild pulmonic regurgitation.  6. Delayed enhancement imaging demonstrates no evidence of myocardial infarction, scarring,  or infiltration.  7.  There is no evidence of an intracardiac thrombus.  8.  The pericardium is normal in thickness.  There is no significant pericardial effusion.  Thoracic MRA 1. There are 3 right sided pulmonary veins.  The right lower and right middle pulmonary veins enter the LA immediately adjacent to one another. The right middle vein is small.  The 2 left sided pulmonary veins enter the LA normally.  All veins are patent without significant stenosis proximally.   MRA bi-orthogonal luminal dimensions are listed below: RUPV: 2.0 x 1.8 cm, 76 cm/sec RMPV: 0.6 x 0.5 cm RLPV: 2.0 x 1.7 cm, 60 cm/ sec LUPV: 1.9 x 1.0 cm, 59 cm/ sec LLPV: 1.9 x 0.9 cm, 60 cm/ sec  MRA LA dimensions Head-foot: 7.1 cm Right-left:  6.2 cm Anterior-posterior:  4.3 cm  2. The  thoracic aorta is normal in diameter. There is no evidence of a dissection flap.  3. The main and proximal branch pulmonary arteries are normal in size  4. Systemic venous connections are normal.  5. The esophagus is posterior of the left atrium in close proximity to the ostia of the left-sided pulmonary veins.  The descending thoracic aorta is behind the esophagus and behind the left sided pulmonary Veins.  Echo 10/16/17: INTERPRETATION ---------------------------------------------------------------   NORMAL LEFT VENTRICULAR SYSTOLIC FUNCTION WITH MILD LVH   NORMAL LA PRESSURES WITH DIASTOLIC DYSFUNCTION   NORMAL RIGHT VENTRICULAR SYSTOLIC FUNCTION   VALVULAR REGURGITATION: MODERATE AR, MILD MR, TRIVIAL PR, MILD TR   NO VALVULAR STENOSIS   IRREGULAR RHYTHM   Moderate to severe aortic regurgitation. Mild left ventricular enlargement   suggests color Doppler may underestimate aortic regurgitation severity.   AR VENA CONTRACTA=0.5CM   MILD TO MODERATE TR   NO PRIOR STUDY FOR COMPARISON  Echo 05/02/18: Study Conclusions   - Left ventricle: The cavity size was mildly dilated. Wall   thickness was increased in a pattern of mild LVH. Systolic   function was normal.  The estimated ejection fraction was in the   range of 50% to 55%. Wall motion was normal; there were no   regional wall motion abnormalities. Left ventricular diastolic   function parameters were normal. - Aortic valve: There was mild to moderate regurgitation. - Mitral valve: Calcified annulus. There was mild regurgitation. - Left atrium: The atrium was severely dilated. - Tricuspid valve: There was moderate regurgitation. - Pulmonary arteries: Systolic pressure was mildly increased. PA   peak pressure: 41 mm Hg (S).   Impressions:   - Normal LV systolic function; mild LVE and LVH; mild to moderate   AI; mild MR; severe LAE; moderate TR; mild pulmonary   hypertension.  Echo 03/26/19: INTERPRETATION  ---------------------------------------------------------------   NORMAL LEFT VENTRICULAR SYSTOLIC FUNCTION   NORMAL LA PRESSURES WITH DIASTOLIC DYSFUNCTION   NORMAL RIGHT VENTRICULAR SYSTOLIC FUNCTION   VALVULAR REGURGITATION: MODERATE AR, MILD MR, TRIVIAL PR, MILD TR   NO VALVULAR STENOSIS   AR VENA CONTRACTA=0.6CM   MILD TO MODERATE TR   NO PRIOR STUDY FOR COMPARISON   Echo 04/11/20: ECHOCARDIOGRAPHIC DESCRIPTIONS -----------------------------------------------  AORTIC ROOT          Size: MILDLY DILATED    Dissection: INDETERM FOR DISSECTION   AORTIC VALVE      Leaflets: Tricuspid             Morphology: MILDLY THICKENED      Mobility: Fully Mobile   LEFT VENTRICLE                                      Anterior: Normal          Size: MILDLY ENLARGED                        Lateral: Normal   Contraction: Normal                                  Septal: Normal    Closest EF: >55%(Estimated)  Calc.EF: 56% (3D)      Apical: Normal     LV masses: No Masses                             Inferior: Normal           LVH: MILD LVH                             Posterior: Normal   LV GLS(LOL): -18.0%   LV GLS(AVG): -16.6% Normal Range [ <= -16]  Dias.FxClass: N/A   MITRAL VALVE      Leaflets: Normal                  Mobility: Fully mobile    Morphology: Normal   LEFT ATRIUM          Size: MODERATELY ENLARGED     LA masses: No masses                Normal IAS   MAIN PA          Size: DILATED   PULMONIC VALVE    Morphology: Normal      Mobility: Fully Mobile   RIGHT VENTRICLE  Size: Normal                    Free wall: Normal   Contraction: Normal                    RV masses: No Masses         TAPSE:   2.1 cm,  Normal Range [>= 1.6 cm]       RV Note: ANNULAR VELOCITY=10cm/s   TRICUSPID VALVE      Leaflets: Normal                  Mobility: Fully mobile    Morphology: Normal   RIGHT ATRIUM          Size: MODERATELY ENLARGED        RA Other: None     RA masses: No  masses   PERICARDIUM         Fluid: No effusion   INFERIOR VENACAVA          Size: DILATED    Normal respiratory collapse   DOPPLER ECHO and OTHER SPECIAL PROCEDURES ------------------------------------     Aortic: MODERATE AR            No AS      Mitral: MILD MR                No MS     MV Inflow E Vel.= 70.0 cm/s  MV Annulus E'Vel.= 8.5 cm/s  E/E'Ratio= 8   Tricuspid: MILD TR                No TS             2.9 m/s peak TR vel   41 mmHg peak RV pressure   Pulmonary: TRIVIAL PR             No PS       Other:   INTERPRETATION ---------------------------------------------------------------    NORMAL LEFT VENTRICULAR SYSTOLIC FUNCTION WITH MILD LVH    NORMAL RIGHT VENTRICULAR SYSTOLIC FUNCTION    VALVULAR REGURGITATION: MODERATE AR, MILD MR, TRIVIAL PR, MILD TR    NO VALVULAR STENOSIS    MILDLY DILATED LEFT VENTRICLE SUGGESTS POSSIBILITY THAT ECHOCARDIOGRAPHY    UNDERESTIMATES AORTIC REGURGITATION SEVERITY.    AR VENA CONTRACTA=0.6cm    3D acquisition and reconstructions were performed as part of this    examination to more accurately quantify the effects of moderate or greater    valve regurgitation. (post-processing on an Independent workstation).     Compared with prior Echo study on 03/26/2019: NO SIGNIFICANT CHANGES.    Event monitor 12/07/20: Study Highlights  Atrial fibrillation with RVR and intermittent aberrancy. Patient had returned to NSR on 12/01/20    Echo 07/04/21: INTERPRETATION ---------------------------------------------------------------    NORMAL LEFT VENTRICULAR SYSTOLIC FUNCTION WITH MILD LVH    NORMAL RIGHT VENTRICULAR SYSTOLIC FUNCTION    VALVULAR REGURGITATION: MODERATE AR, MILD MR, MILD PR, MILD TR    NO VALVULAR STENOSIS    IRREGULAR RHYTHM THROUGHOUT EXAM    AR VENA CONTRACTA = 0.5 cm    3D acquisition and reconstructions were performed as part of this    examination to more accurately quantify the effects of identified    structural  abnormalities as part of the exam. (post-processing on an    Independent workstation).     Compared with prior Echo study on 04/11/2020: MILD TO MODERATE TR    SEVERE BIATRIAL ENLARGEMENT. INCREASED RV DIMENSIONS  NO SIGNIFICANT CHANGE IN AR OR LV FUNCTION    Cardiac MRI 09/11/21: SUMMARY   ==========================================================================================================   Cardiac MRI and thoracic MRA were performed  with and without contrast on a 3.0 T MRI scanner to evaluate  myocardial morphology, function, viability and pulmonary vein anatomy in a 74 y/o patient with hx of GERD,  HTN, hypercholesterolemia, OSA on CPAP, CAD s/p CABG (LIMA to LAD, svg to D1, sequential svg to OM and  distal LCx, and svg to PDA), atrial flutter s/p ablation, PFO, atrial fibrillation, wide complex  tachycardia thought to due to SVT with aberrancy, aortic regurgitation, and mitral regurgitation who is  scheduled for afib ablation.  Comparisons were made to the prior scan from 10/14/2017.   The cardiac rhythm was atrial fibrillation. Real time imaging was used.  Regurgitant valvular lesions could  not be fully assessed due to the use of real time imaging.  Comprehensive assessment could be performed  following conversion to NSR.   Cardiac MRI  1. The left ventricle is moderately dilated.  Wall thickness is normal.  There are no regional wall motion  abnormalities. During atrial fibrillation, there is low normal to mildly reduced function with an LVEF  visually estimated at 50-55%.   2. The right ventricle is normal in cavity size and wall thickness.  Global RV systolic function is low  normal to mildly reduced.   3. Both atria are severely enlarged.   4. The aortic valve is trileaflet in morphology. There is no significant aortic stenosis. There is at least  moderate-severe aortic regurgitation.   5.  There is at least mild-moderate mitral regurgitation.  There is at least  moderate tricuspid  regurgitation. There is mild pulmonic regurgitation.   6. Delayed enhancement imaging demonstrates no evidence of myocardial infarction, scarring,  or  infiltration.   7.  There is no evidence of an intracardiac thrombus.   8.  The pericardium is normal in thickness.  There is no significant pericardial effusion.   Thoracic MRA:  1. There are 3 right sided pulmonary veins.  The right lower and right middle pulmonary veins enter the LA  immediately adjacent to one another. The right middle vein is small.   The 2 left sided pulmonary veins enter the LA normally.   All veins are patent without significant stenosis proximally.   MRA bi-orthogonal luminal dimensions are listed below:  RUPV: 2.1 x 1.9 cm (prior 2.0 x 1.8 cm, 76 cm/sec)  RMPV: 0.7 x 0.6 cm (prior 0.6 x 0.5 cm)  RLPV: 2.1 x 1.7 cm (prior 2.0 x 1.7 cm, 60 cm/ sec)  LUPV: 1.9 x 1.1 cm (prior 1.9 x 1.0 cm, 59 cm/ sec)  LLPV: 1.7 x 1.0 cm (prior 1.9 x 0.9 cm, 60 cm/ sec)   MRA LA dimensions  Head-foot: 6.9 cm  Right-left: 6.8 cm  Anterior-posterior: 4.5 cm   2. The thoracic aorta is normal in diameter. There is no evidence of a dissection flap.   3. The main and proximal branch pulmonary arteries are normal in size   4. Systemic venous connections are normal.   5. The esophagus traverses posterior to ostia of the left-sided pulmonary veins.  The descending thoracic  aorta is behind the esophagus and behind the left sided pulmonary veins.   Echo 07/26/22:   INTERPRETATION ---------------------------------------------------------------    NORMAL LEFT VENTRICULAR SYSTOLIC FUNCTION WITH MILD LVH    NORMAL RIGHT VENTRICULAR SYSTOLIC FUNCTION    VALVULAR REGURGITATION: MODERATE AR, MILD MR,  TRIVIAL PR, MILD TR    NO VALVULAR STENOSIS     Compared with prior Echo study on 07/04/2021: MILD TO MODERATE TR. NO    SIGNIFICANT CHANGE IN AR OR LV SIZE/FUNCTION.   Echo 08/08/23: CONCLUSION  -------------------------------------------------------------------------------  MILD LEFT VENTRICULAR SYSTOLIC DYSFUNCTION WITH NO LVH  ESTIMATED EF: 45%, CALC EF(2D): 48%, CALC EF(3D): 47%  DIASTOLIC FUNCTION CAN'T BE DETERMINED  MILD RIGHT VENTRICULAR SYSTOLIC DYSFUNCTION  VALVULAR REGURGITATION: MILD AR, MODERATE MR, TRIVIAL PR, MILD TR  NO VALVULAR STENOSIS  PHYSICIAN IMPRESSIONS --------------------------------------------------------------------  MILD TO MODERATE AORTIC REGURGITATION  MILD TO MODERATE  MITRAL REGURGITAITON  MILD TO MODERATE TRICUSPID REGURGITATION  ESTIMATED LVEF 45%, ARRHTYMIA LIMITS ACCURACY OF LVEF ASSESSMENT  ESTIMATED RVSP =   Echo 05/30/24: IMPRESSIONS     1. Left ventricular ejection fraction, by estimation, is 60 to 65%. The  left ventricle has normal function. The left ventricle has no regional  wall motion abnormalities. The left ventricular internal cavity size was  mildly dilated. Left ventricular  diastolic parameters were normal.   2. Right ventricular systolic function is normal. The right ventricular  size is normal.   3. Left atrial size was moderately dilated.   4. Bubble study positive with bubbles appearing in L sided chambers only  after valsalva maneuver . Evidence of atrial level shunting detected by  color flow Doppler. Agitated saline contrast bubble study was positive  with shunting observed within 3-6  cardiac cycles suggestive of interatrial shunt.   5. Right atrial size was moderately dilated.   6. Mild mitral valve regurgitation.   7. Tricuspid valve regurgitation is mild to moderate.   8. The aortic valve is tricuspid. Aortic valve regurgitation is moderate.  Aortic valve sclerosis/calcification is present, without any evidence of  aortic stenosis.   9. The inferior vena cava is normal in size with greater than 50%  respiratory variability, suggesting right atrial pressure of 3 mmHg.    ASSESSMENT AND PLAN:  1.  Atrial fibriillation with RVR. Prior history of atrial flutter and atrial tachycardia ablation in 2015. On anticoagulation with Eliquis  5 mg bid. CHAD vasc score of 3. On  Toprol  XL.   Now s/p repeat Afib ablation at [facility] on 10/15/17 and again in Feb 2023. Recurrent Afib with DCCV in September and again in Jan. Not felt to be a candidate for Dofetilide due to prolonged QT. Per Dr Melchor  2. Coronary disease status post CABG in 2005. He is without chest pain. He had a normal Myoview  study September 2015. Low risk ETT in August 2018. Repeat nuclear  study at [facility] 2019 showed no ischemia. EF 44%. Will defer further evaluation unless he develops symptoms.  3. Chronic Aortic insufficiency- now mild to moderate.  Pulse pressure normal. No symptoms. Repeat Echo in Jan at [facility] showed no change.   4. 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 Last EF mildly reduced while in Afib.   5. Atrial flutter s/p ablation.   6. OSA- now on CPAP. Followed by Dr. Shlomo.  7. Hyperlipidemia on statin. Last LDL 65.   8. HTN - BP is under good control  9. Left subclavian stenosis. Asymptomatic.     Disposition: FU with me in 6 months  Signed, Calina Patrie, MD  07/31/2024 9:56 AM    [facility] "

## 2024-08-05 ENCOUNTER — Ambulatory Visit: Admitting: Cardiology

## 2024-09-10 ENCOUNTER — Ambulatory Visit: Admitting: Cardiology

## 2024-09-22 ENCOUNTER — Ambulatory Visit: Admitting: Neurology

## 2024-09-29 ENCOUNTER — Ambulatory Visit: Admitting: Neurology

## 2025-03-03 ENCOUNTER — Ambulatory Visit
